# Patient Record
Sex: Female | Born: 1960
Health system: Southern US, Community
[De-identification: ages and names within clinical notes are randomized; demographics above are authoritative.]

## PROBLEM LIST (undated history)

## (undated) DIAGNOSIS — R011 Cardiac murmur, unspecified: Secondary | ICD-10-CM

## (undated) DIAGNOSIS — C8290 Follicular lymphoma, unspecified, unspecified site: Secondary | ICD-10-CM

## (undated) DIAGNOSIS — R51 Headache: Secondary | ICD-10-CM

## (undated) DIAGNOSIS — K115 Sialolithiasis: Secondary | ICD-10-CM

## (undated) DIAGNOSIS — F17200 Nicotine dependence, unspecified, uncomplicated: Secondary | ICD-10-CM

## (undated) DIAGNOSIS — C8590 Non-Hodgkin lymphoma, unspecified, unspecified site: Secondary | ICD-10-CM

## (undated) DIAGNOSIS — M810 Age-related osteoporosis without current pathological fracture: Secondary | ICD-10-CM

## (undated) DIAGNOSIS — D369 Benign neoplasm, unspecified site: Secondary | ICD-10-CM

## (undated) DIAGNOSIS — R519 Headache, unspecified: Secondary | ICD-10-CM

## (undated) DIAGNOSIS — C801 Malignant (primary) neoplasm, unspecified: Secondary | ICD-10-CM

## (undated) DIAGNOSIS — Z8679 Personal history of other diseases of the circulatory system: Secondary | ICD-10-CM

## (undated) DIAGNOSIS — Z9884 Bariatric surgery status: Secondary | ICD-10-CM

## (undated) HISTORY — DX: Personal history of other diseases of the circulatory system: Z86.79

## (undated) HISTORY — DX: Headache, unspecified: R51.9

## (undated) HISTORY — DX: Sialolithiasis: K11.5

## (undated) HISTORY — PX: REDUCTION MAMMAPLASTY: SUR839

## (undated) HISTORY — DX: Headache: R51

## (undated) HISTORY — PX: CHOLECYSTECTOMY: SHX55

## (undated) HISTORY — PX: SALIVARY GLAND SURGERY: SHX768

## (undated) HISTORY — DX: Benign neoplasm, unspecified site: D36.9

## (undated) HISTORY — DX: Age-related osteoporosis without current pathological fracture: M81.0

## (undated) HISTORY — DX: Nicotine dependence, unspecified, uncomplicated: F17.200

## (undated) HISTORY — DX: Non-Hodgkin lymphoma, unspecified, unspecified site: C85.90

## (undated) HISTORY — PX: SALIVARY STONE REMOVAL: SHX5213

## (undated) HISTORY — PX: BILATERAL OOPHORECTOMY: SHX1221

## (undated) HISTORY — DX: Cardiac murmur, unspecified: R01.1

## (undated) HISTORY — PX: ABDOMINAL HYSTERECTOMY: SHX81

## (undated) HISTORY — DX: Bariatric surgery status: Z98.84

---
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
## 2002-01-31 DIAGNOSIS — Z9884 Bariatric surgery status: Secondary | ICD-10-CM | POA: Insufficient documentation

## 2002-01-31 HISTORY — DX: Bariatric surgery status: Z98.84

## 2002-01-31 HISTORY — PX: GASTRIC BYPASS: SHX52

## 2011-07-08 ENCOUNTER — Ambulatory Visit: Payer: Self-pay | Admitting: Family Medicine

## 2011-07-13 ENCOUNTER — Encounter: Payer: Self-pay | Admitting: Family Medicine

## 2011-07-13 ENCOUNTER — Ambulatory Visit (INDEPENDENT_AMBULATORY_CARE_PROVIDER_SITE_OTHER): Payer: BC Managed Care – PPO | Admitting: Family Medicine

## 2011-07-13 ENCOUNTER — Encounter: Payer: Self-pay | Admitting: *Deleted

## 2011-07-13 VITALS — BP 128/78 | HR 80 | Temp 98.7°F | Ht 66.0 in | Wt 178.8 lb

## 2011-07-13 DIAGNOSIS — Z9884 Bariatric surgery status: Secondary | ICD-10-CM | POA: Insufficient documentation

## 2011-07-13 DIAGNOSIS — Z0000 Encounter for general adult medical examination without abnormal findings: Secondary | ICD-10-CM

## 2011-07-13 DIAGNOSIS — F17200 Nicotine dependence, unspecified, uncomplicated: Secondary | ICD-10-CM

## 2011-07-13 DIAGNOSIS — Z87891 Personal history of nicotine dependence: Secondary | ICD-10-CM | POA: Insufficient documentation

## 2011-07-13 LAB — CBC WITH DIFFERENTIAL/PLATELET
Basophils Absolute: 0 10*3/uL (ref 0.0–0.1)
Basophils Relative: 0.4 % (ref 0.0–3.0)
Eosinophils Absolute: 0.1 10*3/uL (ref 0.0–0.7)
Eosinophils Relative: 1.5 % (ref 0.0–5.0)
HCT: 41.4 % (ref 36.0–46.0)
Hemoglobin: 13.7 g/dL (ref 12.0–15.0)
Lymphocytes Relative: 34.8 % (ref 12.0–46.0)
Lymphs Abs: 2 10*3/uL (ref 0.7–4.0)
MCHC: 33.1 g/dL (ref 30.0–36.0)
MCV: 88.3 fl (ref 78.0–100.0)
Monocytes Absolute: 0.4 10*3/uL (ref 0.1–1.0)
Monocytes Relative: 7.8 % (ref 3.0–12.0)
Neutro Abs: 3.1 10*3/uL (ref 1.4–7.7)
Neutrophils Relative %: 55.5 % (ref 43.0–77.0)
Platelets: 185 10*3/uL (ref 150.0–400.0)
RBC: 4.69 Mil/uL (ref 3.87–5.11)
RDW: 15.1 % — ABNORMAL HIGH (ref 11.5–14.6)
WBC: 5.7 10*3/uL (ref 4.5–10.5)

## 2011-07-13 LAB — COMPREHENSIVE METABOLIC PANEL
ALT: 28 U/L (ref 0–35)
AST: 25 U/L (ref 0–37)
Albumin: 4.1 g/dL (ref 3.5–5.2)
Alkaline Phosphatase: 77 U/L (ref 39–117)
BUN: 15 mg/dL (ref 6–23)
CO2: 30 mEq/L (ref 19–32)
Calcium: 9.4 mg/dL (ref 8.4–10.5)
Chloride: 104 mEq/L (ref 96–112)
Creatinine, Ser: 0.7 mg/dL (ref 0.4–1.2)
GFR: 96.71 mL/min (ref >60.00)
Glucose, Bld: 76 mg/dL (ref 70–99)
Potassium: 4.7 mEq/L (ref 3.5–5.1)
Sodium: 140 mEq/L (ref 135–145)
Total Bilirubin: 0.9 mg/dL (ref 0.3–1.2)
Total Protein: 6.6 g/dL (ref 6.0–8.3)

## 2011-07-13 LAB — LIPID PANEL
Cholesterol: 136 mg/dL (ref 0–200)
HDL: 58.7 mg/dL (ref >39.00)
LDL Cholesterol: 65 mg/dL (ref 0–99)
Total CHOL/HDL Ratio: 2
Triglycerides: 60 mg/dL (ref 0.0–149.0)
VLDL: 12 mg/dL (ref 0.0–40.0)

## 2011-07-13 LAB — FOLATE: Folate: 15 ng/mL (ref >5.9)

## 2011-07-13 LAB — IBC PANEL
Iron: 136 ug/dL (ref 42–145)
Saturation Ratios: 30.5 % (ref 20.0–50.0)
Transferrin: 318.1 mg/dL (ref 212.0–360.0)

## 2011-07-13 LAB — TSH: TSH: 1.67 u[IU]/mL (ref 0.35–5.50)

## 2011-07-13 LAB — VITAMIN B12: Vitamin B-12: 366 pg/mL (ref 211–911)

## 2011-07-13 LAB — FERRITIN: Ferritin: 5.2 ng/mL — ABNORMAL LOW (ref 10.0–291.0)

## 2011-07-13 NOTE — Assessment & Plan Note (Signed)
Discussed importance of cessation  

## 2011-07-13 NOTE — Patient Instructions (Signed)
Gusto concerla hoy. vamos a revisar sangre hoy. haga cita con gynecologo para exam de mujer. regrese cuando necesite o en 1 ano para proximo chequeo

## 2011-07-13 NOTE — Progress Notes (Signed)
Subjective:    Patient ID: Melinda Hall, female    DOB: March 18, 1960, 51 y.o.   MRN: [id number]  HPI CC: new pt to establish  No questions or concerns.  Feels well, good energy level.  Sleeps on average 5-6 hours nightly.  Not a lot of caffeine.  S/p gastric bypass.  Weight change from 278 to 170lbs.  Has vitamins at home, doesn't take.  Smoking - 3 cig/day.  Quit smoking for 4 yrs in past.  Contemplative.  Caffeine: 2 cups coffee/day Lives with husband Jeffie Pollock) and daughter, 3 dogs, 1 ferret Occupation: Geologist, engineering at daycare Activity: no regular exercise, walks some at work Diet: good water, fruits/vegetables daily  Preventative: Well woman with OBGYN.  Needs to establish. Physical 3 mo ago, did not have blood checked.  Had done at [facility].  Due for blood work, fasting today. Pap - 2012 mammo - 2011 normal Tetanus thinks about 5 yrs ago (2008)  Medications and allergies reviewed and updated in chart.  Past histories reviewed and updated if relevant as below. There is no problem list on file for this patient.  Past Medical History  Diagnosis Date  . Heart murmur   . Generalized headaches     frequent   Past Surgical History  Procedure Date  . Partial hysterectomy   . Cholecystectomy   . Gastric bypass   . Appendectomy    History  Substance Use Topics  . Smoking status: Current Everyday Smoker    Types: Cigarettes  . Smokeless tobacco: Not on file   Comment: 2-3 cigarettes daily  . Alcohol Use: No   No family history on file. No Known Allergies No current outpatient prescriptions on file prior to visit.     Review of Systems  Constitutional: Negative for fever, chills, activity change, appetite change, fatigue and unexpected weight change.  HENT: Negative for hearing loss and neck pain.   Eyes: Negative for visual disturbance.  Respiratory: Negative for cough, chest tightness, shortness of breath and wheezing.   Cardiovascular: Negative for chest pain,  palpitations and leg swelling.  Gastrointestinal: Negative for nausea, vomiting, abdominal pain, diarrhea, constipation, blood in stool and abdominal distention.  Genitourinary: Negative for hematuria and difficulty urinating.  Musculoskeletal: Negative for myalgias and arthralgias.  Skin: Negative for rash.  Neurological: Positive for headaches. Negative for dizziness, seizures and syncope.  Hematological: Does not bruise/bleed easily.  Psychiatric/Behavioral: Negative for dysphoric mood. The patient is nervous/anxious (very mild).        Objective:   Physical Exam  Nursing note and vitals reviewed. Constitutional: She is oriented to person, place, and time. She appears well-developed and well-nourished. No distress.  HENT:  Head: Normocephalic and atraumatic.  Right Ear: Hearing, tympanic membrane, external ear and ear canal normal.  Left Ear: Hearing, tympanic membrane, external ear and ear canal normal.  Nose: Nose normal.  Mouth/Throat: Oropharynx is clear and moist. No oropharyngeal exudate.  Eyes: Conjunctivae and EOM are normal. Pupils are equal, round, and reactive to light. No scleral icterus.  Neck: Normal range of motion. Neck supple. Carotid bruit is not present. No thyromegaly present.  Cardiovascular: Normal rate, regular rhythm, normal heart sounds and intact distal pulses.   No murmur (none appreciated today) heard. Pulses:      Radial pulses are 2+ on the right side, and 2+ on the left side.  Pulmonary/Chest: Effort normal and breath sounds normal. No respiratory distress. She has no wheezes. She has no rales.  Abdominal: Soft. Bowel sounds are  normal. She exhibits no distension and no mass. There is no tenderness. There is no rebound and no guarding.  Musculoskeletal: Normal range of motion. She exhibits no edema.  Lymphadenopathy:    She has no cervical adenopathy.  Neurological: She is alert and oriented to person, place, and time.       CN grossly intact,  station and gait intact  Skin: Skin is warm and dry. No rash noted.  Psychiatric: She has a normal mood and affect. Her behavior is normal. Judgment and thought content normal.       Assessment & Plan:

## 2011-07-13 NOTE — Assessment & Plan Note (Signed)
Due for blood work.  Check vitamin levels. Encouraged taking vitamins.

## 2011-07-13 NOTE — Assessment & Plan Note (Signed)
Recent CPE at [facility], however did not have labwork drawn.  Will check today as fasting.  To schedule well woman with OBGYN.

## 2011-07-14 ENCOUNTER — Other Ambulatory Visit: Payer: Self-pay | Admitting: Family Medicine

## 2011-07-14 LAB — VITAMIN D 25 HYDROXY (VIT D DEFICIENCY, FRACTURES): Vit D, 25-Hydroxy: 23 ng/mL — ABNORMAL LOW (ref 30–89)

## 2011-07-14 MED ORDER — FERROUS SULFATE 325 (65 FE) MG PO TABS: 325.0000 mg | ORAL_TABLET | Freq: Every day | ORAL | Status: DC

## 2011-07-14 MED ORDER — ERGOCALCIFEROL 1.25 MG (50000 UT) PO CAPS: 50000.0000 [IU] | ORAL_CAPSULE | ORAL | Status: DC

## 2012-01-17 ENCOUNTER — Ambulatory Visit (INDEPENDENT_AMBULATORY_CARE_PROVIDER_SITE_OTHER): Payer: BC Managed Care – PPO | Admitting: *Deleted

## 2012-01-17 DIAGNOSIS — Z23 Encounter for immunization: Secondary | ICD-10-CM

## 2012-02-24 ENCOUNTER — Other Ambulatory Visit (INDEPENDENT_AMBULATORY_CARE_PROVIDER_SITE_OTHER): Payer: BC Managed Care – PPO

## 2012-02-24 ENCOUNTER — Other Ambulatory Visit: Payer: Self-pay | Admitting: Family Medicine

## 2012-02-24 DIAGNOSIS — Z9884 Bariatric surgery status: Secondary | ICD-10-CM

## 2012-02-24 LAB — FERRITIN: Ferritin: 4.2 ng/mL — ABNORMAL LOW (ref 10.0–291.0)

## 2012-02-25 LAB — VITAMIN D 25 HYDROXY (VIT D DEFICIENCY, FRACTURES): Vit D, 25-Hydroxy: 24 ng/mL — ABNORMAL LOW (ref 30–89)

## 2012-02-28 ENCOUNTER — Encounter: Payer: Self-pay | Admitting: Family Medicine

## 2012-02-28 ENCOUNTER — Ambulatory Visit (INDEPENDENT_AMBULATORY_CARE_PROVIDER_SITE_OTHER): Payer: BC Managed Care – PPO | Admitting: Family Medicine

## 2012-02-28 VITALS — BP 110/74 | HR 60 | Temp 98.6°F | Ht 66.0 in | Wt 189.5 lb

## 2012-02-28 DIAGNOSIS — E611 Iron deficiency: Secondary | ICD-10-CM

## 2012-02-28 DIAGNOSIS — Z9884 Bariatric surgery status: Secondary | ICD-10-CM

## 2012-02-28 DIAGNOSIS — Z0000 Encounter for general adult medical examination without abnormal findings: Secondary | ICD-10-CM

## 2012-02-28 DIAGNOSIS — F17200 Nicotine dependence, unspecified, uncomplicated: Secondary | ICD-10-CM

## 2012-02-28 DIAGNOSIS — Z01419 Encounter for gynecological examination (general) (routine) without abnormal findings: Secondary | ICD-10-CM

## 2012-02-28 DIAGNOSIS — Z1211 Encounter for screening for malignant neoplasm of colon: Secondary | ICD-10-CM

## 2012-02-28 DIAGNOSIS — D509 Iron deficiency anemia, unspecified: Secondary | ICD-10-CM

## 2012-02-28 DIAGNOSIS — Z1231 Encounter for screening mammogram for malignant neoplasm of breast: Secondary | ICD-10-CM

## 2012-02-28 MED ORDER — ERGOCALCIFEROL 1.25 MG (50000 UT) PO CAPS: 50000.0000 [IU] | ORAL_CAPSULE | ORAL | Status: DC

## 2012-02-28 MED ORDER — FERROUS SULFATE 325 (65 FE) MG PO TABS: 325.0000 mg | ORAL_TABLET | Freq: Two times a day (BID) | ORAL | Status: DC

## 2012-02-28 MED ORDER — CHOLECALCIFEROL 50 MCG (2000 UT) PO CAPS: 1.0000 | ORAL_CAPSULE | Freq: Every day | ORAL | Status: DC

## 2012-02-28 NOTE — Assessment & Plan Note (Signed)
Without anemia. Recommend increased iron to bid. If remains with poor absorption, consider iron infusion vs heme.

## 2012-02-28 NOTE — Progress Notes (Signed)
Subjective:    Patient ID: Melinda Hall, female    DOB: 08-23-1960, 52 y.o.   MRN: [id number]  HPI CC: CPE  Having bilateral lateral hip joint pains, worse at night time, sometimes wakes her up from sleep.  Repositioning improves pain.  No pain with walking.  Smoking - a few a day.  Occasional depressed mood - does not want pharmacotherapy.  Trouble adjusting to [place] (prior had good friends in [place]).    Has had bone density stan 3-4 yrs ago and told normal. S/p gastric bypass 2005.  Taking iron 1 pill daily.  Wt Readings from Last 3 Encounters:  02/28/12 189 lb 8 oz (85.957 kg)  07/13/11 178 lb 12 oz (81.08 kg)    Caffeine: 2 cups coffee/day  Lives with husband Jeffie Pollock) and daughter, 3 dogs, 1 ferret Occupation: Geologist, engineering at daycare  Activity: no regular exercise, walks some at work  Diet: good water, fruits/vegetables daily   Preventative:  Well woman with OBGYN.  Would like to establish with spanish speaking OBGYN.  S/p bilateral oophorectomy for dermoid cysts. Pap - 2012  Colonoscopy - ifob requested. mammo - 2011 normal . Would like [place] to set up ([place]). Tetanus thinks about 5 yrs ago (2008)  Medications and allergies reviewed and updated in chart.  Past histories reviewed and updated if relevant as below. Patient Active Problem List  Diagnosis  . Status post gastric bypass for obesity  . Smoker  . Healthcare maintenance   Past Medical History  Diagnosis Date  . Heart murmur   . Generalized headaches     frequent  . Gastric bypass status for obesity 2004    s/p roux-en-y  . Smoker    Past Surgical History  Procedure Date  . Bilateral oophorectomy ~1992    for dermoid ovarian cysts  . Cholecystectomy 2000s  . Gastric bypass 2004    roux en y [facility])  . Appendectomy 1980   History  Substance Use Topics  . Smoking status: Current Every Day Smoker    Types: Cigarettes  . Smokeless tobacco: Never Used     Comment: 1-2  cigarettes daily  . Alcohol Use: No   Family History  Problem Relation Age of Onset  . Cancer Father 5    leukemia  . Hypertension Mother   . Diabetes Neg Hx   . Stroke Neg Hx   . Coronary artery disease Neg Hx    No Known Allergies Current Outpatient Prescriptions on File Prior to Visit  Medication Sig Dispense Refill  . ferrous sulfate 325 (65 FE) MG tablet Take 1 tablet (325 mg total) by mouth daily with breakfast.         Review of Systems  Constitutional: Negative for fever, chills, activity change, appetite change, fatigue and unexpected weight change.  HENT: Negative for hearing loss and neck pain.   Eyes: Negative for visual disturbance.  Respiratory: Negative for cough, chest tightness, shortness of breath and wheezing.   Cardiovascular: Negative for chest pain, palpitations and leg swelling.  Gastrointestinal: Negative for nausea, vomiting, abdominal pain, diarrhea, constipation, blood in stool and abdominal distention.  Genitourinary: Negative for hematuria and difficulty urinating.  Musculoskeletal: Negative for myalgias and arthralgias.  Skin: Negative for rash.  Neurological: Negative for dizziness, seizures, syncope and headaches.  Hematological: Does not bruise/bleed easily.  Psychiatric/Behavioral: Negative for dysphoric mood. The patient is not nervous/anxious.        Objective:   Physical Exam  Nursing note and vitals reviewed.  Constitutional: She is oriented to person, place, and time. She appears well-developed and well-nourished. No distress.  HENT:  Head: Normocephalic and atraumatic.  Right Ear: Hearing, tympanic membrane, external ear and ear canal normal.  Left Ear: Hearing, tympanic membrane, external ear and ear canal normal.  Nose: Nose normal. No mucosal edema or rhinorrhea. Right sinus exhibits no maxillary sinus tenderness and no frontal sinus tenderness. Left sinus exhibits no maxillary sinus tenderness and no frontal sinus tenderness.    Mouth/Throat: Uvula is midline, oropharynx is clear and moist and mucous membranes are normal. No oropharyngeal exudate, posterior oropharyngeal edema, posterior oropharyngeal erythema or tonsillar abscesses.  Eyes: Conjunctivae normal and EOM are normal. Pupils are equal, round, and reactive to light. No scleral icterus.  Neck: Normal range of motion. Neck supple. Carotid bruit is not present. No thyromegaly present.  Cardiovascular: Normal rate, regular rhythm, normal heart sounds and intact distal pulses.   No murmur heard. Pulses:      Radial pulses are 2+ on the right side, and 2+ on the left side.  Pulmonary/Chest: Effort normal and breath sounds normal. No respiratory distress. She has no wheezes. She has no rales.  Abdominal: Soft. Bowel sounds are normal. She exhibits no distension and no mass. There is no tenderness. There is no rebound and no guarding.  Musculoskeletal: Normal range of motion. She exhibits no edema.  Lymphadenopathy:    She has no cervical adenopathy.  Neurological: She is alert and oriented to person, place, and time.       CN grossly intact, station and gait intact  Skin: Skin is warm and dry. No rash noted.  Psychiatric: She has a normal mood and affect. Her behavior is normal. Judgment and thought content normal.       Assessment & Plan:

## 2012-02-28 NOTE — Assessment & Plan Note (Addendum)
Preventative protocols reviewed and updated unless pt declined. Discussed healthy diet and lifestyle. Refer to OBGYN for well woman, pt prefers spanish speaking, suggested Dr Lily Peer

## 2012-02-28 NOTE — Assessment & Plan Note (Signed)
With some iron def and vit d def. Recommend restart supplemnetation with vit D and increase iron to bid.

## 2012-02-28 NOTE — Assessment & Plan Note (Signed)
Again encouraged complete cessation.

## 2012-02-28 NOTE — Patient Instructions (Addendum)
Pass by [facility]'s office to establish with GYN for well woman (Dr. Lily Peer) and mammogram [place] por donde Shirlee Limerick para hacer cita con ginecologo y [place]. Empieze vitamina D 50,000 unidades semanales por 8 semanas, luego comineze vitamina D 2000 unidades diarias (over the counter) [place] cantidad de hierro a 1 [employer] a la semana. Gusto verla hoy, llamenos con preguntas.

## 2012-03-08 ENCOUNTER — Ambulatory Visit: Payer: BC Managed Care – PPO | Admitting: Gynecology

## 2012-03-20 ENCOUNTER — Ambulatory Visit: Payer: Self-pay | Admitting: Family Medicine

## 2012-03-27 ENCOUNTER — Encounter: Payer: Self-pay | Admitting: Family Medicine

## 2012-03-28 ENCOUNTER — Encounter: Payer: Self-pay | Admitting: *Deleted

## 2012-10-29 ENCOUNTER — Ambulatory Visit (INDEPENDENT_AMBULATORY_CARE_PROVIDER_SITE_OTHER): Payer: BC Managed Care – PPO | Admitting: Family Medicine

## 2012-10-29 ENCOUNTER — Encounter: Payer: Self-pay | Admitting: Family Medicine

## 2012-10-29 VITALS — BP 150/96 | HR 68 | Temp 98.6°F | Wt 191.2 lb

## 2012-10-29 DIAGNOSIS — R002 Palpitations: Secondary | ICD-10-CM

## 2012-10-29 DIAGNOSIS — Z23 Encounter for immunization: Secondary | ICD-10-CM

## 2012-10-29 DIAGNOSIS — I10 Essential (primary) hypertension: Secondary | ICD-10-CM

## 2012-10-29 NOTE — Patient Instructions (Signed)
EKG ok today. Blood work today as well as urine test today. We will call you with results. si todo sale normal, probablemente recomendare que vea cardiologo para monitor

## 2012-10-29 NOTE — Assessment & Plan Note (Signed)
Palpitations described as skipped heart beats and loud heart beats - ?arrhythmia. Checked EKG today - NSR rate 60s, normal axis, intervals, no hypertrophy or acute ST/T changes.  ?isolated T wave inversion Palpitations associated with hypertension, lightheadedness, and chills/ sweats.  Will check urine metanephrine to help r/o pheo. Check TSH, lytes and CBC. If unrevealing w/u, will refer to cards for discussion of event monitor to capture ?inciting arrythmia.  Pt agrees with plan. No meds started today.

## 2012-10-29 NOTE — Progress Notes (Signed)
  Subjective:    Patient ID: Melinda Hall, female    DOB: September 06, 1960, 52 y.o.   MRN: [id number]  HPI CC: malaise  Pleasant 52 yo s/p gastric bypass presents today with 3 wk h/o feeling ill - having episodes associated with palpitations, cold sweat, and shaking.  Palpitations described as occasional skipped beats (as measured by daughter who is firefighter) and stronger heart beats, not necessarily rapid heart beats.  Some dyspnea and dizziness associated with this.  No syncope with this.  This lasts 5-10 minutes then resolves.  H/o these episodes in years past, but recently much more frequent.  BP only slightly elevated when checked but hasn't checked specifically during these episodes.    Denies NSAID use. Drinks 2 cups coffee/day, no other caffeinated beverage (drinks decaf sweet tea).  Unsure if anxiety or stress related.  No h/o anxiety in past.  On increased stress at home or work. No h/o HTN. No chest pain, headaches with these episodes.  Some hip pain as well.  BP Readings from Last 3 Encounters:  10/29/12 150/96  02/28/12 110/74  07/13/11 128/78    Past Medical History  Diagnosis Date  . Heart murmur   . Generalized headaches     frequent  . Gastric bypass status for obesity 2004    s/p roux-en-y  . Smoker     Past Surgical History  Procedure Laterality Date  . Bilateral oophorectomy  ~1992    for dermoid ovarian cysts  . Cholecystectomy  2000s  . Gastric bypass  2004    roux en y [facility])  . Appendectomy  1980    Review of Systems Per HPI    Objective:   Physical Exam  Nursing note and vitals reviewed. Constitutional: She appears well-developed and well-nourished. No distress.  HENT:  Head: Normocephalic and atraumatic.  Mouth/Throat: Oropharynx is clear and moist. No oropharyngeal exudate.  Eyes: Conjunctivae and EOM are normal. Pupils are equal, round, and reactive to light.  Neck: Normal range of motion. Neck supple. Carotid bruit is not present. No  thyromegaly present.  Cardiovascular: Normal rate, regular rhythm, normal heart sounds and intact distal pulses.   No murmur heard. No murmur appreciated today  Pulmonary/Chest: Effort normal and breath sounds normal. No respiratory distress. She has no wheezes. She has no rales.  Musculoskeletal: She exhibits no edema.  Lymphadenopathy:    She has no cervical adenopathy.  Psychiatric: She has a normal mood and affect.       Assessment & Plan:

## 2012-10-30 ENCOUNTER — Encounter: Payer: Self-pay | Admitting: *Deleted

## 2012-10-30 LAB — CBC WITH DIFFERENTIAL/PLATELET
Basophils Absolute: 0 10*3/uL (ref 0.0–0.1)
Basophils Relative: 0.4 % (ref 0.0–3.0)
Eosinophils Absolute: 0.2 10*3/uL (ref 0.0–0.7)
Eosinophils Relative: 2 % (ref 0.0–5.0)
HCT: 38 % (ref 36.0–46.0)
Hemoglobin: 12.6 g/dL (ref 12.0–15.0)
Lymphocytes Relative: 31.3 % (ref 12.0–46.0)
Lymphs Abs: 2.4 10*3/uL (ref 0.7–4.0)
MCHC: 33.1 g/dL (ref 30.0–36.0)
MCV: 84.9 fl (ref 78.0–100.0)
Monocytes Absolute: 0.5 10*3/uL (ref 0.1–1.0)
Monocytes Relative: 6 % (ref 3.0–12.0)
Neutro Abs: 4.5 10*3/uL (ref 1.4–7.7)
Neutrophils Relative %: 60.3 % (ref 43.0–77.0)
Platelets: 231 10*3/uL (ref 150.0–400.0)
RBC: 4.48 Mil/uL (ref 3.87–5.11)
RDW: 15.4 % — ABNORMAL HIGH (ref 11.5–14.6)
WBC: 7.5 10*3/uL (ref 4.5–10.5)

## 2012-10-30 LAB — COMPREHENSIVE METABOLIC PANEL
ALT: 23 U/L (ref 0–35)
AST: 22 U/L (ref 0–37)
Albumin: 4 g/dL (ref 3.5–5.2)
Alkaline Phosphatase: 71 U/L (ref 39–117)
BUN: 15 mg/dL (ref 6–23)
CO2: 28 mEq/L (ref 19–32)
Calcium: 9.2 mg/dL (ref 8.4–10.5)
Chloride: 106 mEq/L (ref 96–112)
Creatinine, Ser: 0.7 mg/dL (ref 0.4–1.2)
GFR: 101.36 mL/min (ref >60.00)
Glucose, Bld: 83 mg/dL (ref 70–99)
Potassium: 4.7 mEq/L (ref 3.5–5.1)
Sodium: 138 mEq/L (ref 135–145)
Total Bilirubin: 0.6 mg/dL (ref 0.3–1.2)
Total Protein: 7 g/dL (ref 6.0–8.3)

## 2012-10-30 LAB — TSH: TSH: 2.24 u[IU]/mL (ref 0.35–5.50)

## 2012-10-30 NOTE — Addendum Note (Signed)
Addended by: Annamarie Major on: 10/30/2012 12:19 PM   Modules accepted: Orders

## 2012-11-02 ENCOUNTER — Telehealth: Payer: Self-pay

## 2012-11-02 NOTE — Telephone Encounter (Signed)
pts husband called to request 10/30/12 lab results. Mr Farewell notified as instructed from lab result note and pt will receive letter about lab results.Mr Hobart voiced understanding.

## 2012-11-05 ENCOUNTER — Other Ambulatory Visit (INDEPENDENT_AMBULATORY_CARE_PROVIDER_SITE_OTHER): Payer: BC Managed Care – PPO

## 2012-11-05 DIAGNOSIS — R002 Palpitations: Secondary | ICD-10-CM

## 2012-11-05 DIAGNOSIS — I10 Essential (primary) hypertension: Secondary | ICD-10-CM

## 2012-11-10 LAB — METANEPHRINES, URINE, 24 HOUR
Metaneph Total, Ur: 562 mcg/24 h (ref 224–832)
Metanephrines, Ur: 37 mcg/24 h — ABNORMAL LOW (ref 90–315)
Normetanephrine, 24H Ur: 525 mcg/24 h (ref 122–676)

## 2012-11-11 ENCOUNTER — Other Ambulatory Visit: Payer: Self-pay | Admitting: Family Medicine

## 2012-11-11 DIAGNOSIS — R002 Palpitations: Secondary | ICD-10-CM

## 2012-11-23 ENCOUNTER — Encounter: Payer: Self-pay | Admitting: Cardiovascular Disease

## 2012-11-23 ENCOUNTER — Ambulatory Visit (INDEPENDENT_AMBULATORY_CARE_PROVIDER_SITE_OTHER): Payer: BC Managed Care – PPO | Admitting: Cardiovascular Disease

## 2012-11-23 VITALS — BP 110/82 | HR 55 | Ht 66.0 in | Wt 188.5 lb

## 2012-11-23 DIAGNOSIS — R55 Syncope and collapse: Secondary | ICD-10-CM

## 2012-11-23 DIAGNOSIS — F17200 Nicotine dependence, unspecified, uncomplicated: Secondary | ICD-10-CM

## 2012-11-23 DIAGNOSIS — R000 Tachycardia, unspecified: Secondary | ICD-10-CM

## 2012-11-23 DIAGNOSIS — R002 Palpitations: Secondary | ICD-10-CM

## 2012-11-23 NOTE — Assessment & Plan Note (Addendum)
Etiology of her symptomatic episodes concerning for arrhythmia. We have discussed various arrhythmias with her. As her rhythm may be more regular as she describes, less likely atrial fibrillation though not definitive . Symptoms come on acutely, resolve acutely. Lasting 10-30 minutes. Been on a regular basis. We have suggested she wear a 30 day monitor. I suspect a 48 hour monitor would likely miss her arrhythmia. It may be a challenge to treat given her current bradycardia at baseline. If she has episodes for the monitor is in place, we have suggested she go to the EMTs or call 911 for EKG. clinically, no suggestion of pathology. I'm concerned about minor EKG abnormality in the inferior leads. No symptoms at baseline

## 2012-11-23 NOTE — Patient Instructions (Signed)
You are doing well. No medication changes were made.  We will order a 30 day monitor. This will be sent to your house if approved.  Please call us if you have new issues that need to be addressed before your next appt.  Your physician wants you to follow-up in: 6 weeks

## 2012-11-23 NOTE — Progress Notes (Signed)
Patient ID: Melinda Hall, female    DOB: [birth year], 52 y.o.   MRN: [id number]  HPI Comments: Melinda Hall is a 52 year old woman with long smoking history for may 13 to age 74, less than one half pack per day who presents by referral from Dr. Sharen Hones with symptoms of palpitations, weakness, lightheadedness associated with cold sweats, near syncope.  She reports that she was doing well until 3 or 4 weeks ago. She was sitting watching TV when she had acute onset of pounding heart rate. Symptoms lasted for 30 minutes, she was very symptomatic. She describes it as a fast, heart rhythm. Heart rates at one point were measured at 80 beats per minute but she could not be sure it was not faster. She had some lightheadedness, felt very weak. Also had some cold sweats. Symptoms resolved acutely as fast as they have come on after 30 minutes.  She had a second and third episode one was in a car while she was sitting lasting for 10 minutes. Acute onset again associated with weakness, lightheadedness, cold sweats.  Third episode lasting 5 or 10 minutes while she was standing. She had to sit down as she was very lightheaded/near syncope and felt very weak. All 3 episodes came on acutely and resolved acutely. Each episode seemed to be associated with a pounding heart rate which felt very strong, regular. She did not feel that they were irregular.  Otherwise she has felt well since then. Active, no complaints. No chest pain or shortness of breath.  EKG today shows sinus bradycardia with rate 55 beats a minute with ST and T wave abnormality in leads 3, aVF (seen as well on recent readings EKG)    Outpatient Encounter Prescriptions as of 11/23/2012  Medication Sig Dispense Refill  . Cholecalciferol (HM VITAMIN D3) 2000 UNITS CAPS Take 1 capsule (2,000 Units total) by mouth daily.  30 each    . ferrous sulfate 325 (65 FE) MG tablet Take 1 tablet (325 mg total) by mouth 2 (two) times daily.       No  facility-administered encounter medications on file as of 11/23/2012.     Review of Systems  Constitutional:       Diaphoresis, heart pounding associated with her episodes with near syncope  HENT: Negative.   Eyes: Negative.   Respiratory: Negative.   Cardiovascular: Positive for palpitations.  Gastrointestinal: Negative.   Endocrine: Negative.   Musculoskeletal: Negative.   Skin: Negative.   Allergic/Immunologic: Negative.   Neurological: Positive for weakness.  Hematological: Negative.   Psychiatric/Behavioral: Negative.   All other systems reviewed and are negative.    BP 110/82  Pulse 55  Ht 5\' 6"  (1.676 m)  Wt 188 lb 8 oz (85.503 kg)  BMI 30.44 kg/m2   Physical Exam  Nursing note and vitals reviewed. Constitutional: She is oriented to person, place, and time. She appears well-developed and well-nourished.  HENT:  Head: Normocephalic.  Nose: Nose normal.  Mouth/Throat: Oropharynx is clear and moist.  Eyes: Conjunctivae are normal. Pupils are equal, round, and reactive to light.  Neck: Normal range of motion. Neck supple. No JVD present.  Cardiovascular: Normal rate, regular rhythm, S1 normal, S2 normal, normal heart sounds and intact distal pulses.  Exam reveals no gallop and no friction rub.   No murmur heard. Pulmonary/Chest: Effort normal and breath sounds normal. No respiratory distress. She has no wheezes. She has no rales. She exhibits no tenderness.  Abdominal: Soft. Bowel sounds are normal. She exhibits  no distension. There is no tenderness.  Musculoskeletal: Normal range of motion. She exhibits no edema and no tenderness.  Lymphadenopathy:    She has no cervical adenopathy.  Neurological: She is alert and oriented to person, place, and time. Coordination normal.  Skin: Skin is warm and dry. No rash noted. No erythema.  Psychiatric: She has a normal mood and affect. Her behavior is normal. Judgment and thought content normal.    Assessment and Plan

## 2012-11-23 NOTE — Assessment & Plan Note (Signed)
We have encouraged her to continue to work on weaning her cigarettes and smoking cessation. She will continue to work on this and does not want any assistance with chantix.  

## 2012-11-28 DIAGNOSIS — R002 Palpitations: Secondary | ICD-10-CM

## 2013-01-04 ENCOUNTER — Ambulatory Visit (INDEPENDENT_AMBULATORY_CARE_PROVIDER_SITE_OTHER): Payer: BC Managed Care – PPO | Admitting: Cardiovascular Disease

## 2013-01-04 ENCOUNTER — Encounter: Payer: Self-pay | Admitting: Cardiovascular Disease

## 2013-01-04 VITALS — BP 100/80 | HR 52 | Ht 66.0 in | Wt 189.5 lb

## 2013-01-04 DIAGNOSIS — F17200 Nicotine dependence, unspecified, uncomplicated: Secondary | ICD-10-CM

## 2013-01-04 DIAGNOSIS — R002 Palpitations: Secondary | ICD-10-CM

## 2013-01-04 DIAGNOSIS — R0602 Shortness of breath: Secondary | ICD-10-CM

## 2013-01-04 DIAGNOSIS — Z9884 Bariatric surgery status: Secondary | ICD-10-CM

## 2013-01-04 MED ORDER — PROPRANOLOL HCL 10 MG PO TABS: 10.0000 mg | ORAL_TABLET | Freq: Three times a day (TID) | ORAL | Status: DC | PRN

## 2013-01-04 NOTE — Assessment & Plan Note (Signed)
We have congratulated her on smoking cessation

## 2013-01-04 NOTE — Patient Instructions (Signed)
You are doing well. Please take propranolol as needed for palpitations You are having premature extra beats (atrial beats) These will not hurt you, just bothersome  Please call us if you have new issues that need to be addressed before your next appt.

## 2013-01-04 NOTE — Assessment & Plan Note (Signed)
We have encouraged continued exercise, careful diet management in an effort to lose weight. 

## 2013-01-04 NOTE — Assessment & Plan Note (Signed)
a rare APCs seen on 30 day monitor. She is symptomatic at times, not daily. Sometimes once per month. We will give her propranolol 10 mg to take when necessary for symptoms

## 2013-01-04 NOTE — Progress Notes (Signed)
   Patient ID: Melinda Hall, female    DOB: [birth year], 52 y.o.   MRN: [id number]  HPI Comments: Melinda Hall is a 52 year old woman with long smoking history from age 64 to age 34, less than one half pack per day who initially presented with  symptoms of palpitations, weakness, lightheadedness associated with cold sweats, near syncope.She presents for routine followup.  She states that since her last clinic visit, she has stopped smoking, approximately 3 weeks ago. Her symptoms of weakness, lightheadedness, cold sweats have significantly improved. She wore a 30 day monitor. No significant lightheaded or near syncope spells. 30 day monitor showed normal sinus rhythm with no significant arrhythmia, rare APCs.  Overall she is in good spirits with no significant complaints. She would like to start a regular exercise program. He denies any significant shortness of breath or chest discomfort.   EKG today shows sinus bradycardia with rate 52 beats a minute with ST and T wave abnormality in leads 3, aVF (seen as well on recent readings EKG)    Outpatient Encounter Prescriptions as of 01/04/2013  Medication Sig  . Cholecalciferol (HM VITAMIN D3) 2000 UNITS CAPS Take 1 capsule (2,000 Units total) by mouth daily.  . ferrous sulfate 325 (65 FE) MG tablet Take 1 tablet (325 mg total) by mouth 2 (two) times daily.     Review of Systems  HENT: Negative.   Eyes: Negative.   Respiratory: Negative.   Cardiovascular: Positive for palpitations.  Gastrointestinal: Negative.   Endocrine: Negative.   Musculoskeletal: Negative.   Skin: Negative.   Allergic/Immunologic: Negative.   Hematological: Negative.   Psychiatric/Behavioral: Negative.   All other systems reviewed and are negative.    BP 100/80  Pulse 52  Ht 5\' 6"  (1.676 m)  Wt 189 lb 8 oz (85.957 kg)  BMI 30.60 kg/m2   Physical Exam  Nursing note and vitals reviewed. Constitutional: She is oriented to person, place, and time. She appears  well-developed and well-nourished.  HENT:  Head: Normocephalic.  Nose: Nose normal.  Mouth/Throat: Oropharynx is clear and moist.  Eyes: Conjunctivae are normal. Pupils are equal, round, and reactive to light.  Neck: Normal range of motion. Neck supple. No JVD present.  Cardiovascular: Normal rate, regular rhythm, S1 normal, S2 normal, normal heart sounds and intact distal pulses.  Exam reveals no gallop and no friction rub.   No murmur heard. Pulmonary/Chest: Effort normal and breath sounds normal. No respiratory distress. She has no wheezes. She has no rales. She exhibits no tenderness.  Abdominal: Soft. Bowel sounds are normal. She exhibits no distension. There is no tenderness.  Musculoskeletal: Normal range of motion. She exhibits no edema and no tenderness.  Lymphadenopathy:    She has no cervical adenopathy.  Neurological: She is alert and oriented to person, place, and time. Coordination normal.  Skin: Skin is warm and dry. No rash noted. No erythema.  Psychiatric: She has a normal mood and affect. Her behavior is normal. Judgment and thought content normal.    Assessment and Plan

## 2013-01-07 ENCOUNTER — Encounter (INDEPENDENT_AMBULATORY_CARE_PROVIDER_SITE_OTHER): Payer: BC Managed Care – PPO

## 2013-01-07 DIAGNOSIS — R000 Tachycardia, unspecified: Secondary | ICD-10-CM

## 2013-01-07 DIAGNOSIS — R55 Syncope and collapse: Secondary | ICD-10-CM

## 2013-01-07 DIAGNOSIS — R002 Palpitations: Secondary | ICD-10-CM

## 2013-02-25 ENCOUNTER — Encounter: Payer: Self-pay | Admitting: Family Medicine

## 2013-02-25 ENCOUNTER — Ambulatory Visit (INDEPENDENT_AMBULATORY_CARE_PROVIDER_SITE_OTHER): Payer: BC Managed Care – PPO | Admitting: Family Medicine

## 2013-02-25 ENCOUNTER — Encounter: Payer: Self-pay | Admitting: *Deleted

## 2013-02-25 VITALS — BP 118/82 | HR 76 | Temp 98.6°F | Wt 189.5 lb

## 2013-02-25 DIAGNOSIS — I889 Nonspecific lymphadenitis, unspecified: Secondary | ICD-10-CM | POA: Insufficient documentation

## 2013-02-25 MED ORDER — NAPROXEN 500 MG PO TABS: ORAL_TABLET | ORAL | Status: DC

## 2013-02-25 MED ORDER — AMOXICILLIN-POT CLAVULANATE 875-125 MG PO TABS: 1.0000 | ORAL_TABLET | Freq: Two times a day (BID) | ORAL | Status: AC

## 2013-02-25 NOTE — Assessment & Plan Note (Signed)
Anticipate infectious.  Treat with augmentin course x 1 wk, NSAID, and warm compresses. Update if persistent or worsening swelling. Low smoking PY history.

## 2013-02-25 NOTE — Patient Instructions (Signed)
Creo que tiene infeccion de una glandula de su garganta. Normalmente esto es causado por infeccion - tome augmentin dos veces al dia por 7 dias, y use naporsyn cuando necesite para inflamacion. si glandula persiste mas de 4 semanas, dejeme saber.  Cervical Adenitis You have a swollen lymph gland in your neck. This commonly happens with Strep and virus infections, dental problems, insect bites, and injuries about the face, scalp, or neck. The lymph glands swell as the body fights the infection or heals the injury. Swelling and firmness typically lasts for several weeks after the infection or injury is healed. Rarely lymph glands can become swollen because of cancer or TB. Antibiotics are prescribed if there is evidence of an infection. Sometimes an infected lymph gland becomes filled with pus. This condition may require opening up the abscessed gland by draining it surgically. Most of the time infected glands return to normal within two weeks. Do not poke or squeeze the swollen lymph nodes. That may keep them from shrinking back to their normal size. If the lymph gland is still swollen after 2 weeks, further medical evaluation is needed.  SEEK IMMEDIATE MEDICAL CARE IF:  You have difficulty swallowing or breathing, increased swelling, severe pain, or a high fever.  Document Released: 01/17/2005 Document Revised: 04/11/2011 Document Reviewed: 07/09/2006 [facility] Patient Information 2014 [place].

## 2013-02-25 NOTE — Progress Notes (Signed)
Pre-visit discussion using our clinic review tool. No additional management support is needed unless otherwise documented below in the visit note.  

## 2013-02-25 NOTE — Progress Notes (Signed)
   Subjective:    Patient ID: Melinda Hall, female    DOB: 05/13/1960, 53 y.o.   MRN: [id number]  HPI CC: R neck swelling  3d h/o swelling in R neck.noticed when she turned her head to the left.  Evaluated on Friday at [facility].  Told possible infection, rec warm compresses.  Actually getting worse.  Worsening pain, worsening trouble swallowing.  Denies malaise - no ear pain, no neck pain, no congestion.  No fever.  No tooth pain. Taking husband's naprosyn for inflammation which helps pain and allows her to swallow easier. Trouble eating solids 2/2 pain with opening mouth and swallowing.  Last saw dentist 6 months ago.   No cats at home. No other lymphadenopathy. 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  Quit smoking 3 mo ago, prior smoking only 2-3 cigarettes per day. No EtOH.  Wt Readings from Last 3 Encounters:  02/25/13 189 lb 8 oz (85.957 kg)  01/04/13 189 lb 8 oz (85.957 kg)  11/23/12 188 lb 8 oz (85.503 kg)    Past Medical History  Diagnosis Date  . History of cardiac murmur   . Generalized headaches     frequent  . Gastric bypass status for obesity 2004    s/p roux-en-y  . Smoker   . Heart murmur      Review of Systems Per HPI    Objective:   Physical Exam  Nursing note and vitals reviewed. Constitutional: She appears well-developed and well-nourished. No distress.  HENT:  Head: Normocephalic and atraumatic.  Right Ear: Hearing, tympanic membrane, external ear and ear canal normal.  Left Ear: Hearing, tympanic membrane, external ear and ear canal normal.  Nose: Nose normal. No mucosal edema or rhinorrhea. Right sinus exhibits no maxillary sinus tenderness and no frontal sinus tenderness. Left sinus exhibits no maxillary sinus tenderness and no frontal sinus tenderness.  Mouth/Throat: Uvula is midline, oropharynx is clear and moist and mucous membranes are normal. No oropharyngeal exudate, posterior oropharyngeal edema, posterior oropharyngeal erythema or  tonsillar abscesses.  Eyes: Conjunctivae and EOM are normal. Pupils are equal, round, and reactive to light. No scleral icterus.  Neck: Normal range of motion. Neck supple.  Abdominal: Soft. Normal appearance and bowel sounds are normal. She exhibits no distension and no mass. There is no hepatosplenomegaly. There is no tenderness. There is no rigidity, no rebound, no guarding and negative Murphy's sign.  Musculoskeletal: She exhibits no edema.  Lymphadenopathy:       Head (right side): Submandibular (subparotid, large, tender about 2cm) adenopathy present. No submental, no tonsillar, no preauricular and no posterior auricular adenopathy present.       Head (left side): No submental, no submandibular, no tonsillar, no preauricular and no posterior auricular adenopathy present.    She has cervical adenopathy (shotty).    She has no axillary adenopathy.       Right axillary: No lateral adenopathy present.       Left axillary: No lateral adenopathy present.      Right: No inguinal and no supraclavicular adenopathy present.       Left: No inguinal and no supraclavicular adenopathy present.  Skin: Skin is warm and dry. No rash noted.       Assessment & Plan:

## 2013-02-26 ENCOUNTER — Emergency Department: Payer: Self-pay | Admitting: Emergency Medicine

## 2013-02-26 ENCOUNTER — Telehealth: Payer: Self-pay | Admitting: Family Medicine

## 2013-02-26 NOTE — Telephone Encounter (Signed)
Pt husband stopped by office to say the pain medication prescribed yesterday is not working and she is still in a lot of pain. Is there anything else that can be prescribed. Please advise. Pt husband request c/b

## 2013-02-26 NOTE — Telephone Encounter (Signed)
Called and left message on pt and husband's answering machine. Ok to extend work note. Ok to try different pain med, but prior plz get following information - any fever, spreading redness or enlarging of gland?

## 2013-02-26 NOTE — Telephone Encounter (Signed)
Spoke with husband.  Pt was seen at ER this afternoon and had oral abscess I&D'd.  Pt was upset that he was not able to get me a message this morning.   He states he's had several experiences where he is only able to get "no" when talking with front office staff. I would have appreciated to receive this message sooner than 1pm on a patient I saw the day before who was worsening. I will route to Adrienne to be aware.

## 2013-02-26 NOTE — Telephone Encounter (Signed)
Patient also requested that work note be extended. Ok to do?

## 2013-02-26 NOTE — Telephone Encounter (Signed)
Apparently husband came back into and wanted to know why we hadn't called back. It was explained to him that you were still seeing your morning patients and as soon as you finished seeing them, you would look at the note and I would call them back. He did not like that answer and stormed out saying he would just take her to the ER.

## 2013-02-28 ENCOUNTER — Emergency Department: Payer: Self-pay | Admitting: Internal Medicine

## 2013-02-28 LAB — BASIC METABOLIC PANEL
Anion Gap: 4 — ABNORMAL LOW (ref 7–16)
BUN: 13 mg/dL (ref 7–18)
Calcium, Total: 9.2 mg/dL (ref 8.5–10.1)
Chloride: 104 mmol/L (ref 98–107)
Co2: 30 mmol/L (ref 21–32)
Creatinine: 0.67 mg/dL (ref 0.60–1.30)
EGFR (African American): >60
EGFR (Non-African Amer.): >60
Glucose: 81 mg/dL (ref 65–99)
Osmolality: 275 (ref 275–301)
Potassium: 3.7 mmol/L (ref 3.5–5.1)
Sodium: 138 mmol/L (ref 136–145)

## 2013-02-28 LAB — CBC WITH DIFFERENTIAL/PLATELET
Basophil #: 0.1 10*3/uL (ref 0.0–0.1)
Basophil %: 0.8 %
Eosinophil #: 0.1 10*3/uL (ref 0.0–0.7)
Eosinophil %: 1.9 %
HCT: 38.3 % (ref 35.0–47.0)
HGB: 12.9 g/dL (ref 12.0–16.0)
Lymphocyte #: 1.7 10*3/uL (ref 1.0–3.6)
Lymphocyte %: 23 %
MCH: 28.4 pg (ref 26.0–34.0)
MCHC: 33.6 g/dL (ref 32.0–36.0)
MCV: 85 fL (ref 80–100)
Monocyte #: 0.7 x10 3/mm (ref 0.2–0.9)
Monocyte %: 9 %
Neutrophil #: 4.7 10*3/uL (ref 1.4–6.5)
Neutrophil %: 65.3 %
Platelet: 218 10*3/uL (ref 150–440)
RBC: 4.53 10*6/uL (ref 3.80–5.20)
RDW: 15.2 % — ABNORMAL HIGH (ref 11.5–14.5)
WBC: 7.3 10*3/uL (ref 3.6–11.0)

## 2013-03-05 ENCOUNTER — Ambulatory Visit: Payer: Self-pay | Admitting: Otolaryngology

## 2013-03-07 ENCOUNTER — Ambulatory Visit: Payer: Self-pay | Admitting: Otolaryngology

## 2013-03-13 LAB — PATHOLOGY REPORT

## 2013-06-06 ENCOUNTER — Ambulatory Visit (INDEPENDENT_AMBULATORY_CARE_PROVIDER_SITE_OTHER): Payer: BC Managed Care – PPO | Admitting: Family Medicine

## 2013-06-06 ENCOUNTER — Encounter: Payer: Self-pay | Admitting: Family Medicine

## 2013-06-06 VITALS — BP 142/84 | HR 80 | Temp 98.2°F | Wt 191.2 lb

## 2013-06-06 DIAGNOSIS — E66811 Obesity, class 1: Secondary | ICD-10-CM | POA: Insufficient documentation

## 2013-06-06 DIAGNOSIS — M7071 Other bursitis of hip, right hip: Secondary | ICD-10-CM | POA: Insufficient documentation

## 2013-06-06 DIAGNOSIS — M25559 Pain in unspecified hip: Secondary | ICD-10-CM

## 2013-06-06 DIAGNOSIS — E669 Obesity, unspecified: Secondary | ICD-10-CM

## 2013-06-06 DIAGNOSIS — M25552 Pain in left hip: Secondary | ICD-10-CM

## 2013-06-06 DIAGNOSIS — M7072 Other bursitis of hip, left hip: Secondary | ICD-10-CM

## 2013-06-06 DIAGNOSIS — M25551 Pain in right hip: Secondary | ICD-10-CM

## 2013-06-06 DIAGNOSIS — E663 Overweight: Secondary | ICD-10-CM | POA: Insufficient documentation

## 2013-06-06 DIAGNOSIS — J019 Acute sinusitis, unspecified: Secondary | ICD-10-CM

## 2013-06-06 MED ORDER — AMOXICILLIN-POT CLAVULANATE 875-125 MG PO TABS: 1.0000 | ORAL_TABLET | Freq: Two times a day (BID) | ORAL | Status: AC

## 2013-06-06 NOTE — Progress Notes (Signed)
BP 142/84  Pulse 80  Temp(Src) 98.2 F (36.8 C) (Oral)  Wt 191 lb 4 oz (86.75 kg)   CC: sinusitis?  Subjective:    Patient ID: Melinda Hall, female    DOB: 04/26/1960, 53 y.o.   MRN: [id number]  HPI: Melinda Hall is a 53 y.o. female presenting on 06/06/2013 for Sinusitis   1 wk h/o cold sxs - purulent drainage out of nose, + head congestion, mild cough.  + fatigue.  No fevers/chills, malaise, abd pain, ear or tooth pain, ST. Tried ibuprofen, hot tea, aspirin, nyquil capsules. No sick contacts at home. No smokers at home.  Quit 10/2012.  Trouble sleeping at night time.  Tends to sleep on sides.  Starting to have bilateral lateral hip and leg pain.  Denies inciting trauma/injury.  Obesity - Body mass index is 30.88 kg/(m^2). Having trouble with weight.  Feels need to eat at dinner.  Constantly snacks.  H/o bariatric surgery.  Dropped to 165lbs (lost 135lbs). Desires to drop 20 lbs.  Feels depressed from this. Stays active - exercise. Works in yard regularly currently.  Stays active at work as well. Wt Readings from Last 3 Encounters:  06/06/13 191 lb 4 oz (86.75 kg)  02/25/13 189 lb 8 oz (85.957 kg)  01/04/13 189 lb 8 oz (85.957 kg)    Recent peritonsillar abscess that needed drainage at ER.  Past Medical History  Diagnosis Date  . History of cardiac murmur   . Generalized headaches     frequent  . Gastric bypass status for obesity 2004    s/p roux-en-y  . Smoker   . Heart murmur     Past Surgical History  Procedure Laterality Date  . Bilateral oophorectomy  ~1992    for dermoid ovarian cysts  . Cholecystectomy  2000s  . Gastric bypass  2004    roux en y [facility])  . Appendectomy  1980    Relevant past medical, surgical, family and social history reviewed and updated as indicated.  Allergies and medications reviewed and updated. Current Outpatient Prescriptions on File Prior to Visit  Medication Sig  . naproxen (NAPROSYN) 500 MG tablet Take one po bid x 1  week then prn pain, take with food  . propranolol (INDERAL) 10 MG tablet Take 1 tablet (10 mg total) by mouth 3 (three) times daily as needed.  . Cholecalciferol (HM VITAMIN D3) 2000 UNITS CAPS Take 1 capsule (2,000 Units total) by mouth daily.  . ferrous sulfate 325 (65 FE) MG tablet Take 1 tablet (325 mg total) by mouth 2 (two) times daily.   No current facility-administered medications on file prior to visit.    Review of Systems Per HPI unless specifically indicated above    Objective:    BP 142/84  Pulse 80  Temp(Src) 98.2 F (36.8 C) (Oral)  Wt 191 lb 4 oz (86.75 kg)  Physical Exam  Nursing note and vitals reviewed. Constitutional: She appears well-developed and well-nourished. No distress.  HENT:  Head: Normocephalic and atraumatic.  Right Ear: Hearing, tympanic membrane, external ear and ear canal normal.  Left Ear: Hearing, tympanic membrane, external ear and ear canal normal.  Nose: No mucosal edema or rhinorrhea. Right sinus exhibits no maxillary sinus tenderness and no frontal sinus tenderness. Left sinus exhibits no maxillary sinus tenderness and no frontal sinus tenderness.  Mouth/Throat: Uvula is midline, oropharynx is clear and moist and mucous membranes are normal. No oropharyngeal exudate, posterior oropharyngeal edema, posterior oropharyngeal erythema or tonsillar abscesses.  Eyes: Conjunctivae and EOM are normal. Pupils are equal, round, and reactive to light. No scleral icterus.  Neck: Normal range of motion. Neck supple.  Cardiovascular: Normal rate, regular rhythm, normal heart sounds and intact distal pulses.   No murmur heard. Pulmonary/Chest: Effort normal and breath sounds normal. No respiratory distress. She has no wheezes. She has no rales.  Musculoskeletal: She exhibits no edema.  No pain with int/ext rotation at hips. + tender to palpation bilateral GTB No pain at SIJ bilaterally  Lymphadenopathy:    She has no cervical adenopathy.  Skin: Skin is  warm and dry. No rash noted.       Assessment & Plan:   Problem List Items Addressed This Visit   Acute sinusitis - Primary     7d h/o sxs, progressively worsening. Will cover for bacterial sinusitis with augmentin course. See pt instructions for plan.    Relevant Medications      amoxicillin-clavulanate (AUGMENTIN) tablet 875-125 mg   Obesity     Reviewed importance of healthy diet and exercise. I have asked her to return with 1 wk food diary to review and then will discuss bariatric medications.    Hip pain, bilateral     Exam consistent with greater trochanteric bursitis bilaterally. rec aleve 440mg  nightly for 5 days (pain is only at night time), exercises provided today from [facility] pt advisor. If not better, suggested she call Dr. Lorelei Pont to schedule possible bursal steroid injection.        Follow up plan: Return if symptoms worsen or fail to improve, for follow up visit.

## 2013-06-06 NOTE — Progress Notes (Signed)
Pre visit review using our clinic review tool, if applicable. No additional management support is needed unless otherwise documented below in the visit note. 

## 2013-06-06 NOTE — Assessment & Plan Note (Signed)
7d h/o sxs, progressively worsening. Will cover for bacterial sinusitis with augmentin course. See pt instructions for plan.

## 2013-06-06 NOTE — Assessment & Plan Note (Signed)
Exam consistent with greater trochanteric bursitis bilaterally. rec aleve 440mg  nightly for 5 days (pain is only at night time), exercises provided today from [facility] pt advisor. If not better, suggested she call Dr. Lorelei Pont to schedule possible bursal steroid injection.

## 2013-06-06 NOTE — Patient Instructions (Signed)
You have a sinus infection. Take medicine as prescribed: augmentin twice daily for 10 days. Push fluids and plenty of rest. Nasal saline irrigation or neti pot to help drain sinuses. May use plain mucinex with plenty of fluid to help mobilize mucous. Let us know if fever >101.5, trouble opening/closing mouth, difficulty swallowing, or worsening - you may need to be seen again.  Para cadera - tiene bursitis de el trocanter.  Use aleve 2 pepas cada noche por 5 dias (con comida) y luego como necesite.  Use ejercicios que te he dado hoy.  Para el peso - regrese cuando se este sintiendo mejor con diario de lo que come cada dia por una semana antes de su proxima visita y discutiremos tratamiento.

## 2013-06-06 NOTE — Assessment & Plan Note (Signed)
Reviewed importance of healthy diet and exercise. I have asked her to return with 1 wk food diary to review and then will discuss bariatric medications.

## 2013-06-20 ENCOUNTER — Ambulatory Visit (INDEPENDENT_AMBULATORY_CARE_PROVIDER_SITE_OTHER): Payer: BC Managed Care – PPO | Admitting: Family Medicine

## 2013-06-20 ENCOUNTER — Encounter: Payer: Self-pay | Admitting: Family Medicine

## 2013-06-20 VITALS — BP 118/64 | HR 60 | Temp 98.3°F | Wt 188.5 lb

## 2013-06-20 DIAGNOSIS — E669 Obesity, unspecified: Secondary | ICD-10-CM

## 2013-06-20 MED ORDER — LORCASERIN HCL 10 MG PO TABS: 1.0000 | ORAL_TABLET | Freq: Two times a day (BID) | ORAL | Status: DC

## 2013-06-20 MED ORDER — LORCASERIN HCL 10 MG PO TABS: 10.0000 mg | ORAL_TABLET | Freq: Two times a day (BID) | ORAL | Status: DC

## 2013-06-20 NOTE — Progress Notes (Signed)
BP 118/64  Pulse 60  Temp(Src) 98.3 F (36.8 C) (Oral)  Wt 188 lb 8 oz (85.503 kg)   CC: obesity  Subjective:    Patient ID: Melinda Hall, female    DOB: [birth year], 53 y.o.   MRN: [id number]  HPI: Melinda Hall is a 53 y.o. female presenting on 06/20/2013 for Follow-up   Obesity - Body mass index is 30.44 kg/(m^2). Having trouble with weight. Feels need to eat at dinner - anxiety and restlessness until she eats again (usually bread or cheese/cold cuts).  H/o roux en y bariatric surgery. Dropped to 165lbs (lost 135lbs). Desires to drop 20 lbs. Feels depressed from this.  Stays active at home and at work - works in yard regularly.  Wt Readings from Last 3 Encounters:  06/20/13 188 lb 8 oz (85.503 kg)  06/06/13 191 lb 4 oz (86.75 kg)  02/25/13 189 lb 8 oz (85.957 kg)    24 hour recall 5:30am 1 cup coffee with splenda 8:30am small bowl of cereal (cheerios) with 2% milk (at work) 12:30pm bowl of sweet peas with 3 chicken nuggets, water 3pm handful of salt free pretzels, ginger and lemon sweet tea 7:30pm rice and beans with picadillo (ground beef), water 11pm piece of bread with ham and cheese   Past Medical History  Diagnosis Date  . History of cardiac murmur   . Generalized headaches     frequent  . Gastric bypass status for obesity 2004    s/p roux-en-y  . Smoker   . Heart murmur     Past Surgical History  Procedure Laterality Date  . Bilateral oophorectomy  ~1992    for dermoid ovarian cysts  . Cholecystectomy  2000s  . Gastric bypass  2004    roux en y [facility])  . Appendectomy  1980   Family History  Problem Relation Age of Onset  . Cancer Father 77    leukemia  . Hypertension Mother   . Diabetes Neg Hx   . Stroke Neg Hx   . Coronary artery disease Neg Hx    Relevant past medical, surgical, family and social history reviewed and updated as indicated.  Allergies and medications reviewed and updated. Current Outpatient Prescriptions on File Prior to  Visit  Medication Sig  . ferrous sulfate 325 (65 FE) MG tablet Take 1 tablet (325 mg total) by mouth 2 (two) times daily.  . Cholecalciferol (HM VITAMIN D3) 2000 UNITS CAPS Take 1 capsule (2,000 Units total) by mouth daily.  . propranolol (INDERAL) 10 MG tablet Take 1 tablet (10 mg total) by mouth 3 (three) times daily as needed.   No current facility-administered medications on file prior to visit.    Review of Systems Per HPI unless specifically indicated above    Objective:    BP 118/64  Pulse 60  Temp(Src) 98.3 F (36.8 C) (Oral)  Wt 188 lb 8 oz (85.503 kg)  Physical Exam  Nursing note and vitals reviewed. Constitutional: She appears well-developed and well-nourished. No distress.       Assessment & Plan:   Problem List Items Addressed This Visit   Obesity - Primary     24 hour recall reviewed - encouraged increased protein in meals, healthier snacks and larger portion sizes earlier in the day. rec yogurt as snack at bedtime. rec increased water throughout the day. Discussed different bariatric medication options, pt desires trial of belviq. Provided with coupon card as well, rec she search for free 15d belviq voucher online as  we do not have available here. A total of 25 minutes were spent face-to-face with the patient during this encounter and over half of that time was spent on counseling and coordination of care    Relevant Medications      Lorcaserin HCl 10 MG TABS      Lorcaserin HCl (BELVIQ) 10 MG TABS       Follow up plan: Return in about 6 weeks (around 08/01/2013), or if symptoms worsen or fail to improve, for follow up visit.

## 2013-06-20 NOTE — Patient Instructions (Signed)
trate belviq 10mg  diarios por 5 dias despues sube a dos veces al dia. regresar en 6 semanas para seguimiento.  Lorcaserin oral tablets Qu es este medicamento? [place] es [place] para promocionar y Theatre manager prdida de peso en pacientes obesos. Se debe utilizar este medicamento con una dieta baja en caloras y, si es apropiado, un programa de ejercicio. Este medicamento puede ser utilizado para otros usos; si tiene alguna pregunta consulte con su proveedor de atencin mdica o con su farmacutico. MARCAS COMERCIALES DISPONIBLES: Osa Craver le debo informar a mi profesional de la salud antes de tomar este medicamento? Necesita saber si usted presenta alguno de los [employer] o situaciones: -deformacin anatmica del pene, enfermedad de [place] o antecedentes de priapismo (erecciones dolorosas o prolongadas) -diabetes -enfermedad cardiaca -antecedentes de trastornos sanguneos como anemia drepanoctica o leucemia -antecedentes pulso cardiaco irregular -enfermedad renal -enfermedad heptica -ideas suicidas, planes o intento; si usted o alguien de su familia ha intentado un suicidio previo -una reaccin alrgica o inusual a la lorcaserina, a otros medicamentos, alimentos, colorantes o conservantes -si est embarazada o buscando quedar embarazada -si est amamantando a un beb Cmo debo utilizar este medicamento? Tome este medicamento por va oral con un vaso de agua. Siga las instrucciones de la etiqueta del [place]. Usted puede tomarlo con o sin alimentos. Tome sus dosis a intervalos regulares. No tome su medicamento con una frecuencia mayor a la indicada. No deje de tomarlo excepto si as lo indica su mdico. Hable con su pediatra para informarse acerca del uso de este medicamento en nios. Puede requerir atencin especial. Sobredosis: Pngase en contacto inmediatamente con un centro toxicolgico o una sala de urgencia si usted cree que haya tomado demasiado  medicamento. ATENCIN: [employer] es solo para usted. No comparta este medicamento con nadie. Qu sucede si me olvido de una dosis? Si se olvida una dosis, tmela lo antes posible. Si es casi la hora de la prxima dosis, tome slo esa dosis. No tome dosis adicionales o dobles. Qu puede interactuar con este medicamento? -cabergolina -ciertos medicamentos para la depresin, ansiedad o trastornos psicticos -ciertos medicamentos para la disfuncin erctil -medicamentos para las migraas, tales como almotriptn, eletriptn, frovatriptn, naratriptn, rizatriptn, sumatriptn, zolmitriptn -dextrometorfano -linezolid -IMAOs, tales como Carbex, Eldepryl, Marplan, Nardil y Parnate -medicamentos para diabetes -orlistat -tramadol -hierba de [place] -medicamentos estimulantes para trastornos de Freight forwarder, perder peso o [employer] despierto Puede ser que esta lista no menciona todas las posibles interacciones. Informe a su profesional de [employer]	[place] de [employer] productos a base de hierbas, medicamentos de [place] o suplementos nutritivos que est tomando. Si usted fuma, consume bebidas alcohlicas o si utiliza drogas ilegales, indqueselo tambin a su profesional de [employer]	[place]. Algunas sustancias pueden interactuar con su medicamento. A qu debo estar atento al usar [employer]? Este medicamento fue diseado para ser utilizado en combinacin con una dieta saludable y ejercicio. De esta manera se obtienen los [employer]. No aumente ni modifique su dosis de ninguna manera sin consultar a su mdico o su profesional de Technical sales engineer. Su mdico debe decirle que deje de tomar este medicamento si no pierde una cierta cantidad de [employer] primeras 12 semanas de tratamiento. Visite a su mdico o a su profesional de la salud para chequear su evolucin peridicamente. Su mdico puede pedirle que se someta a anlisis de [place] u otras pruebas para Mining engineer.  No conduzca ni utilice  maquinaria ni haga nada que le Risk manager en [place] de  alerta hasta que sepa cmo le afecta este medicamento.  Este medicamento puede afectar su nivel de Dispensing optician. Si tiene diabetes, consulte a su mdico o a su profesional de la salud antes de cambiar su dieta o la dosis de su medicamento para la diabetes. Los pacientes y sus familias deben estar atentos si empeora la depresin o ideas suicidas. Tambin est atento a cambios repentinos o severos de emocin, tales como el sentirse ansioso, agitado, lleno de pnico, irritable, hostil, agresivo, impulsivo, inquietud severa, demasiado excitado e hiperactivo o dificultad para conciliar el sueo. Si esto ocurre, especialmente al comenzar con el tratamiento antidepresivo o al cambiar de dosis, comunquese con su profesional de [employer]	[place]. Comunquese con su mdico o con su profesional de la salud inmediatamente, si una ereccin dura ms de 4 horas o si se vuelve dolorosa. Esto puede ser una seal de un problema serio y debe tratarse inmediatamente para prevenir Paramedic. Qu efectos secundarios puedo tener al [employer] este medicamento? Efectos secundarios que debe informar a su mdico o a Barrister's clerk de la salud tan pronto como sea posible: -Chief of Staff como erupcin cutnea, picazn o urticarias, hinchazn de la cara, labios o lengua -produccin de [employer] anormal -agrandamiento de los pechos, tanto en hombres como mujeres -problemas respiratorios -cambios de emociones o humor -cambios en la visin  -confusin -ereccin que dura mas de 4 horas -pulso cardiaco rpido, irregular -sensacin de desmayos o aturdimiento, cadas -fiebre o escalofros, dolor de garganta -alucinaciones, perdida del contacto con la realidad -alta o baja presin sangunea -cambios menstruales -inquietud -convulsiones -pulso cardiaco lento o irregular -rigidez de msculos -sudoracin -ideas suicidas u otros cambios de  humor -temblores -dificultad para caminar -cansancio o debilidad inusual -vmito  Efectos secundarios que, por lo general, no requieren atencin mdica (debe informarlos a su mdico o a su profesional de la salud si persisten o si son molestos): -dolor de espalda -enrojecimiento -tos -mareos -boca seca -bajo nivel de glucosa en la sangre si tiene diabetes (pregunte a su mdico o su profesional de la salud por una lista de estos sntomas) -nuseas -cansancio Puede ser que esta lista no menciona todos los posibles efectos secundarios. Comunquese a su mdico por asesoramiento mdico [employer]. Usted puede informar los efectos secundarios a la FDA por telfono al 1-800-FDA-1088. Dnde debo guardar mi medicina? Mantngala fuera del alcance de los nios. Gurdela a [employer], entre 15 y 68 grados C (47 y 73 grados F). Deseche todo el medicamento que no haya utilizado, despus de la fecha de vencimiento. ATENCIN: Este folleto es un resumen. Puede ser que no cubra toda la posible informacin. Si usted tiene preguntas acerca de esta medicina, consulte con su mdico, su farmacutico o su profesional de Technical sales engineer.  2014, Elsevier/Gold Standard. (2010-08-31 15:21:33)

## 2013-06-20 NOTE — Assessment & Plan Note (Signed)
24 hour recall reviewed - encouraged increased protein in meals, healthier snacks and larger portion sizes earlier in the day. rec yogurt as snack at bedtime. rec increased water throughout the day. Discussed different bariatric medication options, pt desires trial of belviq. Provided with coupon card as well, rec she search for free 15d belviq voucher online as we do not have available here. A total of 25 minutes were spent face-to-face with the patient during this encounter and over half of that time was spent on counseling and coordination of care

## 2013-06-20 NOTE — Progress Notes (Signed)
Pre visit review using our clinic review tool, if applicable. No additional management support is needed unless otherwise documented below in the visit note. 

## 2013-07-10 ENCOUNTER — Emergency Department: Payer: Self-pay | Admitting: Internal Medicine

## 2013-07-10 LAB — CBC
HCT: 36.6 % (ref 35.0–47.0)
HGB: 11.8 g/dL — ABNORMAL LOW (ref 12.0–16.0)
MCH: 26.2 pg (ref 26.0–34.0)
MCHC: 32.1 g/dL (ref 32.0–36.0)
MCV: 82 fL (ref 80–100)
Platelet: 177 10*3/uL (ref 150–440)
RBC: 4.49 10*6/uL (ref 3.80–5.20)
RDW: 15.9 % — ABNORMAL HIGH (ref 11.5–14.5)
WBC: 6.8 10*3/uL (ref 3.6–11.0)

## 2013-07-10 LAB — BASIC METABOLIC PANEL
Anion Gap: 3 — ABNORMAL LOW (ref 7–16)
BUN: 15 mg/dL (ref 7–18)
Calcium, Total: 8.9 mg/dL (ref 8.5–10.1)
Chloride: 108 mmol/L — ABNORMAL HIGH (ref 98–107)
Co2: 28 mmol/L (ref 21–32)
Creatinine: 0.81 mg/dL (ref 0.60–1.30)
EGFR (African American): >60
EGFR (Non-African Amer.): >60
Glucose: 86 mg/dL (ref 65–99)
Osmolality: 278 (ref 275–301)
Potassium: 3.9 mmol/L (ref 3.5–5.1)
Sodium: 139 mmol/L (ref 136–145)

## 2013-11-26 ENCOUNTER — Encounter: Payer: Self-pay | Admitting: Family Medicine

## 2013-11-26 ENCOUNTER — Ambulatory Visit (INDEPENDENT_AMBULATORY_CARE_PROVIDER_SITE_OTHER): Payer: BC Managed Care – PPO | Admitting: Family Medicine

## 2013-11-26 VITALS — BP 140/84 | HR 76 | Temp 98.1°F | Ht 66.0 in | Wt 180.5 lb

## 2013-11-26 DIAGNOSIS — Z0000 Encounter for general adult medical examination without abnormal findings: Secondary | ICD-10-CM

## 2013-11-26 DIAGNOSIS — E611 Iron deficiency: Secondary | ICD-10-CM

## 2013-11-26 DIAGNOSIS — M25552 Pain in left hip: Secondary | ICD-10-CM

## 2013-11-26 DIAGNOSIS — M25551 Pain in right hip: Secondary | ICD-10-CM

## 2013-11-26 DIAGNOSIS — Z23 Encounter for immunization: Secondary | ICD-10-CM

## 2013-11-26 DIAGNOSIS — E669 Obesity, unspecified: Secondary | ICD-10-CM

## 2013-11-26 DIAGNOSIS — Z9884 Bariatric surgery status: Secondary | ICD-10-CM

## 2013-11-26 DIAGNOSIS — Z1211 Encounter for screening for malignant neoplasm of colon: Secondary | ICD-10-CM

## 2013-11-26 DIAGNOSIS — Z01419 Encounter for gynecological examination (general) (routine) without abnormal findings: Secondary | ICD-10-CM

## 2013-11-26 NOTE — Assessment & Plan Note (Signed)
Check iron panel today. Pt has stopped iron tablets on her own

## 2013-11-26 NOTE — Patient Instructions (Addendum)
Flu shot today. Stool kit today. Dejeme saber si quiere inyeccion para bursitis de cadera. llame para cita para mamograma La llamaremos para cita con Dr. Toney Rakes

## 2013-11-26 NOTE — Assessment & Plan Note (Signed)
Check labs today.

## 2013-11-26 NOTE — Assessment & Plan Note (Signed)
Body mass index is 29.15 kg/(m^2).  Wt Readings from Last 3 Encounters:  11/26/13 180 lb 8 oz (81.874 kg)  06/20/13 188 lb 8 oz (85.503 kg)  06/06/13 191 lb 4 oz (86.75 kg)  congratulated on weight loss and encouraged continued healthy diet choices.

## 2013-11-26 NOTE — Progress Notes (Signed)
BP 140/84  Pulse 76  Temp(Src) 98.1 F (36.7 C) (Oral)  Ht 5\' 6"  (1.676 m)  Wt 180 lb 8 oz (81.874 kg)  BMI 29.15 kg/m2   CC: CPE  Subjective:    Patient ID: Melinda Hall, female    DOB: April 17, 1960, 53 y.o.   MRN: [id number]  HPI: Melinda Hall is a 53 y.o. female presenting on 11/26/2013 for Annual Exam   Weight loss - on belviq 10mg  bid, stopped this - and weight loss has continued with healthy diet and lifestyle changes.  Restarted exercising - zumba 2-3x/wk.   Wt Readings from Last 3 Encounters:  11/26/13 180 lb 8 oz (81.874 kg)  06/20/13 188 lb 8 oz (85.503 kg)  06/06/13 191 lb 4 oz (86.75 kg)   Body mass index is 29.15 kg/(m^2).  Not fasting today - last ate peach at 1:30pm.   Stress with getting acclimated to [place] - misses latin roots.  Persistent pain L hip worse when laying down.  Has had bone density scan 4-5 yrs ago and told normal.  S/p gastric bypass 2005. Self-stopped iron  Preventative: Colon cancer screening - iFOB requested. Did not complete last year.  Well woman with OBGYN. Would like to establish with spanish speaking OBGYN. S/p bilateral oophorectomy for dermoid cysts. Will refer to Dr. Toney Rakes Pap - 2012  mammo - 03/2012 normal.  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  Allergies and medications reviewed and updated. Current Outpatient Prescriptions on File Prior to Visit  Medication Sig  . Cholecalciferol (HM VITAMIN D3) 2000 UNITS CAPS Take 1 capsule (2,000 Units total) by mouth daily.  . ferrous sulfate 325 (65 FE) MG tablet Take 1 tablet (325 mg total) by mouth 2 (two) times daily.  . propranolol  (INDERAL) 10 MG tablet Take 1 tablet (10 mg total) by mouth 3 (three) times daily as needed.   No current facility-administered medications on file prior to visit.    Review of Systems  Constitutional: Negative for fever, chills, activity change, appetite change, fatigue and unexpected weight change.  HENT: Negative for hearing loss.   Eyes: Negative for visual disturbance.  Respiratory: Negative for cough, chest tightness, shortness of breath and wheezing.   Cardiovascular: Negative for chest pain, palpitations and leg swelling.  Gastrointestinal: Negative for nausea, vomiting, abdominal pain, diarrhea, constipation, blood in stool and abdominal distention.  Genitourinary: Negative for hematuria and difficulty urinating.  Musculoskeletal: Negative for arthralgias, myalgias and neck pain.  Skin: Negative for rash.  Neurological: Negative for dizziness, seizures, syncope and headaches.  Hematological: Negative for adenopathy. Does not bruise/bleed easily.  Psychiatric/Behavioral: Positive for dysphoric mood (trouble acclimating to [place] culture/environment). The patient is not nervous/anxious.    Per HPI unless specifically indicated above    Objective:    BP 140/84  Pulse 76  Temp(Src) 98.1 F (36.7 C) (Oral)  Ht 5\' 6"  (1.676 m)  Wt 180 lb 8 oz (81.874 kg)  BMI 29.15 kg/m2  Physical Exam  Nursing note and vitals reviewed. Constitutional: She is oriented to person, place, and time. She appears well-developed and well-nourished. No distress.  HENT:  Head: Normocephalic and atraumatic.  Right Ear:  Hearing, tympanic membrane, external ear and ear canal normal.  Left Ear: Hearing, tympanic membrane, external ear and ear canal normal.  Nose: Nose normal.  Mouth/Throat: Uvula is midline, oropharynx is clear and moist and mucous membranes are normal. No oropharyngeal exudate, posterior oropharyngeal edema or posterior oropharyngeal erythema.  Eyes: Conjunctivae and EOM are normal. Pupils  are equal, round, and reactive to light. No scleral icterus.  Neck: Normal range of motion. Neck supple. No thyromegaly present.  Cardiovascular: Normal rate, regular rhythm, normal heart sounds and intact distal pulses.   No murmur heard. Pulses:      Radial pulses are 2+ on the right side, and 2+ on the left side.  Pulmonary/Chest: Effort normal and breath sounds normal. No respiratory distress. She has no wheezes. She has no rales.  Abdominal: Soft. Bowel sounds are normal. She exhibits no distension and no mass. There is no tenderness. There is no rebound and no guarding.  Musculoskeletal: Normal range of motion. She exhibits no edema.  Lymphadenopathy:    She has no cervical adenopathy.  Neurological: She is alert and oriented to person, place, and time.  CN grossly intact, station and gait intact  Skin: Skin is warm and dry. No rash noted.  Psychiatric: She has a normal mood and affect. Her behavior is normal. Judgment and thought content normal.   Results for orders placed in visit on 11/05/12  METANEPHRINES, URINE, 24 HOUR      Result Value Ref Range   Metanephrines, Ur 37 (*) 90 - 315 mcg/24 h   Normetanephrine, 24H Ur 525  122 - 676 mcg/24 h   Metaneph Total, Ur 562  224 - 832 mcg/24 h      Assessment & Plan:   Problem List Items Addressed This Visit   Status post gastric bypass for obesity     Check labs today.    Relevant Orders      Vitamin B12      Vit D  25 hydroxy (rtn osteoporosis monitoring)      CBC with Differential      Comprehensive metabolic panel   Obesity      Body mass index is 29.15 kg/(m^2).  Wt Readings from Last 3 Encounters:  11/26/13 180 lb 8 oz (81.874 kg)  06/20/13 188 lb 8 oz (85.503 kg)  06/06/13 191 lb 4 oz (86.75 kg)  congratulated on weight loss and encouraged continued healthy diet choices.    Iron deficiency     Check iron panel today. Pt has stopped iron tablets on her own    Relevant Orders      Ferritin      IBC panel   Hip  pain, bilateral     Discussed steroid shot - pt will let me know if desires this.    Healthcare maintenance - Primary     Preventative protocols reviewed and updated unless pt declined. Discussed healthy diet and lifestyle.     Other Visit Diagnoses   Need for influenza vaccination        Relevant Orders       Flu Vaccine QUAD 36+ mos PF IM (Fluarix Quad PF) (Completed)    Colon cancer screening        Relevant Orders       Fecal occult blood, imunochemical    Well woman exam        Relevant Orders       Ambulatory referral to Gynecology        Follow up  plan: Return in about 1 year (around 11/27/2014), or as needed, for annual exam, prior fasting for blood work.

## 2013-11-26 NOTE — Assessment & Plan Note (Signed)
Discussed steroid shot - pt will let me know if desires this.

## 2013-11-26 NOTE — Progress Notes (Signed)
Pre visit review using our clinic review tool, if applicable. No additional management support is needed unless otherwise documented below in the visit note. 

## 2013-11-26 NOTE — Assessment & Plan Note (Signed)
Preventative protocols reviewed and updated unless pt declined. Discussed healthy diet and lifestyle.  

## 2013-11-27 LAB — CBC WITH DIFFERENTIAL/PLATELET
Basophils Absolute: 0 10*3/uL (ref 0.0–0.1)
Basophils Relative: 0.6 % (ref 0.0–3.0)
Eosinophils Absolute: 0.1 10*3/uL (ref 0.0–0.7)
Eosinophils Relative: 1.8 % (ref 0.0–5.0)
HCT: 37.2 % (ref 36.0–46.0)
Hemoglobin: 11.9 g/dL — ABNORMAL LOW (ref 12.0–15.0)
Lymphocytes Relative: 31.6 % (ref 12.0–46.0)
Lymphs Abs: 2 10*3/uL (ref 0.7–4.0)
MCHC: 32 g/dL (ref 30.0–36.0)
MCV: 80.2 fl (ref 78.0–100.0)
Monocytes Absolute: 0.5 10*3/uL (ref 0.1–1.0)
Monocytes Relative: 7.3 % (ref 3.0–12.0)
Neutro Abs: 3.7 10*3/uL (ref 1.4–7.7)
Neutrophils Relative %: 58.7 % (ref 43.0–77.0)
Platelets: 217 10*3/uL (ref 150.0–400.0)
RBC: 4.63 Mil/uL (ref 3.87–5.11)
RDW: 15.4 % (ref 11.5–15.5)
WBC: 6.3 10*3/uL (ref 4.0–10.5)

## 2013-11-27 LAB — COMPREHENSIVE METABOLIC PANEL
ALT: 22 U/L (ref 0–35)
AST: 24 U/L (ref 0–37)
Albumin: 3.7 g/dL (ref 3.5–5.2)
Alkaline Phosphatase: 78 U/L (ref 39–117)
BUN: 17 mg/dL (ref 6–23)
CO2: 26 mEq/L (ref 19–32)
Calcium: 9.2 mg/dL (ref 8.4–10.5)
Chloride: 105 mEq/L (ref 96–112)
Creatinine, Ser: 0.7 mg/dL (ref 0.4–1.2)
GFR: 88.29 mL/min (ref >60.00)
Glucose, Bld: 80 mg/dL (ref 70–99)
Potassium: 4.4 mEq/L (ref 3.5–5.1)
Sodium: 139 mEq/L (ref 135–145)
Total Bilirubin: 0.7 mg/dL (ref 0.2–1.2)
Total Protein: 7.2 g/dL (ref 6.0–8.3)

## 2013-11-27 LAB — VITAMIN B12: Vitamin B-12: 440 pg/mL (ref 211–911)

## 2013-11-27 LAB — IBC PANEL
Iron: 18 ug/dL — ABNORMAL LOW (ref 42–145)
Saturation Ratios: 3.8 % — ABNORMAL LOW (ref 20.0–50.0)
Transferrin: 337.3 mg/dL (ref 212.0–360.0)

## 2013-11-27 LAB — VITAMIN D 25 HYDROXY (VIT D DEFICIENCY, FRACTURES): VITD: 20.37 ng/mL — ABNORMAL LOW (ref 30.00–100.00)

## 2013-11-27 LAB — FERRITIN: Ferritin: 3.6 ng/mL — ABNORMAL LOW (ref 10.0–291.0)

## 2013-12-05 ENCOUNTER — Other Ambulatory Visit: Payer: BC Managed Care – PPO

## 2013-12-05 DIAGNOSIS — Z1211 Encounter for screening for malignant neoplasm of colon: Secondary | ICD-10-CM

## 2013-12-05 LAB — FECAL OCCULT BLOOD, GUAIAC: Fecal Occult Blood: NEGATIVE

## 2013-12-06 ENCOUNTER — Telehealth: Payer: Self-pay | Admitting: Family Medicine

## 2013-12-06 LAB — FECAL OCCULT BLOOD, IMMUNOCHEMICAL: Fecal Occult Bld: NEGATIVE

## 2013-12-06 NOTE — Telephone Encounter (Signed)
Scheduled with patient.

## 2013-12-06 NOTE — Telephone Encounter (Signed)
Laurell Josephs pts daughter [phone] came in to schedule TB test for mother who is starting a new job in 2 weeks and needs the TB test done before then. We did not have any time on the nurse visit schedule and I asked Mearl Latin if should could fit her in anywhere but she couldn't so I wanted to check with you to see if there is anything that you could do?

## 2013-12-09 ENCOUNTER — Encounter: Payer: Self-pay | Admitting: *Deleted

## 2013-12-11 ENCOUNTER — Ambulatory Visit (INDEPENDENT_AMBULATORY_CARE_PROVIDER_SITE_OTHER): Payer: BC Managed Care – PPO | Admitting: *Deleted

## 2013-12-11 DIAGNOSIS — Z0279 Encounter for issue of other medical certificate: Secondary | ICD-10-CM

## 2013-12-13 LAB — TB SKIN TEST
Induration: 0 mm
TB Skin Test: NEGATIVE

## 2014-01-06 ENCOUNTER — Ambulatory Visit: Payer: Self-pay | Admitting: Gynecology

## 2014-02-25 ENCOUNTER — Ambulatory Visit: Payer: Self-pay | Admitting: Gynecology

## 2014-05-01 ENCOUNTER — Encounter: Payer: Self-pay | Admitting: Family Medicine

## 2014-05-01 ENCOUNTER — Ambulatory Visit (INDEPENDENT_AMBULATORY_CARE_PROVIDER_SITE_OTHER): Payer: BLUE CROSS/BLUE SHIELD | Admitting: Family Medicine

## 2014-05-01 VITALS — BP 114/70 | HR 60 | Temp 98.1°F

## 2014-05-01 DIAGNOSIS — G5602 Carpal tunnel syndrome, left upper limb: Secondary | ICD-10-CM

## 2014-05-01 DIAGNOSIS — G5601 Carpal tunnel syndrome, right upper limb: Secondary | ICD-10-CM | POA: Diagnosis not present

## 2014-05-01 DIAGNOSIS — M7072 Other bursitis of hip, left hip: Secondary | ICD-10-CM

## 2014-05-01 DIAGNOSIS — G5603 Carpal tunnel syndrome, bilateral upper limbs: Secondary | ICD-10-CM | POA: Insufficient documentation

## 2014-05-01 DIAGNOSIS — M7071 Other bursitis of hip, right hip: Secondary | ICD-10-CM

## 2014-05-01 NOTE — Assessment & Plan Note (Signed)
Discussed dx and treatment plan - continue ibuprofen 600mg  nightly, add wrist brace for night use. Update if not improved for referral to hand surgery.

## 2014-05-01 NOTE — Progress Notes (Signed)
Pre visit review using our clinic review tool, if applicable. No additional management support is needed unless otherwise documented below in the visit note. 

## 2014-05-01 NOTE — Assessment & Plan Note (Signed)
Provided with exercises from [facility] pt advisor on trochanteric bursitis. Continue ibuprofen prn. Discussed referral to [facility] to discuss bursitis injection.

## 2014-05-01 NOTE — Patient Instructions (Signed)
Tiene bursitis de las caderas - siga ibuprofeno. trate ejercicios que le he dado hoy. Haga cita con Dr Lorelei Pont para discutir inyeccion de esteroide.  Para las manos, creo que tiene carpal tunnel syndrome. siga ibuporfeno, trate braces para muecas para dormir. Si no mejora dejeme saber para ver el cirugano de manos.  Sndrome del tnel carpiano  (Carpal Tunnel Syndrome)  El tnel carpiano es un espacio estrecho ubicado en el lado palmar de la [place]. Est formado por los huesos de la [place] y los ligamentos. Los nervios, vasos sanguneos y tendones pasan a travs del tnel carpiano. Los movimientos de la [place] o ciertas enfermedades pueden causar hinchazn del tnel. Esta hinchazn comprime el nervio principal en la mueca ((nervio mediano) y ocasiona un trastorno doloroso que se denomina sndrome del tnel carpiano. CAUSAS   Movimientos repetidos de [employer].  Lesiones en la [place].  Ciertas enfermedades como la artritis, la diabetes, el alcoholismo, el hipertiroidismo o la insuficiencia renal.  Obesidad.  Embarazo. SNTOMAS   Sensacin de "pinchazos" en los dedos o la mano.  Hormigueo o entumecimiento en los dedos o en la mano.  Sensacin dolorosa en todo el brazo.  Dolor en la mueca que sube por el brazo hasta el hombro.  Dolor que baja por la mano o los dedos.  Sensacin de [employer]. DIAGNSTICO  El mdico le har una historia clnica y un examen fsico. Puede ser necesario hacer un electromiograma. Esta prueba mide las seales elctricas enviadas por los msculos. Generalmente el paso de las seales elctricas es impedido por el sndrome del tnel carpiano. Tambin puede ser necesario que le tomen radiografas.  TRATAMIENTO  El sndrome del tnel carpiano puede curarse espontneamente sin tratamiento. El [employer] indicar el uso de un cabestrillo para la [place] o que tome medicamentos como antiinflamatorios no esteroides. Las inyecciones de cortisona pueden  ayudar. A veces es necesaria la ciruga para liberar el nervio comprimido.  [place] todos los medicamentos segn le indic su mdico. Slo tome medicamentos de venta libre o recetados para Glass blower/designer, las molestias o bajar la fiebre segn las indicaciones de su mdico.  Si le aconsejaron usar un cabestrillo para evitar que la [place] se doble, selo como le indicaron. Es importante que use el cabestrillo durante la noche. selo mientras sienta dolor o adormecimiento en la mano, el brazo o la [place]. Esto puede durar entre 1 y 2 meses.  Haga reposar la [place] de toda actividad que le cause dolor. Si sus sntomas estn relacionados con Leander Rams, deber conversar con su empleador acerca de la posibilidad de cambiar a una tarea que no requiera el uso de la [place].  Aplique hielo en la mueca despus de los perodos prolongados de [place].  Ponga el hielo en una bolsa plstica.  Colquese una toalla entre la piel y la bolsa de hielo.  Deje el hielo en el lugar durante 15 a 20 minutos, 3 a 4 veces por da.  [place] con todas las visitas de control, segn le indique su mdico. Aqu se incluyen derivaciones a un ortopedista, fisioterapia y rehabilitacin. Elmer Bales en obtener la asistencia necesaria puede dar como resultado una demora o fracaso en la curacin. SOLICITE ATENCIN MDICA DE INMEDIATO SI:   Desarrolla nuevos e inexplicables sntomas.  Los sntomas actuales empeoran y la medicacin no los [place]. ASEGRESE DE QUE:   Comprende estas instrucciones.  Controlar su enfermedad.  Solicitar ayuda de  inmediato si no mejora o si empeora. Document Released: 01/17/2005 Document Revised: 10/12/2011 [facility] Patient Information 2015 [place]. This information is not intended to replace advice given to you by your health care provider. Make sure you discuss any questions you have with your health care provider.

## 2014-05-01 NOTE — Progress Notes (Signed)
   BP 114/70 mmHg  Pulse 60  Temp(Src) 98.1 F (36.7 C) (Oral)   CC: joint pains  Subjective:    Patient ID: Melinda Hall, female    DOB: April 28, 1960, 54 y.o.   MRN: [id number]  HPI: Parissa Chiao is a 54 y.o. female presenting on 05/01/2014 for Pain   Not feeling well over last 2 weeks. Worsening joint pains. Predominant pain in hands with radiation to arms, associated with numbness and cramping of palmar digits (spares thumbs). Also with hip pain worse in the middle of the night. Occasional pain throughout body. Bruised feeling throughout body as well. Mild morning stiffness. Never active synovitis.   Worse lateral hip pain at night time and when laying down and resting. During day with activity no pain.   Has treated with ibuprofen with improvement.   Has had intolerance to bisphosphonate in the past - similar feeling now.   Relevant past medical, surgical, family and social history reviewed and updated as indicated. Interim medical history since our last visit reviewed. Allergies and medications reviewed and updated. Current Outpatient Prescriptions on File Prior to Visit  Medication Sig  . Cholecalciferol (HM VITAMIN D3) 2000 UNITS CAPS Take 1 capsule (2,000 Units total) by mouth daily. (Patient not taking: Reported on 05/01/2014)  . ferrous sulfate 325 (65 FE) MG tablet Take 1 tablet (325 mg total) by mouth 2 (two) times daily. (Patient not taking: Reported on 05/01/2014)  . propranolol (INDERAL) 10 MG tablet Take 1 tablet (10 mg total) by mouth 3 (three) times daily as needed. (Patient not taking: Reported on 05/01/2014)   No current facility-administered medications on file prior to visit.    Review of Systems Per HPI unless specifically indicated above     Objective:    BP 114/70 mmHg  Pulse 60  Temp(Src) 98.1 F (36.7 C) (Oral)  Wt Readings from Last 3 Encounters:  11/26/13 180 lb 8 oz (81.874 kg)  06/20/13 188 lb 8 oz (85.503 kg)  06/06/13 191 lb 4 oz (86.75 kg)      Physical Exam  Constitutional: She is oriented to person, place, and time. She appears well-developed and well-nourished. No distress.  Musculoskeletal: She exhibits no edema.  No pain to palpation of joints today - no active synovitis. No pain with int/ext rotation at hip. Neg FABER. No pain at SIJ, or sciatic notch bilaterally.  ++ pain GTB bilaterally  Neurological: She is alert and oriented to person, place, and time. She has normal strength. No sensory deficit.  + tinel and phalen bilaterally  Skin: Skin is warm and dry.  Nursing note and vitals reviewed.  Results for orders placed or performed in visit on 12/11/13  PPD  Result Value Ref Range   TB Skin Test Negative    Induration 0.0 mm      Assessment & Plan:   Problem List Items Addressed This Visit    Bilateral hip bursitis    Provided with exercises from [facility] pt advisor on trochanteric bursitis. Continue ibuprofen prn. Discussed referral to [facility] to discuss bursitis injection.      Bilateral carpal tunnel syndrome - Primary    Discussed dx and treatment plan - continue ibuprofen 600mg  nightly, add wrist brace for night use. Update if not improved for referral to hand surgery.          Follow up plan: Return if symptoms worsen or fail to improve.

## 2014-05-20 ENCOUNTER — Other Ambulatory Visit: Payer: Self-pay | Admitting: Family Medicine

## 2014-05-24 NOTE — Op Note (Signed)
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   PROCEDURE:  Right submandibular gland resection with lymph node biopsy.   SURGEON:  Nadeen Landau, M.D.   DESCRIPTION OF PROCEDURE:  The patient was identified in the holding area and brought back to the Operating Room and placed in the supine position on the Operating Room table.  After general endotracheal anesthesia had been induced, the patient was turned 90 degrees counterclockwise from anesthesia, placed on a shoulder roll, head turned to the left and a skin crease in the right submandibular triangle was identified marked, locally anesthetized, prepped and draped in the usual fashion.  A #15 blade was used to make an incision through the skin and through the platysma, subplatysmal flaps were elevated superiorly and inferiorly.  The submandibular triangle was then carefully and meticulously dissected reflecting tissue superiorly including the marginal mandibular nerve.  An adjacent lymph node measuring approximately 1.5 x 1.2 cm was excised and sent for frozen section analysis (no carcinomatous changes were identified by the pathologist).  The facial artery and vein were ligated on the underside of the gland and superficially.  The dissection was then carried anterior and under the mylohyoid muscle where the lingual nerve was identified and preserved.  Ligation of the submandibular duct was dissected underneath the lingual nerve as far anterior as could be reached and ligated there.  The wound was then copiously irrigated and closed over a 7 mm Jackson-Pratt drain.  Bacitracin was applied.  The patient was then returned to anesthesia,  allowed to emerge from anesthesia in the Operating Room and taken to the recovery room in stable condition.  There were no complications.  Estimated blood loss 10 mL.    ____________________________ J. Nadeen Landau, MD jmc:ea D: 03/07/2013 21:54:45 ET T: 03/07/2013 23:41:25 ET JOB#: [id number]  cc: Janalee Dane, MD, <Dictator> Nicholos Johns MD ELECTRONICALLY SIGNED 03/25/2013 7:39

## 2014-07-31 ENCOUNTER — Ambulatory Visit (INDEPENDENT_AMBULATORY_CARE_PROVIDER_SITE_OTHER): Payer: BLUE CROSS/BLUE SHIELD | Admitting: Family Medicine

## 2014-07-31 ENCOUNTER — Encounter: Payer: Self-pay | Admitting: Family Medicine

## 2014-07-31 VITALS — BP 114/80 | HR 63 | Temp 98.5°F | Wt 178.5 lb

## 2014-07-31 DIAGNOSIS — G5602 Carpal tunnel syndrome, left upper limb: Secondary | ICD-10-CM | POA: Diagnosis not present

## 2014-07-31 DIAGNOSIS — G5603 Carpal tunnel syndrome, bilateral upper limbs: Secondary | ICD-10-CM

## 2014-07-31 DIAGNOSIS — F4323 Adjustment disorder with mixed anxiety and depressed mood: Secondary | ICD-10-CM | POA: Diagnosis not present

## 2014-07-31 DIAGNOSIS — G5601 Carpal tunnel syndrome, right upper limb: Secondary | ICD-10-CM | POA: Diagnosis not present

## 2014-07-31 DIAGNOSIS — M7071 Other bursitis of hip, right hip: Secondary | ICD-10-CM

## 2014-07-31 DIAGNOSIS — M7072 Other bursitis of hip, left hip: Secondary | ICD-10-CM

## 2014-07-31 NOTE — Assessment & Plan Note (Signed)
rec continued wrist brace use. Consider hand surgery referral for further evaluation of persistent sxs.

## 2014-07-31 NOTE — Assessment & Plan Note (Addendum)
Majority of visit spent discussing stressors at home. Situational anxiety/depression to current marital stressors.  Support provided. rec referral to beh med for counseling. Ideally couples counseling but pt doesn't think husband will agree to this. Pt requests spanish speaking psychologist - will ask for input from [facility] re this.  Discussed pharmacotherapy options but pt remains hesitant for any medication whatsoever - worried about addiction. Discussed addiction only a concern for benzos.

## 2014-07-31 NOTE — Progress Notes (Signed)
Pre visit review using our clinic review tool, if applicable. No additional management support is needed unless otherwise documented below in the visit note. 

## 2014-07-31 NOTE — Assessment & Plan Note (Signed)
R side resolved. Persistent L hip pain - did not evaluate this during office visit but from prior note we did suggest she f/u with Dr Lorelei Pont to discuss steroid injection.

## 2014-07-31 NOTE — Patient Instructions (Signed)
Recomiendo que haga cita con uno de nuestros psicologos (counseling).

## 2014-07-31 NOTE — Progress Notes (Signed)
BP 114/80 mmHg  Pulse 63  Temp(Src) 98.5 F (36.9 C) (Oral)  Wt 178 lb 8 oz (80.967 kg)  SpO2 98%   CC: L hip pain  Subjective:    Patient ID: Melinda Hall, female    DOB: 1960/10/26, 54 y.o.   MRN: [id number]  HPI: Melinda Hall is a 54 y.o. female presenting on 07/31/2014 for Left hip pain   H/o bilat hip bursitis. Previously treated with ibuprofen. Seen here 05/01/2014, treated with [facility] exercises for trochanteric bursitis, and we discussed seeing Dr Lorelei Pont for bursal injection.   Actually R hip has improved. L hip has deteriorated, now radiation down to knee. Worse at night time.   Also seen last visit with bilat CTS. L hand has improved, R hand worsening. She does use bilateral wrist braces which have helped. Persistent paresthesias and cramping.   Staying depressed. Stress at home. Marital stress.   Feels safe at home.   Relevant past medical, surgical, family and social history reviewed and updated as indicated. Interim medical history since our last visit reviewed. Allergies and medications reviewed and updated. Current Outpatient Prescriptions on File Prior to Visit  Medication Sig  . Cholecalciferol (HM VITAMIN D3) 2000 UNITS CAPS Take 1 capsule (2,000 Units total) by mouth daily.  Marland Kitchen ibuprofen (ADVIL,MOTRIN) 600 MG tablet Take 600 mg by mouth at bedtime as needed.  . ferrous sulfate 325 (65 FE) MG tablet Take 1 tablet (325 mg total) by mouth 2 (two) times daily. (Patient not taking: Reported on 05/01/2014)  . propranolol (INDERAL) 10 MG tablet Take 1 tablet (10 mg total) by mouth 3 (three) times daily as needed. (Patient not taking: Reported on 05/01/2014)   No current facility-administered medications on file prior to visit.    Review of Systems Per HPI unless specifically indicated above     Objective:    BP 114/80 mmHg  Pulse 63  Temp(Src) 98.5 F (36.9 C) (Oral)  Wt 178 lb 8 oz (80.967 kg)  SpO2 98%  Wt Readings from Last 3 Encounters:  07/31/14 178 lb  8 oz (80.967 kg)  11/26/13 180 lb 8 oz (81.874 kg)  06/20/13 188 lb 8 oz (85.503 kg)    Physical Exam  Constitutional: She appears well-developed and well-nourished. No distress.  Psychiatric:  Tearful with discussion of stressors  Nursing note and vitals reviewed.     Assessment & Plan:  Over 25 minutes were spent face-to-face with the patient during this encounter and >50% of that time was spent on counseling and coordination of care  Problem List Items Addressed This Visit    Adjustment disorder with mixed anxiety and depressed mood - Primary    Majority of visit spent discussing stressors at home. Situational anxiety/depression to current marital stressors.  Support provided. rec referral to beh med for counseling. Ideally couples counseling but pt doesn't think husband will agree to this. Pt requests spanish speaking psychologist - will ask for input from [facility] re this.  Discussed pharmacotherapy options but pt remains hesitant for any medication whatsoever - worried about addiction. Discussed addiction only a concern for benzos.      Relevant Orders   Ambulatory referral to Psychology   Bilateral carpal tunnel syndrome    rec continued wrist brace use. Consider hand surgery referral for further evaluation of persistent sxs.      Bilateral hip bursitis    R side resolved. Persistent L hip pain - did not evaluate this during office visit but from prior note  we did suggest she f/u with Dr Lorelei Pont to discuss steroid injection.          Follow up plan: Return if symptoms worsen or fail to improve.

## 2014-08-21 ENCOUNTER — Telehealth: Payer: Self-pay

## 2014-08-21 NOTE — Telephone Encounter (Signed)
Left a message for patient to return my call about having a Mammogram set up. Will await call back.  

## 2014-09-05 ENCOUNTER — Telehealth: Payer: Self-pay | Admitting: Family Medicine

## 2014-09-05 NOTE — Telephone Encounter (Signed)
Called several Psychology offices to find [place] speaking counselor for patient. Found Andi Hence who is a Neurosurgeon and accepts [employer] and speaks fluent Spanish. She made the patient an appt on a Friday at 6:30pm b/c the patient works in [place] and needed an appt late in the day. Called the patients daughter and gave her all of the information about her mothers appointment with Ms Vernell Leep. Received a call back after the scheduled appointment that the patient No showed and didn't call to cancel the appointment. Numbers to call to reschedule are [phone] or [phone], the address is [place], [place], must ring door bell to get inside the building. Office has a red front door.

## 2015-04-19 ENCOUNTER — Encounter (HOSPITAL_COMMUNITY): Payer: Self-pay | Admitting: Nurse Practitioner

## 2015-04-19 ENCOUNTER — Emergency Department (INDEPENDENT_AMBULATORY_CARE_PROVIDER_SITE_OTHER): Payer: BLUE CROSS/BLUE SHIELD

## 2015-04-19 ENCOUNTER — Emergency Department (INDEPENDENT_AMBULATORY_CARE_PROVIDER_SITE_OTHER)
Admission: EM | Admit: 2015-04-19 | Discharge: 2015-04-19 | Disposition: A | Discharge status: Home / Self Care | Payer: BLUE CROSS/BLUE SHIELD | Source: Home / Self Care | Attending: Emergency Medicine | Admitting: Emergency Medicine

## 2015-04-19 DIAGNOSIS — J111 Influenza due to unidentified influenza virus with other respiratory manifestations: Secondary | ICD-10-CM

## 2015-04-19 MED ORDER — ALBUTEROL SULFATE HFA 108 (90 BASE) MCG/ACT IN AERS: 1.0000 | INHALATION_SPRAY | Freq: Four times a day (QID) | RESPIRATORY_TRACT | Status: DC | PRN

## 2015-04-19 MED ORDER — AEROCHAMBER PLUS MISC: Status: DC

## 2015-04-19 MED ORDER — IBUPROFEN 800 MG PO TABS
800.0000 mg | ORAL_TABLET | Freq: Three times a day (TID) | ORAL | Status: DC
Start: 2015-04-19 — End: 2015-10-02

## 2015-04-19 MED ORDER — HYDROCODONE-HOMATROPINE 5-1.5 MG/5ML PO SYRP: 5.0000 mL | ORAL_SOLUTION | Freq: Four times a day (QID) | ORAL | Status: DC | PRN

## 2015-04-19 NOTE — ED Notes (Signed)
Pt c/o 3 day history productive cough, fevers, chest congestion, painful inspiration, back pain. She has been taking nyquil with some relief. She is A&ox4, in no acute distress

## 2015-04-19 NOTE — ED Provider Notes (Signed)
HPI  SUBJECTIVE:  Melinda Hall is a 55 y.o. female who presents with cough productive of brownish sputum, fevers Tmax 103. This started 4 days ago. She reports mild rhinorrhea, no nasal congestion, but more malaise, headaches, bodyaches. She reports wheezing. States that she has chest back and neck soreness secondary to cough. Unable to sleep at night secondary to coughing. Reports decreased appetite, but is tolerating by mouth. Symptoms are better with DayQuil, worse with standing for prolonged periods of time. She has not tried anything else for this. No sinus pain/pressure, nausea, vomiting, shortness of breath. No abdominal pain, urinary complaints, neck stiffness, rash, photophobia. Last dose of antipyretic was within the past 6 hours. She did not get a flu shot this year. She works at a daycare. Past medical history negative for asthma, emphysema, COPD, diabetes, hypertension. She is not a smoker. PMD: Dr. Leo Grosser    Past Medical History  Diagnosis Date  . History of cardiac murmur   . Generalized headaches     frequent  . Gastric bypass status for obesity 2004    s/p roux-en-y  . Smoker   . Heart murmur   . Salivary gland stone     x 2  . Dermoid tumor     hx of--left ovary    Past Surgical History  Procedure Laterality Date  . Bilateral oophorectomy  ~1992    for dermoid ovarian cysts  . Cholecystectomy  2000s  . Gastric bypass  2004    roux en y [facility])  . Appendectomy  1980    Family History  Problem Relation Age of Onset  . Cancer Father 38    leukemia  . Hypertension Mother   . Diabetes Neg Hx   . Stroke Neg Hx   . Coronary artery disease Neg Hx     Social History  Substance Use Topics  . Smoking status: Former Smoker -- 0.25 packs/day for 20 years    Types: Cigarettes    Quit date: 10/31/2012  . Smokeless tobacco: Never Used  . Alcohol Use: No    No current facility-administered medications for this encounter.  Current outpatient prescriptions:   .  albuterol (PROVENTIL HFA;VENTOLIN HFA) 108 (90 Base) MCG/ACT inhaler, Inhale 1-2 puffs into the lungs every 6 (six) hours as needed for wheezing or shortness of breath., Disp: 1 Inhaler, Rfl: 0 .  calcium carbonate (OS-CAL) 600 MG TABS tablet, Take 600 mg by mouth daily with breakfast., Disp: , Rfl:  .  Cholecalciferol (HM VITAMIN D3) 2000 UNITS CAPS, Take 1 capsule (2,000 Units total) by mouth daily., Disp: 30 each, Rfl:  .  ferrous sulfate 325 (65 FE) MG tablet, Take 1 tablet (325 mg total) by mouth 2 (two) times daily. (Patient not taking: Reported on 05/01/2014), Disp: , Rfl:  .  HYDROcodone-homatropine (HYCODAN) 5-1.5 MG/5ML syrup, Take 5 mLs by mouth every 6 (six) hours as needed for cough., Disp: 120 mL, Rfl: 0 .  ibuprofen (ADVIL,MOTRIN) 800 MG tablet, Take 1 tablet (800 mg total) by mouth 3 (three) times daily., Disp: 30 tablet, Rfl: 0 .  Spacer/Aero-Holding Chambers (AEROCHAMBER PLUS) inhaler, Use as instructed, Disp: 1 each, Rfl: 2 .  [DISCONTINUED] propranolol (INDERAL) 10 MG tablet, Take 1 tablet (10 mg total) by mouth 3 (three) times daily as needed. (Patient not taking: Reported on 05/01/2014), Disp: 90 tablet, Rfl: 3  No Known Allergies   ROS  As noted in HPI.   Physical Exam  BP 110/72 mmHg  Pulse 81  Temp(Src)  98.9 F (37.2 C) (Oral)  Resp 18  SpO2 100%  Constitutional: Well developed, well nourished, no acute distress Eyes: PERRL, EOMI, conjunctiva normal bilaterally HENT: Normocephalic, atraumatic,mucus membranes moist. TMs normal bilaterally. Normal turbinates. No nasal congestion. No sinus tenderness. Normal oropharynx, no postnasal drip. Neck: No lymphadenopathy, meningismus. Respiratory: Clear to auscultation bilaterally, no rales, no wheezing, no rhonchi Cardiovascular: Normal rate and rhythm, no murmurs, no gallops, no rubs GI: Soft, nondistended, normal bowel sounds, nontender, no rebound, no guarding Back: no CVAT skin: No rash, skin  intact Musculoskeletal: No edema, no tenderness, no deformities Neurologic: Alert & oriented x 3, CN II-XII grossly intact, no motor deficits, sensation grossly intact Psychiatric: Speech and behavior appropriate   ED Course   Medications - No data to display  Orders Placed This Encounter  Procedures  . DG Chest 2 View    Standing Status: Standing     Number of Occurrences: 1     Standing Expiration Date:     Order Specific Question:  Reason for Exam (SYMPTOM  OR DIAGNOSIS REQUIRED)    Answer:  r/o PNA   No results found for this or any previous visit (from the past 24 hour(s)). Dg Chest 2 View  04/19/2015  CLINICAL DATA:  Cough and fever for 2 days. Chest pain related to the coughing. EXAM: CHEST  2 VIEW COMPARISON:  None. FINDINGS: Tortuous thoracic aorta.  Heart size within normal limits. The lungs appear clear. No pleural effusion. No readily apparent rib fracture. IMPRESSION: 1. The lungs appear clear. 2. Tortuous thoracic aorta. Electronically Signed   By: Van Clines M.D.   On: 04/19/2015 20:35    ED Clinical Impression  Influenza   ED Assessment/Plan Presentation consistent with influenza, however, patient is out of the window for Tamiflu. She does not have a pneumonia on x-ray, and no focal lung findings, so we'll withhold antibiotics at this time. Home with Hycodan, albuterol spacer, ibuprofen 800 mg she is to follow-up with her primary care physician in several days. 2 day work note also.  Reviewed labs, imaging independently. No pneumonia. See radiology report for full details.  Discussed imaging, MDM, plan and followup with patient and Discussed sn/sx that should prompt return to the ED. Patient agrees with plan.   *This clinic note was created using Dragon dictation software. Therefore, there may be occasional mistakes despite careful proofreading.  ?  [name], MD 04/19/15 2116

## 2015-04-19 NOTE — Discharge Instructions (Signed)
Take two puffs from your albuterol inhaler every 4 hours. You may take tylenol 1 gram up to 3 times a day as needed for pain. This with the ibuprofen  is an effective combination for pain and fever. Make sure you drink extra fluids. This may take up to an additional 5 or 6 days for you to start feeling completely better, but you should not be feeling worse.  Go to www.goodrx.com to look up your medications. This will give you a list of where you can find your prescriptions at the most affordable prices.

## 2015-10-02 ENCOUNTER — Encounter: Payer: Self-pay | Admitting: Family Medicine

## 2015-10-02 ENCOUNTER — Ambulatory Visit (INDEPENDENT_AMBULATORY_CARE_PROVIDER_SITE_OTHER): Payer: BLUE CROSS/BLUE SHIELD | Admitting: Family Medicine

## 2015-10-02 VITALS — BP 104/80 | HR 58 | Temp 98.1°F | Ht 63.5 in | Wt 186.0 lb

## 2015-10-02 DIAGNOSIS — M7051 Other bursitis of knee, right knee: Secondary | ICD-10-CM | POA: Diagnosis not present

## 2015-10-02 DIAGNOSIS — M542 Cervicalgia: Secondary | ICD-10-CM | POA: Diagnosis not present

## 2015-10-02 DIAGNOSIS — M50121 Cervical disc disorder at C4-C5 level with radiculopathy: Secondary | ICD-10-CM | POA: Insufficient documentation

## 2015-10-02 DIAGNOSIS — Z23 Encounter for immunization: Secondary | ICD-10-CM | POA: Diagnosis not present

## 2015-10-02 MED ORDER — NAPROXEN 500 MG PO TABS: ORAL_TABLET | ORAL | 0 refills | Status: DC

## 2015-10-02 MED ORDER — METHOCARBAMOL 500 MG PO TABS: 250.0000 mg | ORAL_TABLET | Freq: Three times a day (TID) | ORAL | 0 refills | Status: DC | PRN

## 2015-10-02 NOTE — Progress Notes (Signed)
Pre visit review using our clinic review tool, if applicable. No additional management support is needed unless otherwise documented below in the visit note. 

## 2015-10-02 NOTE — Assessment & Plan Note (Signed)
Anticipate R knee pes anserine bursitis as well as lateral inferior patellar bursitis. Treat with heating pad, naprosyn.

## 2015-10-02 NOTE — Progress Notes (Signed)
BP 104/80 (BP Location: Left Arm, Patient Position: Sitting, Cuff Size: Normal)   Pulse (!) 58   Temp 98.1 F (36.7 C) (Oral)   Ht 5' 3.5" (1.613 m)   Wt 186 lb (84.4 kg)   SpO2 98%   BMI 32.43 kg/m    CC: L neck pain, R leg pain Subjective:    Patient ID: Melinda Hall, female    DOB: 1960-11-22, 55 y.o.   MRN: [id number]  HPI: Melinda Hall is a 55 y.o. female presenting on 10/02/2015 for Pain (Some neck pain on left side. Pain in right leg.)   Last seen here 07/2014.   Endorses 1 mo h/o L neck pain that stems from L occipital area, travels down L neck into shoulder nad down elbow. Associated with left sided headaches. Treats with 3-4 ibuprofen in afternoons. Better with relaxation. Some L lateral upper arm numbness  Also noticing 1+ mo h/o cramping and burning at L leg just below knee. Some point tenderness. No significant edema or erythema. Mild warmth intermittently.   Denies inciting trauma/injury.   Relevant past medical, surgical, family and social history reviewed and updated as indicated. Interim medical history since our last visit reviewed. Allergies and medications reviewed and updated. Current Outpatient Prescriptions on File Prior to Visit  Medication Sig  . calcium carbonate (OS-CAL) 600 MG TABS tablet Take 600 mg by mouth daily with breakfast.  . [DISCONTINUED] propranolol (INDERAL) 10 MG tablet Take 1 tablet (10 mg total) by mouth 3 (three) times daily as needed. (Patient not taking: Reported on 05/01/2014)   No current facility-administered medications on file prior to visit.     Review of Systems Per HPI unless specifically indicated in ROS section     Objective:    BP 104/80 (BP Location: Left Arm, Patient Position: Sitting, Cuff Size: Normal)   Pulse (!) 58   Temp 98.1 F (36.7 C) (Oral)   Ht 5' 3.5" (1.613 m)   Wt 186 lb (84.4 kg)   SpO2 98%   BMI 32.43 kg/m   Wt Readings from Last 3 Encounters:  10/02/15 186 lb (84.4 kg)  07/31/14 178 lb 8  oz (81 kg)  11/26/13 180 lb 8 oz (81.9 kg)    Physical Exam  Constitutional: She appears well-developed and well-nourished. No distress.  Musculoskeletal: She exhibits no edema.  No midline cervical neck tenderness. Marked tenderness LEFT occiput at insertion of paracervical neck muscles, marked discomfort to palpation along entire LEFT neck and trapezius mm FROM at shoulders. FROM neck with LEFT neck tightness at RIGHT lateal flexion.  LEFT knee WNL RIGHT knee - tender to palpation at pes anserine bursa, tender to palpation lateral leg inferior to knee. No popliteal fullness.   Skin: Skin is warm and dry. No rash noted. No erythema.  Psychiatric: She has a normal mood and affect.  Nursing note and vitals reviewed.     Assessment & Plan:   Problem List Items Addressed This Visit    Bursitis of right knee    Anticipate R knee pes anserine bursitis as well as lateral inferior patellar bursitis. Treat with heating pad, naprosyn.      Relevant Medications   naproxen (NAPROSYN) 500 MG tablet   methocarbamol (ROBAXIN) 500 MG tablet   Neck pain on left side - Primary    Anticipate paracervical neck mm spasm/tightness. Treat with heating pad, gentle neck exercises, and naprosyn/robaxin with sedation precautions. Update if not improving with treatment. pt agrees.  Other Visit Diagnoses    Need for influenza vaccination       Relevant Orders   Flu Vaccine QUAD 36+ mos PF IM (Fluarix & Fluzone Quad PF) (Completed)       Follow up plan: Return if symptoms worsen or fail to improve.  [name], MD

## 2015-10-02 NOTE — Patient Instructions (Addendum)
creo que tiene bursitis de rodilla derecha y tension muscular del cuello izquierdo. Tome anti inflamatorio naprosyn 500mg  dose veces al dia y robaxin relajante de musculo. cuidado que robaxin puede causar sueo.  Heating pad to neck. Gentle stretching of neck.  Me avisa si no mejora como esperado.

## 2015-10-02 NOTE — Assessment & Plan Note (Signed)
Anticipate paracervical neck mm spasm/tightness. Treat with heating pad, gentle neck exercises, and naprosyn/robaxin with sedation precautions. Update if not improving with treatment. pt agrees.

## 2015-10-15 DIAGNOSIS — Z1239 Encounter for other screening for malignant neoplasm of breast: Secondary | ICD-10-CM | POA: Diagnosis not present

## 2015-10-15 DIAGNOSIS — Z1211 Encounter for screening for malignant neoplasm of colon: Secondary | ICD-10-CM | POA: Diagnosis not present

## 2015-10-15 DIAGNOSIS — Z01419 Encounter for gynecological examination (general) (routine) without abnormal findings: Secondary | ICD-10-CM | POA: Diagnosis not present

## 2015-10-15 DIAGNOSIS — N952 Postmenopausal atrophic vaginitis: Secondary | ICD-10-CM | POA: Diagnosis not present

## 2015-10-16 ENCOUNTER — Other Ambulatory Visit: Payer: Self-pay | Admitting: Obstetrics and Gynecology

## 2015-10-16 ENCOUNTER — Telehealth: Payer: Self-pay | Admitting: Gastroenterology

## 2015-10-16 DIAGNOSIS — Z13820 Encounter for screening for osteoporosis: Secondary | ICD-10-CM

## 2015-10-16 DIAGNOSIS — E28319 Asymptomatic premature menopause: Secondary | ICD-10-CM

## 2015-10-16 NOTE — Telephone Encounter (Signed)
colonoscopy

## 2015-10-20 ENCOUNTER — Other Ambulatory Visit: Payer: Self-pay

## 2015-10-20 NOTE — Telephone Encounter (Signed)
Screening Colonoscopy Z12.11 [facility] [id number] [employer] Pre cert is not required

## 2015-10-20 NOTE — Telephone Encounter (Signed)
Called both [phone] (H) and left voicemails

## 2015-10-20 NOTE — Telephone Encounter (Signed)
Gastroenterology Pre-Procedure Review  Request Date: 11/27/2015 Requesting Physician: Dr. Danise Mina  PATIENT REVIEW QUESTIONS: The patient responded to the following health history questions as indicated:    1. Are you having any GI issues? no 2. Do you have a personal history of Polyps? no 3. Do you have a family history of Colon Cancer or Polyps? no 4. Diabetes Mellitus? no 5. Joint replacements in the past 12 months?no 6. Major health problems in the past 3 months?no 7. Any artificial heart valves, MVP, or defibrillator?no    MEDICATIONS & ALLERGIES:    Patient reports the following regarding taking any anticoagulation/antiplatelet therapy:   Plavix, Coumadin, Eliquis, Xarelto, Lovenox, Pradaxa, Brilinta, or Effient? no Aspirin? no  Patient confirms/reports the following medications:  Current Outpatient Prescriptions  Medication Sig Dispense Refill  . calcium carbonate (OS-CAL) 600 MG TABS tablet Take 600 mg by mouth daily with breakfast.    . naproxen (NAPROSYN) 500 MG tablet Take one po bid x 1 week then prn pain, take with food 40 tablet 0   No current facility-administered medications for this visit.     Patient confirms/reports the following allergies:  No Known Allergies  No orders of the defined types were placed in this encounter.   AUTHORIZATION INFORMATION Primary Insurance: 1D#: Group #:  Secondary Insurance: 1D#: Group #:  SCHEDULE INFORMATION: Date: 11/27/2015  Time: Location: [facility]

## 2015-10-28 ENCOUNTER — Other Ambulatory Visit: Payer: Self-pay | Admitting: Family Medicine

## 2015-10-28 ENCOUNTER — Other Ambulatory Visit: Payer: Self-pay | Admitting: Obstetrics and Gynecology

## 2015-10-28 DIAGNOSIS — Z1231 Encounter for screening mammogram for malignant neoplasm of breast: Secondary | ICD-10-CM

## 2015-11-12 ENCOUNTER — Ambulatory Visit: Payer: BLUE CROSS/BLUE SHIELD

## 2015-11-23 ENCOUNTER — Encounter: Payer: Self-pay | Admitting: *Deleted

## 2015-11-24 ENCOUNTER — Other Ambulatory Visit: Payer: Self-pay

## 2015-11-24 MED ORDER — NA SULFATE-K SULFATE-MG SULF 17.5-3.13-1.6 GM/177ML PO SOLN: 1.0000 | Freq: Once | ORAL | 0 refills | Status: AC

## 2015-11-25 NOTE — Discharge Instructions (Signed)

## 2015-11-27 ENCOUNTER — Encounter
Admission: RE | Disposition: A | Discharge status: Home / Self Care | Payer: Self-pay | Source: Ambulatory Visit | Attending: Gastroenterology

## 2015-11-27 ENCOUNTER — Ambulatory Visit
Admission: RE | Admit: 2015-11-27 | Discharge: 2015-11-27 | Disposition: A | Discharge status: Home / Self Care | Payer: BLUE CROSS/BLUE SHIELD | Source: Ambulatory Visit | Attending: Gastroenterology | Admitting: Gastroenterology

## 2015-11-27 ENCOUNTER — Ambulatory Visit: Payer: BLUE CROSS/BLUE SHIELD | Admitting: Anesthesiology

## 2015-11-27 DIAGNOSIS — K635 Polyp of colon: Secondary | ICD-10-CM

## 2015-11-27 DIAGNOSIS — D125 Benign neoplasm of sigmoid colon: Secondary | ICD-10-CM | POA: Diagnosis not present

## 2015-11-27 DIAGNOSIS — Z1211 Encounter for screening for malignant neoplasm of colon: Secondary | ICD-10-CM

## 2015-11-27 DIAGNOSIS — K641 Second degree hemorrhoids: Secondary | ICD-10-CM | POA: Diagnosis not present

## 2015-11-27 DIAGNOSIS — Z6832 Body mass index (BMI) 32.0-32.9, adult: Secondary | ICD-10-CM | POA: Diagnosis not present

## 2015-11-27 DIAGNOSIS — Z9884 Bariatric surgery status: Secondary | ICD-10-CM | POA: Insufficient documentation

## 2015-11-27 DIAGNOSIS — Z8249 Family history of ischemic heart disease and other diseases of the circulatory system: Secondary | ICD-10-CM | POA: Diagnosis not present

## 2015-11-27 DIAGNOSIS — E669 Obesity, unspecified: Secondary | ICD-10-CM | POA: Insufficient documentation

## 2015-11-27 DIAGNOSIS — Z806 Family history of leukemia: Secondary | ICD-10-CM | POA: Insufficient documentation

## 2015-11-27 DIAGNOSIS — Z87891 Personal history of nicotine dependence: Secondary | ICD-10-CM | POA: Diagnosis not present

## 2015-11-27 DIAGNOSIS — R011 Cardiac murmur, unspecified: Secondary | ICD-10-CM | POA: Diagnosis not present

## 2015-11-27 HISTORY — PX: POLYPECTOMY: SHX5525

## 2015-11-27 HISTORY — PX: COLONOSCOPY WITH PROPOFOL: SHX5780

## 2015-11-27 SURGERY — COLONOSCOPY WITH PROPOFOL
Anesthesia: Monitor Anesthesia Care | Wound class: Contaminated

## 2015-11-27 MED ORDER — STERILE WATER FOR IRRIGATION IR SOLN
Status: DC | PRN
  Administered 2015-11-27: 12:00:00

## 2015-11-27 MED ORDER — LACTATED RINGERS IV SOLN: 500.0000 mL | INTRAVENOUS | Status: DC

## 2015-11-27 MED ORDER — PROPOFOL 10 MG/ML IV BOLUS
INTRAVENOUS | Status: DC | PRN
  Administered 2015-11-27 (×4): 50 mg via INTRAVENOUS
  Administered 2015-11-27: 100 mg via INTRAVENOUS
  Administered 2015-11-27: 50 mg via INTRAVENOUS

## 2015-11-27 MED ORDER — LIDOCAINE HCL (CARDIAC) 20 MG/ML IV SOLN
INTRAVENOUS | Status: DC | PRN
  Administered 2015-11-27: 50 mg via INTRAVENOUS

## 2015-11-27 MED ORDER — LACTATED RINGERS IV SOLN
INTRAVENOUS | Status: DC
  Administered 2015-11-27: 11:00:00 via INTRAVENOUS

## 2015-11-27 SURGICAL SUPPLY — 23 items
CANISTER SUCT 1200ML W/VALVE (MISCELLANEOUS) ×3 IMPLANT
CLIP HMST 235XBRD CATH ROT (MISCELLANEOUS) ×4 IMPLANT
CLIP RESOLUTION 360 11X235 (MISCELLANEOUS) ×2
FCP ESCP3.2XJMB 240X2.8X (MISCELLANEOUS)
FORCEPS BIOP RAD 4 LRG CAP 4 (CUTTING FORCEPS) IMPLANT
FORCEPS BIOP RJ4 240 W/NDL (MISCELLANEOUS)
FORCEPS ESCP3.2XJMB 240X2.8X (MISCELLANEOUS) IMPLANT
GOWN CVR UNV OPN BCK APRN NK (MISCELLANEOUS) ×4 IMPLANT
GOWN ISOL THUMB LOOP REG UNIV (MISCELLANEOUS) ×2
INJECTOR VARIJECT VIN23 (MISCELLANEOUS) IMPLANT
KIT DEFENDO VALVE AND CONN (KITS) IMPLANT
KIT ENDO PROCEDURE OLY (KITS) ×3 IMPLANT
MARKER SPOT ENDO TATTOO 5ML (MISCELLANEOUS) IMPLANT
PAD GROUND ADULT SPLIT (MISCELLANEOUS) ×3 IMPLANT
PROBE APC STR FIRE (PROBE) IMPLANT
RETRIEVER NET ROTH 2.5X230 LF (MISCELLANEOUS) IMPLANT
SNARE SHORT THROW 13M SML OVAL (MISCELLANEOUS) ×3 IMPLANT
SNARE SHORT THROW 30M LRG OVAL (MISCELLANEOUS) IMPLANT
SNARE SNG USE RND 15MM (INSTRUMENTS) IMPLANT
SPOT EX ENDOSCOPIC TATTOO (MISCELLANEOUS)
TRAP ETRAP POLY (MISCELLANEOUS) ×3 IMPLANT
VARIJECT INJECTOR VIN23 (MISCELLANEOUS)
WATER STERILE IRR 250ML POUR (IV SOLUTION) ×3 IMPLANT

## 2015-11-27 NOTE — Transfer of Care (Signed)
Immediate Anesthesia Transfer of Care Note  Patient: Melinda Hall  Procedure(s) Performed: Procedure(s): COLONOSCOPY WITH PROPOFOL (N/A) POLYPECTOMY  Patient Location: PACU  Anesthesia Type: MAC  Level of Consciousness: awake, alert  and patient cooperative  Airway and Oxygen Therapy: Patient Spontanous Breathing and Patient connected to supplemental oxygen  Post-op Assessment: Post-op Vital signs reviewed, Patient's Cardiovascular Status Stable, Respiratory Function Stable, Patent Airway and No signs of Nausea or vomiting  Post-op Vital Signs: Reviewed and stable  Complications: No apparent anesthesia complications

## 2015-11-27 NOTE — Anesthesia Procedure Notes (Signed)
Procedure Name: MAC Performed by: Bilbo Carcamo Pre-anesthesia Checklist: Patient identified, Emergency Drugs available, Suction available, Timeout performed and Patient being monitored Patient Re-evaluated:Patient Re-evaluated prior to inductionOxygen Delivery Method: Nasal cannula Placement Confirmation: positive ETCO2     

## 2015-11-27 NOTE — Op Note (Signed)
[facility] Gastroenterology Patient Name: Melinda Hall Procedure Date: 11/27/2015 11:42 AM MRN: [id number] Account #: [id number] Date of Birth: [birth year] Admit Type: Outpatient Age: 55 Room: [facility] OR ROOM 01 Gender: Female Note Status: Finalized Procedure:            Colonoscopy Indications:          Screening for colorectal malignant neoplasm Providers:            Lucilla Lame MD, MD Referring MD:         [name] (Referring MD) Medicines:            Propofol per Anesthesia Complications:        No immediate complications. Procedure:            Pre-Anesthesia Assessment:                       - Prior to the procedure, a History and Physical was                        performed, and patient medications and allergies were                        reviewed. The patient's tolerance of previous                        anesthesia was also reviewed. The risks and benefits of                        the procedure and the sedation options and risks were                        discussed with the patient. All questions were                        answered, and informed consent was obtained. Prior                        Anticoagulants: The patient has taken no previous                        anticoagulant or antiplatelet agents. ASA Grade                        Assessment: II - A patient with mild systemic disease.                        After reviewing the risks and benefits, the patient was                        deemed in satisfactory condition to undergo the                        procedure.                       After obtaining informed consent, the colonoscope was                        passed under direct vision. Throughout the procedure,  the patient's blood pressure, pulse, and oxygen                        saturations were monitored continuously. The was                        introduced through the anus and advanced to the the                  cecum, identified by appendiceal orifice and ileocecal                        valve. The colonoscopy was performed without                        difficulty. The patient tolerated the procedure well.                        The quality of the bowel preparation was excellent. Findings:      The perianal and digital rectal examinations were normal.      A 10 mm polyp was found in the sigmoid colon. The polyp was sessile. The       polyp was removed with a hot snare. Resection and retrieval were       complete. To prevent bleeding post-intervention, two hemostatic clips       were successfully placed (MR conditional). There was no bleeding at the       end of the procedure.      Non-bleeding internal hemorrhoids were found during retroflexion. The       hemorrhoids were Grade II (internal hemorrhoids that prolapse but reduce       spontaneously). Impression:           - One 10 mm polyp in the sigmoid colon, removed with a                        hot snare. Resected and retrieved. Clips (MR                        conditional) were placed.                       - Non-bleeding internal hemorrhoids. Recommendation:       - Discharge patient to home.                       - Resume previous diet.                       - Continue present medications.                       - Await pathology results.                       - Repeat colonoscopy in 5 years if polyp adenoma and 10                        years if hyperplastic Procedure Code(s):    --- Professional ---                       [phone], Colonoscopy, flexible; with removal of  tumor(s),                        polyp(s), or other lesion(s) by snare technique Diagnosis Code(s):    --- Professional ---                       Z12.11, Encounter for screening for malignant neoplasm                        of colon                       D12.5, Benign neoplasm of sigmoid colon CPT copyright 2016 American Medical Association. All rights  reserved. The codes documented in this report are preliminary and upon coder review may  be revised to meet current compliance requirements. Lucilla Lame MD, MD 11/27/2015 12:06:49 PM This report has been signed electronically. Number of Addenda: 0 Note Initiated On: 11/27/2015 11:42 AM Scope Withdrawal Time: 0 hours 12 minutes 19 seconds  Total Procedure Duration: 0 hours 14 minutes 58 seconds       [facility]

## 2015-11-27 NOTE — H&P (Signed)
Melinda Lame, MD [facility] [address], [place], [place] [address] Phone: [phone] Fax : [phone]  Primary Care Physician:  [name], MD Primary Gastroenterologist:  Dr. Allen Norris  Pre-Procedure History & Physical: HPI:  Melinda Hall is a 55 y.o. female is here for a screening colonoscopy.   Past Medical History:  Diagnosis Date  . Dermoid tumor    hx of--left ovary  . Gastric bypass status for obesity 2004   s/p roux-en-y  . Generalized headaches    frequent  . Heart murmur   . History of cardiac murmur   . Salivary gland stone    x 2  . Smoker     Past Surgical History:  Procedure Laterality Date  . APPENDECTOMY  1980  . BILATERAL OOPHORECTOMY  ~1992   for dermoid ovarian cysts  . CHOLECYSTECTOMY  2000s  . GASTRIC BYPASS  2004   roux en y [facility])    Prior to Admission medications   Medication Sig Start Date End Date Taking? Authorizing Provider  calcium carbonate (OS-CAL) 600 MG TABS tablet Take 600 mg by mouth daily with breakfast.   Yes Historical Provider, MD  naproxen (NAPROSYN) 500 MG tablet Take one po bid x 1 week then prn pain, take with food 10/02/15  Yes [name], MD    Allergies as of 10/20/2015  . (No Known Allergies)    Family History  Problem Relation Age of Onset  . Cancer Father 65    leukemia  . Hypertension Mother   . Diabetes Neg Hx   . Stroke Neg Hx   . Coronary artery disease Neg Hx     Social History   Social History  . Marital status: Married    Spouse name: N/A  . Number of children: N/A  . Years of education: N/A   Occupational History  . Not on file.   Social History Main Topics  . Smoking status: Former Smoker    Packs/day: 0.25    Years: 20.00    Types: Cigarettes    Quit date: 10/31/2012  . Smokeless tobacco: Never Used  . Alcohol use Yes     Comment: 1-2 drinks per month  . Drug use: No  . Sexual activity: Not on file   Other Topics Concern  . 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 Neck:  Supple; no masses or thyromegaly. Lungs:  Clear throughout to auscultation.    Heart:  Regular rate and rhythm. Abdomen:  Soft, nontender and nondistended. Normal bowel sounds, without guarding, and without rebound.   Neurologic:  Alert and  oriented x4;  grossly normal neurologically.  Impression/Plan: Melinda Hall is now here to undergo a screening colonoscopy.  Risks, benefits, and alternatives regarding colonoscopy have been reviewed with the patient.  Questions have been answered.  All  parties agreeable.

## 2015-11-27 NOTE — Anesthesia Postprocedure Evaluation (Signed)
Anesthesia Post Note  Patient: Melinda Hall  Procedure(s) Performed: Procedure(s) (LRB): COLONOSCOPY WITH PROPOFOL (N/A) POLYPECTOMY  Patient location during evaluation: PACU Anesthesia Type: MAC Level of consciousness: awake and alert Pain management: pain level controlled Vital Signs Assessment: post-procedure vital signs reviewed and stable Respiratory status: spontaneous breathing, nonlabored ventilation and respiratory function stable Cardiovascular status: stable and blood pressure returned to baseline Anesthetic complications: no    DANIEL D KOVACS

## 2015-11-27 NOTE — Anesthesia Preprocedure Evaluation (Signed)
Anesthesia Evaluation  Patient identified by MRN, date of birth, ID band Patient awake    Reviewed: Allergy & Precautions, H&P , NPO status , Patient's Chart, lab work & pertinent test results, reviewed documented beta blocker date and time   Airway Mallampati: II  TM Distance: >3 FB Neck ROM: full    Dental no notable dental hx.    Pulmonary former smoker,    Pulmonary exam normal breath sounds clear to auscultation       Cardiovascular Exercise Tolerance: Good + Valvular Problems/Murmurs  Rhythm:regular Rate:Normal     Neuro/Psych  Headaches, PSYCHIATRIC DISORDERS  Neuromuscular disease    GI/Hepatic negative GI ROS, Neg liver ROS,   Endo/Other  negative endocrine ROS  Renal/GU negative Renal ROS  negative genitourinary   Musculoskeletal   Abdominal   Peds  Hematology negative hematology ROS (+)   Anesthesia Other Findings   Reproductive/Obstetrics negative OB ROS                             Anesthesia Physical Anesthesia Plan  ASA: II  Anesthesia Plan: MAC   Post-op Pain Management:    Induction:   Airway Management Planned:   Additional Equipment:   Intra-op Plan:   Post-operative Plan:   Informed Consent: I have reviewed the patients History and Physical, chart, labs and discussed the procedure including the risks, benefits and alternatives for the proposed anesthesia with the patient or authorized representative who has indicated his/her understanding and acceptance.     Plan Discussed with: CRNA  Anesthesia Plan Comments:         Anesthesia Quick Evaluation

## 2015-11-30 ENCOUNTER — Encounter: Payer: Self-pay | Admitting: Gastroenterology

## 2015-12-01 ENCOUNTER — Encounter: Payer: Self-pay | Admitting: Gastroenterology

## 2015-12-01 ENCOUNTER — Ambulatory Visit
Admission: RE | Admit: 2015-12-01 | Discharge: 2015-12-01 | Disposition: A | Discharge status: Home / Self Care | Payer: BLUE CROSS/BLUE SHIELD | Source: Ambulatory Visit | Attending: Obstetrics and Gynecology | Admitting: Obstetrics and Gynecology

## 2015-12-01 ENCOUNTER — Ambulatory Visit
Admission: RE | Admit: 2015-12-01 | Discharge: 2015-12-01 | Disposition: A | Discharge status: Home / Self Care | Payer: BLUE CROSS/BLUE SHIELD | Source: Ambulatory Visit | Attending: Family Medicine | Admitting: Family Medicine

## 2015-12-01 DIAGNOSIS — M810 Age-related osteoporosis without current pathological fracture: Secondary | ICD-10-CM | POA: Insufficient documentation

## 2015-12-01 DIAGNOSIS — Z13820 Encounter for screening for osteoporosis: Secondary | ICD-10-CM | POA: Insufficient documentation

## 2015-12-01 DIAGNOSIS — Z1231 Encounter for screening mammogram for malignant neoplasm of breast: Secondary | ICD-10-CM | POA: Diagnosis not present

## 2015-12-01 DIAGNOSIS — E28319 Asymptomatic premature menopause: Secondary | ICD-10-CM

## 2015-12-01 DIAGNOSIS — M818 Other osteoporosis without current pathological fracture: Secondary | ICD-10-CM | POA: Diagnosis not present

## 2015-12-02 ENCOUNTER — Encounter: Payer: Self-pay | Admitting: Gastroenterology

## 2015-12-02 ENCOUNTER — Encounter: Payer: Self-pay | Admitting: *Deleted

## 2015-12-02 LAB — HM MAMMOGRAPHY

## 2015-12-06 ENCOUNTER — Encounter: Payer: Self-pay | Admitting: Family Medicine

## 2015-12-14 DIAGNOSIS — M810 Age-related osteoporosis without current pathological fracture: Secondary | ICD-10-CM | POA: Diagnosis not present

## 2015-12-15 DIAGNOSIS — M810 Age-related osteoporosis without current pathological fracture: Secondary | ICD-10-CM | POA: Diagnosis not present

## 2016-03-03 DIAGNOSIS — M810 Age-related osteoporosis without current pathological fracture: Secondary | ICD-10-CM | POA: Insufficient documentation

## 2016-03-03 HISTORY — DX: Age-related osteoporosis without current pathological fracture: M81.0

## 2016-03-23 DIAGNOSIS — M810 Age-related osteoporosis without current pathological fracture: Secondary | ICD-10-CM | POA: Diagnosis not present

## 2016-03-25 ENCOUNTER — Encounter: Payer: Self-pay | Admitting: Family Medicine

## 2016-06-15 ENCOUNTER — Encounter: Payer: Self-pay | Admitting: Gynecology

## 2016-07-27 ENCOUNTER — Ambulatory Visit
Admission: RE | Admit: 2016-07-27 | Discharge: 2016-07-27 | Disposition: A | Discharge status: Home / Self Care | Payer: BLUE CROSS/BLUE SHIELD | Source: Ambulatory Visit | Attending: Family Medicine | Admitting: Family Medicine

## 2016-07-27 ENCOUNTER — Ambulatory Visit (INDEPENDENT_AMBULATORY_CARE_PROVIDER_SITE_OTHER): Payer: BLUE CROSS/BLUE SHIELD | Admitting: Family Medicine

## 2016-07-27 ENCOUNTER — Ambulatory Visit: Payer: BLUE CROSS/BLUE SHIELD | Admitting: Family Medicine

## 2016-07-27 ENCOUNTER — Telehealth: Payer: Self-pay

## 2016-07-27 ENCOUNTER — Encounter: Payer: Self-pay | Admitting: Family Medicine

## 2016-07-27 ENCOUNTER — Ambulatory Visit (INDEPENDENT_AMBULATORY_CARE_PROVIDER_SITE_OTHER)
Admission: RE | Admit: 2016-07-27 | Discharge: 2016-07-27 | Disposition: A | Discharge status: Home / Self Care | Payer: BLUE CROSS/BLUE SHIELD | Source: Ambulatory Visit | Attending: Family Medicine | Admitting: Family Medicine

## 2016-07-27 VITALS — BP 114/68 | HR 70 | Temp 98.1°F | Wt 182.8 lb

## 2016-07-27 DIAGNOSIS — M4802 Spinal stenosis, cervical region: Secondary | ICD-10-CM | POA: Diagnosis not present

## 2016-07-27 DIAGNOSIS — M5030 Other cervical disc degeneration, unspecified cervical region: Secondary | ICD-10-CM | POA: Insufficient documentation

## 2016-07-27 DIAGNOSIS — G959 Disease of spinal cord, unspecified: Secondary | ICD-10-CM | POA: Insufficient documentation

## 2016-07-27 DIAGNOSIS — IMO0001 Reserved for inherently not codable concepts without codable children: Secondary | ICD-10-CM | POA: Insufficient documentation

## 2016-07-27 DIAGNOSIS — M50223 Other cervical disc displacement at C6-C7 level: Secondary | ICD-10-CM | POA: Diagnosis not present

## 2016-07-27 DIAGNOSIS — R209 Unspecified disturbances of skin sensation: Secondary | ICD-10-CM | POA: Insufficient documentation

## 2016-07-27 DIAGNOSIS — H5702 Anisocoria: Secondary | ICD-10-CM | POA: Diagnosis not present

## 2016-07-27 DIAGNOSIS — M542 Cervicalgia: Secondary | ICD-10-CM

## 2016-07-27 NOTE — Telephone Encounter (Signed)
Pt walked in; for 2 weeks has numbness and pain (pain level now 6 which comes and goes) starting at back of neck and goes down lt arm to elbow, no weakness or loss of strength in lt arm or hand; 2 days ago numbness in lt foot that goes up to knee, no pain in lt foot or leg.pt does feel some weakness in lt leg.No CP, SOB,H/A, dizziness, blurred vision. Pt states her head does feel heavy. No speech problems and no facial drooping. Pt has not had any injury or fall. BP 130/76. Advised Dr Darnell Level who does not have available appt and said ok to schedule with Dr Lorelei Pont today at 10:15. 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 [place] CMA has EKG. FYI to Dr Lorelei Pont.

## 2016-07-27 NOTE — Telephone Encounter (Signed)
noted 

## 2016-07-27 NOTE — Progress Notes (Addendum)
BP 114/68   Pulse 70   Temp 98.1 F (36.7 C) (Oral)   Wt 182 lb 12 oz (82.9 kg)   SpO2 96%   BMI 32.37 kg/m    CC: L arm and leg numbness Subjective:    Patient ID: Melinda Hall, female    DOB: 1960/08/21, 56 y.o.   MRN: [id number]  HPI: Analyah Hall is a 56 y.o. female presenting on 07/27/2016 for Numbness (left leg and foot x2d. left shoulder and arm x2wks)   2 wk h/o L neck paresthesias with radiation down posterior shoulder, arm to L elbow associated with intermittent sharp stabbing pain L neck. Over last 2 days noticing mid sole paresthesias and numbness traveling up to posterior knee. Leg symptoms not associated with pain. Strength has not been affected.   No known inciting trauma/injury.   Brings EKG showing NSR rate 60s, normal axis and intervals, no acute ST/T changes, T inversions III unchanged from prior EKG 2014.   No stroke symptoms of AMS, facial droop, headache, vision changes, weakness, chest pain, dyspnea.   H/o bilateral carpal tunnel syndrome that has been controlled with wrist brace use.   Relevant past medical, surgical, family and social history reviewed and updated as indicated. Interim medical history since our last visit reviewed. Allergies and medications reviewed and updated. Outpatient Medications Prior to Visit  Medication Sig Dispense Refill  . calcium carbonate (OS-CAL) 600 MG TABS tablet Take 600 mg by mouth daily with breakfast.    . naproxen (NAPROSYN) 500 MG tablet Take one po bid x 1 week then prn pain, take with food 40 tablet 0   No facility-administered medications prior to visit.      Per HPI unless specifically indicated in ROS section below Review of Systems     Objective:    BP 114/68   Pulse 70   Temp 98.1 F (36.7 C) (Oral)   Wt 182 lb 12 oz (82.9 kg)   SpO2 96%   BMI 32.37 kg/m   Wt Readings from Last 3 Encounters:  07/27/16 182 lb 12 oz (82.9 kg)  11/27/15 182 lb (82.6 kg)  10/02/15 186 lb (84.4 kg)      Physical Exam  Constitutional: She is oriented to person, place, and time. She appears well-developed and well-nourished. No distress.  HENT:  Mouth/Throat: Oropharynx is clear and moist. No oropharyngeal exudate.  Eyes: Conjunctivae and EOM are normal. No scleral icterus.  Anisocoria R dilated pupil, chronic per patient  Neck: Normal range of motion. Neck supple. No thyromegaly present.  Cardiovascular: Normal rate, regular rhythm, normal heart sounds and intact distal pulses.   No murmur heard. Pulmonary/Chest: Effort normal and breath sounds normal. No respiratory distress. She has no wheezes. She has no rales.  Musculoskeletal: She exhibits no edema.  FROM at neck with discomfort at lateral flexion and rotation worse on left, FROM at shoulders without pain Tender to palpation along L lateral neck into L trapezius  Lymphadenopathy:    She has no cervical adenopathy.  Neurological: She is alert and oriented to person, place, and time. She displays no atrophy. A sensory deficit is present. No cranial nerve deficit. She exhibits normal muscle tone. Gait abnormal. Coordination normal.  CN 2-12 intact FTN intact EOMI + spurling on left 4/5 strength LUE, LLE compared to right Diminished grip strength on right Decreased sensation to light touch and temperature sensation along entire L upper arm, preserved at L forearm, again diminished along entire L palmar/dorsal hand Decreased  sensation to light touch and temperature posterior L lower leg as well as L foot at sole Difficulty with heel and toe walking on left foot 2/2 pain and weakness Diminished DTRs throughout  Skin: Skin is warm and dry. No rash noted.  Psychiatric: She has a normal mood and affect.  Nursing note and vitals reviewed.      Assessment & Plan:   Problem List Items Addressed This Visit    Anisocoria    Left pupil dilation chronic per patient.      Disorder of intervertebral disc at C4-C5 level with radiculopathy     Acute left neck pain with some concern for associated cervical myelopathy, now with 2d of associated left leg paresthesias and numbness/weakness. Check cervical films and MRI. See below.       Paresthesias/numbness - Primary    Not consistent with CVA. Concern for cervical myelopathy with pain, paresthesias with loss of sensation, and objective left arm/leg weakness. Check cervical films then cervical MR without contrast. If unrevealing, consider further eval with MR lumbar vs MRI brain to eval for demyelinating disease. Consider prednisone course and neurology referral.       Relevant Orders   DG Cervical Spine Complete (Completed)   MR Cervical Spine Wo Contrast (Completed)       Follow up plan: Return if symptoms worsen or fail to improve.  [name], MD

## 2016-07-27 NOTE — Assessment & Plan Note (Addendum)
Acute left neck pain with some concern for associated cervical myelopathy, now with 2d of associated left leg paresthesias and numbness/weakness. Check cervical films and MRI. See below.

## 2016-07-27 NOTE — Patient Instructions (Addendum)
Rayos x hoy. Luego vea a [place] para hacer cita para resonancia de cuello.  La llamaremos con [employer]. Si sintomas empeoran, seek urgent care.

## 2016-07-27 NOTE — Assessment & Plan Note (Signed)
Not consistent with CVA. Concern for cervical myelopathy with pain, paresthesias with loss of sensation, and objective left arm/leg weakness. Check cervical films then cervical MR without contrast. If unrevealing, consider further eval with MR lumbar vs MRI brain to eval for demyelinating disease. Consider prednisone course and neurology referral.

## 2016-07-28 ENCOUNTER — Encounter: Payer: Self-pay | Admitting: Family Medicine

## 2016-07-28 ENCOUNTER — Other Ambulatory Visit: Payer: Self-pay | Admitting: Family Medicine

## 2016-07-28 DIAGNOSIS — R209 Unspecified disturbances of skin sensation: Secondary | ICD-10-CM

## 2016-07-28 DIAGNOSIS — IMO0001 Reserved for inherently not codable concepts without codable children: Secondary | ICD-10-CM

## 2016-07-28 DIAGNOSIS — M5416 Radiculopathy, lumbar region: Secondary | ICD-10-CM | POA: Insufficient documentation

## 2016-07-28 DIAGNOSIS — H5702 Anisocoria: Secondary | ICD-10-CM | POA: Insufficient documentation

## 2016-07-28 DIAGNOSIS — M50121 Cervical disc disorder at C4-C5 level with radiculopathy: Secondary | ICD-10-CM

## 2016-07-28 MED ORDER — PREDNISONE 20 MG PO TABS: ORAL_TABLET | ORAL | 0 refills | Status: DC

## 2016-07-28 NOTE — Assessment & Plan Note (Signed)
Left pupil dilation chronic per patient.

## 2016-07-29 ENCOUNTER — Ambulatory Visit
Admission: RE | Admit: 2016-07-29 | Discharge: 2016-07-29 | Disposition: A | Discharge status: Home / Self Care | Payer: BLUE CROSS/BLUE SHIELD | Source: Ambulatory Visit | Attending: Family Medicine | Admitting: Family Medicine

## 2016-07-29 DIAGNOSIS — M5116 Intervertebral disc disorders with radiculopathy, lumbar region: Secondary | ICD-10-CM | POA: Diagnosis not present

## 2016-07-29 DIAGNOSIS — R209 Unspecified disturbances of skin sensation: Secondary | ICD-10-CM | POA: Insufficient documentation

## 2016-07-29 DIAGNOSIS — IMO0001 Reserved for inherently not codable concepts without codable children: Secondary | ICD-10-CM

## 2016-07-29 DIAGNOSIS — M5416 Radiculopathy, lumbar region: Secondary | ICD-10-CM

## 2016-07-29 DIAGNOSIS — M5126 Other intervertebral disc displacement, lumbar region: Secondary | ICD-10-CM | POA: Diagnosis not present

## 2016-08-01 ENCOUNTER — Ambulatory Visit: Payer: BLUE CROSS/BLUE SHIELD | Admitting: Family Medicine

## 2016-08-24 ENCOUNTER — Encounter: Payer: Self-pay | Admitting: Family Medicine

## 2016-08-24 DIAGNOSIS — M5021 Other cervical disc displacement,  high cervical region: Secondary | ICD-10-CM | POA: Diagnosis not present

## 2016-08-24 DIAGNOSIS — M5126 Other intervertebral disc displacement, lumbar region: Secondary | ICD-10-CM | POA: Diagnosis not present

## 2016-08-24 DIAGNOSIS — R030 Elevated blood-pressure reading, without diagnosis of hypertension: Secondary | ICD-10-CM | POA: Diagnosis not present

## 2016-08-24 DIAGNOSIS — Z6830 Body mass index (BMI) 30.0-30.9, adult: Secondary | ICD-10-CM | POA: Diagnosis not present

## 2016-08-25 MED ORDER — METHOCARBAMOL 500 MG PO TABS: 500.0000 mg | ORAL_TABLET | Freq: Three times a day (TID) | ORAL | 1 refills | Status: DC | PRN

## 2016-08-25 NOTE — Telephone Encounter (Signed)
Looks like medication was last filled 10/2015... Please advise

## 2016-08-30 DIAGNOSIS — M542 Cervicalgia: Secondary | ICD-10-CM | POA: Diagnosis not present

## 2016-09-01 DIAGNOSIS — M542 Cervicalgia: Secondary | ICD-10-CM | POA: Diagnosis not present

## 2016-09-06 DIAGNOSIS — M542 Cervicalgia: Secondary | ICD-10-CM | POA: Diagnosis not present

## 2016-09-08 DIAGNOSIS — M542 Cervicalgia: Secondary | ICD-10-CM | POA: Diagnosis not present

## 2016-09-13 DIAGNOSIS — M542 Cervicalgia: Secondary | ICD-10-CM | POA: Diagnosis not present

## 2016-09-20 DIAGNOSIS — M542 Cervicalgia: Secondary | ICD-10-CM | POA: Diagnosis not present

## 2016-09-28 DIAGNOSIS — Z6830 Body mass index (BMI) 30.0-30.9, adult: Secondary | ICD-10-CM | POA: Diagnosis not present

## 2016-09-28 DIAGNOSIS — M50221 Other cervical disc displacement at C4-C5 level: Secondary | ICD-10-CM | POA: Diagnosis not present

## 2016-10-19 ENCOUNTER — Ambulatory Visit (INDEPENDENT_AMBULATORY_CARE_PROVIDER_SITE_OTHER): Payer: BLUE CROSS/BLUE SHIELD | Admitting: Family Medicine

## 2016-10-19 ENCOUNTER — Encounter: Payer: Self-pay | Admitting: Family Medicine

## 2016-10-19 VITALS — BP 116/76 | HR 68 | Temp 98.0°F | Ht 63.0 in | Wt 186.5 lb

## 2016-10-19 DIAGNOSIS — M50121 Cervical disc disorder at C4-C5 level with radiculopathy: Secondary | ICD-10-CM | POA: Diagnosis not present

## 2016-10-19 DIAGNOSIS — M810 Age-related osteoporosis without current pathological fracture: Secondary | ICD-10-CM | POA: Diagnosis not present

## 2016-10-19 DIAGNOSIS — Z0000 Encounter for general adult medical examination without abnormal findings: Secondary | ICD-10-CM

## 2016-10-19 DIAGNOSIS — E611 Iron deficiency: Secondary | ICD-10-CM

## 2016-10-19 DIAGNOSIS — Z01419 Encounter for gynecological examination (general) (routine) without abnormal findings: Secondary | ICD-10-CM

## 2016-10-19 DIAGNOSIS — Z13220 Encounter for screening for lipoid disorders: Secondary | ICD-10-CM | POA: Diagnosis not present

## 2016-10-19 DIAGNOSIS — E669 Obesity, unspecified: Secondary | ICD-10-CM

## 2016-10-19 DIAGNOSIS — M5416 Radiculopathy, lumbar region: Secondary | ICD-10-CM | POA: Diagnosis not present

## 2016-10-19 DIAGNOSIS — H5702 Anisocoria: Secondary | ICD-10-CM

## 2016-10-19 DIAGNOSIS — Z23 Encounter for immunization: Secondary | ICD-10-CM | POA: Diagnosis not present

## 2016-10-19 DIAGNOSIS — Z9884 Bariatric surgery status: Secondary | ICD-10-CM

## 2016-10-19 DIAGNOSIS — E66811 Obesity, class 1: Secondary | ICD-10-CM

## 2016-10-19 MED ORDER — VITAMIN D3 25 MCG (1000 UT) PO CAPS: 3.0000 | ORAL_CAPSULE | Freq: Every day | ORAL | Status: AC

## 2016-10-19 MED ORDER — METHOCARBAMOL 500 MG PO TABS: 500.0000 mg | ORAL_TABLET | Freq: Three times a day (TID) | ORAL | 1 refills | Status: DC | PRN

## 2016-10-19 NOTE — Assessment & Plan Note (Signed)
Encouraged healthy diet choices.  

## 2016-10-19 NOTE — Assessment & Plan Note (Signed)
Upcoming ACDF likely 01/2016.  Letter for work provided today - recommend avoid heavy lifting >20 lbs.

## 2016-10-19 NOTE — Patient Instructions (Addendum)
Flu shot today Tdap today We will refer you to GYN at [place] y vitamina D 4000 unidades diarias.  carta para trabajo.   Melinda Hall (Health Maintenance, Female) Un estilo de vida saludable y los cuidados preventivos pueden favorecer considerablemente a la salud y Musician. Pregunte a su mdico cul es el cronograma de exmenes peridicos apropiado para usted. Esta es una buena oportunidad para consultarlo sobre cmo prevenir enfermedades y [place] sano. Adems de los controles, hay muchas otras cosas que puede hacer usted mismo. Los expertos han realizado numerosas investigaciones [employer] cambios en el estilo de vida y las medidas de prevencin que, [place], lo ayudarn a mantenerse sano. Solicite a su mdico ms informacin. EL PESO Y LA DIETA Consuma una dieta saludable.  Asegrese de [employer] verduras, frutas, productos lcteos de bajo contenido de [place] y Advertising account planner.  No consuma muchos alimentos de alto contenido de grasas slidas, azcares agregados o sal.  Realice actividad fsica con regularidad. Esta es una de las prcticas ms importantes que puede hacer por su salud. ? La [name] de los adultos deben hacer ejercicio durante al menos 11mnutos por semana. El ejercicio debe aumentar la frecuencia cardaca y pActorla transpiracin (ejercicio de [place]. ? La mayora de los adultos tambin deben hacer ejercicios de elongacin al mToysRusveces a la semana. Agregue esto al su plan de ejercicio de intensidad moderada. Mantenga un peso saludable.  El ndice de masa corporal ([facility] es una medida que puede utilizarse para identificar posibles problemas de pWanda Proporciona una estimacin de la grasa corporal basndose en el peso y la altura. Su mdico puede ayudarle a dRadiation protection practitionerIChesapeakey a lScientist, forensico mTheatre managerun peso saludable.  Para las mujeres de 20aos o ms: ? Un IMarion Il Va Medical Centermenor de 18,5  se considera bajo peso. ? Un IEye Surgery Center Of Middle Tennesseeentre 18,5 y 24,9 es normal. ? Un IPacific Northwest Urology Surgery Centerentre 25 y 29,9 se considera sobrepeso. ? Un IMC de 30 o ms se considera obesidad. Observe los niveles de colesterol y lpidos en la sangre.  Debe comenzar a rEnglish as a second language teacherde lpidos y cResearch officer, trade unionen la sangre a los 20aos y luego repetirlos cada 512aos  Es posible que nAutomotive engineerlos niveles de colesterol con mayor frecuencia si: ? Sus niveles de lpidos y colesterol son altos. ? Es mayor de [id number] ? Presenta un alto riesgo de padecer enfermedades cardacas. DETECCIN DE CNCER Cncer de pulmn  Se recomienda realizar exmenes de deteccin de cncer de pulmn a personas adultas entre 561y 84aos que estn en riesgo de dHorticulturist, commercialde pulmn por sus antecedentes de consumo de tabaco.  Se recomienda una tomografa computarizada de baja dosis de los pulmones todos los aos a las personas que: ? Fuman actualmente. ? Hayan dejado el hbito en algn momento en los ltimos 15aos. ? Hayan fumado durante 30aos un paquete diario. Un paquete-ao equivale a fumar un promedio de un paquete de cigarrillos diario durante un ao.  Los exmenes de deteccin anuales deben continuar hasta que hayan pasado 15aos desde que dej de fumar.  Ya no debern realizarse si tiene un problema de salud que le impida recibir tratamiento para eScience writerde pulmn. Cncer de mama  Practique la autoconciencia de la mama. Esto significa reconocer la apariencia normal de sus mamas y cmo las siente.  Tambin significa realizar autoexmenes regulares de lJohnson & Johnson Informe a su mdico sobre cualquier cambio, sin importar cun  pequeo sea.  Si tiene entre 20 y 80 aos, un mdico debe realizarle un examen clnico de las mamas como parte del examen regular de [place], cada 1 a 3aos.  Si tiene 40aos o ms, debe Information systems manager clnico de las Microsoft. Tambin considere realizarse una [place]  ([place]) todos los [place].  Si tiene antecedentes familiares de cncer de mama, hable con su mdico para someterse a un estudio gentico.  Si tiene alto riesgo de Chief Financial Officer de mama, hable con su mdico para someterse a Public house manager y [employer].  La evaluacin del gen del cncer de mama (BRCA) se recomienda a mujeres que tengan familiares con cnceres relacionados con el BRCA. Los cnceres relacionados con el BRCA incluyen los siguientes: ? [place]. ? Ovario. ? Trompas. ? Cnceres de peritoneo.  Los resultados de la evaluacin determinarn la necesidad de asesoramiento gentico y de [place] de BRCA1 y BRCA2. Cncer de cuello del tero El mdico puede recomendarle que se haga pruebas peridicas de deteccin de cncer de los rganos de la pelvis (ovarios, tero y vagina). Estas pruebas incluyen un examen plvico, que abarca controlar si se produjeron cambios microscpicos en la superficie del cuello del tero (prueba de Papanicolaou). Pueden recomendarle que se haga estas pruebas cada 3aos, a partir de los 21aos.  A las mujeres que tienen entre 30 y 18aos, los mdicos pueden recomendarles que se sometan a exmenes plvicos y pruebas de Papanicolaou cada 33aos, o a la prueba de Papanicolaou y el examen plvico en combinacin con estudios de deteccin del virus del papiloma humano (VPH) cada 5aos. Algunos tipos de VPH aumentan el riesgo de Chief Financial Officer de cuello del tero. La prueba para la deteccin del VPH tambin puede realizarse a mujeres de cualquier edad cuyos resultados de la prueba de Papanicolaou no sean claros.  Es posible que otros mdicos no recomienden exmenes de deteccin a mujeres no embarazadas que se consideran sujetos de bajo riesgo de Chief Financial Officer de pelvis y que no tienen sntomas. Pregntele al mdico si un examen plvico de deteccin es adecuado para usted.  Si ha recibido un tratamiento para Science writer cervical o una enfermedad  que podra causar cncer, necesitar realizarse una prueba de Papanicolaou y controles durante al menos 65 aos de concluido el [place]. Si no se ha hecho el Papanicolaou con regularidad, debern volver a evaluarse los factores de riesgo (como tener un nuevo compaero sexual), para Teacher, adult education si debe realizarse los estudios nuevamente. Algunas mujeres sufren problemas mdicos que aumentan la probabilidad de Museum/gallery curator cncer de cuello del tero. En estos casos, el mdico podr QUALCOMM se realicen controles y pruebas de Papanicolaou con ms frecuencia. Cncer colorrectal  Este tipo de cncer puede detectarse y a menudo prevenirse.  Por lo general, los estudios de rutina se deben Medical laboratory scientific officer a Field seismologist a Proofreader de los 12 aos y [place] 39 aos.  Sin embargo, el mdico podr aconsejarle que lo haga antes, si tiene factores de riesgo para el cncer de colon.  Tambin puede recomendarle que use un kit de prueba para Hydrologist en la materia fecal.  Es posible que se use una pequea cmara en el extremo de un tubo para examinar directamente el colon (sigmoidoscopia o colonoscopia) a fin de Hydrographic surveyor formas tempranas de cncer colorrectal.  Los exmenes de rutina generalmente comienzan a los 32aos.  El examen directo del colon se debe repetir cada 5 a 10aos hasta los  75aos. Sin embargo, es posible que se realicen exmenes con mayor frecuencia, si se detectan formas tempranas de plipos precancerosos o pequeos bultos. Cncer de piel  Revise la piel de la cabeza a los pies con regularidad.  Informe a su mdico si aparecen nuevos lunares o los que tiene se modifican, especialmente en su forma y color.  Tambin notifique al mdico si tiene un lunar que es ms grande que el tamao de una goma de lpiz.  Siempre use pantalla solar. Aplique pantalla solar de Kerry Dory y repetida a lo largo del Training and development officer.  Protjase usando mangas y [employer], un sombrero de ala ancha y gafas para el  sol, siempre que se encuentre en el exterior. ENFERMEDADES CARDACAS, DIABETES E HIPERTENSIN ARTERIAL  La hipertensin arterial causa enfermedades cardacas y [place] el riesgo de ictus. La hipertensin arterial es ms probable en los siguientes casos: ? Las personas que tienen la presin arterial en el extremo del rango normal (100-139/85-89 mm Hg). ? [employer] con sobrepeso u obesidad. ? Scientist, water quality.  Si usted tiene entre 18 y 39 aos, debe medirse la presin arterial cada 3 a 5 aos. Si usted tiene 40 aos o ms, debe medirse la presin arterial Hewlett[employer]ard. Debe medirse la presin arterial dos veces: una vez cuando est en un hospital o una clnica y la otra vez cuando est en otro sitio. Registre el promedio de [employer]. Para controlar su presin arterial cuando no est en un hospital o [place], puede usar lo siguiente: ? Jorje Guild automtica para medir la presin arterial en una farmacia. ? Un monitor para medir la presin arterial en el hogar.  Si tiene entre 42 y 77 aos, consulte a su mdico si debe tomar aspirina para prevenir el ictus.  Realcese exmenes de deteccin de la diabetes con regularidad. Esto incluye la toma de [place] de sangre para controlar el nivel de azcar en la sangre durante el [place]. ? Si tiene un peso normal y un bajo riesgo de padecer diabetes, realcese este anlisis cada tres aos despus de los 45aos. ? Si tiene sobrepeso y un alto riesgo de padecer diabetes, considere someterse a este anlisis antes o con mayor frecuencia. PREVENCIN DE INFECCIONES HepatitisB  Si tiene un riesgo ms alto de Museum/gallery curator hepatitis B, debe someterse a un examen de deteccin de este virus. Se considera que tiene un alto riesgo de contraer hepatitis B si: ? Naci en un pas donde la hepatitis B es frecuente. Pregntele a su mdico qu pases son considerados de Public affairs consultant. ? Sus padres nacieron en un pas de alto riesgo y usted no  recibi una vacuna que lo proteja contra la hepatitis B (vacuna contra la hepatitis B). ? [place]. ? [place] agujas para inyectarse drogas. ? Vive con alguien que tiene hepatitis B. ? Ha tenido sexo con alguien que tiene hepatitis B. ? Recibe tratamiento de hemodilisis. ? Toma ciertos medicamentos para el cncer, trasplante de rganos y afecciones autoinmunitarias. Hepatitis C  Se recomienda un anlisis de [place] para: ? [employer] 1945 y 1965. ? Todas las personas que tengan un riesgo de haber contrado hepatitis C. Enfermedades de transmisin sexual (ETS).  Debe realizarse pruebas de deteccin de enfermedades de transmisin sexual (ETS), incluidas gonorrea y clamidia si: ? Es sexualmente activo y es menor de 37JIR. ? Es mayor de 24aos, y Investment banker, operational informa que corre riesgo de tener este tipo de infecciones. ?  La actividad sexual ha cambiado desde que le hicieron la ltima prueba de deteccin y tiene un riesgo mayor de Best boy clamidia o Radio broadcast assistant. Pregntele al mdico si usted tiene riesgo.  Si no tiene el VIH, pero corre riesgo de infectarse por el virus, se recomienda tomar diariamente un medicamento recetado para evitar la infeccin. Esto se conoce como profilaxis previa a la exposicin. Se considera que est en riesgo si: ? Es [place] sexualmente y no [place] preservativos habitualmente o no conoce el estado del VIH de sus Advertising copywriter. ? Se inyecta drogas. ? Es [place] sexualmente con Ardelia Mems pareja que tiene VIH. Consulte a su mdico para saber si tiene un alto riesgo de infectarse por el VIH. Si opta por comenzar la profilaxis previa a la exposicin, primero debe realizarse anlisis de deteccin del VIH. Luego, le harn anlisis cada 81mses mientras est tomando los medicamentos para la profilaxis previa a la exposicin. [facility] Si es premenopusica y puede quedar eAquilla solicite a su mdico asesoramiento previo a la concepcin.  Si puede quedar  embarazada, tome 400 a [id number](mcg) de cido fAnheuser-Busch  Si desea evitar el embarazo, hable con su mdico sobre el control de la natalidad (anticoncepcin). OSTEOPOROSIS Y MENOPAUSIA  La osteoporosis es una enfermedad en la que los huesos pierden los minerales y la fuerza por el avance de la edad. El resultado pueden ser fracturas graves en los hBarahona El riesgo de osteoporosis puede identificarse con uArdelia Memsprueba de densidad sea.  Si tiene 65aos o ms, o si est en riesgo de sufrir osteoporosis y fracturas, pregunte a su mdico si debe someterse a exmenes.  Consulte a su mdico si debe tomar un suplemento de calcio o de vitamina D para reducir el riesgo de osteoporosis.  La menopausia puede presentar ciertos sntomas fsicos y rGaffer  La terapia de reemplazo hormonal puede reducir algunos de estos sntomas y rGaffer Consulte a su mdico para saber si la terapia de reemplazo hormonal es conveniente para usted. INSTRUCCIONES PARA EL CUIDADO EN EL HOGAR  Realcese los estudios de rutina de la salud, dentales y de lPublic librarian  MMarshall  No consuma ningn producto que contenga tabaco, lo que incluye cigarrillos, tabaco de mHigher education careers advisero cPsychologist, sport and exercise  Si est embarazada, no beba alcohol.  Si est amamantando, reduzca el consumo de alcohol y la frecuencia con la que consume.  Si es mujer y no est embarazada limite el consumo de alcohol a no ms de 1 medida por da. Una medida equivale a 12onzas de cerveza, 5onzas de vino o 1onzas de bebidas alcohlicas de alta graduacin.  No consuma drogas.  No comparta agujas.  Solicite ayuda a su mdico si necesita apoyo o informacin para abandonar las drogas.  Informe a su mdico si a menudo se siente deprimido.  Notifique a su mdico si alguna vez ha sido vctima de abuso o si no se siente seguro en su hogar. Esta informacin no tiene cMarine scientistel consejo del mdico.  Asegrese de hacerle al mdico cualquier pregunta que tenga. Document Released: 01/06/2011 Document Revised: 02/07/2014 Document Reviewed: 10/21/2014 Elsevier Interactive Patient Education  2[employer]

## 2016-10-19 NOTE — Assessment & Plan Note (Signed)
MRI showing possible compression of left L5 nerve root. Neurosurgery starting with treatment of cervical issues.

## 2016-10-19 NOTE — Assessment & Plan Note (Signed)
Preventative protocols reviewed and updated unless pt declined. Discussed healthy diet and lifestyle.  

## 2016-10-19 NOTE — Progress Notes (Signed)
BP 116/76 (BP Location: Left Arm, Patient Position: Sitting, Cuff Size: Normal)   Pulse 68   Temp 98 F (36.7 C) (Oral)   Ht 5\' 3"  (1.6 m)   Wt 186 lb 8 oz (84.6 kg)   SpO2 99%   BMI 33.04 kg/m    CC: CPE Subjective:    Patient ID: Melinda Hall, female    DOB: 1960-09-24, 56 y.o.   MRN: [id number]  HPI: Melinda Hall is a 56 y.o. female presenting on 10/19/2016 for Annual Exam   Upcoming ACDF surgery for L C4/5 cervical radiculopathy (Dr Annette Stable). Discussed this. Requests letter for work to avoid heavy lifting.   Preventative: Colonoscopy 11/2015 - TA rpt 5 yrs Allen Norris) Well woman with OBGYN. Requests referral today. S/p bilateral oophorectomy for dermoid cysts.  Mammo - 11/2015 - WNL DEXA 11/2015: T -2.6 spine osteoporosis, saw endo Dr Graceann Congress, rec against treatment at this time. rec rpt 12/2017.  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 Interim medical history since our last visit reviewed. Allergies and medications reviewed and updated. Outpatient Medications Prior to Visit  Medication Sig Dispense Refill  . calcium carbonate (OS-CAL) 600 MG TABS tablet Take 600 mg by mouth daily with breakfast.    . methocarbamol (ROBAXIN) 500 MG tablet Take 1 tablet (500 mg total) by mouth 3 (three) times daily as needed for muscle spasms. 30 tablet 1  . predniSONE (DELTASONE) 20 MG tablet Take two tablets daily for 3 days followed by one tablet daily for 4 days 10 tablet 0   No facility-administered medications prior to visit.      Per HPI unless specifically indicated in ROS section below Review of Systems  Constitutional: Negative for activity change, appetite change, chills,  fatigue, fever and unexpected weight change.  HENT: Negative for hearing loss.   Eyes: Negative for visual disturbance.  Respiratory: Negative for cough, chest tightness, shortness of breath and wheezing.   Cardiovascular: Negative for chest pain, palpitations and leg swelling.  Gastrointestinal: Negative for abdominal distention, abdominal pain, blood in stool, constipation, diarrhea, nausea and vomiting.  Genitourinary: Negative for difficulty urinating and hematuria.  Musculoskeletal: Negative for arthralgias, myalgias and neck pain.  Skin: Negative for rash.  Neurological: Negative for dizziness, seizures, syncope and headaches.  Hematological: Negative for adenopathy. Does not bruise/bleed easily.  Psychiatric/Behavioral: Negative for dysphoric mood. The patient is not nervous/anxious.        Objective:    BP 116/76 (BP Location: Left Arm, Patient Position: Sitting, Cuff Size: Normal)   Pulse 68   Temp 98 F (36.7 C) (Oral)   Ht 5\' 3"  (1.6 m)   Wt 186 lb 8 oz (84.6 kg)   SpO2 99%   BMI 33.04 kg/m   Wt Readings from Last 3 Encounters:  10/19/16 186 lb 8 oz (84.6 kg)  07/27/16 182 lb 12 oz (82.9 kg)  11/27/15 182 lb (82.6 kg)    Physical Exam  Constitutional: She is oriented to person, place, and time. She appears well-developed and well-nourished. No distress.  HENT:  Head: Normocephalic and atraumatic.  Right Ear: Hearing, tympanic membrane, external ear and ear canal normal.  Left Ear: Hearing, tympanic membrane, external ear and ear canal  normal.  Nose: Nose normal.  Mouth/Throat: Uvula is midline, oropharynx is clear and moist and mucous membranes are normal. No oropharyngeal exudate, posterior oropharyngeal edema or posterior oropharyngeal erythema.  Eyes: Pupils are equal, round, and reactive to light. Conjunctivae and EOM are normal. No scleral icterus.  Anisocoria - L side larger  Neck: Normal range of motion. Neck supple. No thyromegaly present.  Cardiovascular:  Normal rate, regular rhythm, normal heart sounds and intact distal pulses.   No murmur heard. Pulses:      Radial pulses are 2+ on the right side, and 2+ on the left side.  Pulmonary/Chest: Effort normal and breath sounds normal. No respiratory distress. She has no wheezes. She has no rales.  Prominent sternum  Abdominal: Soft. Bowel sounds are normal. She exhibits no distension and no mass. There is no tenderness. There is no rebound and no guarding.  Musculoskeletal: Normal range of motion. She exhibits no edema.  Lymphadenopathy:    She has no cervical adenopathy.  Neurological: She is alert and oriented to person, place, and time.  CN grossly intact, station and gait intact  Skin: Skin is warm and dry. No rash noted.  Psychiatric: She has a normal mood and affect. Her behavior is normal. Judgment and thought content normal.  Nursing note and vitals reviewed.  Results for orders placed or performed in visit on 12/02/15  HM MAMMOGRAPHY  Result Value Ref Range   HM Mammogram 0-4 Bi-Rad 0-4 Bi-Rad, Self Reported Normal      Assessment & Plan:   Problem List Items Addressed This Visit    Anisocoria    Chronic, stable.       Disorder of intervertebral disc at C4-C5 level with radiculopathy    Upcoming ACDF likely 01/2016.  Letter for work provided today - recommend avoid heavy lifting >20 lbs.       Healthcare maintenance - Primary    Preventative protocols reviewed and updated unless pt declined. Discussed healthy diet and lifestyle.       Iron deficiency    Update labs.       Left lumbar radiculopathy    MRI showing possible compression of left L5 nerve root. Neurosurgery starting with treatment of cervical issues.       Relevant Medications   methocarbamol (ROBAXIN) 500 MG tablet   Obesity, Class I, BMI 30-34.9    Encouraged healthy diet choices.       Osteoporosis    Mild osteoporosis. Saw endo, rec cal/vit D/weight bearing exercise, and rpt DEXA 12/2017. No  further treatment at this time.       Relevant Medications   Cholecalciferol (VITAMIN D3) 1000 units CAPS   Other Relevant Orders   VITAMIN D 25 Hydroxy (Vit-D Deficiency, Fractures)   Status post gastric bypass for obesity    Not regular with vitamins (other than vit D).  Update labs today.       Relevant Orders   VITAMIN D 25 Hydroxy (Vit-D Deficiency, Fractures)   Basic metabolic panel   CBC with Differential/Platelet   Ferritin   IBC panel   Vitamin B12   TSH    Other Visit Diagnoses    Lipid screening       Relevant Orders   Lipid panel   Well woman exam       Relevant Orders   Ambulatory referral to Gynecology       Follow up plan: Return in about 1 year (around 10/19/2017) for annual exam, prior fasting for blood  work.  [name], MD

## 2016-10-19 NOTE — Assessment & Plan Note (Signed)
Chronic, stable 

## 2016-10-19 NOTE — Assessment & Plan Note (Signed)
Not regular with vitamins (other than vit D).  Update labs today.

## 2016-10-19 NOTE — Assessment & Plan Note (Signed)
Mild osteoporosis. Saw endo, rec cal/vit D/weight bearing exercise, and rpt DEXA 12/2017. No further treatment at this time.

## 2016-10-19 NOTE — Assessment & Plan Note (Signed)
Update labs.  

## 2016-10-20 LAB — LIPID PANEL
Cholesterol: 153 mg/dL (ref 0–200)
HDL: 66.9 mg/dL (ref >39.00)
LDL Cholesterol: 57 mg/dL (ref 0–99)
NonHDL: 86.19
Total CHOL/HDL Ratio: 2
Triglycerides: 145 mg/dL (ref 0.0–149.0)
VLDL: 29 mg/dL (ref 0.0–40.0)

## 2016-10-20 LAB — CBC WITH DIFFERENTIAL/PLATELET
Basophils Absolute: 0.1 10*3/uL (ref 0.0–0.1)
Basophils Relative: 1.3 % (ref 0.0–3.0)
Eosinophils Absolute: 0.1 10*3/uL (ref 0.0–0.7)
Eosinophils Relative: 2.2 % (ref 0.0–5.0)
HCT: 33.2 % — ABNORMAL LOW (ref 36.0–46.0)
Hemoglobin: 10.6 g/dL — ABNORMAL LOW (ref 12.0–15.0)
Lymphocytes Relative: 31.1 % (ref 12.0–46.0)
Lymphs Abs: 2 10*3/uL (ref 0.7–4.0)
MCHC: 31.9 g/dL (ref 30.0–36.0)
MCV: 72 fl — ABNORMAL LOW (ref 78.0–100.0)
Monocytes Absolute: 0.7 10*3/uL (ref 0.1–1.0)
Monocytes Relative: 10.5 % (ref 3.0–12.0)
Neutro Abs: 3.6 10*3/uL (ref 1.4–7.7)
Neutrophils Relative %: 54.9 % (ref 43.0–77.0)
Platelets: 246 10*3/uL (ref 150.0–400.0)
RBC: 4.61 Mil/uL (ref 3.87–5.11)
RDW: 18 % — ABNORMAL HIGH (ref 11.5–15.5)
WBC: 6.5 10*3/uL (ref 4.0–10.5)

## 2016-10-20 LAB — BASIC METABOLIC PANEL
BUN: 19 mg/dL (ref 6–23)
CO2: 30 mEq/L (ref 19–32)
Calcium: 9.7 mg/dL (ref 8.4–10.5)
Chloride: 105 mEq/L (ref 96–112)
Creatinine, Ser: 0.76 mg/dL (ref 0.40–1.20)
GFR: 83.39 mL/min (ref >60.00)
Glucose, Bld: 89 mg/dL (ref 70–99)
Potassium: 4.6 mEq/L (ref 3.5–5.1)
Sodium: 140 mEq/L (ref 135–145)

## 2016-10-20 LAB — FERRITIN: Ferritin: 3 ng/mL — ABNORMAL LOW (ref 10.0–291.0)

## 2016-10-20 LAB — IBC PANEL
Iron: 13 ug/dL — ABNORMAL LOW (ref 42–145)
Saturation Ratios: 2.7 % — ABNORMAL LOW (ref 20.0–50.0)
Transferrin: 348 mg/dL (ref 212.0–360.0)

## 2016-10-20 LAB — VITAMIN D 25 HYDROXY (VIT D DEFICIENCY, FRACTURES): VITD: 30.87 ng/mL (ref 30.00–100.00)

## 2016-10-20 LAB — VITAMIN B12: Vitamin B-12: 330 pg/mL (ref 211–911)

## 2016-10-20 LAB — TSH: TSH: 3.43 u[IU]/mL (ref 0.35–4.50)

## 2016-10-20 NOTE — Addendum Note (Signed)
Addended by: Brenton Grills on: 5/39/1225 83:46 PM   Modules accepted: Orders

## 2016-10-22 ENCOUNTER — Other Ambulatory Visit: Payer: Self-pay | Admitting: Family Medicine

## 2016-10-22 MED ORDER — VITAMIN B-12 1000 MCG PO TABS: 1000.0000 ug | ORAL_TABLET | Freq: Every day | ORAL | Status: AC

## 2016-10-22 MED ORDER — FERROUS SULFATE 325 (65 FE) MG PO TABS: 325.0000 mg | ORAL_TABLET | Freq: Every day | ORAL | Status: DC

## 2016-11-02 ENCOUNTER — Telehealth: Payer: Self-pay | Admitting: Radiology

## 2016-11-02 NOTE — Telephone Encounter (Signed)
Left message on the cell phone voicemail to call [facility] to schedule Annual Exam (New Patient)

## 2016-12-01 HISTORY — PX: ANTERIOR CERVICAL DECOMP/DISCECTOMY FUSION: SHX1161

## 2016-12-08 DIAGNOSIS — M542 Cervicalgia: Secondary | ICD-10-CM | POA: Diagnosis not present

## 2016-12-26 DIAGNOSIS — Z01812 Encounter for preprocedural laboratory examination: Secondary | ICD-10-CM | POA: Diagnosis not present

## 2016-12-26 DIAGNOSIS — M50221 Other cervical disc displacement at C4-C5 level: Secondary | ICD-10-CM | POA: Diagnosis not present

## 2017-01-05 DIAGNOSIS — M4802 Spinal stenosis, cervical region: Secondary | ICD-10-CM | POA: Diagnosis not present

## 2017-01-05 DIAGNOSIS — M50121 Cervical disc disorder at C4-C5 level with radiculopathy: Secondary | ICD-10-CM | POA: Diagnosis not present

## 2017-01-05 DIAGNOSIS — M47812 Spondylosis without myelopathy or radiculopathy, cervical region: Secondary | ICD-10-CM | POA: Diagnosis not present

## 2017-01-05 DIAGNOSIS — M4722 Other spondylosis with radiculopathy, cervical region: Secondary | ICD-10-CM | POA: Diagnosis not present

## 2017-01-05 DIAGNOSIS — M50321 Other cervical disc degeneration at C4-C5 level: Secondary | ICD-10-CM | POA: Diagnosis not present

## 2017-01-22 ENCOUNTER — Encounter: Payer: Self-pay | Admitting: Family Medicine

## 2017-02-09 DIAGNOSIS — M50221 Other cervical disc displacement at C4-C5 level: Secondary | ICD-10-CM | POA: Diagnosis not present

## 2017-03-09 DIAGNOSIS — M50221 Other cervical disc displacement at C4-C5 level: Secondary | ICD-10-CM | POA: Diagnosis not present

## 2017-04-24 ENCOUNTER — Other Ambulatory Visit: Payer: Self-pay | Admitting: Family Medicine

## 2017-04-24 DIAGNOSIS — Z1231 Encounter for screening mammogram for malignant neoplasm of breast: Secondary | ICD-10-CM

## 2017-05-15 ENCOUNTER — Ambulatory Visit
Admission: RE | Admit: 2017-05-15 | Discharge: 2017-05-15 | Disposition: A | Discharge status: Home / Self Care | Payer: BLUE CROSS/BLUE SHIELD | Source: Ambulatory Visit | Attending: Family Medicine | Admitting: Family Medicine

## 2017-05-15 DIAGNOSIS — Z1231 Encounter for screening mammogram for malignant neoplasm of breast: Secondary | ICD-10-CM | POA: Insufficient documentation

## 2017-05-17 ENCOUNTER — Other Ambulatory Visit: Payer: Self-pay | Admitting: Family Medicine

## 2017-05-17 DIAGNOSIS — N6489 Other specified disorders of breast: Secondary | ICD-10-CM

## 2017-05-17 DIAGNOSIS — R928 Other abnormal and inconclusive findings on diagnostic imaging of breast: Secondary | ICD-10-CM

## 2017-05-31 ENCOUNTER — Ambulatory Visit
Admission: RE | Admit: 2017-05-31 | Discharge: 2017-05-31 | Disposition: A | Discharge status: Home / Self Care | Payer: BLUE CROSS/BLUE SHIELD | Source: Ambulatory Visit | Attending: Family Medicine | Admitting: Family Medicine

## 2017-05-31 DIAGNOSIS — N6489 Other specified disorders of breast: Secondary | ICD-10-CM | POA: Diagnosis not present

## 2017-05-31 DIAGNOSIS — N6321 Unspecified lump in the left breast, upper outer quadrant: Secondary | ICD-10-CM | POA: Diagnosis not present

## 2017-05-31 DIAGNOSIS — N6002 Solitary cyst of left breast: Secondary | ICD-10-CM | POA: Diagnosis not present

## 2017-05-31 DIAGNOSIS — R928 Other abnormal and inconclusive findings on diagnostic imaging of breast: Secondary | ICD-10-CM

## 2017-06-01 ENCOUNTER — Other Ambulatory Visit: Payer: Self-pay | Admitting: Family Medicine

## 2017-06-01 DIAGNOSIS — N6320 Unspecified lump in the left breast, unspecified quadrant: Secondary | ICD-10-CM

## 2017-06-01 DIAGNOSIS — R928 Other abnormal and inconclusive findings on diagnostic imaging of breast: Secondary | ICD-10-CM

## 2017-06-12 ENCOUNTER — Ambulatory Visit (INDEPENDENT_AMBULATORY_CARE_PROVIDER_SITE_OTHER): Payer: BLUE CROSS/BLUE SHIELD | Admitting: Family Medicine

## 2017-06-12 ENCOUNTER — Encounter: Payer: Self-pay | Admitting: Family Medicine

## 2017-06-12 VITALS — BP 116/79 | HR 60 | Wt 192.6 lb

## 2017-06-12 DIAGNOSIS — Z01419 Encounter for gynecological examination (general) (routine) without abnormal findings: Secondary | ICD-10-CM | POA: Diagnosis not present

## 2017-06-12 DIAGNOSIS — N951 Menopausal and female climacteric states: Secondary | ICD-10-CM

## 2017-06-12 DIAGNOSIS — Z1151 Encounter for screening for human papillomavirus (HPV): Secondary | ICD-10-CM

## 2017-06-12 DIAGNOSIS — Z124 Encounter for screening for malignant neoplasm of cervix: Secondary | ICD-10-CM

## 2017-06-12 MED ORDER — PRASTERONE 6.5 MG VA INST: 1.0000 | VAGINAL_INSERT | Freq: Every day | VAGINAL | 2 refills | Status: DC

## 2017-06-12 NOTE — Progress Notes (Signed)
Biopsy of breast 06/13/2017 Last pap 3 years ago

## 2017-06-12 NOTE — Patient Instructions (Signed)
Preventive Care 40-64 Years, Female Preventive care refers to lifestyle choices and visits with your health care provider that can promote health and wellness. What does preventive care include?  A yearly physical exam. This is also called an annual well check.  Dental exams once or twice a year.  Routine eye exams. Ask your health care provider how often you should have your eyes checked.  Personal lifestyle choices, including: ? Daily care of your teeth and gums. ? Regular physical activity. ? Eating a healthy diet. ? Avoiding tobacco and drug use. ? Limiting alcohol use. ? Practicing safe sex. ? Taking low-dose aspirin daily starting at age 58. ? Taking vitamin and mineral supplements as recommended by your health care provider. What happens during an annual well check? The services and screenings done by your health care provider during your annual well check will depend on your age, overall health, lifestyle risk factors, and family history of disease. Counseling Your health care provider may ask you questions about your:  Alcohol use.  Tobacco use.  Drug use.  Emotional well-being.  Home and relationship well-being.  Sexual activity.  Eating habits.  Work and work Statistician.  Method of birth control.  Menstrual cycle.  Pregnancy history.  Screening You may have the following tests or measurements:  Height, weight, and BMI.  Blood pressure.  Lipid and cholesterol levels. These may be checked every 5 years, or more frequently if you are over 81 years old.  Skin check.  Lung cancer screening. You may have this screening every year starting at age 78 if you have a 30-pack-year history of smoking and currently smoke or have quit within the past 15 years.  Fecal occult blood test (FOBT) of the stool. You may have this test every year starting at age 65.  Flexible sigmoidoscopy or colonoscopy. You may have a sigmoidoscopy every 5 years or a colonoscopy  every 10 years starting at age 30.  Hepatitis C blood test.  Hepatitis B blood test.  Sexually transmitted disease (STD) testing.  Diabetes screening. This is done by checking your blood sugar (glucose) after you have not eaten for a while (fasting). You may have this done every 1-3 years.  Mammogram. This may be done every 1-2 years. Talk to your health care provider about when you should start having regular mammograms. This may depend on whether you have a family history of breast cancer.  BRCA-related cancer screening. This may be done if you have a family history of breast, ovarian, tubal, or peritoneal cancers.  Pelvic exam and Pap test. This may be done every 3 years starting at age 80. Starting at age 36, this may be done every 5 years if you have a Pap test in combination with an HPV test.  Bone density scan. This is done to screen for osteoporosis. You may have this scan if you are at high risk for osteoporosis.  Discuss your test results, treatment options, and if necessary, the need for more tests with your health care provider. Vaccines Your health care provider may recommend certain vaccines, such as:  Influenza vaccine. This is recommended every year.  Tetanus, diphtheria, and acellular pertussis (Tdap, Td) vaccine. You may need a Td booster every 10 years.  Varicella vaccine. You may need this if you have not been vaccinated.  Zoster vaccine. You may need this after age 5.  Measles, mumps, and rubella (MMR) vaccine. You may need at least one dose of MMR if you were born in  1957 or later. You may also need a second dose.  Pneumococcal 13-valent conjugate (PCV13) vaccine. You may need this if you have certain conditions and were not previously vaccinated.  Pneumococcal polysaccharide (PPSV23) vaccine. You may need one or two doses if you smoke cigarettes or if you have certain conditions.  Meningococcal vaccine. You may need this if you have certain  conditions.  Hepatitis A vaccine. You may need this if you have certain conditions or if you travel or work in places where you may be exposed to hepatitis A.  Hepatitis B vaccine. You may need this if you have certain conditions or if you travel or work in places where you may be exposed to hepatitis B.  Haemophilus influenzae type b (Hib) vaccine. You may need this if you have certain conditions.  Talk to your health care provider about which screenings and vaccines you need and how often you need them. This information is not intended to replace advice given to you by your health care provider. Make sure you discuss any questions you have with your health care provider. Document Released: 02/13/2015 Document Revised: 10/07/2015 Document Reviewed: 11/18/2014 Elsevier Interactive Patient Education  2018 Elsevier Inc.  

## 2017-06-12 NOTE — Progress Notes (Signed)
Subjective:     Melinda Hall is a 57 y.o. female and is here for a comprehensive physical exam. The patient reports no problemsReports some vaginal dryness. Remains sexually active with her husband who is a Administrator. She works in a [employer]. Ovaries surgically removed due to cysts, dermoids. Became menopausal in her 58s. Still has uterus and cervix.  Social History   Socioeconomic History  . Marital status: Married    Spouse name: Not on file  . Number of children: Not on file  . Years of education: Not on file  . Highest education level: Not on file  Occupational History  . Not on file  Social Needs  . Financial resource strain: Not on file  . Food insecurity:    Worry: Not on file    Inability: Not on file  . Transportation needs:    Medical: Not on file    Non-medical: Not on file  Tobacco Use  . Smoking status: Former Smoker    Packs/day: 0.25    Years: 20.00    Pack years: 5.00    Types: Cigarettes    Last attempt to quit: 10/31/2012    Years since quitting: 4.6  . Smokeless tobacco: Never Used  Substance and Sexual Activity  . Alcohol use: Yes    Comment: 1-2 drinks per month  . Drug use: No  . Sexual activity: Not on file  Lifestyle  . Physical activity:    Days per week: Not on file    Minutes per session: Not on file  . Stress: Not on file  Relationships  . Social connections:    Talks on phone: Not on file    Gets together: Not on file    Attends religious service: Not on file    Active member of club or organization: Not on file    Attends meetings of clubs or organizations: Not on file    Relationship status: Not on file  . Intimate partner violence:    Fear of current or ex partner: Not on file    Emotionally abused: Not on file    Physically abused: Not on file    Forced sexual activity: Not on file  Other Topics Concern  . 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 Hepatitis C Screening  03-10-1960  . HIV Screening  11/19/1974  . PAP SMEAR  11/18/1980  . INFLUENZA VACCINE  08/31/2017  . MAMMOGRAM  05/16/2018  . COLONOSCOPY  11/26/2020  . DTaP/Tdap/Td (2 - Td) 10/20/2026  . TETANUS/TDAP  10/20/2026    The following portions of the patient's history were reviewed and updated as appropriate: allergies, current medications, past family history, past medical history, past social history, past surgical history and problem list.  Review of Systems Pertinent items noted in HPI and remainder of comprehensive ROS otherwise negative.   Objective:    BP 116/79   Pulse 60   Wt 192 lb 9.6 oz (87.4 kg)   BMI 34.12 kg/m  General appearance: alert, cooperative and appears stated age Head: Normocephalic, without obvious abnormality, atraumatic Neck: no adenopathy, supple, symmetrical, trachea midline and thyroid not enlarged, symmetric, no tenderness/mass/nodules Lungs: clear to  auscultation bilaterally Breasts: normal appearance, no masses or tenderness Heart: regular rate and rhythm, S1, S2 normal, no murmur, click, rub or gallop Abdomen: soft, non-tender; bowel sounds normal; no masses,  no organomegaly Pelvic: cervix normal in appearance, external genitalia normal, no adnexal masses or tenderness, no cervical motion tenderness, uterus normal size, shape, and consistency and vagina normal without discharge Extremities: extremities normal, atraumatic, no cyanosis or edema Pulses: 2+ and symmetric Skin: Skin color, texture, turgor normal. No rashes or lesions Lymph nodes: Cervical, supraclavicular, and axillary nodes normal. Neurologic: Grossly normal    Assessment:    Healthy female exam.      Plan:     See After Visit Summary for  Counseling Recommendations

## 2017-06-13 ENCOUNTER — Ambulatory Visit
Admission: RE | Admit: 2017-06-13 | Discharge: 2017-06-13 | Disposition: A | Discharge status: Home / Self Care | Payer: BLUE CROSS/BLUE SHIELD | Source: Ambulatory Visit | Attending: Family Medicine | Admitting: Family Medicine

## 2017-06-13 DIAGNOSIS — D242 Benign neoplasm of left breast: Secondary | ICD-10-CM | POA: Diagnosis not present

## 2017-06-13 DIAGNOSIS — R928 Other abnormal and inconclusive findings on diagnostic imaging of breast: Secondary | ICD-10-CM

## 2017-06-13 DIAGNOSIS — N6321 Unspecified lump in the left breast, upper outer quadrant: Secondary | ICD-10-CM | POA: Diagnosis not present

## 2017-06-13 DIAGNOSIS — N6320 Unspecified lump in the left breast, unspecified quadrant: Secondary | ICD-10-CM

## 2017-06-13 DIAGNOSIS — N6489 Other specified disorders of breast: Secondary | ICD-10-CM | POA: Insufficient documentation

## 2017-06-13 DIAGNOSIS — N951 Menopausal and female climacteric states: Secondary | ICD-10-CM | POA: Insufficient documentation

## 2017-06-13 HISTORY — PX: BREAST BIOPSY: SHX20

## 2017-06-13 NOTE — Assessment & Plan Note (Signed)
Trial of [place], non hormonal and excellent safety profile. Samples given. Use water based lubricants.

## 2017-06-14 LAB — SURGICAL PATHOLOGY

## 2017-06-15 LAB — CYTOLOGY - PAP
Diagnosis: NEGATIVE
HPV: NOT DETECTED

## 2017-06-18 ENCOUNTER — Encounter: Payer: Self-pay | Admitting: Family Medicine

## 2017-06-18 DIAGNOSIS — D242 Benign neoplasm of left breast: Secondary | ICD-10-CM

## 2017-06-18 HISTORY — DX: Benign neoplasm of left breast: D24.2

## 2017-06-20 ENCOUNTER — Telehealth: Payer: Self-pay | Admitting: Family Medicine

## 2017-06-20 DIAGNOSIS — D242 Benign neoplasm of left breast: Secondary | ICD-10-CM

## 2017-06-20 NOTE — Telephone Encounter (Signed)
Noted  Referral placed.

## 2017-06-20 NOTE — Telephone Encounter (Signed)
Called patient to let her know you were aware of her Appt with Dr Dahlia Byes with [facility]

## 2017-06-20 NOTE — Telephone Encounter (Signed)
[facility] radiology Florinda Marker) called to let you know that pt has been referred to [facility] on 07/05/17 @ 3:30 for interductal papillom left breast [place] [facility] wants a referral for this appointment  Results have been discussed with pt and her husband.

## 2017-07-01 ENCOUNTER — Ambulatory Visit (INDEPENDENT_AMBULATORY_CARE_PROVIDER_SITE_OTHER): Payer: BLUE CROSS/BLUE SHIELD | Admitting: Family Medicine

## 2017-07-01 ENCOUNTER — Encounter: Payer: Self-pay | Admitting: Family Medicine

## 2017-07-01 VITALS — BP 110/78 | HR 69 | Temp 98.1°F | Ht 63.0 in | Wt 189.2 lb

## 2017-07-01 DIAGNOSIS — R300 Dysuria: Secondary | ICD-10-CM | POA: Diagnosis not present

## 2017-07-01 DIAGNOSIS — N3001 Acute cystitis with hematuria: Secondary | ICD-10-CM

## 2017-07-01 LAB — POCT URINALYSIS DIPSTICK
Bilirubin, UA: NEGATIVE
Blood, UA: 3
Glucose, UA: NEGATIVE
Ketones, UA: NEGATIVE
Nitrite, UA: NEGATIVE
Protein, UA: POSITIVE — AB
Spec Grav, UA: 1.015 (ref 1.010–1.025)
Urobilinogen, UA: 0.2 E.U./dL
pH, UA: 6 (ref 5.0–8.0)

## 2017-07-01 MED ORDER — CIPROFLOXACIN HCL 500 MG PO TABS: 500.0000 mg | ORAL_TABLET | Freq: Two times a day (BID) | ORAL | 0 refills | Status: DC

## 2017-07-01 NOTE — Progress Notes (Signed)
   Subjective:    Patient ID: Melinda Hall, female    DOB: [birth year], 57 y.o.   MRN: [id number]  HPI Here for 2 days of urinary burning and frequency. She has seen some blood in the urine. No back pain or fever.    Review of Systems  Constitutional: Negative.   Respiratory: Negative.   Cardiovascular: Negative.   Gastrointestinal: Negative.   Genitourinary: Positive for dysuria, flank pain, hematuria and urgency.       Objective:   Physical Exam  Constitutional: She appears well-developed and well-nourished.  Cardiovascular: Normal rate, regular rhythm, normal heart sounds and intact distal pulses.  Pulmonary/Chest: Effort normal and breath sounds normal.  Abdominal: Soft. Bowel sounds are normal. She exhibits no distension and no mass. There is no tenderness. There is no rebound and no guarding. No hernia.          Assessment & Plan:  UTI, treat with Cipro. Drink fluids and use Azo prn.  Alysia Penna, MD

## 2017-07-05 ENCOUNTER — Encounter: Payer: Self-pay | Admitting: Surgery

## 2017-07-05 ENCOUNTER — Ambulatory Visit (INDEPENDENT_AMBULATORY_CARE_PROVIDER_SITE_OTHER): Payer: BLUE CROSS/BLUE SHIELD | Admitting: Surgery

## 2017-07-05 VITALS — BP 136/89 | HR 62 | Temp 97.9°F | Ht 63.0 in | Wt 191.4 lb

## 2017-07-05 DIAGNOSIS — D242 Benign neoplasm of left breast: Secondary | ICD-10-CM | POA: Diagnosis not present

## 2017-07-05 NOTE — Patient Instructions (Addendum)
We have schedule your surgery for the week late June or July at [facility] with Dr.Pabon. We will call you with an exact date.   Please see your blue pre-care sheet for surgery information.  Please call if you have any questions or concerns.  Breast Self-Awareness Breast self-awareness means:  Knowing how your breasts look.  Knowing how your breasts feel.  Checking your breasts every month for changes.  Telling your doctor if you notice a change in your breasts.  Breast self-awareness allows you to notice a breast problem early while it is still small. How to do a breast self-exam One way to learn what is normal for your breasts and to check for changes is to do a breast self-exam. To do a breast self-exam: Look for Changes  1. Take off all the clothes above your waist. 2. Stand in front of a mirror in a room with good lighting. 3. Put your hands on your hips. 4. Push your hands down. 5. Look at your breasts and nipples in the mirror to see if one breast or nipple looks different than the other. Check to see if: ? The shape of one breast is different. ? The size of one breast is different. ? There are wrinkles, dips, and bumps in one breast and not the other. 6. Look at each breast for changes in your skin, such as: ? Redness. ? Scaly areas. 7. Look for changes in your nipples, such as: ? Liquid around the nipples. ? Bleeding. ? Dimpling. ? Redness. ? A change in where the nipples are. Feel for Changes 1. Lie on your back on the floor. 2. Feel each breast. To do this, follow these steps: ? Pick a breast to feel. ? Put the arm closest to that breast above your head. ? Use your other arm to feel the nipple area of your breast. Feel the area with the pads of your three middle fingers by making small circles with your fingers. For the first circle, press lightly. For the second circle, press harder. For the third circle, press even harder. ? Keep making  circles with your fingers at the light, harder, and even harder pressures as you move down your breast. Stop when you feel your ribs. ? Move your fingers a little toward the center of your body. ? Start making circles with your fingers again, this time going up until you reach your collarbone. ? Keep making up and down circles until you reach your armpit. Remember to keep using the three pressures. ? Feel the other breast in the same way. 3. Sit or stand in the shower or tub. 4. With soapy water on your skin, feel each breast the same way you did in step 2, when you were lying on the floor. Write Down What You Find  After doing the self-exam, write down:  What is normal for each breast.  Any changes you find in each breast.  When you last had your period.  How often should I check my breasts? Check your breasts every month. If you are breastfeeding, the best time to check them is after you feed your baby or after you use a breast pump. If you get periods, the best time to check your breasts is 5-7 days after your period is over. When should I see my doctor? See your doctor if you notice:  A change in shape or size of your breasts or nipples.  A change in the skin of  your breast or nipples, such as red or scaly skin.  Unusual fluid coming from your nipples.  A lump or thick area that was not there before.  Pain in your breasts.  Anything that concerns you.  This information is not intended to replace advice given to you by your health care provider. Make sure you discuss any questions you have with your health care provider. Document Released: 07/06/2007 Document Revised: 06/25/2015 Document Reviewed: 12/07/2014 Elsevier Interactive Patient Education  [employer].

## 2017-07-06 ENCOUNTER — Encounter: Payer: Self-pay | Admitting: Surgery

## 2017-07-06 NOTE — Progress Notes (Signed)
Patient ID: Melinda Hall, female   DOB: Jun 18, 1960, 57 y.o.   MRN: [id number]  HPI Melinda Hall is a 57 y.o. female seen in consultation at the request of Dr. Rosana Hoes.  He had a recent screening mammogram  ( personally reviewed) showing some asymmetry in her left breast and underwent ultrasound of the left breast showing a lesion located at 2:00 on her left breast 1 cm from the nipple.  Biopsy performed, pathology results reviewed with the patient showing evidence of intraductal papilloma. She denies any breast changes.  No pain, no masses, no discharge. She did have a previous history of bilateral reduction mastoplasty several years ago as well as a gastric bypass. No family history of breast cancer.  No previous biopsies on her breast. Able to perform more than 4 METS of activity without any shortness of breath or chest pain. She is currently taking Cipro for UTI.  HPI  Past Medical History:  Diagnosis Date  . Dermoid tumor    hx of--left ovary  . Gastric bypass status for obesity 2004   s/p roux-en-y  . Generalized headaches    frequent  . Heart murmur   . History of cardiac murmur   . Osteoporosis 03/2016   DEXA -2.6 spine  . Salivary gland stone    x 2  . Smoker     Past Surgical History:  Procedure Laterality Date  . ABDOMINAL HYSTERECTOMY    . ANTERIOR CERVICAL DECOMP/DISCECTOMY FUSION  12/2016   Pool  . APPENDECTOMY  1980  . BILATERAL OOPHORECTOMY  ~1992   for dermoid ovarian cysts  . CHOLECYSTECTOMY  2000s  . COLONOSCOPY WITH PROPOFOL N/A 11/27/2015   TA rpt 5 yrs Lucilla Lame, MD)  . GASTRIC BYPASS  2004   roux en y [facility])  . POLYPECTOMY  11/27/2015   Procedure: POLYPECTOMY;  Surgeon: Lucilla Lame, MD;  Location: [place];  Service: Endoscopy;;  . REDUCTION MAMMAPLASTY Bilateral 1990's    Family History  Problem Relation Age of Onset  . Cancer Father 52       leukemia  . Hypertension Mother   . Diabetes Neg Hx   . Stroke Neg Hx   . Coronary  artery disease Neg Hx   . Breast cancer Neg Hx     Social History Social History   Tobacco Use  . Smoking status: Former Smoker    Packs/day: 0.25    Years: 20.00    Pack years: 5.00    Types: Cigarettes    Last attempt to quit: 10/31/2012    Years since quitting: 4.6  . Smokeless tobacco: Never Used  Substance Use Topics  . Alcohol use: Yes    Comment: 1-2 drinks per month  . Drug use: No    No Known Allergies  Current Outpatient Medications  Medication Sig Dispense Refill  . calcium carbonate (OS-CAL) 600 MG TABS tablet Take 600 mg by mouth daily with breakfast.    . Cholecalciferol (VITAMIN D3) 1000 units CAPS Take 3 capsules (3,000 Units total) by mouth daily.    . ciprofloxacin (CIPRO) 500 MG tablet Take 1 tablet (500 mg total) by mouth 2 (two) times daily. 14 tablet 0  . ferrous sulfate 325 (65 FE) MG tablet Take 1 tablet (325 mg total) by mouth daily with breakfast.    . Prasterone (INTRAROSA) 6.5 MG INST Place 1 suppository vaginally daily. 30 each 2  . vitamin B-12 (CYANOCOBALAMIN) 1000 MCG tablet Take 1 tablet (1,000 mcg total) by mouth daily.  No current facility-administered medications for this visit.      Review of Systems Full ROS  was asked and was negative except for the information on the HPI  Physical Exam Blood pressure 136/89, pulse 62, temperature 97.9 F (36.6 C), temperature source Oral, height 5\' 3"  (1.6 m), weight 86.8 kg (191 lb 6.4 oz). CONSTITUTIONAL: NAD EYES: Pupils are equal, round, and reactive to light, Sclera are non-icteric. EARS, NOSE, MOUTH AND THROAT: The oropharynx is clear. The oral mucosa is pink and moist. Hearing is intact to voice. LYMPH NODES:  Lymph nodes in the neck are normal. RESPIRATORY:  Lungs are clear. There is normal respiratory effort, with equal breath sounds bilaterally, and without pathologic use of accessory muscles. CARDIOVASCULAR: Heart is regular without murmurs, gallops, or rubs. BREAST; previous biopsy  site, previus mammoplasty scars. No masses, No skin changes. Nipple is normal. No D/C. Axilla free of disease GI: The abdomen is  soft, nontender, and nondistended. There are no palpable masses. There is no hepatosplenomegaly. There are normal bowel sounds in all quadrants. GU: Rectal deferred.   MUSCULOSKELETAL: Normal muscle strength and tone. No cyanosis or edema.   SKIN: Turgor is good and there are no pathologic skin lesions or ulcers. NEUROLOGIC: Motor and sensation is grossly normal. Cranial nerves are grossly intact. PSYCH:  Oriented to person, place and time. Affect is normal.  Data Reviewed  I have personally reviewed the patient's imaging, laboratory findings and medical records.    Assessment/Plan 57 year old female with a newly diagnosed intraductal papilloma without evidence of DCIS malignancy or atypia. I Had a lengthy discussion with the patient regarding this high risk benign lesion.  I discussed with her my recommendation for needle guided excisional biopsy to exclude any potential areas of DCIS. I have discussed with the patient in detail about the procedure, risk, benefits and possible complications including but not limited to: Bleeding, infection, seroma, pain, need for re-intervention, the chance of having this lesion upgraded to DCIS or even invasive carcinoma.  She understands and wishes to proceed.  Extensive counseling provided A copy of this report will be sent to the referring provider Plan for  needle guided lumpectomy of Left breast  Caroleen Hamman, MD FACS General Surgeon 07/06/2017, 4:40 PM

## 2017-07-10 ENCOUNTER — Telehealth: Payer: Self-pay | Admitting: Surgery

## 2017-07-10 MED ORDER — CHLORHEXIDINE GLUCONATE CLOTH 2 % EX PADS: 6.0000 | MEDICATED_PAD | Freq: Once | CUTANEOUS | Status: DC

## 2017-07-10 MED ORDER — ACETAMINOPHEN 500 MG PO TABS: 1000.0000 mg | ORAL_TABLET | ORAL | Status: AC

## 2017-07-10 MED ORDER — BUPIVACAINE LIPOSOME 1.3 % IJ SUSP: 20.0000 mL | Freq: Once | INTRAMUSCULAR | Status: DC

## 2017-07-10 MED ORDER — GABAPENTIN 100 MG PO CAPS: 300.0000 mg | ORAL_CAPSULE | ORAL | Status: AC

## 2017-07-10 MED ORDER — CELECOXIB 100 MG PO CAPS: 200.0000 mg | ORAL_CAPSULE | ORAL | Status: AC

## 2017-07-10 MED ORDER — DEXTROSE 5 % IV SOLN: 2.0000 g | INTRAVENOUS | Status: AC

## 2017-07-10 NOTE — Telephone Encounter (Addendum)
Pt advised of pre op date/time and sx date. Sx: 08/10/17 with Dr Viann Shove breast lumpectomy with NL.  Pre op: 08/04/17 @ 12:15pm--office interview.   Patient made aware to arrive at [facility] at 7:45am. Patient is aware.

## 2017-07-10 NOTE — Addendum Note (Signed)
Addended by: Caroleen Hamman F on: 07/10/2017 06:57 PM   Modules accepted: Orders

## 2017-07-11 ENCOUNTER — Other Ambulatory Visit: Payer: Self-pay | Admitting: *Deleted

## 2017-07-11 ENCOUNTER — Other Ambulatory Visit: Payer: Self-pay | Admitting: Surgery

## 2017-07-11 DIAGNOSIS — D242 Benign neoplasm of left breast: Secondary | ICD-10-CM

## 2017-07-17 NOTE — Addendum Note (Signed)
Addended by: Caroleen Hamman F on: 07/17/2017 08:45 AM   Modules accepted: Miquel Dunn

## 2017-07-19 ENCOUNTER — Encounter: Payer: Self-pay | Admitting: Family Medicine

## 2017-07-19 DIAGNOSIS — Z283 Underimmunization status: Secondary | ICD-10-CM

## 2017-07-19 DIAGNOSIS — Z9189 Other specified personal risk factors, not elsewhere classified: Principal | ICD-10-CM

## 2017-07-19 DIAGNOSIS — Z2839 Other underimmunization status: Secondary | ICD-10-CM

## 2017-07-20 ENCOUNTER — Other Ambulatory Visit (INDEPENDENT_AMBULATORY_CARE_PROVIDER_SITE_OTHER): Payer: BLUE CROSS/BLUE SHIELD

## 2017-07-20 DIAGNOSIS — Z9189 Other specified personal risk factors, not elsewhere classified: Secondary | ICD-10-CM

## 2017-07-20 DIAGNOSIS — Z283 Underimmunization status: Secondary | ICD-10-CM

## 2017-07-20 DIAGNOSIS — Z2839 Other underimmunization status: Secondary | ICD-10-CM

## 2017-07-20 NOTE — Telephone Encounter (Signed)
plz call to schedule lab visit only for measles titer

## 2017-07-21 LAB — RUBEOLA ANTIBODY IGG: Rubeola IgG: >300 AU/mL

## 2017-08-04 ENCOUNTER — Encounter
Admission: RE | Admit: 2017-08-04 | Discharge: 2017-08-04 | Disposition: A | Discharge status: Home / Self Care | Payer: BLUE CROSS/BLUE SHIELD | Source: Ambulatory Visit | Attending: Surgery | Admitting: Surgery

## 2017-08-04 ENCOUNTER — Other Ambulatory Visit: Payer: Self-pay

## 2017-08-04 ENCOUNTER — Inpatient Hospital Stay: Admission: RE | Admit: 2017-08-04 | Payer: BLUE CROSS/BLUE SHIELD | Source: Ambulatory Visit

## 2017-08-04 DIAGNOSIS — Z01818 Encounter for other preprocedural examination: Secondary | ICD-10-CM | POA: Diagnosis not present

## 2017-08-04 NOTE — Patient Instructions (Signed)
Your procedure is scheduled on: Thursday 08/10/17 Su procedimiento est programado para: Report to  Presntese a: To find out your arrival time please call [phone] between 1PM - 3PM on Wednesday 08/09/17. Para saber su hora de llegada por favor llame al [phone] entre la 1PM - 3PM el da:  Remember: Instructions that are not followed completely may result in serious medical risk, up to and including death, or upon the discretion of your surgeon and anesthesiologist your surgery may need to be rescheduled.  Recuerde: Las instrucciones que no se siguen completamente Heritage manager en un riesgo de salud grave, incluyendo hasta la [place] o a discrecin de su cirujano y Environmental health practitioner, su ciruga se puede posponer.   __X__ 1. Do not eat food or drink liquids after midnight. No gum chewing or hard candies.  No coma alimentos ni tome lquidos despus de la medianoche.  No mastique chicle ni caramelos  duros.     __X__ 2. No alcohol for 24 hours before or after surgery.    No tome alcohol durante las 24 horas antes ni despus de la [place].   ____ 3. Bring all medications with you on the day of surgery if instructed.    Lleve todos los medicamentos con usted el da de su ciruga si se le ha indicado as.   __X__ 4. Notify your doctor if there is any change in your medical condition (cold, fever,                             infections).    Informe a su mdico si hay algn cambio en su condicin mdica (resfriado, fiebre, infecciones).   Do not wear jewelry, make-up, hairpins, clips or nail polish.  No use joyas, maquillajes, pinzas/ganchos para el cabello ni esmalte de uas.  Do not wear lotions, powders, or perfumes. .  No use lociones, polvos o perfumes.  .    Do not shave 48 hours prior to surgery. Men may shave face and neck.  No se afeite 48 horas antes de la [place].  Los hombres pueden [employer] cara y el cuello.   Do not bring valuables to the hospital.   No lleve objetos  [place] is not responsible for any belongings or valuables.  [place] no se hace responsable de ningn tipo de pertenencias u objetos de Geographical information systems officer.               Contacts, dentures or bridgework may not be worn into surgery.  Los lentes de [place], las dentaduras postizas o puentes no se pueden usar en la [place].  Leave your suitcase in the car. After surgery it may be brought to your room.  Deje su maleta en el auto.  Despus de la ciruga podr traerla a su habitacin.  For patients admitted to the hospital, discharge time is determined by your treatment team.  Para los pacientes que sean ingresados al hospital, el tiempo en el cual se le dar de alta es determinado por su                equipo de [place].   Patients discharged the day of surgery will not be allowed to drive home. A los pacientes que se les da de alta el mismo da de la ciruga no se les permitir conducir a Holiday representative.   Please read over the following fact sheets that you were given: Por favor lea  las siguientes hojas de informacin que le dieron:   MRSA Information   ____ Take these medicines the morning of surgery with A SIP OF WATER:          Tome estas medicinas la maana de la ciruga con [place]:  1. none  2.   3.   4.       5.  6.  ____ Fleet Enema (as directed)          Enema de [place] (segn lo indicado)    __X__ Use CHG Soap as directed          Utilice el jabn de CHG segn lo indicado  ____ Use inhalers on the day of surgery          Use los inhaladores el da de la ciruga  ____ Stop metformin 2 days prior to surgery          Deje de tomar el metformin 2 das antes de la ciruga    ____ Take 1/2 of usual insulin dose the night before surgery and none on the morning of surgery           Tome la mitad de la dosis habitual de insulina la noche antes de la [place] y no tome nada en la maana de la             ciruga  ____ Stop Coumadin/Plavix/aspirin on            Deje de tomar el Coumadin/Plavix/aspirina el da:  ____ Stop Anti-inflammatories on           Deje de tomar antiinflamatorios el da:   ____ Stop supplements until after surgery            Deje de tomar suplementos hasta despus de la ciruga  ____ Bring C-Pap to the hospital          [place] al hospital

## 2017-08-09 NOTE — Addendum Note (Signed)
Addended by: Caroleen Hamman F on: 08/09/2017 01:17 PM   Modules accepted: Orders, SmartSet

## 2017-08-10 ENCOUNTER — Ambulatory Visit
Admission: RE | Admit: 2017-08-10 | Discharge: 2017-08-10 | Disposition: A | Discharge status: Home / Self Care | Payer: BLUE CROSS/BLUE SHIELD | Source: Ambulatory Visit | Attending: Surgery | Admitting: Surgery

## 2017-08-10 ENCOUNTER — Other Ambulatory Visit: Payer: Self-pay | Admitting: Surgery

## 2017-08-10 ENCOUNTER — Ambulatory Visit: Payer: BLUE CROSS/BLUE SHIELD

## 2017-08-10 ENCOUNTER — Encounter
Admission: RE | Disposition: A | Discharge status: Home / Self Care | Payer: Self-pay | Source: Ambulatory Visit | Attending: Surgery

## 2017-08-10 ENCOUNTER — Other Ambulatory Visit: Payer: Self-pay

## 2017-08-10 ENCOUNTER — Encounter: Payer: Self-pay | Admitting: *Deleted

## 2017-08-10 DIAGNOSIS — Z87891 Personal history of nicotine dependence: Secondary | ICD-10-CM | POA: Diagnosis not present

## 2017-08-10 DIAGNOSIS — Z79890 Hormone replacement therapy (postmenopausal): Secondary | ICD-10-CM | POA: Insufficient documentation

## 2017-08-10 DIAGNOSIS — N6321 Unspecified lump in the left breast, upper outer quadrant: Secondary | ICD-10-CM | POA: Diagnosis not present

## 2017-08-10 DIAGNOSIS — Z9884 Bariatric surgery status: Secondary | ICD-10-CM | POA: Insufficient documentation

## 2017-08-10 DIAGNOSIS — D242 Benign neoplasm of left breast: Secondary | ICD-10-CM

## 2017-08-10 DIAGNOSIS — Z981 Arthrodesis status: Secondary | ICD-10-CM | POA: Diagnosis not present

## 2017-08-10 DIAGNOSIS — Z6833 Body mass index (BMI) 33.0-33.9, adult: Secondary | ICD-10-CM | POA: Insufficient documentation

## 2017-08-10 DIAGNOSIS — N6320 Unspecified lump in the left breast, unspecified quadrant: Secondary | ICD-10-CM | POA: Diagnosis not present

## 2017-08-10 DIAGNOSIS — E669 Obesity, unspecified: Secondary | ICD-10-CM | POA: Insufficient documentation

## 2017-08-10 DIAGNOSIS — Z79899 Other long term (current) drug therapy: Secondary | ICD-10-CM | POA: Diagnosis not present

## 2017-08-10 DIAGNOSIS — Z90722 Acquired absence of ovaries, bilateral: Secondary | ICD-10-CM | POA: Diagnosis not present

## 2017-08-10 DIAGNOSIS — M810 Age-related osteoporosis without current pathological fracture: Secondary | ICD-10-CM | POA: Diagnosis not present

## 2017-08-10 DIAGNOSIS — Z90710 Acquired absence of both cervix and uterus: Secondary | ICD-10-CM | POA: Diagnosis not present

## 2017-08-10 DIAGNOSIS — Z9049 Acquired absence of other specified parts of digestive tract: Secondary | ICD-10-CM | POA: Diagnosis not present

## 2017-08-10 HISTORY — PX: BREAST LUMPECTOMY WITH NEEDLE LOCALIZATION: SHX5759

## 2017-08-10 SURGERY — BREAST LUMPECTOMY WITH NEEDLE LOCALIZATION
Anesthesia: General | Laterality: Left | Wound class: Clean

## 2017-08-10 MED ORDER — CEFAZOLIN SODIUM-DEXTROSE 2-4 GM/100ML-% IV SOLN
INTRAVENOUS | Status: AC
  Filled 2017-08-10: qty 100

## 2017-08-10 MED ORDER — CHLORHEXIDINE GLUCONATE CLOTH 2 % EX PADS: 6.0000 | MEDICATED_PAD | Freq: Once | CUTANEOUS | Status: DC

## 2017-08-10 MED ORDER — LACTATED RINGERS IV SOLN
INTRAVENOUS | Status: DC
  Administered 2017-08-10: 10:00:00 via INTRAVENOUS

## 2017-08-10 MED ORDER — LIDOCAINE HCL (PF) 2 % IJ SOLN
INTRAMUSCULAR | Status: AC
  Filled 2017-08-10: qty 10

## 2017-08-10 MED ORDER — MIDAZOLAM HCL 2 MG/2ML IJ SOLN
INTRAMUSCULAR | Status: DC | PRN
  Administered 2017-08-10: 2 mg via INTRAVENOUS

## 2017-08-10 MED ORDER — FENTANYL CITRATE (PF) 100 MCG/2ML IJ SOLN
INTRAMUSCULAR | Status: DC | PRN
  Administered 2017-08-10: 50 ug via INTRAVENOUS

## 2017-08-10 MED ORDER — CEFAZOLIN SODIUM-DEXTROSE 2-4 GM/100ML-% IV SOLN
2.0000 g | INTRAVENOUS | Status: AC
  Administered 2017-08-10: 2 g via INTRAVENOUS

## 2017-08-10 MED ORDER — FENTANYL CITRATE (PF) 100 MCG/2ML IJ SOLN
INTRAMUSCULAR | Status: AC
  Filled 2017-08-10: qty 2

## 2017-08-10 MED ORDER — EPHEDRINE SULFATE 50 MG/ML IJ SOLN
INTRAMUSCULAR | Status: DC | PRN
  Administered 2017-08-10 (×2): 5 mg via INTRAVENOUS

## 2017-08-10 MED ORDER — DEXAMETHASONE SODIUM PHOSPHATE 10 MG/ML IJ SOLN
INTRAMUSCULAR | Status: DC | PRN
  Administered 2017-08-10: 5 mg via INTRAVENOUS

## 2017-08-10 MED ORDER — LIDOCAINE HCL (CARDIAC) PF 100 MG/5ML IV SOSY
PREFILLED_SYRINGE | INTRAVENOUS | Status: DC | PRN
  Administered 2017-08-10: 60 mg via INTRAVENOUS

## 2017-08-10 MED ORDER — KETOROLAC TROMETHAMINE 30 MG/ML IJ SOLN
INTRAMUSCULAR | Status: DC | PRN
  Administered 2017-08-10: 30 mg via INTRAVENOUS

## 2017-08-10 MED ORDER — PROPOFOL 10 MG/ML IV BOLUS
INTRAVENOUS | Status: AC
  Filled 2017-08-10: qty 20

## 2017-08-10 MED ORDER — KETOROLAC TROMETHAMINE 30 MG/ML IJ SOLN
INTRAMUSCULAR | Status: AC
  Filled 2017-08-10: qty 1

## 2017-08-10 MED ORDER — ONDANSETRON HCL 4 MG/2ML IJ SOLN: 4.0000 mg | Freq: Once | INTRAMUSCULAR | Status: DC | PRN

## 2017-08-10 MED ORDER — BUPIVACAINE-EPINEPHRINE (PF) 0.25% -1:200000 IJ SOLN
INTRAMUSCULAR | Status: AC
  Filled 2017-08-10: qty 30

## 2017-08-10 MED ORDER — ONDANSETRON HCL 4 MG/2ML IJ SOLN
INTRAMUSCULAR | Status: DC | PRN
  Administered 2017-08-10: 4 mg via INTRAVENOUS

## 2017-08-10 MED ORDER — ACETAMINOPHEN 10 MG/ML IV SOLN
INTRAVENOUS | Status: AC
  Filled 2017-08-10: qty 100

## 2017-08-10 MED ORDER — PROPOFOL 10 MG/ML IV BOLUS
INTRAVENOUS | Status: DC | PRN
  Administered 2017-08-10: 150 mg via INTRAVENOUS

## 2017-08-10 MED ORDER — HYDROCODONE-ACETAMINOPHEN 5-325 MG PO TABS: 1.0000 | ORAL_TABLET | Freq: Four times a day (QID) | ORAL | 0 refills | Status: DC | PRN

## 2017-08-10 MED ORDER — MIDAZOLAM HCL 2 MG/2ML IJ SOLN
INTRAMUSCULAR | Status: AC
  Filled 2017-08-10: qty 2

## 2017-08-10 MED ORDER — BUPIVACAINE-EPINEPHRINE 0.25% -1:200000 IJ SOLN
INTRAMUSCULAR | Status: DC | PRN
  Administered 2017-08-10: 30 mL

## 2017-08-10 MED ORDER — FENTANYL CITRATE (PF) 100 MCG/2ML IJ SOLN: 25.0000 ug | INTRAMUSCULAR | Status: DC | PRN

## 2017-08-10 MED ORDER — GLYCOPYRROLATE 0.2 MG/ML IJ SOLN
INTRAMUSCULAR | Status: AC
  Filled 2017-08-10: qty 1

## 2017-08-10 MED ORDER — ACETAMINOPHEN 10 MG/ML IV SOLN
INTRAVENOUS | Status: DC | PRN
  Administered 2017-08-10: 1000 mg via INTRAVENOUS

## 2017-08-10 SURGICAL SUPPLY — 28 items
APPLIER CLIP 9.375 SM OPEN (CLIP)
CANISTER SUCT 1200ML W/VALVE (MISCELLANEOUS) ×2 IMPLANT
CHLORAPREP W/TINT 26ML (MISCELLANEOUS) ×2 IMPLANT
CLIP APPLIE 9.375 SM OPEN (CLIP) IMPLANT
DERMABOND ADVANCED (GAUZE/BANDAGES/DRESSINGS) ×1
DERMABOND ADVANCED .7 DNX12 (GAUZE/BANDAGES/DRESSINGS) ×1 IMPLANT
DRAPE CHEST BREAST 77X106 FENE (MISCELLANEOUS) ×2 IMPLANT
ELECT CAUTERY BLADE 6.4 (BLADE) ×2 IMPLANT
ELECT REM PT RETURN 9FT ADLT (ELECTROSURGICAL) ×2
ELECTRODE REM PT RTRN 9FT ADLT (ELECTROSURGICAL) ×1 IMPLANT
GLOVE BIO SURGEON STRL SZ7 (GLOVE) ×2 IMPLANT
GLOVE INDICATOR 7.5 STRL GRN (GLOVE) ×2 IMPLANT
GOWN STRL REUS W/ TWL LRG LVL3 (GOWN DISPOSABLE) ×2 IMPLANT
GOWN STRL REUS W/TWL LRG LVL3 (GOWN DISPOSABLE) ×2
KIT TURNOVER KIT A (KITS) ×2 IMPLANT
MARGIN MAP 10MM (MISCELLANEOUS) ×2 IMPLANT
NEEDLE HYPO 22GX1.5 SAFETY (NEEDLE) ×2 IMPLANT
PACK BASIN MINOR ARMC (MISCELLANEOUS) ×2 IMPLANT
SPONGE LAP 18X18 RF (DISPOSABLE) ×2 IMPLANT
SUT MNCRL 4-0 (SUTURE) ×2
SUT MNCRL 4-0 27XMFL (SUTURE) ×2
SUT SILK 2 0 SH (SUTURE) ×2 IMPLANT
SUT VIC AB 2-0 SH 27 (SUTURE) ×2
SUT VIC AB 2-0 SH 27XBRD (SUTURE) ×2 IMPLANT
SUT VIC AB 3-0 SH 27 (SUTURE) ×2
SUT VIC AB 3-0 SH 27X BRD (SUTURE) ×2 IMPLANT
SUTURE MNCRL 4-0 27XMF (SUTURE) ×2 IMPLANT
WATER STERILE IRR 1000ML POUR (IV SOLUTION) ×2 IMPLANT

## 2017-08-10 NOTE — H&P (Signed)
HPI Melinda Hall is a 57 y.o. female seen in consultation at the request of Dr. Rosana Hoes.  SHe had a recent screening mammogram  ( personally reviewed) showing some asymmetry in her left breast and underwent ultrasound of the left breast showing a lesion located at 2:00 on her left breast 1 cm from the nipple.  Biopsy performed, pathology results reviewed with the patient showing evidence of intraductal papilloma. She denies any breast changes.  No pain, no masses, no discharge. She did have a previous history of bilateral reduction mastoplasty several years ago as well as a gastric bypass. No family history of breast cancer.  No previous biopsies on her breast. Able to perform more than 4 METS of activity without any shortness of breath or chest pain. She is currently taking Cipro for UTI.  HPI      Past Medical History:  Diagnosis Date  . Dermoid tumor    hx of--left ovary  . Gastric bypass status for obesity 2004   s/p roux-en-y  . Generalized headaches    frequent  . Heart murmur   . History of cardiac murmur   . Osteoporosis 03/2016   DEXA -2.6 spine  . Salivary gland stone    x 2  . Smoker          Past Surgical History:  Procedure Laterality Date  . ABDOMINAL HYSTERECTOMY    . ANTERIOR CERVICAL DECOMP/DISCECTOMY FUSION  12/2016   Pool  . APPENDECTOMY  1980  . BILATERAL OOPHORECTOMY  ~1992   for dermoid ovarian cysts  . CHOLECYSTECTOMY  2000s  . COLONOSCOPY WITH PROPOFOL N/A 11/27/2015   TA rpt 5 yrs Lucilla Lame, MD)  . GASTRIC BYPASS  2004   roux en y [facility])  . POLYPECTOMY  11/27/2015   Procedure: POLYPECTOMY;  Surgeon: Lucilla Lame, MD;  Location: [place];  Service: Endoscopy;;  . REDUCTION MAMMAPLASTY Bilateral 1990's         Family History  Problem Relation Age of Onset  . Cancer Father 68       leukemia  . Hypertension Mother   . Diabetes Neg Hx   . Stroke Neg Hx   . Coronary artery disease Neg Hx   .  Breast cancer Neg Hx     Social History Social History        Tobacco Use  . Smoking status: Former Smoker    Packs/day: 0.25    Years: 20.00    Pack years: 5.00    Types: Cigarettes    Last attempt to quit: 10/31/2012    Years since quitting: 4.6  . Smokeless tobacco: Never Used  Substance Use Topics  . Alcohol use: Yes    Comment: 1-2 drinks per month  . Drug use: No    No Known Allergies        Current Outpatient Medications  Medication Sig Dispense Refill  . calcium carbonate (OS-CAL) 600 MG TABS tablet Take 600 mg by mouth daily with breakfast.    . Cholecalciferol (VITAMIN D3) 1000 units CAPS Take 3 capsules (3,000 Units total) by mouth daily.    . ciprofloxacin (CIPRO) 500 MG tablet Take 1 tablet (500 mg total) by mouth 2 (two) times daily. 14 tablet 0  . ferrous sulfate 325 (65 FE) MG tablet Take 1 tablet (325 mg total) by mouth daily with breakfast.    . Prasterone (INTRAROSA) 6.5 MG INST Place 1 suppository vaginally daily. 30 each 2  . vitamin B-12 (CYANOCOBALAMIN) 1000 MCG tablet  Take 1 tablet (1,000 mcg total) by mouth daily.     No current facility-administered medications for this visit.      Review of Systems Full ROS  was asked and was negative except for the information on the HPI  Physical Exam. CONSTITUTIONAL: NAD EYES: Pupils are equal, round, and reactive to light, Sclera are non-icteric. EARS, NOSE, MOUTH AND THROAT: The oropharynx is clear. The oral mucosa is pink and moist. Hearing is intact to voice. LYMPH NODES:  Lymph nodes in the neck are normal. RESPIRATORY:  Lungs are clear. There is normal respiratory effort, with equal breath sounds bilaterally, and without pathologic use of accessory muscles. CARDIOVASCULAR: Heart is regular without murmurs, gallops, or rubs. BREAST; previous biopsy site, previus mammoplasty scars. No masses, No skin changes. Nipple is normal. No D/C. Axilla free of disease GI: The  abdomen is  soft, nontender, and nondistended. There are no palpable masses. There is no hepatosplenomegaly. There are normal bowel sounds in all quadrants. GU: Rectal deferred.   MUSCULOSKELETAL: Normal muscle strength and tone. No cyanosis or edema.   SKIN: Turgor is good and there are no pathologic skin lesions or ulcers. NEUROLOGIC: Motor and sensation is grossly normal. Cranial nerves are grossly intact. PSYCH:  Oriented to person, place and time. Affect is normal.  Data Reviewed  I have personally reviewed the patient's imaging, laboratory findings and medical records.    Assessment/Plan 57 year old female with a newly diagnosed intraductal papilloma without evidence of DCIS malignancy or atypia. I Had a lengthy discussion with the patient regarding this high risk benign lesion.  I discussed with her my recommendation for needle guided excisional biopsy to exclude any potential areas of DCIS. I have discussed with the patient in detail about the procedure, risk, benefits and possible complications including but not limited to: Bleeding, infection, seroma, pain, need for re-intervention, the chance of having this lesion upgraded to DCIS or even invasive carcinoma.  She understands and wishes to proceed.  Extensive counseling provided A copy of this report will be sent to the referring provider Plan for  needle guided lumpectomy of Left breast  Caroleen Hamman, MD FACS

## 2017-08-10 NOTE — Anesthesia Post-op Follow-up Note (Signed)
Anesthesia QCDR form completed.        

## 2017-08-10 NOTE — Anesthesia Preprocedure Evaluation (Signed)
Anesthesia Evaluation  Patient identified by MRN, date of birth, ID band Patient awake    Reviewed: Allergy & Precautions, NPO status , Patient's Chart, lab work & pertinent test results  History of Anesthesia Complications Negative for: history of anesthetic complications  Airway Mallampati: II       Dental  (+) Loose   Pulmonary neg sleep apnea, neg COPD, former smoker,           Cardiovascular (-) hypertension(-) Past MI and (-) CHF (-) dysrhythmias + Valvular Problems/Murmurs (remote hx)      Neuro/Psych neg Seizures Depression    GI/Hepatic Neg liver ROS, neg GERD  ,  Endo/Other  neg diabetes  Renal/GU negative Renal ROS     Musculoskeletal   Abdominal   Peds  Hematology   Anesthesia Other Findings   Reproductive/Obstetrics                             Anesthesia Physical Anesthesia Plan  ASA: II  Anesthesia Plan: General   Post-op Pain Management:    Induction:   PONV Risk Score and Plan: 3 and Dexamethasone, Ondansetron and Midazolam  Airway Management Planned: LMA  Additional Equipment:   Intra-op Plan:   Post-operative Plan:   Informed Consent: I have reviewed the patients History and Physical, chart, labs and discussed the procedure including the risks, benefits and alternatives for the proposed anesthesia with the patient or authorized representative who has indicated his/her understanding and acceptance.     Plan Discussed with:   Anesthesia Plan Comments:         Anesthesia Quick Evaluation

## 2017-08-10 NOTE — Transfer of Care (Signed)
Immediate Anesthesia Transfer of Care Note  Patient: Melinda Hall  Procedure(s) Performed: BREAST LUMPECTOMY WITH NEEDLE LOCALIZATION (Left )  Patient Location: PACU  Anesthesia Type:General  Level of Consciousness: sedated  Airway & Oxygen Therapy: Patient Spontanous Breathing and Patient connected to face mask oxygen  Post-op Assessment: Report given to RN and Post -op Vital signs reviewed and stable  Post vital signs: Reviewed  Last Vitals:  Vitals Value Taken Time  BP 101/63 08/10/2017 12:02 PM  Temp    Pulse 70 08/10/2017 12:02 PM  Resp 14 08/10/2017 12:02 PM  SpO2 99 % 08/10/2017 12:02 PM  Vitals shown include unvalidated device data.  Last Pain:  Vitals:   08/10/17 0949  TempSrc: Oral  PainSc: 0-No pain         Complications: No apparent anesthesia complications

## 2017-08-10 NOTE — Anesthesia Postprocedure Evaluation (Signed)
Anesthesia Post Note  Patient: Melinda Hall  Procedure(s) Performed: BREAST LUMPECTOMY WITH NEEDLE LOCALIZATION (Left )  Patient location during evaluation: PACU Anesthesia Type: General Level of consciousness: awake and alert Pain management: pain level controlled Vital Signs Assessment: post-procedure vital signs reviewed and stable Respiratory status: spontaneous breathing and respiratory function stable Cardiovascular status: stable Anesthetic complications: no     Last Vitals:  Vitals:   08/10/17 1300 08/10/17 1321  BP: 100/68   Pulse:  (!) 58  Resp:    Temp:    SpO2: 98%     Last Pain:  Vitals:   08/10/17 1300  TempSrc:   PainSc: 0-No pain                 KEPHART,WILLIAM K

## 2017-08-10 NOTE — Op Note (Signed)
  Pre-operative Diagnosis: Left Breast mass  Post-operative Diagnosis: Same   Surgeon: Caroleen Hamman, MD FACS  Anesthesia: GETA  Procedure:  Left Partial mastectomy, needle directed   Findings:Needle and wire within excised specimen   Estimated Blood Loss: Minimal         Drains: None         Specimens: markers used       Complications: none              Condition: Stable    Procedure Details  The patient was seen again in the Holding Room. The benefits, complications, treatment options, and expected outcomes were discussed with the patient. The risks of bleeding, infection, recurrence of symptoms, failure to resolve symptoms, hematoma, seroma, open wound, cosmetic deformity, and the need for further surgery were discussed.  The patient was taken to Operating Room, identified as Melinda Hall and the procedure verified.  A Time Out was held and the above information confirmed.  Prior to the induction of general anesthesia, antibiotic prophylaxis was administered. VTE prophylaxis was in place. Appropriate anesthesia was then administered and tolerated well. The chest was prepped with Chloraprep and draped in the sterile fashion. The patient was positioned in the supine position.   Attention was turned to the needle localization site is an ultrasound device were able to localized the wire and the tip of it.  We marked our incision and created our periareolar incision in the standard fashion.  Flaps were developed with electrocautery and Dissection around the needle to perform a partial mastectomy with adequate margins. This was done with electrocautery and sharp dissection.  Removed and marked in the standard fashion.  We obtained intraoperatively x-ray of the specimen and it contained both the clip and the wire.  Additional Marcaine was infiltrated into the skin and subcutaneous tissues of the cavity. Once assuring that hemostasis was adequate and checked multiple times the wound was  closed with interrupted 2-0 Vicryl followed by 4-0 subcuticular Monocryl sutures. Dermabond was used to coat the skin  Patient was taken to the recovery room in stable condition.    Caroleen Hamman, MD, FACS

## 2017-08-10 NOTE — Discharge Instructions (Addendum)
Mastectoma, cuidados posteriores Lumpectomy, Care After Lea esta informacin sobre cmo cuidarse despus del procedimiento. Su mdico tambin podr darle instrucciones ms especficas. Comunquese con su mdico si tiene problemas o preguntas. Qu puedo esperar despus del procedimiento? Despus del procedimiento, es comn tener los siguientes sntomas:  [employer].  Inflamacin mamaria.  Rigidez en el brazo o el hombro.  Cambio en la forma y la sensibilidad de las [place].  Tejido cicatricial que se siente duro al tacto en la zona donde se extrajo el ndulo.  Siga estas instrucciones en su casa: El bao  Tome baos con esponja hasta que el mdico la autorice a ducharse o a Teacher, early years/pre de inmersin.  No tome baos de inmersin, no nade ni use el jacuzzi hasta que el mdico lo autorice. Cuidados de la incisin  Siga las indicaciones del mdico acerca del cuidado de la incisin. Haga lo siguiente: ? Lvese las manos con agua y jabn antes de Quarry manager las vendas (vendaje). Use desinfectante para manos si no dispone de [place]. ? Cambie el vendaje como se lo haya indicado el mdico. ? No retire los puntos (suturas), la goma para cerrar la piel o las tiras [place]. Es posible que estos deban quedar puestos en la piel durante 2semanas o ms tiempo. Si los bordes de las tiras adhesivas empiezan a despegarse y Therapist, sports, puede recortar los que estn sueltos. No retire las tiras [facility] por completo a menos que el mdico se lo indique.  [place] zona de la incisin para detectar signos de infeccin. Est atento a los siguientes signos: ? Aumento del enrojecimiento, de la hinchazn o del dolor. ? Ms lquido [name]. ? Calor. ? Pus o mal olor.  Mantenga el vendaje limpio y seco.    Si la enviaron de regreso a su casa con un drenaje quirrgico colocado, siga las instrucciones del mdico sobre cmo vaciarlo. Actividad  Retome sus actividades  normales como se lo haya indicado el mdico. Pregntele al mdico qu actividades son seguras para usted.  Evite las actividades que requieren mucha energa (extenuantes).  Evite cualquier actividad que pueda causarle una lesin en el brazo que est del lado de la [place].  No levante ningn objeto que pese ms de 10libras (4,5kg). Evite levantar objetos con el brazo que est del lado de la [place].  No cargue objetos pesados [employer].  Despus de que le saquen el drenaje, debe realizar ejercicios para evitar que el brazo se entumezca e hinche. Consulte al [employer] tipos de ejercicios que son seguros para usted. Instrucciones generales  [employer] de venta libre y los recetados solamente como se lo haya indicado el mdico.  Puede seguir su dieta habitual.  Use un sostn de soporte como se lo haya indicado su mdico.  Cuando est sentada o acostada, levante (eleve) el brazo por encima del nivel del corazn.  No use anillos, pulseras ni otros accesorios ajustados en el brazo, la [place] o los dedos del lado de la [place]. Controles  Concurra a todas las visitas de control como se lo haya indicado el mdico. Esto es importante.  Es posible que deba examinarse para Hydrographic surveyor lquido extra alrededor de los ganglios linfticos (linfoedema). Siga las indicaciones del mdico sobre la frecuencia en que debe realizarse los controles.  Si le han extrado algn ganglio linftico durante el procedimiento, asegrese de darles toda la informacin a sus mdicos. Esta es una informacin importante que debe proporcionar  antes de ciertos procedimientos, como una extraccin de sangre para donar o la medicin de la presin arterial. Comunquese con un mdico si:  Le aparece una erupcin cutnea.  Tiene fiebre.  El medicamento para Glass blower/designer no le hace efecto.  La hinchazn, la debilidad o el entumecimiento del brazo no han mejorado despus de unas semanas.  Tiene una  hinchazn nueva en la mama o el brazo.  Tiene ms enrojecimiento, hinchazn o dolor en la zona de la incisin.  Le sale ms lquido o sangre de la incisin.  La incisin est caliente al tacto.  Tiene pus o percibe que sale mal olor del lugar de la incisin. Solicite ayuda de inmediato si:  Tiene un dolor muy intenso en la mama o el brazo.  Siente dolor en el pecho.  Tiene dificultad para respirar. Esta informacin no tiene Marine scientist el consejo del mdico. Asegrese de hacerle al mdico cualquier pregunta que tenga. Document Released: 11/07/2012 Document Revised: 04/26/2016 Document Reviewed: 07/21/2015 Elsevier Interactive Patient Education  2018 [place] After This sheet gives you information about how to care for yourself after your procedure. Your health care provider may also give you more specific instructions. If you have problems or questions, contact your health care provider. What can I expect after the procedure? After the procedure, it is common to have:  Breast swelling.  Breast tenderness.  Stiffness in your arm or shoulder.  A change in the shape and feel of your breast.  Scar tissue that feels hard to the touch in the area where the lump was removed.  Follow these instructions at home: Bathing  Take sponge baths until your health care provider says that you can start showering or bathing.  Do not take baths, swim, or use a hot tub until your health care provider approves. Incision care  Follow instructions from your health care provider about how to take care of your incision. Make sure you: ? Wash your hands with soap and water before you change your bandage (dressing). If soap and water are not available, use hand sanitizer. ? Change your dressing as told by your health care provider. ? Leave stitches (sutures), skin glue, or adhesive strips in place. These skin closures may need to stay in place for 2 weeks or  longer. If adhesive strip edges start to loosen and curl up, you may trim the loose edges. Do not remove adhesive strips completely unless your health care provider tells you to do that.  Check your incision area every day for signs of infection. Check for: ? More redness, swelling, or pain. ? More fluid or blood. ? Warmth. ? Pus or a bad smell.  Keep your dressing clean and dry.  If you were sent home with a surgical drain in place, follow instructions from your health care provider about emptying it. Activity  Return to your normal activities as told by your health care provider. Ask your health care provider what activities are safe for you.  Avoid activities that require a lot of energy (are strenuous).  Be careful to avoid any activities that could cause an injury to your arm on the side of your surgery.  Do not lift anything that is heavier than 10 lb (4.5 kg). Avoid lifting with the arm that is on the side of your surgery.  Do not carry heavy objects on your shoulder.  After your drain is removed, you should perform exercises to  keep your arm from getting stiff and swollen. Talk with your health care provider about which exercises are safe for you. General instructions  Take over-the-counter and prescription medicines only as told by your health care provider.  You may eat what you usually do.  Wear a supportive bra as told by your health care provider.  Raise (elevate) your arm above the level of your heart while you are sitting or lying down.  Do not wear tight jewelry on your arm, wrist, or fingers on the side of your surgery. Follow-up  Keep all follow-up visits as told by your health care provider. This is important.  You may need to be screened for extra fluid around the lymph nodes (lymphedema). Follow instructions from your health care provider about how often you should be checked.  If you had any lymph nodes removed during your procedure, be sure to tell all  of your health care providers. This is important information to share before you are involved in certain procedures, such as giving blood or having your blood pressure taken. Contact a health care provider if:  You develop a rash.  You have a fever.  Your pain medicine is not working.  Your swelling, weakness, or numbness in your arm has not improved after a few weeks.  You have new swelling in your breast or arm.  You have more redness, swelling, or pain in your incision area.  You have more fluid or blood coming from your incision.  Your incision feels warm to the touch.  You have pus or a bad smell coming from your incision. Get help right away if:  You have very bad pain in your breast or arm.  You have chest pain.  You have difficulty breathing. This information is not intended to replace advice given to you by your health care provider. Make sure you discuss any questions you have with your health care provider. Document Released: 02/02/2006 Document Revised: 09/30/2015 Document Reviewed: 09/30/2015 Elsevier Interactive Patient Education  2018 [place] AMBULATORIA       Instruccionnes de alta       1.  Las drogas que se Statistician en su cuerpo [employer], asi            que por las proximas 24 horas usted no debe:   Conducir Scientist, research (medical)) un automovil   Hacer ninguna decision legal   Tomar ninguna bebida alcoholica  2.  A) [place] puede comenzar una dieta regular.  Es mejor que hoy empiece con           liquidos y gradualmente anada comidas solidas.       B) Puede comer cualquier comida que desee pero es mejor empezar con liquidos,                      luego sopitas con galletas saladas y gradualmente llegar a las comidas solidas.  3.  Por favor avise a su medico inmediatamente si usted tiene algun sangrado anormal,       tiene dificultad con la respiracion, enrojecimiento y Social research officer, government en el sitio de la cirugia, [place],        fiebro o dolor que se alivia con [place].     AMBULATORY SURGERY  DISCHARGE INSTRUCTIONS   1) The drugs that you were given will stay in your system until tomorrow so for the next 24 hours you should not:  A) Drive an automobile B)  Make any legal decisions C) Drink any alcoholic beverage   2) You may resume regular meals tomorrow.  Today it is better to start with liquids and gradually work up to solid foods.  You may eat anything you prefer, but it is better to start with liquids, then soup and crackers, and gradually work up to solid foods.   3) Please notify your doctor immediately if you have any unusual bleeding, trouble breathing, redness and pain at the surgery site, drainage, fever, or pain not relieved by medication.    4) Additional Instructions:        Please contact your physician with any problems or Same Day Surgery at [phone], Monday through Friday 6 am to 4 pm, or [place] at [facility] number at [phone].

## 2017-08-10 NOTE — Anesthesia Procedure Notes (Signed)
Procedure Name: LMA Insertion Date/Time: 08/10/2017 11:00 AM Performed by: Rolla Plate, CRNA Pre-anesthesia Checklist: Patient identified, Patient being monitored, Timeout performed, Emergency Drugs available and Suction available Patient Re-evaluated:Patient Re-evaluated prior to induction Oxygen Delivery Method: Circle system utilized Preoxygenation: Pre-oxygenation with 100% oxygen Induction Type: IV induction Ventilation: Mask ventilation without difficulty LMA: LMA inserted LMA Size: 4.0 Tube type: Oral Number of attempts: 1 Placement Confirmation: positive ETCO2 and breath sounds checked- equal and bilateral Tube secured with: Tape Dental Injury: Teeth and Oropharynx as per pre-operative assessment

## 2017-08-11 ENCOUNTER — Encounter: Payer: Self-pay | Admitting: Surgery

## 2017-08-11 LAB — SURGICAL PATHOLOGY

## 2017-08-12 ENCOUNTER — Encounter: Payer: Self-pay | Admitting: Family Medicine

## 2017-08-21 ENCOUNTER — Encounter: Payer: Self-pay | Admitting: Surgery

## 2017-08-21 ENCOUNTER — Ambulatory Visit (INDEPENDENT_AMBULATORY_CARE_PROVIDER_SITE_OTHER): Payer: BLUE CROSS/BLUE SHIELD | Admitting: Surgery

## 2017-08-21 VITALS — BP 134/95 | HR 69 | Temp 98.0°F | Resp 14 | Ht 63.0 in | Wt 190.0 lb

## 2017-08-21 DIAGNOSIS — Z09 Encounter for follow-up examination after completed treatment for conditions other than malignant neoplasm: Secondary | ICD-10-CM

## 2017-08-21 NOTE — Patient Instructions (Addendum)
Continue self breast exams. Call office for any new breast issues or concerns.  Follow up in 6 months with a Diagnostic Mammogram and office follow up with Dr Dahlia Byes. We will notify you of these appointments.

## 2017-08-22 NOTE — Progress Notes (Signed)
S/p lumpectomy No complaints Path d/w pt in detail  PE NAD, wound healing well, no infection, nipple viable  A/p Doing well RTC 6 months w mammo

## 2017-10-27 ENCOUNTER — Other Ambulatory Visit: Payer: BLUE CROSS/BLUE SHIELD

## 2017-10-27 ENCOUNTER — Other Ambulatory Visit: Payer: Self-pay | Admitting: Family Medicine

## 2017-10-27 DIAGNOSIS — E611 Iron deficiency: Secondary | ICD-10-CM

## 2017-10-27 DIAGNOSIS — Z1159 Encounter for screening for other viral diseases: Secondary | ICD-10-CM

## 2017-10-27 DIAGNOSIS — Z9884 Bariatric surgery status: Secondary | ICD-10-CM

## 2017-11-01 ENCOUNTER — Encounter: Payer: Self-pay | Admitting: Family Medicine

## 2017-11-01 ENCOUNTER — Ambulatory Visit (INDEPENDENT_AMBULATORY_CARE_PROVIDER_SITE_OTHER)
Admission: RE | Admit: 2017-11-01 | Discharge: 2017-11-01 | Disposition: A | Discharge status: Home / Self Care | Payer: BLUE CROSS/BLUE SHIELD | Source: Ambulatory Visit | Attending: Family Medicine | Admitting: Family Medicine

## 2017-11-01 ENCOUNTER — Ambulatory Visit (INDEPENDENT_AMBULATORY_CARE_PROVIDER_SITE_OTHER): Payer: BLUE CROSS/BLUE SHIELD | Admitting: Family Medicine

## 2017-11-01 VITALS — BP 134/86 | HR 84 | Temp 98.0°F | Ht 63.25 in | Wt 190.8 lb

## 2017-11-01 DIAGNOSIS — M79661 Pain in right lower leg: Secondary | ICD-10-CM | POA: Diagnosis not present

## 2017-11-01 DIAGNOSIS — D242 Benign neoplasm of left breast: Secondary | ICD-10-CM

## 2017-11-01 DIAGNOSIS — M79604 Pain in right leg: Secondary | ICD-10-CM | POA: Diagnosis not present

## 2017-11-01 DIAGNOSIS — E611 Iron deficiency: Secondary | ICD-10-CM | POA: Diagnosis not present

## 2017-11-01 DIAGNOSIS — E669 Obesity, unspecified: Secondary | ICD-10-CM

## 2017-11-01 DIAGNOSIS — Z9884 Bariatric surgery status: Secondary | ICD-10-CM

## 2017-11-01 DIAGNOSIS — M810 Age-related osteoporosis without current pathological fracture: Secondary | ICD-10-CM

## 2017-11-01 DIAGNOSIS — Z23 Encounter for immunization: Secondary | ICD-10-CM

## 2017-11-01 DIAGNOSIS — Z0000 Encounter for general adult medical examination without abnormal findings: Secondary | ICD-10-CM

## 2017-11-01 NOTE — Assessment & Plan Note (Signed)
Point tender along distal fibula with mild swelling ?callus of bone - check xray today.

## 2017-11-01 NOTE — Progress Notes (Signed)
BP 134/86 (BP Location: Left Arm, Patient Position: Sitting, Cuff Size: Normal)   Pulse 84   Temp 98 F (36.7 C) (Oral)   Ht 5' 3.25" (1.607 m)   Wt 190 lb 12 oz (86.5 kg)   SpO2 95%   BMI 33.52 kg/m    CC: CPE Subjective:    Patient ID: Melinda Hall, female    DOB: 1960/09/03, 57 y.o.   MRN: [id number]  HPI: Melinda Hall is a 57 y.o. female presenting on 11/01/2017 for Annual Exam   Had cervical neck decompressive surgery. Residual L foot numbness/paresthesia. Has decided against pursuing lumbar evaluation. Some weakness at R lateral ankle, denies inciting trauma/injury - she did have several ankle sprains growing up.  Lab Results  Component Value Date   VITAMINB12 330 10/19/2016    Preventative: Colonoscopy 11/2015 - TA rpt 5 yrs Allen Norris) Well woman with OBGYN (Dr Kennon Rounds) completed 05/2017. S/p bilateral oophorectomy for dermoid cysts.  Mammo - 07/2017 - abnormal spots L breast - initial spot not biopsied - rec 6 mo f/u. 2nd spot returned intraductal papilloma - followed by gen surgery. This is scheduled for 12/2017. DEXA 11/2015: T -2.6 spine osteoporosis, saw endo Dr Graceann Congress, rec against treatment at this time. rec rpt 12/2017.  Flu shot yearly Tdap 2018 Seat belt use discussed Sunscreen use discussed. No changing moles on skin. 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  Diet: good water, fruits/vegetables daily   Relevant past medical, surgical, family and social history reviewed and updated as indicated. Interim medical history since our last visit reviewed. Allergies and medications reviewed and updated. Outpatient Medications Prior to Visit  Medication Sig Dispense Refill  . calcium carbonate (OS-CAL) 600 MG TABS tablet Take 600 mg by mouth daily with  breakfast.    . Cholecalciferol (VITAMIN D3) 1000 units CAPS Take 3 capsules (3,000 Units total) by mouth daily.    . ferrous sulfate 325 (65 FE) MG tablet Take 1 tablet (325 mg total) by mouth daily with breakfast.    . Prasterone (INTRAROSA) 6.5 MG INST Place 1 suppository vaginally daily. 30 each 2  . vitamin B-12 (CYANOCOBALAMIN) 1000 MCG tablet Take 1 tablet (1,000 mcg total) by mouth daily.     Facility-Administered Medications Prior to Visit  Medication Dose Route Frequency Provider Last Rate Last Dose  . bupivacaine liposome (EXPAREL) 1.3 % injection 266 mg  20 mL Infiltration Once Pabon, Diego F, MD         Per HPI unless specifically indicated in ROS section below Review of Systems  Constitutional: Negative for activity change, appetite change, chills, fatigue, fever and unexpected weight change.  HENT: Negative for hearing loss.   Eyes: Negative for visual disturbance.  Respiratory: Negative for cough, chest tightness, shortness of breath and wheezing.   Cardiovascular: Negative for chest pain, palpitations and leg swelling.  Gastrointestinal: Positive for abdominal pain and diarrhea. Negative for abdominal distention, blood in stool, constipation, nausea and vomiting.       Recent stomach bug  Genitourinary: Positive for hematuria (recent UTI - now resolved). Negative for difficulty urinating.  Musculoskeletal: Negative for arthralgias, myalgias and neck pain.  Skin: Negative for rash.  Neurological: Positive for headaches (with stomach bug). Negative for dizziness, seizures and syncope.  Hematological: Negative for adenopathy.  Does not bruise/bleed easily.  Psychiatric/Behavioral: Negative for dysphoric mood. The patient is not nervous/anxious.        Objective:    BP 134/86 (BP Location: Left Arm, Patient Position: Sitting, Cuff Size: Normal)   Pulse 84   Temp 98 F (36.7 C) (Oral)   Ht 5' 3.25" (1.607 m)   Wt 190 lb 12 oz (86.5 kg)   SpO2 95%   BMI 33.52 kg/m     Wt Readings from Last 3 Encounters:  11/01/17 190 lb 12 oz (86.5 kg)  08/21/17 190 lb (86.2 kg)  08/10/17 187 lb (84.8 kg)    Physical Exam  Constitutional: She is oriented to person, place, and time. She appears well-developed and well-nourished. No distress.  HENT:  Head: Normocephalic and atraumatic.  Right Ear: Hearing, tympanic membrane, external ear and ear canal normal.  Left Ear: Hearing, tympanic membrane, external ear and ear canal normal.  Nose: Nose normal.  Mouth/Throat: Uvula is midline, oropharynx is clear and moist and mucous membranes are normal. No oropharyngeal exudate, posterior oropharyngeal edema or posterior oropharyngeal erythema.  Eyes: Pupils are equal, round, and reactive to light. Conjunctivae and EOM are normal. No scleral icterus.  Neck: Normal range of motion. Neck supple.  Cardiovascular: Normal rate, regular rhythm, normal heart sounds and intact distal pulses.  No murmur heard. Pulses:      Radial pulses are 2+ on the right side, and 2+ on the left side.  Pulmonary/Chest: Effort normal and breath sounds normal. No respiratory distress. She has no wheezes. She has no rales.  Abdominal: Soft. Bowel sounds are normal. She exhibits no distension and no mass. There is no tenderness. There is no rebound and no guarding.  Musculoskeletal: Normal range of motion. She exhibits no edema.  Mild swelling distal lateral R fibula, point tender to palpation No ankle laxity or tenderness No malleolar tenderness  Lymphadenopathy:    She has no cervical adenopathy.  Neurological: She is alert and oriented to person, place, and time.  CN grossly intact, station and gait intact  Skin: Skin is warm and dry. No rash noted.  Psychiatric: She has a normal mood and affect. Her behavior is normal. Judgment and thought content normal.  Nursing note and vitals reviewed.     Assessment & Plan:   Problem List Items Addressed This Visit    Status post gastric bypass for  obesity    She has been regular with vitamin supplements. Update labs today.       Relevant Orders   Lipid panel   Comprehensive metabolic panel   TSH   CBC with Differential/Platelet   Vitamin B12   Ferritin   IBC panel   VITAMIN D 25 Hydroxy (Vit-D Deficiency, Fractures)   Magnesium   Osteoporosis    Update dexa. Regular with her calcium and vit D daily.       Relevant Orders   DG Bone Density   VITAMIN D 25 Hydroxy (Vit-D Deficiency, Fractures)   Obesity, Class I, BMI 30-34.9    Encouraged healthy diet and lifestyle changes to affect sustainable weight loss.       Leg pain, lateral, right    Point tender along distal fibula with mild swelling ?callus of bone - check xray today.       Relevant Orders   DG Tibia/Fibula Right   Iron deficiency   Relevant Orders   CBC with Differential/Platelet   Ferritin   IBC panel   Intraductal papilloma of breast, left  Appreciate surgical care.       Healthcare maintenance - Primary    Preventative protocols reviewed and updated unless pt declined. Discussed healthy diet and lifestyle.        Other Visit Diagnoses    Need for influenza vaccination       Relevant Orders   Flu Vaccine QUAD 36+ mos IM (Completed)       No orders of the defined types were placed in this encounter.  Orders Placed This Encounter  Procedures  . DG Bone Density    Standing Status:   Future    Standing Expiration Date:   01/02/2019    Scheduling Instructions:     Please schedule on same day as mammogram    Order Specific Question:   Reason for Exam (SYMPTOM  OR DIAGNOSIS REQUIRED)    Answer:   osteoporosis f/u    Order Specific Question:   Is the patient pregnant?    Answer:   No    Order Specific Question:   Preferred imaging location?    Answer:    [facility]  . DG Tibia/Fibula Right    Standing Status:   Future    Number of Occurrences:   1    Standing Expiration Date:   01/02/2019    Order Specific Question:   Reason for  Exam (SYMPTOM  OR DIAGNOSIS REQUIRED)    Answer:   R lateral leg pain    Order Specific Question:   Is patient pregnant?    Answer:   No    Order Specific Question:   Preferred imaging location?    Answer:   [facility]    Order Specific Question:   Radiology Contrast Protocol - do NOT remove file path    Answer:   [email or web address]  . Flu Vaccine QUAD 36+ mos IM  . Lipid panel  . Comprehensive metabolic panel  . TSH  . CBC with Differential/Platelet  . Vitamin B12  . Ferritin  . IBC panel  . VITAMIN D 25 Hydroxy (Vit-D Deficiency, Fractures)  . Magnesium    Follow up plan: Return in about 1 year (around 11/02/2018) for annual exam, prior fasting for blood work.  [name], MD

## 2017-11-01 NOTE — Assessment & Plan Note (Signed)
Update dexa. Regular with her calcium and vit D daily.

## 2017-11-01 NOTE — Assessment & Plan Note (Signed)
Preventative protocols reviewed and updated unless pt declined. Discussed healthy diet and lifestyle.  

## 2017-11-01 NOTE — Assessment & Plan Note (Signed)
Appreciate surgical care.

## 2017-11-01 NOTE — Assessment & Plan Note (Addendum)
She has been regular with vitamin supplements. Update labs today.

## 2017-11-01 NOTE — Assessment & Plan Note (Signed)
Encouraged healthy diet and lifestyle changes to affect sustainable weight loss.  

## 2017-11-01 NOTE — Patient Instructions (Addendum)
Flu shot today Labs today xrays today. Haremos cita para densitometria a finales de este ao (mismo dia de mamograma). Regresar en 1 ao para proximo examen fisico.   Mantenimiento de la salud - Mujeres (Health Maintenance, Female) Un estilo de vida saludable y los cuidados preventivos pueden favorecer considerablemente a la salud y Musician. Pregunte a su mdico cul es el cronograma de exmenes peridicos apropiado para usted. Esta es una buena oportunidad para consultarlo sobre cmo prevenir enfermedades y [place] sano. Adems de los controles, hay muchas otras cosas que puede hacer usted mismo. Los expertos han realizado numerosas investigaciones [employer] cambios en el estilo de vida y las medidas de prevencin que, [place], lo ayudarn a mantenerse sano. Solicite a su mdico ms informacin. EL PESO Y LA DIETA Consuma una dieta saludable.  Asegrese de [employer] verduras, frutas, productos lcteos de bajo contenido de [place] y Advertising account planner.  No consuma muchos alimentos de alto contenido de grasas slidas, azcares agregados o sal.  Realice actividad fsica con regularidad. Esta es una de las prcticas ms importantes que puede hacer por su salud. ? La [name] de los adultos deben hacer ejercicio durante al menos 145mnutos por semana. El ejercicio debe aumentar la frecuencia cardaca y pActorla transpiracin (ejercicio de i[place]. ? La mayora de los adultos tambin deben hacer ejercicios de elongacin al mToysRusveces a la semana. Agregue esto al su plan de ejercicio de intensidad moderada. Mantenga un peso saludable.  El ndice de masa corporal ([facility] es una medida que puede utilizarse para identificar posibles problemas de [place]arris[place] Proporciona una estimacin de la grasa corporal basndose en el peso y la altura. Su mdico puede ayudarle a dRadiation protection practitionerIRivieray a lScientist, forensico mTheatre managerun peso saludable.  Para las mujeres de 20aos o ms: ? Un I[facility] menor de 18,5 se considera bajo peso. ? Un ISpecialty Surgery Center Of Connecticutentre 18,5 y 24,9 es normal. ? Un IOasis Hospitalentre 25 y 29,9 se considera sobrepeso. ? Un IMC de 30 o ms se considera obesidad. Observe los niveles de colesterol y lpidos en la sangre.  Debe comenzar a rEnglish as a second language teacherde lpidos y cResearch officer, trade unionen la sangre a los 20aos y luego repetirlos cada 564aos  Es posible que nAutomotive engineerlos niveles de colesterol con mayor frecuencia si: ? Sus niveles de lpidos y colesterol son altos. ? Es mayor de 524MWN ? Presenta un alto riesgo de padecer enfermedades cardacas. DETECCIN DE CNCER Cncer de pulmn  Se recomienda realizar exmenes de deteccin de cncer de pulmn a personas adultas entre 518y 854aos que estn en riesgo de dHorticulturist, commercialde pulmn por sus antecedentes de consumo de tabaco.  Se recomienda una tomografa computarizada de baja dosis de los pulmones todos los aos a las personas que: ? Fuman actualmente. ? Hayan dejado el hbito en algn momento en los ltimos 15aos. ? Hayan fumado durante 30aos un paquete diario. Un paquete-ao equivale a fumar un promedio de un paquete de cigarrillos diario durante un ao.  Los exmenes de deteccin anuales deben continuar hasta que hayan pasado 15aos desde que dej de fumar.  Ya no debern realizarse si tiene un problema de salud que le impida recibir tratamiento para eScience writerde pulmn. Cncer de mama  Practique la autoconciencia de la mama. Esto significa reconocer la apariencia normal de sus mamas y cmo las siente.  Tambin significa realizar autoexmenes regulares de lJohnson & Johnson Informe a su mdico sobre cualquier cambio, sin importar cun  pequeo sea.  Si tiene entre 20 y 69 aos, un mdico debe realizarle un examen clnico de las mamas como parte del examen regular de [place], cada 1 a 3aos.  Si tiene 40aos o ms, debe Information systems manager clnico de las Microsoft. Tambin considere realizarse una  [place] ([place]) todos los [place].  Si tiene antecedentes familiares de cncer de mama, hable con su mdico para someterse a un estudio gentico.  Si tiene alto riesgo de Chief Financial Officer de mama, hable con su mdico para someterse a Public house manager y [employer].  La evaluacin del gen del cncer de mama (BRCA) se recomienda a mujeres que tengan familiares con cnceres relacionados con el BRCA. Los cnceres relacionados con el BRCA incluyen los siguientes: ? [place]. ? Ovario. ? Trompas. ? Cnceres de peritoneo.  Los resultados de la evaluacin determinarn la necesidad de asesoramiento gentico y de [place] de BRCA1 y BRCA2. Cncer de cuello del tero El mdico puede recomendarle que se haga pruebas peridicas de deteccin de cncer de los rganos de la pelvis (ovarios, tero y vagina). Estas pruebas incluyen un examen plvico, que abarca controlar si se produjeron cambios microscpicos en la superficie del cuello del tero (prueba de Papanicolaou). Pueden recomendarle que se haga estas pruebas cada 3aos, a partir de los 21aos.  A las mujeres que tienen entre 30 y 21aos, los mdicos pueden recomendarles que se sometan a exmenes plvicos y pruebas de Papanicolaou cada 66aos, o a la prueba de Papanicolaou y el examen plvico en combinacin con estudios de deteccin del virus del papiloma humano (VPH) cada 5aos. Algunos tipos de VPH aumentan el riesgo de Chief Financial Officer de cuello del tero. La prueba para la deteccin del VPH tambin puede realizarse a mujeres de cualquier edad cuyos resultados de la prueba de Papanicolaou no sean claros.  Es posible que otros mdicos no recomienden exmenes de deteccin a mujeres no embarazadas que se consideran sujetos de bajo riesgo de Chief Financial Officer de pelvis y que no tienen sntomas. Pregntele al mdico si un examen plvico de deteccin es adecuado para usted.  Si ha recibido un tratamiento para Science writer  cervical o una enfermedad que podra causar cncer, necesitar realizarse una prueba de Papanicolaou y controles durante al menos 65 aos de concluido el [place]. Si no se ha hecho el Papanicolaou con regularidad, debern volver a evaluarse los factores de riesgo (como tener un nuevo compaero sexual), para Teacher, adult education si debe realizarse los estudios nuevamente. Algunas mujeres sufren problemas mdicos que aumentan la probabilidad de Museum/gallery curator cncer de cuello del tero. En estos casos, el mdico podr [employer] se realicen controles y pruebas de Papanicolaou con ms frecuencia. Cncer colorrectal  Este tipo de cncer puede detectarse y a menudo prevenirse.  Por lo general, los estudios de rutina se deben Medical laboratory scientific officer a Field seismologist a Proofreader de los 73 aos y [place] 61 aos.  Sin embargo, el mdico podr aconsejarle que lo haga antes, si tiene factores de riesgo para el cncer de colon.  Tambin puede recomendarle que use un kit de prueba para Hydrologist en la materia fecal.  Es posible que se use una pequea cmara en el extremo de un tubo para examinar directamente el colon (sigmoidoscopia o colonoscopia) a fin de Hydrographic surveyor formas tempranas de cncer colorrectal.  Los exmenes de rutina generalmente comienzan a los 48aos.  El examen directo del colon se debe repetir cada 5 a 10aos hasta los  75aos. Sin embargo, es posible que se realicen exmenes con mayor frecuencia, si se detectan formas tempranas de plipos precancerosos o pequeos bultos. Cncer de piel  Revise la piel de la cabeza a los pies con regularidad.  Informe a su mdico si aparecen nuevos lunares o los que tiene se modifican, especialmente en su forma y color.  Tambin notifique al mdico si tiene un lunar que es ms grande que el tamao de una goma de lpiz.  Siempre use pantalla solar. Aplique pantalla solar de Kerry Dory y repetida a lo largo del Training and development officer.  Protjase usando mangas y [employer], un sombrero de  ala ancha y gafas para el sol, siempre que se encuentre en el exterior. ENFERMEDADES CARDACAS, DIABETES E HIPERTENSIN ARTERIAL  La hipertensin arterial causa enfermedades cardacas y [place] el riesgo de ictus. La hipertensin arterial es ms probable en los siguientes casos: ? Las personas que tienen la presin arterial en el extremo del rango normal (100-139/85-89 mm Hg). ? [employer] con sobrepeso u obesidad. ? Scientist, water quality.  Si usted tiene entre 18 y 39 aos, debe medirse la presin arterial cada 3 a 5 aos. Si usted tiene 40 aos o ms, debe medirse la presin arterial [employer]. Debe medirse la presin arterial dos veces: una vez cuando est en un hospital o una clnica y la otra vez cuando est en otro sitio. Registre el promedio de [employer]. Para controlar su presin arterial cuando no est en un hospital o [place], puede usar lo siguiente: ? Jorje Guild automtica para medir la presin arterial en una farmacia. ? Un monitor para medir la presin arterial en el hogar.  Si tiene entre 10 y 85 aos, consulte a su mdico si debe tomar aspirina para prevenir el ictus.  Realcese exmenes de deteccin de la diabetes con regularidad. Esto incluye la toma de [place] de sangre para controlar el nivel de azcar en la sangre durante el [place]. ? Si tiene un peso normal y un bajo riesgo de padecer diabetes, realcese este anlisis cada tres aos despus de los 45aos. ? Si tiene sobrepeso y un alto riesgo de padecer diabetes, considere someterse a este anlisis antes o con mayor frecuencia. PREVENCIN DE INFECCIONES HepatitisB  Si tiene un riesgo ms alto de Museum/gallery curator hepatitis B, debe someterse a un examen de deteccin de este virus. Se considera que tiene un alto riesgo de contraer hepatitis B si: ? Naci en un pas donde la hepatitis B es frecuente. Pregntele a su mdico qu pases son considerados de Public affairs consultant. ? Sus padres nacieron en un pas de  alto riesgo y usted no recibi una vacuna que lo proteja contra la hepatitis B (vacuna contra la hepatitis B). ? [place]. ? [place] agujas para inyectarse drogas. ? Vive con alguien que tiene hepatitis B. ? Ha tenido sexo con alguien que tiene hepatitis B. ? Recibe tratamiento de hemodilisis. ? Toma ciertos medicamentos para el cncer, trasplante de rganos y afecciones autoinmunitarias. Hepatitis C  Se recomienda un anlisis de [place] para: ? [employer] 1945 y 1965. ? Todas las personas que tengan un riesgo de haber contrado hepatitis C. Enfermedades de transmisin sexual (ETS).  Debe realizarse pruebas de deteccin de enfermedades de transmisin sexual (ETS), incluidas gonorrea y clamidia si: ? Es sexualmente activo y es menor de 47QQV. ? Es mayor de 24aos, y Investment banker, operational informa que corre riesgo de tener este tipo de infecciones. ?  La actividad sexual ha cambiado desde que le hicieron la ltima prueba de deteccin y tiene un riesgo mayor de Best boy clamidia o Radio broadcast assistant. Pregntele al mdico si usted tiene riesgo.  Si no tiene el VIH, pero corre riesgo de infectarse por el virus, se recomienda tomar diariamente un medicamento recetado para evitar la infeccin. Esto se conoce como profilaxis previa a la exposicin. Se considera que est en riesgo si: ? Es [place] sexualmente y no [place] preservativos habitualmente o no conoce el estado del VIH de sus Advertising copywriter. ? Se inyecta drogas. ? Es [place] sexualmente con Ardelia Mems pareja que tiene VIH. Consulte a su mdico para saber si tiene un alto riesgo de infectarse por el VIH. Si opta por comenzar la profilaxis previa a la exposicin, primero debe realizarse anlisis de deteccin del VIH. Luego, le harn anlisis cada 43mses mientras est tomando los medicamentos para la profilaxis previa a la exposicin. [facility] Si es premenopusica y puede quedar [place] solicite a su mdico asesoramiento previo a la  concepcin.  Si puede quedar embarazada, tome 400 a [id number](mcg) de cido fAnheuser-Busch  Si desea evitar el embarazo, hable con su mdico sobre el control de la natalidad (anticoncepcin). OSTEOPOROSIS Y MENOPAUSIA  La osteoporosis es una enfermedad en la que los huesos pierden los minerales y la fuerza por el avance de la edad. El resultado pueden ser fracturas graves en los hAsbury El riesgo de osteoporosis puede identificarse con uArdelia Memsprueba de densidad sea.  Si tiene 65aos o ms, o si est en riesgo de sufrir osteoporosis y fracturas, pregunte a su mdico si debe someterse a exmenes.  Consulte a su mdico si debe tomar un suplemento de calcio o de vitamina D para reducir el riesgo de osteoporosis.  La menopausia puede presentar ciertos sntomas fsicos y rGaffer  La terapia de reemplazo hormonal puede reducir algunos de estos sntomas y rGaffer Consulte a su mdico para saber si la terapia de reemplazo hormonal es conveniente para usted. INSTRUCCIONES PARA EL CUIDADO EN EL HOGAR  Realcese los estudios de rutina de la salud, dentales y de lPublic librarian  [place]  No consuma ningn producto que contenga tabaco, lo que incluye cigarrillos, tabaco de mHigher education careers advisero cPsychologist, sport and exercise  Si est embarazada, no beba alcohol.  Si est amamantando, reduzca el consumo de alcohol y la frecuencia con la que consume.  Si es mujer y no est embarazada limite el consumo de alcohol a no ms de 1 medida por da. Una medida equivale a 12onzas de cerveza, 5onzas de vino o 1onzas de bebidas alcohlicas de alta graduacin.  No consuma drogas.  No comparta agujas.  Solicite ayuda a su mdico si necesita apoyo o informacin para abandonar las drogas.  Informe a su mdico si a menudo se siente deprimido.  Notifique a su mdico si alguna vez ha sido vctima de abuso o si no se siente seguro en su hogar. Esta informacin no tiene cMarine scientist el consejo del mdico. Asegrese de hacerle al mdico cualquier pregunta que tenga. Document Released: 01/06/2011 Document Revised: 02/07/2014 Document Reviewed: 10/21/2014 Elsevier Interactive Patient Education  2[employer]

## 2017-11-02 LAB — CBC WITH DIFFERENTIAL/PLATELET
Basophils Absolute: 0.1 10*3/uL (ref 0.0–0.1)
Basophils Relative: 0.9 % (ref 0.0–3.0)
Eosinophils Absolute: 0.1 10*3/uL (ref 0.0–0.7)
Eosinophils Relative: 2.2 % (ref 0.0–5.0)
HCT: 44.8 % (ref 36.0–46.0)
Hemoglobin: 15 g/dL (ref 12.0–15.0)
Lymphocytes Relative: 30 % (ref 12.0–46.0)
Lymphs Abs: 2 10*3/uL (ref 0.7–4.0)
MCHC: 33.5 g/dL (ref 30.0–36.0)
MCV: 89.7 fl (ref 78.0–100.0)
Monocytes Absolute: 0.6 10*3/uL (ref 0.1–1.0)
Monocytes Relative: 8.7 % (ref 3.0–12.0)
Neutro Abs: 3.9 10*3/uL (ref 1.4–7.7)
Neutrophils Relative %: 58.2 % (ref 43.0–77.0)
Platelets: 207 10*3/uL (ref 150.0–400.0)
RBC: 5 Mil/uL (ref 3.87–5.11)
RDW: 14.3 % (ref 11.5–15.5)
WBC: 6.7 10*3/uL (ref 4.0–10.5)

## 2017-11-02 LAB — COMPREHENSIVE METABOLIC PANEL
ALT: 25 U/L (ref 0–35)
AST: 20 U/L (ref 0–37)
Albumin: 4.4 g/dL (ref 3.5–5.2)
Alkaline Phosphatase: 83 U/L (ref 39–117)
BUN: 25 mg/dL — ABNORMAL HIGH (ref 6–23)
CO2: 33 mEq/L — ABNORMAL HIGH (ref 19–32)
Calcium: 10 mg/dL (ref 8.4–10.5)
Chloride: 104 mEq/L (ref 96–112)
Creatinine, Ser: 0.85 mg/dL (ref 0.40–1.20)
GFR: 73.02 mL/min (ref >60.00)
Glucose, Bld: 92 mg/dL (ref 70–99)
Potassium: 5.1 mEq/L (ref 3.5–5.1)
Sodium: 141 mEq/L (ref 135–145)
Total Bilirubin: 0.5 mg/dL (ref 0.2–1.2)
Total Protein: 6.9 g/dL (ref 6.0–8.3)

## 2017-11-02 LAB — IBC PANEL
Iron: 60 ug/dL (ref 42–145)
Saturation Ratios: 15.6 % — ABNORMAL LOW (ref 20.0–50.0)
Transferrin: 274 mg/dL (ref 212.0–360.0)

## 2017-11-02 LAB — TSH: TSH: 3.16 u[IU]/mL (ref 0.35–4.50)

## 2017-11-02 LAB — LIPID PANEL
Cholesterol: 166 mg/dL (ref 0–200)
HDL: 56.8 mg/dL (ref >39.00)
LDL Cholesterol: 89 mg/dL (ref 0–99)
NonHDL: 109.34
Total CHOL/HDL Ratio: 3
Triglycerides: 101 mg/dL (ref 0.0–149.0)
VLDL: 20.2 mg/dL (ref 0.0–40.0)

## 2017-11-02 LAB — MAGNESIUM: Magnesium: 2.4 mg/dL (ref 1.5–2.5)

## 2017-11-02 LAB — FERRITIN: Ferritin: 18.9 ng/mL (ref 10.0–291.0)

## 2017-11-02 LAB — VITAMIN B12: Vitamin B-12: 1066 pg/mL — ABNORMAL HIGH (ref 211–911)

## 2017-11-02 LAB — VITAMIN D 25 HYDROXY (VIT D DEFICIENCY, FRACTURES): VITD: 51.57 ng/mL (ref 30.00–100.00)

## 2017-11-21 ENCOUNTER — Other Ambulatory Visit: Payer: Self-pay

## 2017-11-21 DIAGNOSIS — D242 Benign neoplasm of left breast: Secondary | ICD-10-CM

## 2017-11-21 DIAGNOSIS — Z09 Encounter for follow-up examination after completed treatment for conditions other than malignant neoplasm: Secondary | ICD-10-CM

## 2017-11-21 NOTE — Addendum Note (Signed)
Addended by: Lesly Rubenstein on: 11/21/2017 09:14 AM   Modules accepted: Orders

## 2017-11-21 NOTE — Progress Notes (Signed)
dia

## 2017-12-21 ENCOUNTER — Encounter: Payer: Self-pay | Admitting: Family Medicine

## 2017-12-21 ENCOUNTER — Telehealth: Payer: Self-pay | Admitting: Family Medicine

## 2017-12-21 NOTE — Telephone Encounter (Signed)
Left message asking pt to call office regarding bone denisty

## 2018-01-11 NOTE — Telephone Encounter (Signed)
Left message asking pt to call office  [facility] bone density next appointment would be 1/14

## 2018-01-29 ENCOUNTER — Ambulatory Visit
Admission: RE | Admit: 2018-01-29 | Discharge: 2018-01-29 | Disposition: A | Discharge status: Home / Self Care | Payer: BLUE CROSS/BLUE SHIELD | Source: Ambulatory Visit | Attending: Surgery | Admitting: Surgery

## 2018-01-29 DIAGNOSIS — D242 Benign neoplasm of left breast: Secondary | ICD-10-CM

## 2018-01-29 DIAGNOSIS — R928 Other abnormal and inconclusive findings on diagnostic imaging of breast: Secondary | ICD-10-CM | POA: Diagnosis not present

## 2018-02-05 ENCOUNTER — Ambulatory Visit: Payer: BLUE CROSS/BLUE SHIELD | Admitting: Surgery

## 2018-02-22 ENCOUNTER — Other Ambulatory Visit: Payer: BLUE CROSS/BLUE SHIELD

## 2018-03-09 NOTE — Telephone Encounter (Signed)
Dr Darnell Level  I have attempted to contact pt to schedule her bone density.  Do you want me to keep trying   See below dates spoke with pt she wil make her own appointment @ [facility] 10/23/[facility] tried calling pt voicemail full 11/21/[facility] sent my chart message spoke with pt she will call me back working at the moment 11/21/rbh Patient is requesting Shirlean Mylar schedule her appointment at [place] on 01/29/18 in the morning or 01/30/18 in the morning.cle left message asking pt to call office 12/12/[facility] left message askign pt to call office 02/08/2018 left message asking pt to call office 2/7/[facility]

## 2018-03-11 NOTE — Telephone Encounter (Signed)
Ok to stop. thanks

## 2018-04-10 ENCOUNTER — Encounter: Payer: Self-pay | Admitting: Radiology

## 2018-07-30 ENCOUNTER — Other Ambulatory Visit: Payer: Self-pay | Admitting: *Deleted

## 2018-07-30 DIAGNOSIS — Z1231 Encounter for screening mammogram for malignant neoplasm of breast: Secondary | ICD-10-CM

## 2018-09-19 ENCOUNTER — Ambulatory Visit: Payer: BLUE CROSS/BLUE SHIELD | Admitting: Surgery

## 2018-10-09 ENCOUNTER — Ambulatory Visit
Admission: RE | Admit: 2018-10-09 | Discharge: 2018-10-09 | Disposition: A | Discharge status: Home / Self Care | Payer: BC Managed Care – PPO | Source: Ambulatory Visit | Attending: Surgery | Admitting: Surgery

## 2018-10-09 DIAGNOSIS — Z1231 Encounter for screening mammogram for malignant neoplasm of breast: Secondary | ICD-10-CM | POA: Diagnosis not present

## 2018-10-10 ENCOUNTER — Telehealth: Payer: Self-pay

## 2018-10-10 NOTE — Telephone Encounter (Signed)
-----   Message from Jules Husbands, MD sent at 10/10/2018 11:18 AM EDT ----- Please let her know that mammo was nml ----- Message ----- From: Interface, Rad Results In Sent: 10/10/2018  10:47 AM EDT To: Jules Husbands, MD

## 2018-10-10 NOTE — Telephone Encounter (Signed)
Per Dr Dahlia Byes mammogram was normal. Patient will need to reschedule her follow up with Dr Dahlia Byes. A message has been left regarding this.

## 2018-10-15 ENCOUNTER — Ambulatory Visit: Payer: BC Managed Care – PPO | Admitting: Surgery

## 2018-11-06 ENCOUNTER — Ambulatory Visit: Payer: Self-pay | Admitting: Family Medicine

## 2018-11-21 ENCOUNTER — Encounter: Payer: Self-pay | Admitting: *Deleted

## 2018-12-05 ENCOUNTER — Other Ambulatory Visit: Payer: Self-pay

## 2018-12-05 ENCOUNTER — Encounter: Payer: Self-pay | Admitting: Family Medicine

## 2018-12-05 ENCOUNTER — Ambulatory Visit (INDEPENDENT_AMBULATORY_CARE_PROVIDER_SITE_OTHER): Payer: BC Managed Care – PPO | Admitting: Family Medicine

## 2018-12-05 VITALS — BP 118/80 | HR 64 | Temp 97.7°F | Ht 63.0 in | Wt 172.1 lb

## 2018-12-05 DIAGNOSIS — M810 Age-related osteoporosis without current pathological fracture: Secondary | ICD-10-CM | POA: Diagnosis not present

## 2018-12-05 DIAGNOSIS — M50121 Cervical disc disorder at C4-C5 level with radiculopathy: Secondary | ICD-10-CM

## 2018-12-05 DIAGNOSIS — Z1159 Encounter for screening for other viral diseases: Secondary | ICD-10-CM

## 2018-12-05 DIAGNOSIS — Z9884 Bariatric surgery status: Secondary | ICD-10-CM | POA: Diagnosis not present

## 2018-12-05 DIAGNOSIS — M5416 Radiculopathy, lumbar region: Secondary | ICD-10-CM | POA: Diagnosis not present

## 2018-12-05 DIAGNOSIS — E669 Obesity, unspecified: Secondary | ICD-10-CM

## 2018-12-05 DIAGNOSIS — Z0000 Encounter for general adult medical examination without abnormal findings: Secondary | ICD-10-CM

## 2018-12-05 DIAGNOSIS — E611 Iron deficiency: Secondary | ICD-10-CM

## 2018-12-05 MED ORDER — FERROUS SULFATE 325 (65 FE) MG PO TABS: 325.0000 mg | ORAL_TABLET | ORAL | Status: AC

## 2018-12-05 NOTE — Progress Notes (Signed)
This visit was conducted in person.  BP 118/80 (BP Location: Left Arm, Patient Position: Sitting, Cuff Size: Normal)   Pulse 64   Temp 97.7 F (36.5 C) (Temporal)   Ht 5\' 3"  (1.6 m)   Wt 172 lb 1 oz (78 kg)   SpO2 97%   BMI 30.48 kg/m    CC: CPE Subjective:    Patient ID: Melinda Hall, female    DOB: [birth year], 59 y.o.   MRN: [id number]  HPI: Melinda Hall is a 58 y.o. female presenting on 12/05/2018 for Annual Exam   Mother is coming - was able to get out of [place] through [place].   Has lost 20 lbs through intermittent fasting - 18 hrs.  Last meal at 12:00pm today.   S/p cervical neck surgery ([facility]) 2018.   Notes progression of lateral left foot numbness from 2 lateral toes to medial sole into heels. Also finds cramping pain up foot into calf/popliteal area. Known L4/5 disc protrusion which may be affecting left L5 nerve by lumbar MRI (07/2016).   Preventative: Colonoscopy 11/2015 - TA rpt 5 yrs Allen Norris)  Well woman with OBGYN (Dr Kennon Rounds) completed 05/2017.S/p bilateral oophorectomy for dermoid cysts age 61 yo. Never used HRT.  Mammo -07/2017 - abnormal spots L breast - initial spot not biopsied - rec 6 mo f/u. 2nd spot returned intraductal papilloma - followed by gen surgery. Rpt mammo 10/2018 WNL DEXA 11/2015: T -2.6 spine osteoporosis, saw endo Dr Graceann Congress, rec against treatment at this time. rec rpt 12/2017 - wants after 02/2019. Flu shotyearly Tdap 2018 Shingrix -  Seat belt use discussed Sunscreen use discussed. No changing moles on skin.  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  Diet: good water, fruits/vegetables daily     Relevant past medical, surgical, family and social history reviewed and updated as indicated. Interim  medical history since our last visit reviewed. Allergies and medications reviewed and updated. Outpatient Medications Prior to Visit  Medication Sig Dispense Refill  . calcium carbonate (OS-CAL) 600 MG TABS tablet Take 600 mg by mouth daily with breakfast.    . Cholecalciferol (VITAMIN D3) 1000 units CAPS Take 3 capsules (3,000 Units total) by mouth daily.    . Prasterone (INTRAROSA) 6.5 MG INST Place 1 suppository vaginally daily. 30 each 2  . vitamin B-12 (CYANOCOBALAMIN) 1000 MCG tablet Take 1 tablet (1,000 mcg total) by mouth daily.    . ferrous sulfate 325 (65 FE) MG tablet Take 1 tablet (325 mg total) by mouth daily with breakfast.     Facility-Administered Medications Prior to Visit  Medication Dose Route Frequency Provider Last Rate Last Dose  . bupivacaine liposome (EXPAREL) 1.3 % injection 266 mg  20 mL Infiltration Once Pabon, Diego F, MD         Per HPI unless specifically indicated in ROS section below Review of Systems  Constitutional: Negative for activity change, appetite change, chills, fatigue, fever and unexpected weight change.  HENT: Negative for hearing loss.   Eyes: Negative for visual disturbance.  Respiratory: Negative for cough, chest tightness, shortness of breath and wheezing.   Cardiovascular: Negative for chest pain, palpitations and leg swelling.  Gastrointestinal: Positive for constipation. Negative for abdominal distention, abdominal pain, blood in stool, diarrhea, nausea and vomiting.  Genitourinary: Negative for difficulty urinating and hematuria.  Musculoskeletal: Negative for arthralgias, myalgias and neck pain.  Skin: Negative for rash.  Neurological: Negative for dizziness, seizures, syncope and headaches.  Hematological: Negative for adenopathy. Does not bruise/bleed easily.  Psychiatric/Behavioral: Negative for dysphoric mood. The patient is not nervous/anxious.    Objective:    BP 118/80 (BP Location: Left Arm, Patient Position: Sitting, Cuff  Size: Normal)   Pulse 64   Temp 97.7 F (36.5 C) (Temporal)   Ht 5\' 3"  (1.6 m)   Wt 172 lb 1 oz (78 kg)   SpO2 97%   BMI 30.48 kg/m   Wt Readings from Last 3 Encounters:  12/05/18 172 lb 1 oz (78 kg)  11/01/17 190 lb 12 oz (86.5 kg)  08/21/17 190 lb (86.2 kg)    Physical Exam Vitals signs and nursing note reviewed.  Constitutional:      General: She is not in acute distress.    Appearance: Normal appearance. She is well-developed. She is not ill-appearing.  HENT:     Head: Normocephalic and atraumatic.     Right Ear: Hearing, tympanic membrane, ear canal and external ear normal.     Left Ear: Hearing, tympanic membrane, ear canal and external ear normal.     Nose: Nose normal.     Mouth/Throat:     Pharynx: Uvula midline. No oropharyngeal exudate or posterior oropharyngeal erythema.  Eyes:     General: No scleral icterus.    Conjunctiva/sclera: Conjunctivae normal.     Pupils: Pupils are equal, round, and reactive to light.  Neck:     Musculoskeletal: Normal range of motion and neck supple.  Cardiovascular:     Rate and Rhythm: Normal rate and regular rhythm.     Pulses:          Radial pulses are 2+ on the right side and 2+ on the left side.     Heart sounds: Normal heart sounds. No murmur.  Pulmonary:     Effort: Pulmonary effort is normal. No respiratory distress.     Breath sounds: Normal breath sounds. No wheezing or rales.  Abdominal:     General: Bowel sounds are normal. There is no distension.     Palpations: Abdomen is soft. There is no mass.     Tenderness: There is no abdominal tenderness. There is no guarding or rebound.  Musculoskeletal: Normal range of motion.     Right lower leg: No edema.     Left lower leg: No edema.     Comments: Diminished sensation to light touch, monofilament testing, temperature discrimination along L 4th/5th toes of foot dorsally and plantar into heel  Lymphadenopathy:     Cervical: No cervical adenopathy.  Skin:    General:  Skin is warm and dry.     Findings: No rash.  Neurological:     Mental Status: She is alert and oriented to person, place, and time.     Sensory: Sensory deficit present.     Motor: Weakness present.     Comments:  CN grossly intact, station and gait intact 5/5 strength RLE 4/5 strength LLE  Psychiatric:        Behavior: Behavior normal.        Thought Content: Thought content normal.        Judgment: Judgment normal.       Results for orders placed or performed in visit on 11/01/17  Lipid panel  Result Value Ref Range   Cholesterol 166 0 -  200 mg/dL   Triglycerides 101.0 0.0 - 149.0 mg/dL   HDL 56.80 >39.00 mg/dL   VLDL 20.2 0.0 - 40.0 mg/dL   LDL Cholesterol 89 0 - 99 mg/dL   Total CHOL/HDL Ratio 3    NonHDL 109.34   Comprehensive metabolic panel  Result Value Ref Range   Sodium 141 135 - 145 mEq/L   Potassium 5.1 3.5 - 5.1 mEq/L   Chloride 104 96 - 112 mEq/L   CO2 33 (H) 19 - 32 mEq/L   Glucose, Bld 92 70 - 99 mg/dL   BUN 25 (H) 6 - 23 mg/dL   Creatinine, Ser 0.85 0.40 - 1.20 mg/dL   Total Bilirubin 0.5 0.2 - 1.2 mg/dL   Alkaline Phosphatase 83 39 - 117 U/L   AST 20 0 - 37 U/L   ALT 25 0 - 35 U/L   Total Protein 6.9 6.0 - 8.3 g/dL   Albumin 4.4 3.5 - 5.2 g/dL   Calcium 10.0 8.4 - 10.5 mg/dL   GFR 73.02 >60.00 mL/min  TSH  Result Value Ref Range   TSH 3.16 0.35 - 4.50 uIU/mL  CBC with Differential/Platelet  Result Value Ref Range   WBC 6.7 4.0 - 10.5 K/uL   RBC 5.00 3.87 - 5.11 Mil/uL   Hemoglobin 15.0 12.0 - 15.0 g/dL   HCT 44.8 36.0 - 46.0 %   MCV 89.7 78.0 - 100.0 fl   MCHC 33.5 30.0 - 36.0 g/dL   RDW 14.3 11.5 - 15.5 %   Platelets 207.0 150.0 - 400.0 K/uL   Neutrophils Relative % 58.2 43.0 - 77.0 %   Lymphocytes Relative 30.0 12.0 - 46.0 %   Monocytes Relative 8.7 3.0 - 12.0 %   Eosinophils Relative 2.2 0.0 - 5.0 %   Basophils Relative 0.9 0.0 - 3.0 %   Neutro Abs 3.9 1.4 - 7.7 K/uL   Lymphs Abs 2.0 0.7 - 4.0 K/uL   Monocytes Absolute 0.6 0.1 -  1.0 K/uL   Eosinophils Absolute 0.1 0.0 - 0.7 K/uL   Basophils Absolute 0.1 0.0 - 0.1 K/uL  Vitamin B12  Result Value Ref Range   Vitamin B-12 1,066 (H) 211 - 911 pg/mL  Ferritin  Result Value Ref Range   Ferritin 18.9 10.0 - 291.0 ng/mL  IBC panel  Result Value Ref Range   Iron 60 42 - 145 ug/dL   Transferrin 274.0 212.0 - 360.0 mg/dL   Saturation Ratios 15.6 (L) 20.0 - 50.0 %  VITAMIN D 25 Hydroxy (Vit-D Deficiency, Fractures)  Result Value Ref Range   VITD 51.57 30.00 - 100.00 ng/mL  Magnesium  Result Value Ref Range   Magnesium 2.4 1.5 - 2.5 mg/dL   Assessment & Plan:   Problem List Items Addressed This Visit    Status post gastric bypass for obesity    Update labs. Compliant with b12, D3, iron, calcium.       Relevant Orders   Lipid panel   Comprehensive metabolic panel   TSH   CBC with Differential   Vitamin B12   Ferritin   IBC panel   Folate   vit d   Vitamin B1   Osteoporosis    Update DEXA after 02/2019 per pt request. H/o surgical menopause age 19yo. She continues vit D 3000 IU and calcium 600mg  daily.      Relevant Orders   DG Bone Density   Obesity, Class I, BMI 30-34.9    Congratulated on weight loss to date - achieved  through intermittent fasting. She is motivated to continue healthy diet and lifestyle changes to affect sustainable weight loss.       Left lumbar radiculopathy    Progressive weakness/paresthesia to L lateral foot and sole along L5/S1 dermatome. MRI from 2018 with compression of L L5 nerve root. Will refer back to neurosurgery for further evaluation per pt request - weakness is affecting her ability to exercise.       Relevant Orders   Ambulatory referral to Neurosurgery   Iron deficiency    Update iron levels. She continues ferrous sulfate MWF.       Healthcare maintenance - Primary    Preventative protocols reviewed and updated unless pt declined. Discussed healthy diet and lifestyle.       Disorder of intervertebral disc  at C4-C5 level with radiculopathy    Other Visit Diagnoses    Need for hepatitis C screening test       Relevant Orders   Hepatitis C Antibody       Meds ordered this encounter  Medications  . ferrous sulfate 325 (65 FE) MG tablet    Sig: Take 1 tablet (325 mg total) by mouth every Monday, Wednesday, and Friday.   Orders Placed This Encounter  Procedures  . DG Bone Density    Standing Status:   Future    Standing Expiration Date:   02/04/2020    Order Specific Question:   Reason for Exam (SYMPTOM  OR DIAGNOSIS REQUIRED)    Answer:   osteoporosis    Order Specific Question:   Is the patient pregnant?    Answer:   No    Order Specific Question:   Preferred imaging location?    Answer:   [facility]  . Hepatitis C Antibody  . Lipid panel  . Comprehensive metabolic panel  . TSH  . CBC with Differential  . Vitamin B12  . Ferritin  . IBC panel  . Folate  . vit d  . Vitamin B1  . Ambulatory referral to Neurosurgery    Referral Priority:   Routine    Referral Type:   Surgical    Referral Reason:   Specialty Services Required    Requested Specialty:   Neurosurgery    Number of Visits Requested:   1    Patient instructions: Labs today Haremos cita para densitometria en enero. Haremos cita para neurocirugia en enero  Felicitaciones con el peso!  regresar en 1 ao para proximo examen fisico.   Follow up plan: Return in about 1 year (around 12/05/2019) for annual exam, prior fasting for blood work, medicare wellness visit.  [name], MD

## 2018-12-05 NOTE — Assessment & Plan Note (Signed)
Congratulated on weight loss to date - achieved through intermittent fasting. She is motivated to continue healthy diet and lifestyle changes to affect sustainable weight loss.

## 2018-12-05 NOTE — Patient Instructions (Addendum)
Labs today Haremos cita para densitometria en enero. Haremos cita para neurocirugia en enero  Felicitaciones con el peso!  regresar en 1 ao para proximo examen fisico.   [place] Maintenance for Postmenopausal Women La menopausia es un proceso normal en el cual la capacidad de quedar embarazada llega a su fin. Este proceso ocurre lentamente a lo largo de un perodo de muchos meses o aos; por lo general, entre los 79 y los 72aos. La menopausia es completa cuando no se ha tenido el perodo menstrual por 62meses. Es importante hablar con el mdico sobre algunas de las enfermedades ms comunes que afectan a las mujeres despus de la menopausia (mujeres posmenopusicas). Estas incluyen la enfermedad cardaca, el cncer y la prdida sea (osteoporosis). Adoptar un estilo de vida saludable y recibir atencin preventiva pueden ayudar a promover la salud y Musician. Las medidas que tome tambin pueden reducir las probabilidades de Actor algunas de estas enfermedades frecuentes. Qu debo saber acerca de la menopausia? Durante la menopausia, puede tener una serie de sntomas, por ejemplo:  Acaloramiento. Estos pueden ser moderados o intensos.  Sudoracin nocturna.  Disminucin del deseo sexual.  Cambios en el estado de nimo.  Dolores de [place].  Cansancio.  Irritabilidad.  Problemas de memoria.  Insomnio. Tratar o no estos sntomas es una decisin que se toma con el mdico. Necesito terapia de reemplazo hormonal?  La terapia de reemplazo hormonal es eficaz para tratar los sntomas causados por la menopausia, como los acaloramientos y las sudoraciones nocturnas.  La reposicin hormonal conlleva ciertos riesgos, especialmente a medida que una mujer envejece. Si est pensando en usar estrgeno o estrgeno con progestina, analice los beneficios y los riesgos con el mdico. Cul es mi riesgo de sufrir enfermedad cardaca y  accidente cerebrovascular? A medida que se envejece, aumenta el riesgo de enfermedad cardaca, infarto de miocardio y accidente cerebrovascular. Una de las causas puede ser un cambio en las hormonas del cuerpo durante la menopausia. Esto puede afectar la forma en que el organismo procesa las [place], los triglicridos y el colesterol de su dieta. El infarto de miocardio y el accidente cerebrovascular son emergencias mdicas. Hay muchas cosas que se pueden hacer para ayudar a prevenir la enfermedad cardaca y el accidente cerebrovascular. Contrlese la presin arterial  La hipertensin arterial causa enfermedades cardacas y [place] el riesgo de accidente cerebrovascular. Es ms probable que esto se manifieste en las personas que tienen lecturas de presin arterial alta, tienen ascendencia africana o tienen sobrepeso.  Hgase controlar la presin arterial: ? Cada 3 a 5 aos si tiene entre 18 y 43 aos. ? Todos los aos si es mayor de [place]. Consuma una dieta saludable   Consuma una dieta que incluya muchas verduras, frutas, productos lcteos con bajo contenido de [place] y Advertising account planner.  No consuma muchos alimentos ricos en grasas slidas, azcares agregados o sodio. Haga ejercicio con regularidad Haga ejercicio con regularidad. Esta es una de las prcticas ms importantes que puede hacer por su salud. La mayora de los adultos deben seguir estas pautas:  Intente realizar al menos 195minutos de actividad fsica por semana. El ejercicio debe aumentar la frecuencia cardaca y Nature conservation officer transpirar (ejercicio de intensidad moderada).  Intente hacer ejercicios de elongacin por lo menos dos veces por semana. Agrguelos al plan de ejercicio de intensidad moderada.  Pasar menos tiempo sentados. Incluso la actividad fsica ligera puede ser beneficiosa. Otros consejos  Trabaje con su mdico para alcanzar o  mantener un peso saludable.  No consuma ningn producto que contenga nicotina o tabaco, como  cigarrillos, cigarrillos electrnicos y tabaco de Higher education careers adviser. Si necesita ayuda para dejar de fumar, consulte al mdico.  Conozca sus cifras. Pdale al mdico que le controle el colesterol y el nivel sanguneo de azcar en la sangre (glucosa). Siga hacindose anlisis de [employer] se lo haya indicado el mdico. Necesito realizarme pruebas de deteccin del cncer? Segn su historia clnica y sus antecedentes familiares, es posible que deba realizarse pruebas de deteccin del cncer en diferentes etapas de la vida. Esto puede incluir pruebas de deteccin de lo siguiente:  Cncer de mama.  Cncer de cuello uterino.  Cncer de pulmn.  Cncer colorrectal. Cul es mi riesgo de tener osteoporosis? Despus de la menopausia, puede correr un riesgo ms alto de tener osteoporosis. La osteoporosis es una afeccin en la cual la destruccin de la masa sea ocurre con mayor rapidez que su formacin. Para ayudar a prevenir esta afeccin o las fracturas seas que pueden ocurrir a causa de [place], usted puede tomar las siguientes medidas:  Si tiene entre 19 y 50aos, tome como mnimo 1000mg  de calcio y 600mg  de vitaminaD por Training and development officer.  Si es mayor de 50aos pero menor de 70aos, tome como mnimo 1200mg  de calcio y 600mg  de vitaminaD por Training and development officer.  Si es mayor de 70aos, tome como mnimo 1200mg  de calcio y 800mg  de vitaminaD por Training and development officer. Fumar y beber alcohol en exceso aumentan el riesgo de osteoporosis. Consuma alimentos ricos en calcio y vitaminaD, y haga ejercicios con soporte de peso varias veces a la semana, como se lo haya indicado el mdico. De qu manera la menopausia afecta mi salud mental? La depresin puede presentarse a cualquier edad, pero es ms frecuente a medida que una persona envejece. Los sntomas comunes de depresin incluyen lo siguiente:  Desnimo o tristeza.  Cambios en los patrones de sueo.  Cambios en el apetito o en los hbitos de alimentacin.  Sensacin de falta general de  motivacin o placer al [employer] actividades que sola disfrutar.  Crisis frecuentes de llanto. Hable con el mdico si cree que tiene depresin. Instrucciones generales Visite a su mdico para hacerse exmenes de bienestar peridicos y aplicarse vacunas. Puede incluir:  Programar exmenes peridicos dentales, de la salud y de Public librarian.  Recibir y [employer]. Estos incluyen los siguientes: ? Human resources officer. Aplquese esta vacuna todos los aos antes de que comience la temporada de gripe. ? Vacuna contra la neumona. ? Vacuna contra el herpes. ? Vacuna contra el ttanos, la difteria y la tos Dyann Ruddle (Tdap). El mdico tambin puede recomendarle que se aplique otras vacunas. Notifique a su mdico si alguna vez ha sido vctima de abuso o si no se siente seguro en su hogar. Resumen  La menopausia es un proceso normal en el cual la capacidad de quedar embarazada llega a su fin.  Esta condicin causa acaloramientos, sudoraciones nocturnas, disminucin del inters en el sexo, cambios en el estado de nimo, dolores de [place] o falta de sueo.  El tratamiento de esta afeccin puede incluir una terapia de reemplazo hormonal.  Tome medidas para mantenerse 36, entre ellas, hacer ejercicio con regularidad, seguir una dieta saludable, controlar su peso y medirse la presin arterial y los niveles de Dispensing optician.  Hgase pruebas para Film/video editor y depresin. Asegrese de estar al da con todas las vacunas. Esta informacin no tiene Marine scientist el consejo del mdico.  Asegrese de hacerle al mdico cualquier pregunta que tenga. Document Released: 11/07/2012 Document Revised: 02/07/2018 Document Reviewed: 02/07/2018 Elsevier Patient Education  2020 [employer].

## 2018-12-05 NOTE — Assessment & Plan Note (Signed)
Progressive weakness/paresthesia to L lateral foot and sole along L5/S1 dermatome. MRI from 2018 with compression of L L5 nerve root. Will refer back to neurosurgery for further evaluation per pt request - weakness is affecting her ability to exercise.

## 2018-12-05 NOTE — Assessment & Plan Note (Addendum)
Update DEXA after 02/2019 per pt request. H/o surgical menopause age 58yo. She continues vit D 3000 IU and calcium 600mg  daily.

## 2018-12-05 NOTE — Assessment & Plan Note (Signed)
Update iron levels. She continues ferrous sulfate MWF.

## 2018-12-05 NOTE — Assessment & Plan Note (Signed)
Update labs. Compliant with b12, D3, iron, calcium.

## 2018-12-05 NOTE — Assessment & Plan Note (Signed)
Preventative protocols reviewed and updated unless pt declined. Discussed healthy diet and lifestyle.  

## 2018-12-06 LAB — CBC WITH DIFFERENTIAL/PLATELET
Basophils Absolute: 0.1 10*3/uL (ref 0.0–0.1)
Basophils Relative: 1.1 % (ref 0.0–3.0)
Eosinophils Absolute: 0.1 10*3/uL (ref 0.0–0.7)
Eosinophils Relative: 1.3 % (ref 0.0–5.0)
HCT: 42.8 % (ref 36.0–46.0)
Hemoglobin: 14.1 g/dL (ref 12.0–15.0)
Lymphocytes Relative: 25 % (ref 12.0–46.0)
Lymphs Abs: 1.8 10*3/uL (ref 0.7–4.0)
MCHC: 33 g/dL (ref 30.0–36.0)
MCV: 94 fl (ref 78.0–100.0)
Monocytes Absolute: 0.5 10*3/uL (ref 0.1–1.0)
Monocytes Relative: 7.4 % (ref 3.0–12.0)
Neutro Abs: 4.7 10*3/uL (ref 1.4–7.7)
Neutrophils Relative %: 65.2 % (ref 43.0–77.0)
Platelets: 212 10*3/uL (ref 150.0–400.0)
RBC: 4.56 Mil/uL (ref 3.87–5.11)
RDW: 13.3 % (ref 11.5–15.5)
WBC: 7.2 10*3/uL (ref 4.0–10.5)

## 2018-12-06 LAB — COMPREHENSIVE METABOLIC PANEL
ALT: 23 U/L (ref 0–35)
AST: 20 U/L (ref 0–37)
Albumin: 4.4 g/dL (ref 3.5–5.2)
Alkaline Phosphatase: 86 U/L (ref 39–117)
BUN: 22 mg/dL (ref 6–23)
CO2: 26 mEq/L (ref 19–32)
Calcium: 9.6 mg/dL (ref 8.4–10.5)
Chloride: 105 mEq/L (ref 96–112)
Creatinine, Ser: 0.79 mg/dL (ref 0.40–1.20)
GFR: 74.48 mL/min (ref >60.00)
Glucose, Bld: 83 mg/dL (ref 70–99)
Potassium: 4.8 mEq/L (ref 3.5–5.1)
Sodium: 140 mEq/L (ref 135–145)
Total Bilirubin: 0.8 mg/dL (ref 0.2–1.2)
Total Protein: 6.5 g/dL (ref 6.0–8.3)

## 2018-12-06 LAB — LIPID PANEL
Cholesterol: 147 mg/dL (ref 0–200)
HDL: 48 mg/dL (ref >39.00)
LDL Cholesterol: 76 mg/dL (ref 0–99)
NonHDL: 99.47
Total CHOL/HDL Ratio: 3
Triglycerides: 117 mg/dL (ref 0.0–149.0)
VLDL: 23.4 mg/dL (ref 0.0–40.0)

## 2018-12-06 LAB — HEPATITIS C ANTIBODY
Hepatitis C Ab: NONREACTIVE
SIGNAL TO CUT-OFF: 0.01 (ref <1.00)

## 2018-12-06 LAB — IBC PANEL
Iron: 76 ug/dL (ref 42–145)
Saturation Ratios: 25.1 % (ref 20.0–50.0)
Transferrin: 216 mg/dL (ref 212.0–360.0)

## 2018-12-06 LAB — VITAMIN B12: Vitamin B-12: 482 pg/mL (ref 211–911)

## 2018-12-06 LAB — FERRITIN: Ferritin: 48.7 ng/mL (ref 10.0–291.0)

## 2018-12-06 LAB — TSH: TSH: 2.46 u[IU]/mL (ref 0.35–4.50)

## 2018-12-06 LAB — VITAMIN D 25 HYDROXY (VIT D DEFICIENCY, FRACTURES): VITD: 53.83 ng/mL (ref 30.00–100.00)

## 2018-12-06 LAB — FOLATE: Folate: 12.1 ng/mL (ref >5.9)

## 2018-12-09 LAB — VITAMIN B1: Vitamin B1 (Thiamine): 16 nmol/L (ref 8–30)

## 2019-01-16 IMAGING — MG DIGITAL DIAGNOSTIC UNILATERAL LEFT MAMMOGRAM WITH TOMO AND CAD
4 series · 4 of 12 positions shown · non-contrast
Comparison: 05/15/2017 and earlier

ACR Breast Density Category a: The breast tissue is almost entirely
fatty.

CLINICAL DATA: Patient returns after screening study for evaluation
of possible LEFT breast asymmetry.

EXAM:
DIGITAL DIAGNOSTIC LEFT MAMMOGRAM WITH CAD AND TOMO
ULTRASOUND LEFT BREAST

[L CC synth-2D]
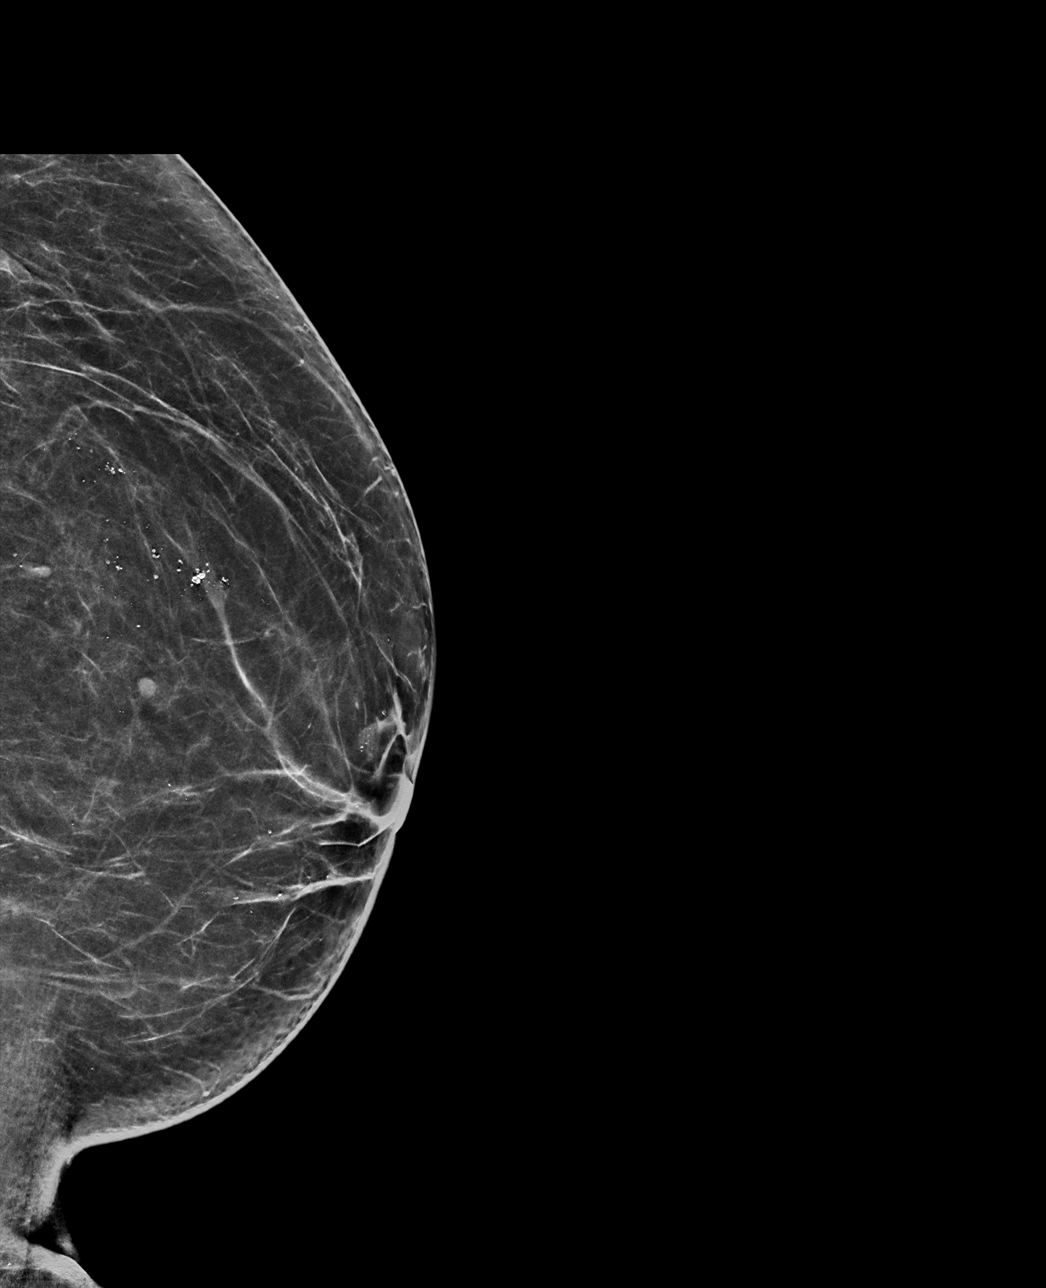

[L MLO synth-2D]
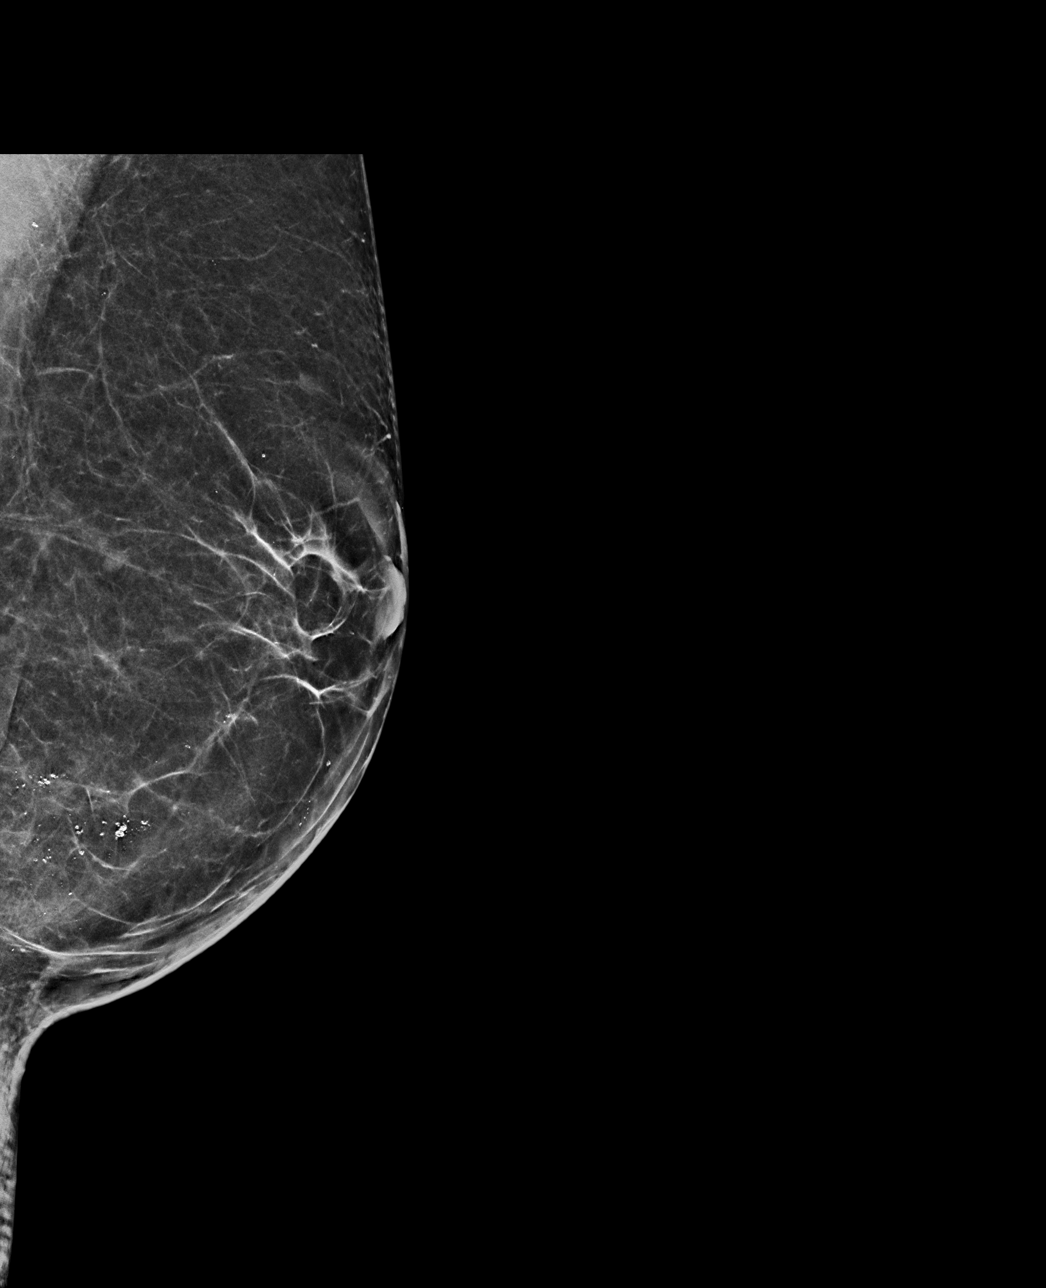

[L MLO tomo · tomo slice 34/67.0]
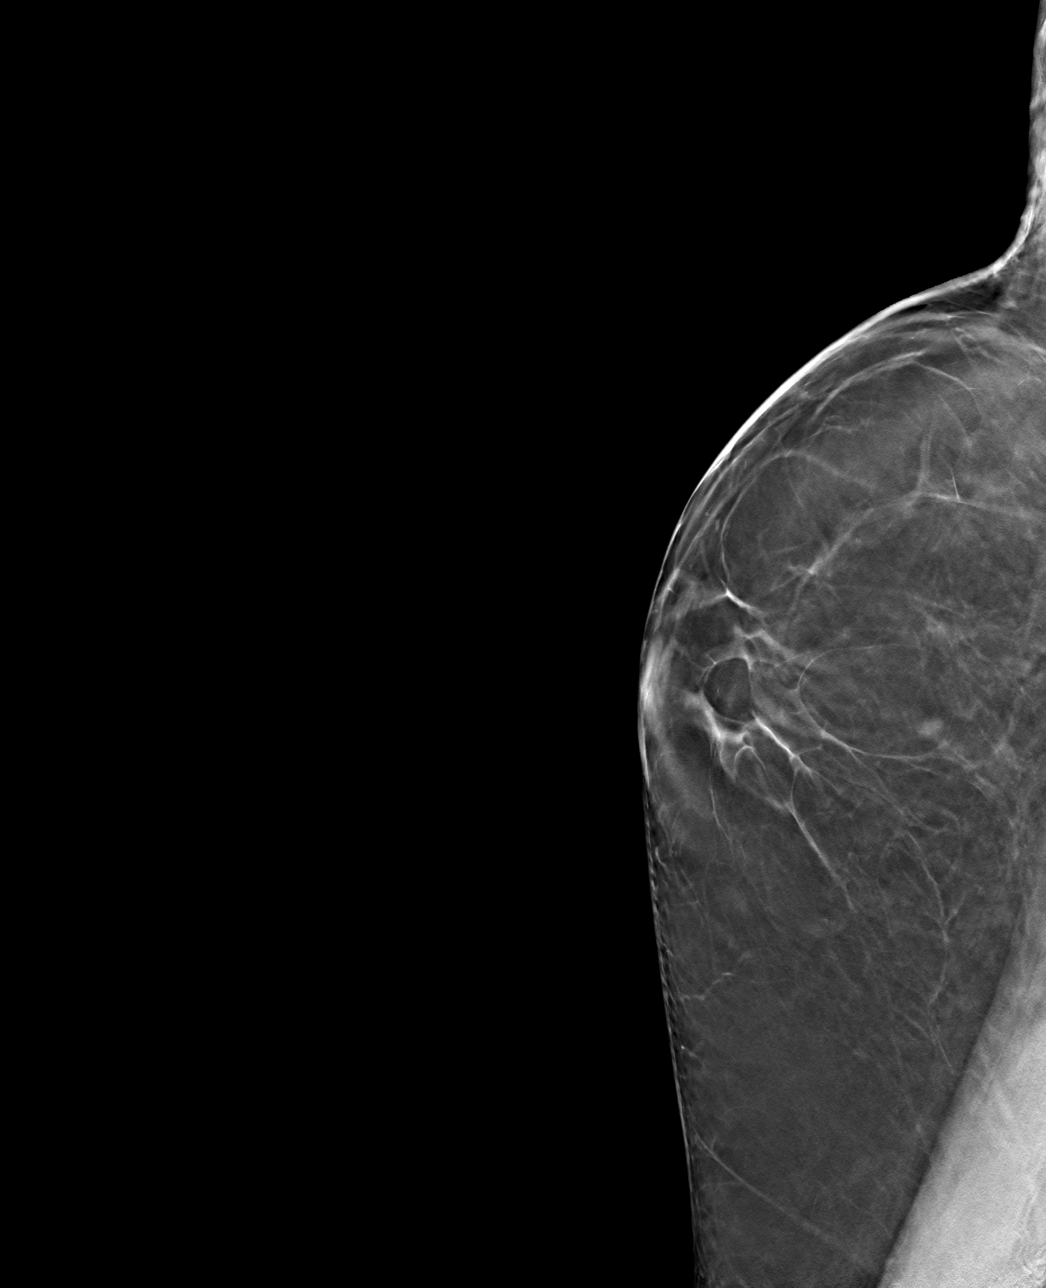

[L CC tomo · tomo slice 35/68.0]
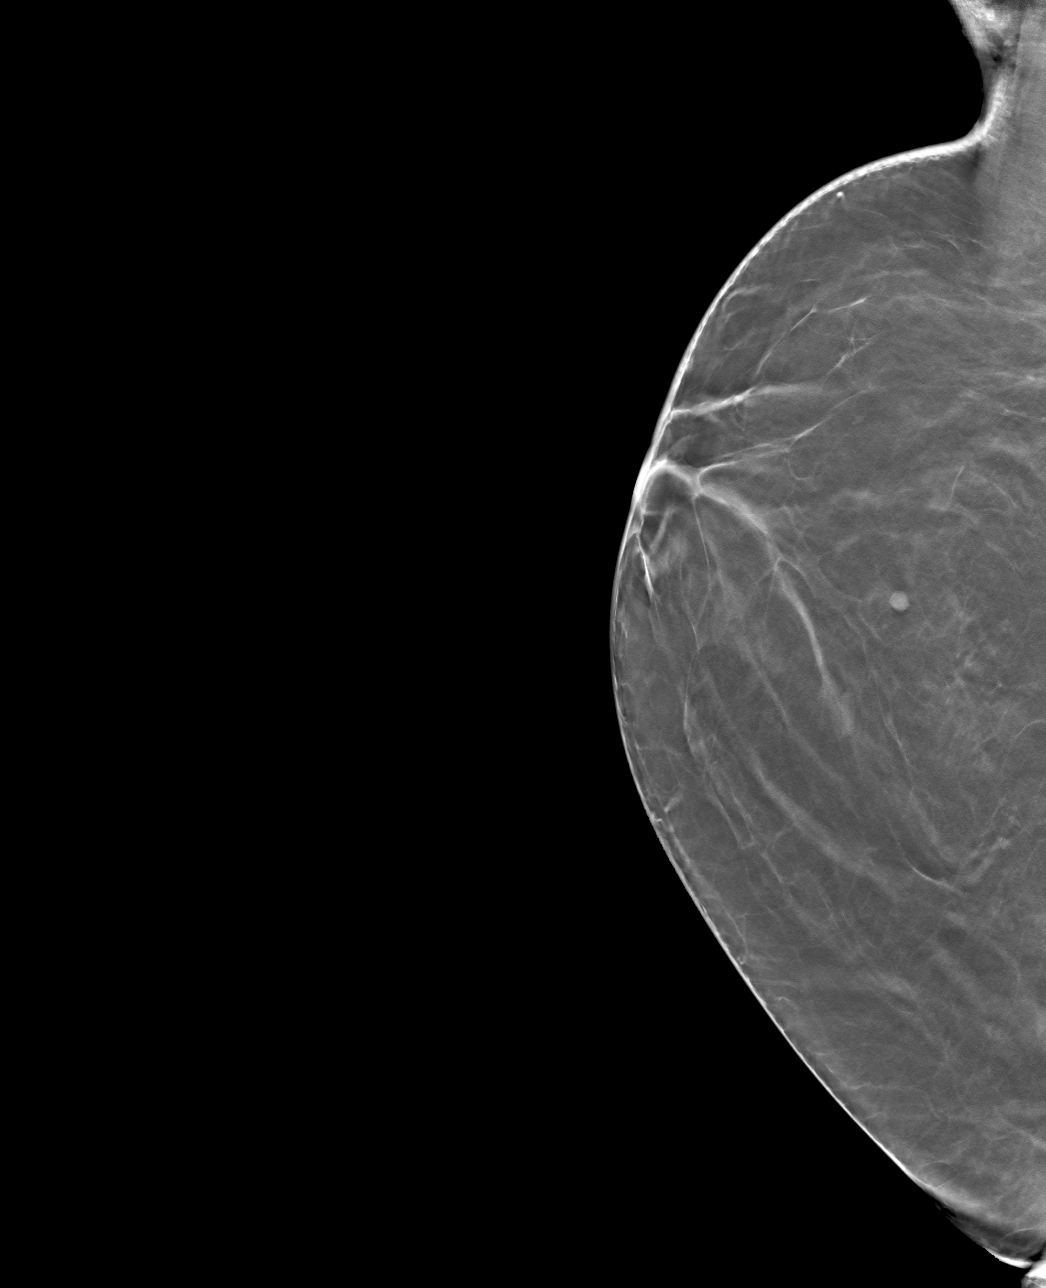

[4 of 12 positions shown; findings below may reference images not displayed]

FINDINGS: Additional 2-D and 3-D images are performed. These views confirm
presence of an oval circumscribed mass in the central posterior
UPPER OUTER QUADRANT of the LEFT breast.

Mammographic images were processed with CAD.

On physical exam, the patient shows me a small superficial mass in
the 1 o'clock location of the LEFT breast from which she is
occasionally able to express fluid. I palpate no other abnormality.

Targeted ultrasound is performed, showing a small intradermal mass
in the 1 o'clock location of the LEFT breast corresponding to the
area of patient's concern. This is a 5 millimeter hypoechoic
intradermal inclusion cyst.

Within the 2 o'clock location of the LEFT breast 1 centimeter from
the nipple, there is a parallel oval hypoechoic mass which measures
0.5 x 0.4 x 0.3 centimeters. There are internal echoes but no
internal blood flow. Evaluation of the LEFT axilla is negative for
adenopathy.
IMPRESSION: Persistent abnormality represents a small indeterminate
circumscribed mass in the 2 o'clock location LEFT breast 1
centimeter from the nipple. Biopsy is indicated to exclude
malignancy.

Small, benign intradermal inclusion cyst in the 1 o'clock location
of the LEFT breast without mammographic correlate.

RECOMMENDATION:
Ultrasound-guided core biopsy of mass in the 2 o'clock location of
the LEFT breast. Pain

I have discussed the findings and recommendations with the patient.
Results were also provided in writing at the conclusion of the
visit. If applicable, a reminder letter will be sent to the patient
regarding the next appointment.

BI-RADS CATEGORY  4: Suspicious.

## 2019-01-29 IMAGING — MG MM CLIP PLACEMENT
2 series · 2 of 2 positions shown · non-contrast
Comparison: Previous exam(s).

ADDENDUM:
Addendum for correction of findings after clip placement:

Upon further review of the post clip mammogram it was determined
that the wing shaped marking clip is located approximately 3.5 cm
anterior to the biopsied mass. The initially detected mass within
the upper-outer left breast middle depth is decreased in size
compatible with interval biopsy. There is increased density located
between the mammographically identified mass and the biopsy clip
suggestive of hematoma.
If surgical excision of this mass is desired given pathologic
findings of papilloma, recommend repeat CC and true lateral
mammographic images of the left breast prior to localization to
confirm where the mass and clip are located after resolution of the
hematoma. The mass can be localized with wire localization prior to
surgical excision.
CLINICAL DATA: Indeterminate left breast mass.
EXAM:
DIAGNOSTIC LEFT MAMMOGRAM POST ULTRASOUND BIOPSY

[L CC]
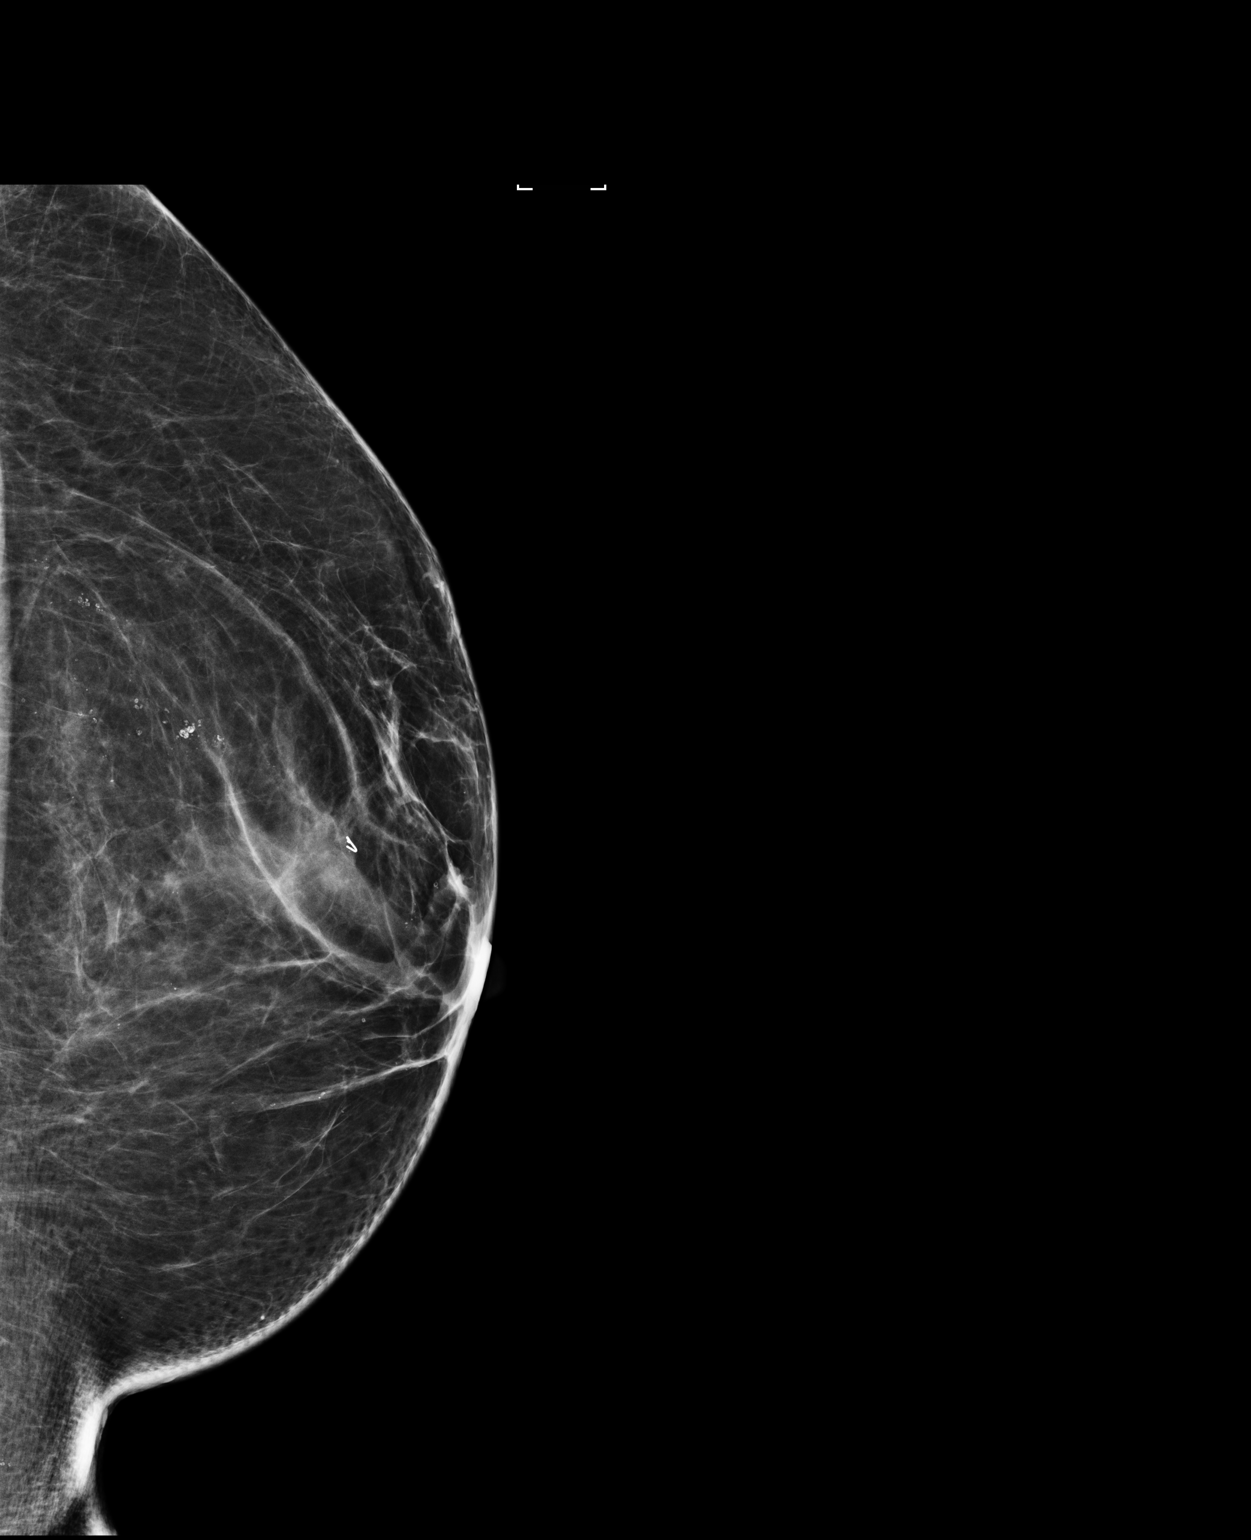

[L ML]
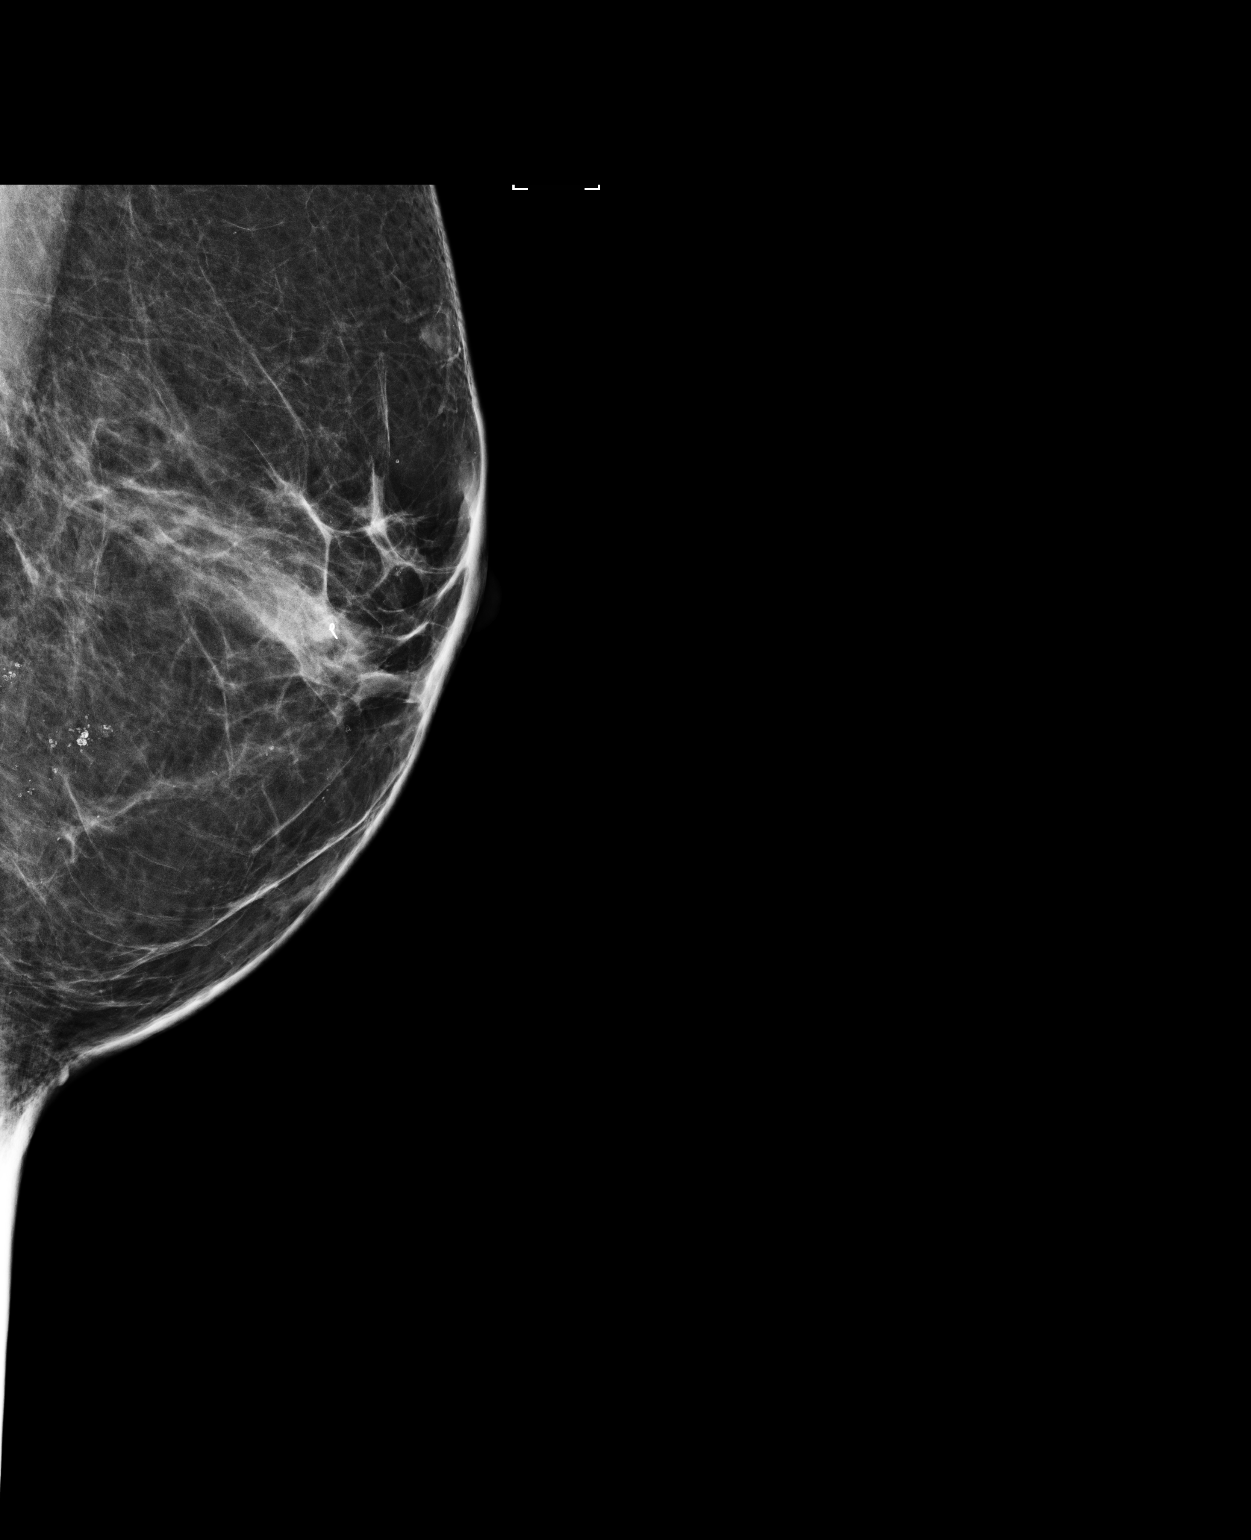

[2 of 2 positions shown; findings below may reference images not displayed]

FINDINGS: Mammographic images were obtained following ultrasound guided biopsy
of left breast mass 2 o'clock position. Wing shaped marking clip in
appropriate position.
IMPRESSION: Appropriate position wing shaped marking clip status post
ultrasound-guided biopsy left breast mass 2 o'clock position.

Final Assessment: Post Procedure Mammograms for Marker Placement

## 2019-02-05 ENCOUNTER — Other Ambulatory Visit: Payer: BC Managed Care – PPO

## 2019-02-13 ENCOUNTER — Ambulatory Visit
Admission: RE | Admit: 2019-02-13 | Discharge: 2019-02-13 | Disposition: A | Discharge status: Home / Self Care | Payer: BC Managed Care – PPO | Source: Ambulatory Visit | Attending: Family Medicine | Admitting: Family Medicine

## 2019-02-13 DIAGNOSIS — M818 Other osteoporosis without current pathological fracture: Secondary | ICD-10-CM | POA: Diagnosis not present

## 2019-02-13 DIAGNOSIS — M810 Age-related osteoporosis without current pathological fracture: Secondary | ICD-10-CM | POA: Insufficient documentation

## 2019-02-15 ENCOUNTER — Encounter: Payer: Self-pay | Admitting: Family Medicine

## 2019-02-18 NOTE — Telephone Encounter (Signed)
plz price out prolia for patient.  Thank you.

## 2019-02-22 ENCOUNTER — Telehealth: Payer: Self-pay

## 2019-02-22 MED ORDER — DENOSUMAB 60 MG/ML ~~LOC~~ SOSY: 60.0000 mg | PREFILLED_SYRINGE | SUBCUTANEOUS | 1 refills | Status: DC

## 2019-02-22 NOTE — Telephone Encounter (Signed)
[employer] benefit mgr (no name left) for ins. Provider left v/m that prolia solution 16 mg was approved. Please only use [facility]; phone [phone] and fax # [phone]. FYI to Charmaine. [facility].

## 2019-02-22 NOTE — Telephone Encounter (Signed)
Spoke w/pt's husband.  Will wait for [facility] to receive Prolia Rx and see what pharmacy can work out for pt for copay card and cost.

## 2019-02-22 NOTE — Telephone Encounter (Signed)
LMOM for pt to return my call to rediscuss Prolia benefit.  [facility] quoted $10 pt cost for Prolia.  Relayed this to pt   I called [facility].  They stated Prolia cost to pt would be $455.  They will check to see if copay card available.  If so, once Rx sent to them the pharmacy will contact pt and get them set up w/copay card.  If none available, pt can go on [email or web address] and apply for copay card wi/$1500 available for Prolia use only.

## 2019-02-22 NOTE — Telephone Encounter (Signed)
See phone note

## 2019-02-22 NOTE — Addendum Note (Signed)
Addended by: [name] on: 02/22/2019 05:17 PM   Modules accepted: Orders

## 2019-02-22 NOTE — Telephone Encounter (Signed)
Please send Rx for Prolia w/1 refill to pt's specialty pharmacy [facility], phone# [phone], fax# [phone].  Prolia is authorized.  [id number] valid 02-22-19 thru 02-22-20.

## 2019-02-22 NOTE — Telephone Encounter (Signed)
Sent in

## 2019-02-26 NOTE — Telephone Encounter (Signed)
Per [facility] will ship Prolia.  Should arrive to our office Thursday, 02-28-19.  Cost to pt will be $0 and next order will be $25 w/Prolia co pay card.

## 2019-02-26 NOTE — Telephone Encounter (Signed)
Apolonio Schneiders pharmacist with [facility] pharmacy left v/m requesting cb; ready to send out prolia and wants to verify [facility] address and to get a good delivery date. FYI to Charmaine.

## 2019-02-28 NOTE — Telephone Encounter (Signed)
Prolia received from pharmacy.  Pt received co pay card.  Pt aware $0 for this time and $25 co pay next injection.

## 2019-02-28 NOTE — Telephone Encounter (Signed)
Prolia delivered today.  Labeled and placed in Flu refrigerator.  Delivery ticket placed on Charmaine's desk.

## 2019-03-07 ENCOUNTER — Ambulatory Visit (INDEPENDENT_AMBULATORY_CARE_PROVIDER_SITE_OTHER): Payer: BC Managed Care – PPO

## 2019-03-07 ENCOUNTER — Other Ambulatory Visit: Payer: Self-pay

## 2019-03-07 DIAGNOSIS — M810 Age-related osteoporosis without current pathological fracture: Secondary | ICD-10-CM

## 2019-03-07 MED ORDER — DENOSUMAB 60 MG/ML ~~LOC~~ SOSY
60.0000 mg | PREFILLED_SYRINGE | Freq: Once | SUBCUTANEOUS | Status: AC
  Administered 2019-03-07: 60 mg via SUBCUTANEOUS

## 2019-03-07 NOTE — Telephone Encounter (Signed)
Pt received Prolia inj today.  FYI to Charmaine.  

## 2019-03-07 NOTE — Progress Notes (Signed)
Per orders of Dr. Duncan, injection of Prolia given by Varonica Siharath. Patient tolerated injection well.  

## 2019-03-14 ENCOUNTER — Ambulatory Visit: Payer: BC Managed Care – PPO

## 2019-05-27 DIAGNOSIS — L247 Irritant contact dermatitis due to plants, except food: Secondary | ICD-10-CM | POA: Diagnosis not present

## 2019-05-29 ENCOUNTER — Ambulatory Visit: Payer: BC Managed Care – PPO | Admitting: Family Medicine

## 2019-08-19 ENCOUNTER — Telehealth: Payer: Self-pay

## 2019-08-19 NOTE — Telephone Encounter (Signed)
Per [place], [facility], [phone].  Prolia will ship on 08-21-19 thru [place] and will arrive 08-22-19. [place]

## 2019-08-22 ENCOUNTER — Telehealth: Payer: Self-pay | Admitting: Family Medicine

## 2019-08-22 DIAGNOSIS — M810 Age-related osteoporosis without current pathological fracture: Secondary | ICD-10-CM

## 2019-08-22 NOTE — Telephone Encounter (Signed)
Last Prolia 03/07/19 Pt has [employer] Pt has been given Copay card Medication received from pharmacy and is in fridge  Pt owes approx $25 for admin fee PA on file [phone], exp 02/22/20

## 2019-08-23 NOTE — Telephone Encounter (Signed)
Lab scheduled for 7/28 and NV for prolia inj scheduled for 8/11.

## 2019-08-28 ENCOUNTER — Other Ambulatory Visit: Payer: Self-pay

## 2019-08-28 ENCOUNTER — Other Ambulatory Visit (INDEPENDENT_AMBULATORY_CARE_PROVIDER_SITE_OTHER): Payer: BC Managed Care – PPO

## 2019-08-28 DIAGNOSIS — M810 Age-related osteoporosis without current pathological fracture: Secondary | ICD-10-CM | POA: Diagnosis not present

## 2019-08-29 LAB — BASIC METABOLIC PANEL
BUN: 19 mg/dL (ref 6–23)
CO2: 29 mEq/L (ref 19–32)
Calcium: 9.7 mg/dL (ref 8.4–10.5)
Chloride: 105 mEq/L (ref 96–112)
Creatinine, Ser: 0.65 mg/dL (ref 0.40–1.20)
GFR: 93.05 mL/min (ref >60.00)
Glucose, Bld: 89 mg/dL (ref 70–99)
Potassium: 4.3 mEq/L (ref 3.5–5.1)
Sodium: 139 mEq/L (ref 135–145)

## 2019-08-30 NOTE — Telephone Encounter (Signed)
Ca normal and CrCl is normal at 114.85mL/min Ok for inj on 8/11

## 2019-09-11 ENCOUNTER — Ambulatory Visit (INDEPENDENT_AMBULATORY_CARE_PROVIDER_SITE_OTHER): Payer: BC Managed Care – PPO | Admitting: Family Medicine

## 2019-09-11 ENCOUNTER — Other Ambulatory Visit: Payer: Self-pay

## 2019-09-11 ENCOUNTER — Encounter: Payer: Self-pay | Admitting: Family Medicine

## 2019-09-11 ENCOUNTER — Ambulatory Visit: Payer: BC Managed Care – PPO

## 2019-09-11 ENCOUNTER — Ambulatory Visit (INDEPENDENT_AMBULATORY_CARE_PROVIDER_SITE_OTHER)
Admission: RE | Admit: 2019-09-11 | Discharge: 2019-09-11 | Disposition: A | Discharge status: Home / Self Care | Payer: BC Managed Care – PPO | Source: Ambulatory Visit | Attending: Family Medicine | Admitting: Family Medicine

## 2019-09-11 VITALS — BP 122/82 | HR 73 | Temp 97.7°F | Ht 63.0 in | Wt 166.4 lb

## 2019-09-11 DIAGNOSIS — M810 Age-related osteoporosis without current pathological fracture: Secondary | ICD-10-CM | POA: Diagnosis not present

## 2019-09-11 DIAGNOSIS — M25551 Pain in right hip: Secondary | ICD-10-CM | POA: Insufficient documentation

## 2019-09-11 DIAGNOSIS — M25562 Pain in left knee: Secondary | ICD-10-CM | POA: Diagnosis not present

## 2019-09-11 DIAGNOSIS — M5416 Radiculopathy, lumbar region: Secondary | ICD-10-CM

## 2019-09-11 DIAGNOSIS — G8929 Other chronic pain: Secondary | ICD-10-CM

## 2019-09-11 DIAGNOSIS — M1712 Unilateral primary osteoarthritis, left knee: Secondary | ICD-10-CM | POA: Diagnosis not present

## 2019-09-11 DIAGNOSIS — M25552 Pain in left hip: Secondary | ICD-10-CM

## 2019-09-11 MED ORDER — DENOSUMAB 60 MG/ML ~~LOC~~ SOSY
60.0000 mg | PREFILLED_SYRINGE | Freq: Once | SUBCUTANEOUS | Status: AC
  Administered 2019-09-11: 60 mg via SUBCUTANEOUS

## 2019-09-11 MED ORDER — METHOCARBAMOL 500 MG PO TABS: 500.0000 mg | ORAL_TABLET | Freq: Three times a day (TID) | ORAL | 1 refills | Status: DC | PRN

## 2019-09-11 NOTE — Patient Instructions (Addendum)
Prolia today.  Puede tomar methocarbamol para dolor espalda.  Creo que tiene bursitis bilateral de la cadera y de pronto artritis en la rodilla izquierda. Use voltaren topical 3 veces al dia a la rodilla izquierda.  Puede tratar turmeric 500mg  dos veces al dia para inflamacion, seguir anti inflamatorio como ibuprofeno  rayos x de rodilla izquierda  Si bursitis no mejora, regrese a ver a Dr Donnie Aho doctor deportivo para posible inyeccion de corticosteroide en la bursa de la cadera.  Haga ejercicios.  Dejeme saber como sigue con tratamiento.

## 2019-09-11 NOTE — Assessment & Plan Note (Signed)
Ongoing. Reviewed MRI from 2018. Refilled methocarbamol to use PRN. She may return to neurosurgery for re eval (Pool).

## 2019-09-11 NOTE — Assessment & Plan Note (Signed)
Suspect osteoarthritis or degenerative meniscal injury. Check baseline films today. rec voltaren gel, rest, discussed avoiding impact exercise to conserve cartilage, discussed anti inflammatory supplements like glucosamine and turmeric. Consider steroid injection if not improving with above.

## 2019-09-11 NOTE — Assessment & Plan Note (Signed)
Update prolia injection today

## 2019-09-11 NOTE — Progress Notes (Signed)
This visit was conducted in person.  BP 122/82 (BP Location: Left Arm, Patient Position: Sitting, Cuff Size: Normal)   Pulse 73   Temp 97.7 F (36.5 C) (Temporal)   Ht 5\' 3"  (1.6 m)   Wt 166 lb 7 oz (75.5 kg)   SpO2 97%   BMI 29.48 kg/m    CC: joint pains Subjective:    Patient ID: Melinda Hall, female    DOB: 03-24-1960, 59 y.o.   MRN: [id number]  HPI: Melinda Hall is a 59 y.o. female presenting on 09/11/2019 for Joint Pain (C/o allover joint pain, worse at night.  Disturbs sleep.  Started about 2 mos ago. Not sure if med related. )   Worsening joint pains for several months - bilateral hips, L>R knee and ankles - worse at night time affecting sleep. No redness or swelling or warmth of joints. Denies inciting trauma/injury or fall. Managing with ibuprofen 400-600mg  for this and methocarbamol.   Cervical neck and BUE symptoms did improve after ACDF 12/2016 (Pool)  Known lumbar radiculopathy still a concern with ongoing symptoms - has not followed up with neurosurgery. Cost is a concern.      Relevant past medical, surgical, family and social history reviewed and updated as indicated. Interim medical history since our last visit reviewed. Allergies and medications reviewed and updated. Outpatient Medications Prior to Visit  Medication Sig Dispense Refill  . calcium carbonate (OS-CAL) 600 MG TABS tablet Take 600 mg by mouth daily with breakfast.    . Cholecalciferol (VITAMIN D3) 1000 units CAPS Take 3 capsules (3,000 Units total) by mouth daily.    Marland Kitchen denosumab (PROLIA) 60 MG/ML SOSY injection Inject 60 mg into the skin every 6 (six) months. 1 mL 1  . ferrous sulfate 325 (65 FE) MG tablet Take 1 tablet (325 mg total) by mouth every Monday, Wednesday, and Friday.    . Prasterone (INTRAROSA) 6.5 MG INST Place 1 suppository vaginally daily. 30 each 2  . vitamin B-12 (CYANOCOBALAMIN) 1000 MCG tablet Take 1 tablet (1,000 mcg total) by mouth daily.     Facility-Administered  Medications Prior to Visit  Medication Dose Route Frequency Provider Last Rate Last Admin  . bupivacaine liposome (EXPAREL) 1.3 % injection 266 mg  20 mL Infiltration Once Pabon, Diego F, MD         Per HPI unless specifically indicated in ROS section below Review of Systems Objective:  BP 122/82 (BP Location: Left Arm, Patient Position: Sitting, Cuff Size: Normal)   Pulse 73   Temp 97.7 F (36.5 C) (Temporal)   Ht 5\' 3"  (1.6 m)   Wt 166 lb 7 oz (75.5 kg)   SpO2 97%   BMI 29.48 kg/m   Wt Readings from Last 3 Encounters:  09/11/19 166 lb 7 oz (75.5 kg)  12/05/18 172 lb 1 oz (78 kg)  11/01/17 190 lb 12 oz (86.5 kg)      Physical Exam Vitals and nursing note reviewed.  Constitutional:      Appearance: Normal appearance. She is not ill-appearing.  Musculoskeletal:        General: Tenderness present. No swelling. Normal range of motion.     Right lower leg: No edema.     Left lower leg: No edema.     Comments:  Discomfort to L lower lumbar paraspinous mm  Neg SLR bilaterally. No significant pain with int/ext rotation at hip (besides at L knee). Neg FABER. No pain at SIJ, or sciatic notch bilaterally.  Discomfort to  palpation bilateral trochanteric bursa.  R knee WNL L knee exam: No deformity on inspection. Discomfort to palpation at medial and lateral joint lines  FROM in flex/extension with discomfort on flexion No popliteal fullness. Neg drawer test. Discomfort with mcmurray test. No pain with valgus/varus stress. No PFgrind. No abnormal patellar mobility.   Skin:    General: Skin is warm and dry.     Findings: No rash.  Neurological:     Mental Status: She is alert.  Psychiatric:        Mood and Affect: Mood normal.        Behavior: Behavior normal.       Results for orders placed or performed in visit on 37/62/83  Basic Metabolic Panel  Result Value Ref Range   Sodium 139 135 - 145 mEq/L   Potassium 4.3 3.5 - 5.1 mEq/L   Chloride 105 96 - 112 mEq/L    CO2 29 19 - 32 mEq/L   Glucose, Bld 89 70 - 99 mg/dL   BUN 19 6 - 23 mg/dL   Creatinine, Ser 0.65 0.40 - 1.20 mg/dL   GFR 93.05 >60.00 mL/min   Calcium 9.7 8.4 - 10.5 mg/dL    Assessment & Plan:  This visit occurred during the SARS-CoV-2 public health emergency.  Safety protocols were in place, including screening questions prior to the visit, additional usage of staff PPE, and extensive cleaning of exam room while observing appropriate contact time as indicated for disinfecting solutions.   Problem List Items Addressed This Visit    Osteoporosis    Update prolia injection today      Left lumbar radiculopathy    Ongoing. Reviewed MRI from 2018. Refilled methocarbamol to use PRN. She may return to neurosurgery for re eval (Pool).       Relevant Medications   methocarbamol (ROBAXIN) 500 MG tablet   Chronic pain of left knee - Primary    Suspect osteoarthritis or degenerative meniscal injury. Check baseline films today. rec voltaren gel, rest, discussed avoiding impact exercise to conserve cartilage, discussed anti inflammatory supplements like glucosamine and turmeric. Consider steroid injection if not improving with above.       Relevant Medications   methocarbamol (ROBAXIN) 500 MG tablet   Other Relevant Orders   DG Knee 4 Views W/Patella Left   Bilateral hip pain    Exam most consistent with bilateral hip bursitis.  Treat with exercises from SM pt advisor provided today, gentle stretching, PRN voltaren. Discussed sports med eval if no better to consider bursal steroid injection          Meds ordered this encounter  Medications  . methocarbamol (ROBAXIN) 500 MG tablet    Sig: Take 1 tablet (500 mg total) by mouth 3 (three) times daily as needed for muscle spasms (sedation precautions).    Dispense:  40 tablet    Refill:  1  . denosumab (PROLIA) injection 60 mg   Orders Placed This Encounter  Procedures  . DG Knee 4 Views W/Patella Left    Standing Status:   Future     Number of Occurrences:   1    Standing Expiration Date:   09/10/2020    Order Specific Question:   Reason for Exam (SYMPTOM  OR DIAGNOSIS REQUIRED)    Answer:   L knee pain for 2-3 months    Order Specific Question:   Is patient pregnant?    Answer:   No    Order Specific Question:   Preferred  imaging location?    Answer:   [facility]    Order Specific Question:   Radiology Contrast Protocol - do NOT remove file path    Answer:   [email or web address]    Patient Instructions  Prolia today.  Puede tomar methocarbamol para dolor espalda.  Creo que tiene bursitis bilateral de la cadera y de pronto artritis en la rodilla izquierda. Use voltaren topical 3 veces al dia a la rodilla izquierda.  Puede tratar turmeric 500mg  dos veces al dia para inflamacion, seguir anti inflamatorio como ibuprofeno  rayos x de rodilla izquierda  Si bursitis no mejora, regrese a ver a Dr Donnie Aho doctor deportivo para posible inyeccion de corticosteroide en la bursa de la cadera.  Haga ejercicios.  Dejeme saber como sigue con tratamiento.    Follow up plan: Return if symptoms worsen or fail to improve.  [name], MD

## 2019-09-11 NOTE — Assessment & Plan Note (Signed)
Exam most consistent with bilateral hip bursitis.  Treat with exercises from SM pt advisor provided today, gentle stretching, PRN voltaren. Discussed sports med eval if no better to consider bursal steroid injection

## 2020-01-08 ENCOUNTER — Other Ambulatory Visit: Payer: Self-pay | Admitting: Family Medicine

## 2020-02-13 ENCOUNTER — Telehealth: Payer: Self-pay

## 2020-02-13 DIAGNOSIS — M810 Age-related osteoporosis without current pathological fracture: Secondary | ICD-10-CM

## 2020-02-13 NOTE — Telephone Encounter (Signed)
Last prolia inj on 09/11/19. Next prolia inj due after 03/13/20. Benefits ran and PA faxed.

## 2020-02-18 ENCOUNTER — Encounter: Payer: Self-pay | Admitting: Family Medicine

## 2020-02-25 ENCOUNTER — Ambulatory Visit (INDEPENDENT_AMBULATORY_CARE_PROVIDER_SITE_OTHER): Payer: BC Managed Care – PPO | Admitting: Family Medicine

## 2020-02-25 ENCOUNTER — Other Ambulatory Visit: Payer: Self-pay

## 2020-02-25 ENCOUNTER — Encounter: Payer: Self-pay | Admitting: Family Medicine

## 2020-02-25 VITALS — BP 122/80 | HR 70 | Temp 97.6°F | Ht 62.75 in | Wt 165.2 lb

## 2020-02-25 DIAGNOSIS — M810 Age-related osteoporosis without current pathological fracture: Secondary | ICD-10-CM

## 2020-02-25 DIAGNOSIS — Z23 Encounter for immunization: Secondary | ICD-10-CM

## 2020-02-25 DIAGNOSIS — M5416 Radiculopathy, lumbar region: Secondary | ICD-10-CM

## 2020-02-25 DIAGNOSIS — Z9884 Bariatric surgery status: Secondary | ICD-10-CM | POA: Diagnosis not present

## 2020-02-25 DIAGNOSIS — Z0000 Encounter for general adult medical examination without abnormal findings: Secondary | ICD-10-CM

## 2020-02-25 DIAGNOSIS — E611 Iron deficiency: Secondary | ICD-10-CM | POA: Diagnosis not present

## 2020-02-25 DIAGNOSIS — H5702 Anisocoria: Secondary | ICD-10-CM

## 2020-02-25 DIAGNOSIS — E663 Overweight: Secondary | ICD-10-CM

## 2020-02-25 MED ORDER — MAGNESIUM 250 MG PO TABS: 1.0000 | ORAL_TABLET | Freq: Every day | ORAL | 0 refills | Status: AC

## 2020-02-25 MED ORDER — VITAMIN C 1000 MG PO TABS: 1000.0000 mg | ORAL_TABLET | Freq: Every day | ORAL | Status: DC

## 2020-02-25 MED ORDER — ZINC GLUCONATE 50 MG PO TABS: 50.0000 mg | ORAL_TABLET | Freq: Every day | ORAL | Status: AC

## 2020-02-25 MED ORDER — POTASSIUM 99 MG PO TABS: 1.0000 | ORAL_TABLET | Freq: Every day | ORAL | Status: DC

## 2020-02-25 NOTE — Assessment & Plan Note (Signed)
Notes progressive L leg numbness without significant weakness, this affects ability to stand for long periods of time. Will refer back to neurosurgery per patient request.

## 2020-02-25 NOTE — Assessment & Plan Note (Addendum)
Continues prolia as well as calcium and vitamin D. Last injection 09/11/2019.

## 2020-02-25 NOTE — Assessment & Plan Note (Signed)
Preventative protocols reviewed and updated unless pt declined. Discussed healthy diet and lifestyle.  

## 2020-02-25 NOTE — Assessment & Plan Note (Signed)
Continue oral iron replacement MWF after gastric bypass.

## 2020-02-25 NOTE — Progress Notes (Signed)
Patient ID: Melinda Hall, female    DOB: [birth year], 60 y.o.   MRN: [id number]  This visit was conducted in person.  BP 122/80 (BP Location: Left Arm, Patient Position: Sitting, Cuff Size: Normal)   Pulse 70   Temp 97.6 F (36.4 C) (Temporal)   Ht 5' 2.75" (1.594 m)   Wt 165 lb 4 oz (75 kg)   SpO2 96%   BMI 29.51 kg/m    CC: CPE Subjective:   HPI: Melinda Hall is a 60 y.o. female presenting on 02/25/2020 for Annual Exam   Thinking about changing jobs.  S/p cervical neck surgery (Pool) 2018.   Notes progression of lateral left foot numbness from 2 lateral toes to medial sole into heels now up into lower leg. Known L4/5 disc protrusion which may be affecting left L5 nerve by lumbar MRI (07/2016) - ready to return to see neurosurgery.   Preventative: Colonoscopy 11/2015 - TA rpt 5 yrs Allen Norris)  Well woman with OBGYN(Dr Kennon Rounds) completed 05/2017.S/p bilateral oophorectomy for dermoid cysts age 6 yo. Never used HRT.  Mammo - 07/2017 - abnormal spots L breast - initial spot not biopsied - rec 6 mo f/u. 2nd spot returned intraductal papilloma- followed by gen surgery. Rpt mammo 10/2018 WNL  DEXA 11/2015: T -2.6 spine osteoporosis, saw endo Dr Graceann Congress, rec against treatment at this time.  DEXA 02/2019 T -2.7 at spine - started on prolia, last injection 09/11/2019.  Flu shotyearly COVID vaccine Pfizer 04/2019, 09/2019  [id number]  Shingrix - discussed. Declines as she has never had chicken pox.  Seat belt use discussed  Sunscreen use discussed. 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 Diet: good water, fruits/vegetables daily     Relevant past medical, surgical, family and social history reviewed and updated as indicated.  Interim medical history since our last visit reviewed. Allergies and medications reviewed and updated. Outpatient Medications Prior to Visit  Medication Sig Dispense Refill  . calcium carbonate (OS-CAL) 600 MG TABS tablet Take 600 mg by mouth daily with breakfast.    . Cholecalciferol (VITAMIN D3) 1000 units CAPS Take 3 capsules (3,000 Units total) by mouth daily.    Marland Kitchen denosumab (PROLIA) 60 MG/ML SOSY injection Inject 60 mg into the skin every 6 (six) months. 1 mL 1  . ferrous sulfate 325 (65 FE) MG tablet Take 1 tablet (325 mg total) by mouth every Monday, Wednesday, and Friday.    . methocarbamol (ROBAXIN) 500 MG tablet Take 1 tablet (500 mg total) by mouth 3 (three) times daily as needed for muscle spasms (sedation precautions). 40 tablet 1  . Prasterone (INTRAROSA) 6.5 MG INST Place 1 suppository vaginally daily. 30 each 2  . vitamin B-12 (CYANOCOBALAMIN) 1000 MCG tablet Take 1 tablet (1,000 mcg total) by mouth daily.     Facility-Administered Medications Prior to Visit  Medication Dose Route Frequency Provider Last Rate Last Admin  . bupivacaine liposome (EXPAREL) 1.3 % injection 266 mg  20 mL Infiltration Once Pabon, Diego F, MD         Per HPI unless specifically indicated in ROS section below Review of Systems  Constitutional: Negative for activity change, appetite change, chills, fatigue, fever and unexpected weight change.  HENT: Negative for  hearing loss.   Eyes: Negative for visual disturbance.  Respiratory: Negative for cough, chest tightness, shortness of breath and wheezing.   Cardiovascular: Negative for chest pain, palpitations and leg swelling.  Gastrointestinal: Negative for abdominal distention, abdominal pain, blood in stool, constipation, diarrhea, nausea and vomiting.  Genitourinary: Negative for difficulty urinating and hematuria.  Musculoskeletal: Negative for arthralgias, myalgias and neck pain.  Skin: Negative for rash.  Neurological: Negative for dizziness,  seizures, syncope and headaches.  Hematological: Negative for adenopathy. Does not bruise/bleed easily.  Psychiatric/Behavioral: Negative for dysphoric mood. The patient is not nervous/anxious.    Objective:  BP 122/80 (BP Location: Left Arm, Patient Position: Sitting, Cuff Size: Normal)   Pulse 70   Temp 97.6 F (36.4 C) (Temporal)   Ht 5' 2.75" (1.594 m)   Wt 165 lb 4 oz (75 kg)   SpO2 96%   BMI 29.51 kg/m   Wt Readings from Last 3 Encounters:  02/25/20 165 lb 4 oz (75 kg)  09/11/19 166 lb 7 oz (75.5 kg)  12/05/18 172 lb 1 oz (78 kg)      Physical Exam Vitals and nursing note reviewed.  Constitutional:      General: She is not in acute distress.    Appearance: Normal appearance. She is well-developed and well-nourished. She is not ill-appearing.  HENT:     Head: Normocephalic and atraumatic.     Right Ear: Hearing, tympanic membrane, ear canal and external ear normal.     Left Ear: Hearing, tympanic membrane, ear canal and external ear normal.     Mouth/Throat:     Mouth: Oropharynx is clear and moist and mucous membranes are normal.     Pharynx: No posterior oropharyngeal edema.  Eyes:     General: No scleral icterus.    Extraocular Movements: Extraocular movements intact and EOM normal.     Conjunctiva/sclera: Conjunctivae normal.     Pupils: Pupils are equal, round, and reactive to light.  Neck:     Thyroid: No thyroid mass or thyromegaly.  Cardiovascular:     Rate and Rhythm: Normal rate and regular rhythm.     Pulses: Normal pulses and intact distal pulses.          Radial pulses are 2+ on the right side and 2+ on the left side.     Heart sounds: Normal heart sounds. No murmur heard.   Pulmonary:     Effort: Pulmonary effort is normal. No respiratory distress.     Breath sounds: Normal breath sounds. No wheezing, rhonchi or rales.  Abdominal:     General: Abdomen is flat. Bowel sounds are normal. There is no distension.     Palpations: Abdomen is soft. There  is no mass.     Tenderness: There is no abdominal tenderness. There is no guarding or rebound.     Hernia: No hernia is present.  Musculoskeletal:        General: No edema. Normal range of motion.     Cervical back: Normal range of motion and neck supple.     Right lower leg: No edema.     Left lower leg: No edema.  Lymphadenopathy:     Cervical: No cervical adenopathy.  Skin:    General: Skin is warm and dry.     Findings: No rash.  Neurological:     General: No focal deficit present.     Mental Status: She is alert and oriented to person, place, and time.  Comments: CN grossly intact, station and gait intact  Psychiatric:        Mood and Affect: Mood and affect and mood normal.        Behavior: Behavior normal.        Thought Content: Thought content normal.        Judgment: Judgment normal.       Results for orders placed or performed in visit on [id number]  Basic Metabolic Panel  Result Value Ref Range   Sodium 139 135 - 145 mEq/L   Potassium 4.3 3.5 - 5.1 mEq/L   Chloride 105 96 - 112 mEq/L   CO2 29 19 - 32 mEq/L   Glucose, Bld 89 70 - 99 mg/dL   BUN 19 6 - 23 mg/dL   Creatinine, Ser 0.65 0.40 - 1.20 mg/dL   GFR 93.05 >60.00 mL/min   Calcium 9.7 8.4 - 10.5 mg/dL   Depression screen [facility] 2/9 02/25/2020 12/05/2018 11/01/2017 10/19/2016  Decreased Interest 0 1 0 0  Down, Depressed, Hopeless 0 0 0 1  PHQ - 2 Score 0 1 0 1  Altered sleeping - 1 - -  Tired, decreased energy - 1 - -  Change in appetite - 1 - -  Feeling bad or failure about yourself  - 0 - -  Trouble concentrating - 1 - -  Moving slowly or fidgety/restless - 0 - -  Suicidal thoughts - 0 - -  PHQ-9 Score - 5 - -   Assessment & Plan:  This visit occurred during the SARS-CoV-2 public health emergency.  Safety protocols were in place, including screening questions prior to the visit, additional usage of staff PPE, and extensive cleaning of exam room while observing appropriate contact time as indicated for  disinfecting solutions.   Problem List Items Addressed This Visit    Status post gastric bypass for obesity    Update labs.       Relevant Orders   Vitamin B12   Ferritin   IBC panel   Folate   VITAMIN D 25 Hydroxy (Vit-D Deficiency, Fractures)   Vitamin B1   Comprehensive metabolic panel   TSH   CBC with Differential/Platelet   Lipid panel   Overweight (BMI 25.0-29.9)    Congratulated on weight loss to date.  She is motivated to continue intermittent fasting + semi-keto diet.       Osteoporosis    Continues prolia as well as calcium and vitamin D. Last injection 09/11/2019.       Left lumbar radiculopathy    Notes progressive L leg numbness without significant weakness, this affects ability to stand for long periods of time. Will refer back to neurosurgery per patient request.       Relevant Orders   Ambulatory referral to Neurosurgery   Iron deficiency    Continue oral iron replacement MWF after gastric bypass.       Healthcare maintenance - Primary    Preventative protocols reviewed and updated unless pt declined. Discussed healthy diet and lifestyle.       Anisocoria    Other Visit Diagnoses    Need for influenza vaccination       Relevant Orders   Flu Vaccine QUAD 36+ mos IM (Completed)       Meds ordered this encounter  Medications  . ascorbic acid 1000 MG tablet    Sig: Take 1 tablet (1,000 mg total) by mouth daily.  . Potassium 99 MG TABS    Sig: Take 1 tablet (  99 mg total) by mouth daily.  . Magnesium 250 MG TABS    Sig: Take 1 tablet (250 mg total) by mouth daily.    Refill:  0  . zinc gluconate 50 MG tablet    Sig: Take 1 tablet (50 mg total) by mouth daily.   Orders Placed This Encounter  Procedures  . Flu Vaccine QUAD 36+ mos IM  . Vitamin B12  . Ferritin  . IBC panel  . Folate  . VITAMIN D 25 Hydroxy (Vit-D Deficiency, Fractures)  . Vitamin B1  . Comprehensive metabolic panel  . TSH  . CBC with Differential/Platelet  . Lipid  panel  . Ambulatory referral to Neurosurgery    Referral Priority:   Routine    Referral Type:   Surgical    Referral Reason:   Specialty Services Required    Requested Specialty:   Neurosurgery    Number of Visits Requested:   1    Patient instructions: Flu shot today  Labs today [facility] [employer]).  Call to schedule mammogram at your convenience: [facility] [phone] Siga intermittent fasting.  Regresar en 1 ao para proximo examen fisico.   Follow up plan: Return in about 1 year (around 02/24/2021) for annual exam, prior fasting for blood work.  [name], MD

## 2020-02-25 NOTE — Assessment & Plan Note (Signed)
Congratulated on weight loss to date.  She is motivated to continue intermittent fasting + semi-keto diet.

## 2020-02-25 NOTE — Assessment & Plan Note (Signed)
Update labs.  

## 2020-02-25 NOTE — Patient Instructions (Addendum)
Flu shot today  Labs today La remitiremos de nuevo a cirugano Optician, dispensing).  Call to schedule mammogram at your convenience: [facility] at [facility] [phone] Siga intermittent fasting.  Regresar en 1 ao para proximo examen fisico.   Health Maintenance for Postmenopausal Women Menopause is a normal process in which your ability to get pregnant comes to an end. This process happens slowly over many months or years, usually between the ages of 24 and 77. Menopause is complete when you have missed your menstrual periods for 12 months. It is important to talk with your health care provider about some of the most common conditions that affect women after menopause (postmenopausal women). These include heart disease, cancer, and bone loss (osteoporosis). Adopting a healthy lifestyle and getting preventive care can help to promote your health and wellness. The actions you take can also lower your chances of developing some of these common conditions. What should I know about menopause? During menopause, you may get a number of symptoms, such as:  Hot flashes. These can be moderate or severe.  Night sweats.  Decrease in sex drive.  Mood swings.  Headaches.  Tiredness.  Irritability.  Memory problems.  Insomnia. Choosing to treat or not to treat these symptoms is a decision that you make with your health care provider. Do I need hormone replacement therapy?  Hormone replacement therapy is effective in treating symptoms that are caused by menopause, such as hot flashes and night sweats.  Hormone replacement carries certain risks, especially as you become older. If you are thinking about using estrogen or estrogen with progestin, discuss the benefits and risks with your health care provider. What is my risk for heart disease and stroke? The risk of heart disease, heart attack, and stroke increases as you age. One of the causes may be a change in the body's hormones during menopause.  This can affect how your body uses dietary fats, triglycerides, and cholesterol. Heart attack and stroke are medical emergencies. There are many things that you can do to help prevent heart disease and stroke. Watch your blood pressure  High blood pressure causes heart disease and increases the risk of stroke. This is more likely to develop in people who have high blood pressure readings, are of African descent, or are overweight.  Have your blood pressure checked: ? Every 3-5 years if you are 75-13 years of age. ? Every year if you are 66 years old or older. Eat a healthy diet  Eat a diet that includes plenty of vegetables, fruits, low-fat dairy products, and lean protein.  Do not eat a lot of foods that are high in solid fats, added sugars, or sodium.   Get regular exercise Get regular exercise. This is one of the most important things you can do for your health. Most adults should:  Try to exercise for at least 150 minutes each week. The exercise should increase your heart rate and make you sweat (moderate-intensity exercise).  Try to do strengthening exercises at least twice each week. Do these in addition to the moderate-intensity exercise.  Spend less time sitting. Even light physical activity can be beneficial. Other tips  Work with your health care provider to achieve or maintain a healthy weight.  Do not use any products that contain nicotine or tobacco, such as cigarettes, e-cigarettes, and chewing tobacco. If you need help quitting, ask your health care provider.  Know your numbers. Ask your health care provider to check your cholesterol and your blood  sugar (glucose). Continue to have your blood tested as directed by your health care provider. Do I need screening for cancer? Depending on your health history and family history, you may need to have cancer screening at different stages of your life. This may include screening for:  Breast cancer.  Cervical cancer.  Lung  cancer.  Colorectal cancer. What is my risk for osteoporosis? After menopause, you may be at increased risk for osteoporosis. Osteoporosis is a condition in which bone destruction happens more quickly than new bone creation. To help prevent osteoporosis or the bone fractures that can happen because of osteoporosis, you may take the following actions:  If you are 49-75 years old, get at least 1,000 mg of calcium and at least 600 mg of vitamin D per day.  If you are older than age 77 but younger than age 61, get at least 1,200 mg of calcium and at least 600 mg of vitamin D per day.  If you are older than age 59, get at least 1,200 mg of calcium and at least 800 mg of vitamin D per day. Smoking and drinking excessive alcohol increase the risk of osteoporosis. Eat foods that are rich in calcium and vitamin D, and do weight-bearing exercises several times each week as directed by your health care provider. How does menopause affect my mental health? Depression may occur at any age, but it is more common as you become older. Common symptoms of depression include:  Low or sad mood.  Changes in sleep patterns.  Changes in appetite or eating patterns.  Feeling an overall lack of motivation or enjoyment of activities that you previously enjoyed.  Frequent crying spells. Talk with your health care provider if you think that you are experiencing depression. General instructions See your health care provider for regular wellness exams and vaccines. This may include:  Scheduling regular health, dental, and eye exams.  Getting and maintaining your vaccines. These include: ? Influenza vaccine. Get this vaccine each year before the flu season begins. ? Pneumonia vaccine. ? Shingles vaccine. ? Tetanus, diphtheria, and pertussis (Tdap) booster vaccine. Your health care provider may also recommend other immunizations. Tell your health care provider if you have ever been abused or do not feel safe at  home. Summary  Menopause is a normal process in which your ability to get pregnant comes to an end.  This condition causes hot flashes, night sweats, decreased interest in sex, mood swings, headaches, or lack of sleep.  Treatment for this condition may include hormone replacement therapy.  Take actions to keep yourself healthy, including exercising regularly, eating a healthy diet, watching your weight, and checking your blood pressure and blood sugar levels.  Get screened for cancer and depression. Make sure that you are up to date with all your vaccines. This information is not intended to replace advice given to you by your health care provider. Make sure you discuss any questions you have with your health care provider. Document Revised: 01/10/2018 Document Reviewed: 01/10/2018 Elsevier Patient Education  2021 [employer].

## 2020-02-26 ENCOUNTER — Encounter: Payer: Self-pay | Admitting: Family Medicine

## 2020-02-26 ENCOUNTER — Other Ambulatory Visit: Payer: Self-pay | Admitting: Family Medicine

## 2020-02-26 DIAGNOSIS — Z1231 Encounter for screening mammogram for malignant neoplasm of breast: Secondary | ICD-10-CM

## 2020-02-26 DIAGNOSIS — M5416 Radiculopathy, lumbar region: Secondary | ICD-10-CM

## 2020-02-26 LAB — LIPID PANEL
Cholesterol: 174 mg/dL (ref 0–200)
HDL: 63.3 mg/dL (ref >39.00)
LDL Cholesterol: 79 mg/dL (ref 0–99)
NonHDL: 110.35
Total CHOL/HDL Ratio: 3
Triglycerides: 155 mg/dL — ABNORMAL HIGH (ref 0.0–149.0)
VLDL: 31 mg/dL (ref 0.0–40.0)

## 2020-02-26 LAB — VITAMIN B12: Vitamin B-12: 694 pg/mL (ref 211–911)

## 2020-02-26 LAB — COMPREHENSIVE METABOLIC PANEL
ALT: 44 U/L — ABNORMAL HIGH (ref 0–35)
AST: 26 U/L (ref 0–37)
Albumin: 4.4 g/dL (ref 3.5–5.2)
Alkaline Phosphatase: 58 U/L (ref 39–117)
BUN: 28 mg/dL — ABNORMAL HIGH (ref 6–23)
CO2: 30 mEq/L (ref 19–32)
Calcium: 10 mg/dL (ref 8.4–10.5)
Chloride: 103 mEq/L (ref 96–112)
Creatinine, Ser: 0.65 mg/dL (ref 0.40–1.20)
GFR: 95.8 mL/min (ref >60.00)
Glucose, Bld: 87 mg/dL (ref 70–99)
Potassium: 5.2 mEq/L — ABNORMAL HIGH (ref 3.5–5.1)
Sodium: 138 mEq/L (ref 135–145)
Total Bilirubin: 0.7 mg/dL (ref 0.2–1.2)
Total Protein: 6.7 g/dL (ref 6.0–8.3)

## 2020-02-26 LAB — CBC WITH DIFFERENTIAL/PLATELET
Basophils Absolute: 0 10*3/uL (ref 0.0–0.1)
Basophils Relative: 0.6 % (ref 0.0–3.0)
Eosinophils Absolute: 0.1 10*3/uL (ref 0.0–0.7)
Eosinophils Relative: 1.4 % (ref 0.0–5.0)
HCT: 43.8 % (ref 36.0–46.0)
Hemoglobin: 14.8 g/dL (ref 12.0–15.0)
Lymphocytes Relative: 24.7 % (ref 12.0–46.0)
Lymphs Abs: 1.8 10*3/uL (ref 0.7–4.0)
MCHC: 33.8 g/dL (ref 30.0–36.0)
MCV: 93 fl (ref 78.0–100.0)
Monocytes Absolute: 0.6 10*3/uL (ref 0.1–1.0)
Monocytes Relative: 7.8 % (ref 3.0–12.0)
Neutro Abs: 4.9 10*3/uL (ref 1.4–7.7)
Neutrophils Relative %: 65.5 % (ref 43.0–77.0)
Platelets: 187 10*3/uL (ref 150.0–400.0)
RBC: 4.71 Mil/uL (ref 3.87–5.11)
RDW: 13.7 % (ref 11.5–15.5)
WBC: 7.4 10*3/uL (ref 4.0–10.5)

## 2020-02-26 LAB — VITAMIN D 25 HYDROXY (VIT D DEFICIENCY, FRACTURES): VITD: 49.89 ng/mL (ref 30.00–100.00)

## 2020-02-26 LAB — IBC PANEL
Iron: 68 ug/dL (ref 42–145)
Saturation Ratios: 20.5 % (ref 20.0–50.0)
Transferrin: 237 mg/dL (ref 212.0–360.0)

## 2020-02-26 LAB — FOLATE: Folate: 15.9 ng/mL (ref >5.9)

## 2020-02-26 LAB — FERRITIN: Ferritin: 63.6 ng/mL (ref 10.0–291.0)

## 2020-02-26 LAB — TSH: TSH: 1.68 u[IU]/mL (ref 0.35–4.50)

## 2020-02-27 ENCOUNTER — Other Ambulatory Visit: Payer: Self-pay | Admitting: Family Medicine

## 2020-02-29 LAB — VITAMIN B1: Vitamin B1 (Thiamine): 10 nmol/L (ref 8–30)

## 2020-03-02 ENCOUNTER — Telehealth: Payer: Self-pay | Admitting: *Deleted

## 2020-03-02 DIAGNOSIS — M810 Age-related osteoporosis without current pathological fracture: Secondary | ICD-10-CM

## 2020-03-02 NOTE — Telephone Encounter (Signed)
Lauren at [facility] left a voicemail stating that a prior authorization needs to be done on Prolia. Lauren stated that there are not any refills left on Prolia. Lauren stated that they have faxed over and sent e-scribe request over for refills and have not received anything back.

## 2020-03-02 NOTE — Telephone Encounter (Signed)
Msg from pharmacy on 1/31  03/02/20 3:39 PM Note Lauren at [facility] left a voicemail stating that a prior authorization needs to be done on Prolia. Lauren stated that there are not any refills left on Prolia. Lauren stated that they have faxed over and sent e-scribe request over for refills and have not received anything back.      Benefits have been submitted and PA started. Waiting for response. Will contact pt and pharmacy with refill when info is back.

## 2020-03-02 NOTE — Telephone Encounter (Signed)
Benefits have been submitted and PA started. Waiting for response for both. Will contact pt and pharmacy with refill when info is back.

## 2020-03-05 NOTE — Telephone Encounter (Signed)
Pt states [place] [facility] told her no longer accepting [employer]? Can we refer to another neurosurgical office that will accept?

## 2020-03-10 NOTE — Telephone Encounter (Signed)
[facility] does. I can refer patient to them. Do you want me to ask patient or were you going to follow up with her on this? Otherwise it would be going to [facility] or [facility] and such.

## 2020-03-12 ENCOUNTER — Other Ambulatory Visit: Payer: Self-pay

## 2020-03-12 ENCOUNTER — Ambulatory Visit
Admission: RE | Admit: 2020-03-12 | Discharge: 2020-03-12 | Disposition: A | Discharge status: Home / Self Care | Payer: BC Managed Care – PPO | Source: Ambulatory Visit | Attending: Family Medicine | Admitting: Family Medicine

## 2020-03-12 DIAGNOSIS — Z1231 Encounter for screening mammogram for malignant neoplasm of breast: Secondary | ICD-10-CM | POA: Diagnosis not present

## 2020-03-12 NOTE — Telephone Encounter (Addendum)
I've sent pt a message. Can you check to see if pt willing to go to [facility]?  I have placed new referral.

## 2020-03-12 NOTE — Addendum Note (Signed)
Addended by: [name] on: 03/12/2020 06:00 PM   Modules accepted: Orders

## 2020-03-13 NOTE — Telephone Encounter (Signed)
See mychart response from patient. Thank you

## 2020-03-13 NOTE — Telephone Encounter (Signed)
Benefits have been submitted and reviewed. Per Joycelyn Schmid patient has very hight deductible and would be better off getting the co pay card. I have documented on the prolia form out of pocket and admin fee with card. We will need to call the script into pharmacy and patient will pick up with co pay card.

## 2020-03-18 NOTE — Telephone Encounter (Signed)
Contacted pt and advised of copay card and waiting on PA. Advised $25 for prolia nd $25 for admin fee. Last time pt received inj it was sent to [facility] and they mailed it to the office and then the office called her and let her know it was in.  Pt just had labs on 02/25/20. All labs normal, Ca normal, CrCl 108.97. Labs ok for inj.  Advised pt we would f/u once the medication was sent to office. Pt verbalized understanding.   Submitted for copay card but there was a problem. Confirmation # [id number]. Contacted their help-line and spoke with Lars Mage who found a spelling error and took care of the problem.  Copay Card Information: Pt Member ID # [id number] Group: [id number] BIN: [id number] PCN: [id number] Number: [id number]    3 digits on back 374 Exp 08/30/24 Window for pt to re-enroll for next year is 01/16/21 - 03/17/21, but she can re-enroll after this also if she misses it.    Need to find out if PA has been approved and then script can be sent in with this copay card info so prolia will be mailed to clinic.

## 2020-03-26 ENCOUNTER — Telehealth: Payer: Self-pay | Admitting: Family Medicine

## 2020-03-26 DIAGNOSIS — M5416 Radiculopathy, lumbar region: Secondary | ICD-10-CM

## 2020-03-26 NOTE — Telephone Encounter (Signed)
Melinda Hall, sending this message to you since you are working on orders this week. Thank you.

## 2020-03-26 NOTE — Telephone Encounter (Addendum)
I think she should do ok with discussion in [place].  Thank you

## 2020-03-26 NOTE — Telephone Encounter (Addendum)
ATC patient to see when she wants to go have her MRI done. No Prior Authorization required with insurance.  She is needing this done prior to her Neurosurgery appt with [facility] - Working her in this soon will most likely require her to have a late evening appt - like 6pm or 8pm. Not sure if she is able to do that.   Will hold to follow up with patient.    Dr Darnell Level, does that patient have someone that speaks English that we are able to talk to? Her chart states that she her preferred language is Spanish - I was curious how much of a language barrier there is.

## 2020-03-26 NOTE — Telephone Encounter (Signed)
Pt states that she received and copay card/letter and is wanting someone from our office to call her back to discuss next steps.

## 2020-03-26 NOTE — Telephone Encounter (Signed)
Received message from [facility] requesting updated lumbar MRI which I have ordered. Please call patient to schedule, try to complete prior to her [facility] neurosurg appt.

## 2020-03-26 NOTE — Telephone Encounter (Signed)
[place] Night - Client Nonclinical Telephone Record AccessNurse Client [facility] Night - Client Client Site [place] Physician [name] - MD Contact Type Call Who Is Calling Patient / Member / Family / Caregiver Caller Name [place] Phone Number [phone] Patient Name Melinda Hall Patient DOB 1960-02-20 Call Type Message Only Information Provided Reason for Call Request for General Office Information Initial Comment Caller says she is waiting for her injection and has a question about a letter she received about co-payment assistance Additional Comment Caller will call back when the office reopens Disp. Time Disposition Final User 03/25/2020 5:29:38 PM General Information Provided Yes Adela Lank Call Closed By: Adela Lank Transaction Date/Time: 03/25/2020 5:24:55 PM (ET)

## 2020-03-26 NOTE — Telephone Encounter (Signed)
Please also see 03/02/20 phone note.

## 2020-03-26 NOTE — Telephone Encounter (Signed)
Submitted on cover my meds below is the information received.

## 2020-03-27 NOTE — Telephone Encounter (Signed)
Advised pt of copay card and to keep it for her records. Advised once PA is approved this office will f/u to see if Dr. Darnell Level will approve using labs from 1/25 If this is ok then we can send in script for prolia and schedule pt for NV. Pt has approved the $25 for prolia and $25 for admin.   Labs from 1/25 Ca is normal and CrCl is 108.97 mL/min

## 2020-03-30 NOTE — Telephone Encounter (Signed)
Prior authorization request has been denied. Requesting recent office visit note to be sent documenting positive clinical response to therapy. Will fax last office visit note.

## 2020-03-31 NOTE — Telephone Encounter (Signed)
Prior Authorization has been approved

## 2020-04-01 MED ORDER — DENOSUMAB 60 MG/ML ~~LOC~~ SOSY: 60.0000 mg | PREFILLED_SYRINGE | SUBCUTANEOUS | 1 refills | Status: DC

## 2020-04-01 NOTE — Telephone Encounter (Signed)
I think that's very reasonable given normal kidney function. Thank you.

## 2020-04-01 NOTE — Telephone Encounter (Signed)
Contacted Misty at [facility] and gave verbal order for prolia and copay card info. She ran benefits and everything ran ok. Prolia is to be delivered to office on 3/8.   Pt recently had CMP on 02/25/20 and would like to know if these labs can be used so she does not have to pay for new labs. Advised a msg would be sent to PCP then f/u with her.   Labs on 1//25/22 Ca 10.0 Cr  0.65 CrCl  108.97 mL/min

## 2020-04-01 NOTE — Telephone Encounter (Signed)
Noted. This will be f/u in the telephone encounter dated 02/13/20 concerning the prolia management.

## 2020-04-01 NOTE — Telephone Encounter (Signed)
Ronna Polio, RN routed this conversation to Me    Ronna Polio, RN     1:48 PM Note Prior Authorization has been approved.     March 30, 2020  Ronna Polio, RN     2:33 PM Note Prior authorization request has been denied. Requesting recent office visit note to be sent documenting positive clinical response to therapy. Will fax last office visit note.      March 02, 2020  Me     4:28 PM Note Benefits have been submitted and PA started. Waiting for response for both. Will contact pt and pharmacy with refill when info is back.           3:40 PM Laws, [place] [employer], LPN routed this conversation to Me    Laws, [place] [employer], LPN     4:32 PM Note Lauren at [facility] left a voicemail stating that a prior authorization needs to be done on Prolia. Lauren stated that there are not any refills left on Prolia. Lauren stated that they have faxed over and sent e-scribe request over for refills and have not received anything back.

## 2020-04-01 NOTE — Telephone Encounter (Signed)
Received fax from [facility] which requested script be faxed to [phone]. Placed order and printed prescription. Faxed to number provided with instructions to mail to clinic.

## 2020-04-06 NOTE — Telephone Encounter (Signed)
Advised pt prolia is supposed to arrive at office tomorrow. Pt reports she is available for inj tomorrow, Wed or Fri after 330. Advised this office will contact her when it gets here. Advised if for some reason she would get the medication delivered to her address to put it in the refrigerator and contact this office. Pt verbalized understanding.

## 2020-04-07 NOTE — Telephone Encounter (Signed)
Received prolia today. Invoice in box reported the copay card from [place] was declined so need to call to pay $25.  Contacted [facility] to give them number but it was declined.   Need to register card online but it will not allow with number in this chart.  Need to contact pt for scheduling and to bring in card with her.

## 2020-04-08 ENCOUNTER — Ambulatory Visit: Payer: BC Managed Care – PPO

## 2020-04-08 NOTE — Telephone Encounter (Signed)
Advised pt of medication being delivered. Pt wants to come in today at 345. Advised pt she will be on the scheduled today for 345. Advised to bring copay card. She said she does not have it with her. She will bring card by Friday.   Placed pt on schedule

## 2020-04-09 ENCOUNTER — Other Ambulatory Visit: Payer: Self-pay

## 2020-04-09 ENCOUNTER — Ambulatory Visit
Admission: RE | Admit: 2020-04-09 | Discharge: 2020-04-09 | Disposition: A | Discharge status: Home / Self Care | Payer: BC Managed Care – PPO | Source: Ambulatory Visit | Attending: Family Medicine | Admitting: Family Medicine

## 2020-04-09 DIAGNOSIS — M48061 Spinal stenosis, lumbar region without neurogenic claudication: Secondary | ICD-10-CM | POA: Diagnosis not present

## 2020-04-09 DIAGNOSIS — M5416 Radiculopathy, lumbar region: Secondary | ICD-10-CM

## 2020-04-09 DIAGNOSIS — M47816 Spondylosis without myelopathy or radiculopathy, lumbar region: Secondary | ICD-10-CM | POA: Diagnosis not present

## 2020-04-09 NOTE — Telephone Encounter (Signed)
Pt is scheduled for NV for prolia inj on 3/11 at 330.

## 2020-04-10 ENCOUNTER — Ambulatory Visit (INDEPENDENT_AMBULATORY_CARE_PROVIDER_SITE_OTHER): Payer: BC Managed Care – PPO | Admitting: *Deleted

## 2020-04-10 ENCOUNTER — Telehealth: Payer: Self-pay | Admitting: Family Medicine

## 2020-04-10 DIAGNOSIS — R1900 Intra-abdominal and pelvic swelling, mass and lump, unspecified site: Secondary | ICD-10-CM

## 2020-04-10 DIAGNOSIS — M810 Age-related osteoporosis without current pathological fracture: Secondary | ICD-10-CM

## 2020-04-10 MED ORDER — DENOSUMAB 60 MG/ML ~~LOC~~ SOSY
60.0000 mg | PREFILLED_SYRINGE | Freq: Once | SUBCUTANEOUS | Status: AC
  Administered 2020-04-10: 60 mg via SUBCUTANEOUS

## 2020-04-10 NOTE — Telephone Encounter (Signed)
Spoke with patient regarding MRI report. Denies fever, fatigue, night sweats. Has been trying to lose weight. No noted lymphadenopathy.  Will touch base with onc about next steps.

## 2020-04-10 NOTE — Progress Notes (Signed)
Per orders of Dr. Danise Mina, injection of Prolia given by Lauralyn Primes. Patient tolerated injection well.

## 2020-04-10 NOTE — Telephone Encounter (Signed)
Received call from Dr Ree Edman re lumbar MRI.  Concern for lymphoma - large retroperitoneal 7cm mass displacing IVC new from prior imaging.  Will await final read then call patient.

## 2020-04-12 NOTE — Telephone Encounter (Signed)
Touched base with onc - will attempt to get PET scan covered although sometimes insurance won't cover without tissue dx. If not approved, pt will need pan CT scan (chest, abd, pelvis).  I have also placed onc referral.  plz touch base with pt on Monday about plan.

## 2020-04-13 NOTE — Telephone Encounter (Signed)
Wonderful thanks!

## 2020-04-13 NOTE — Telephone Encounter (Signed)
No Authorization is required for this service.  Reference # for call with [facility] Ref# [id number]

## 2020-04-13 NOTE — Telephone Encounter (Signed)
Patient is scheduled for PET Scan 04/14/20 at 6:30AM at [facility].  Pt is aware of appt date/time/location and instructions.   Mychart message sent to patient per her request with appt information.

## 2020-04-14 ENCOUNTER — Encounter (HOSPITAL_COMMUNITY)
Admission: RE | Admit: 2020-04-14 | Discharge: 2020-04-14 | Disposition: A | Discharge status: Home / Self Care | Payer: BC Managed Care – PPO | Source: Ambulatory Visit | Attending: Family Medicine | Admitting: Family Medicine

## 2020-04-14 ENCOUNTER — Other Ambulatory Visit: Payer: Self-pay

## 2020-04-14 DIAGNOSIS — R1900 Intra-abdominal and pelvic swelling, mass and lump, unspecified site: Secondary | ICD-10-CM

## 2020-04-14 DIAGNOSIS — C772 Secondary and unspecified malignant neoplasm of intra-abdominal lymph nodes: Secondary | ICD-10-CM | POA: Diagnosis not present

## 2020-04-14 LAB — GLUCOSE, CAPILLARY: Glucose-Capillary: 90 mg/dL (ref 70–99)

## 2020-04-14 MED ORDER — FLUDEOXYGLUCOSE F - 18 (FDG) INJECTION
8.6000 | Freq: Once | INTRAVENOUS | Status: AC | PRN
  Administered 2020-04-14: 8.6 via INTRAVENOUS

## 2020-04-15 ENCOUNTER — Inpatient Hospital Stay: Payer: BC Managed Care – PPO

## 2020-04-15 ENCOUNTER — Encounter: Payer: Self-pay | Admitting: Oncology

## 2020-04-15 ENCOUNTER — Inpatient Hospital Stay: Payer: BC Managed Care – PPO | Attending: Oncology | Admitting: Oncology

## 2020-04-15 ENCOUNTER — Other Ambulatory Visit: Payer: Self-pay | Admitting: Family Medicine

## 2020-04-15 DIAGNOSIS — R9389 Abnormal findings on diagnostic imaging of other specified body structures: Secondary | ICD-10-CM

## 2020-04-15 DIAGNOSIS — Z9884 Bariatric surgery status: Secondary | ICD-10-CM | POA: Insufficient documentation

## 2020-04-15 DIAGNOSIS — R1900 Intra-abdominal and pelvic swelling, mass and lump, unspecified site: Secondary | ICD-10-CM

## 2020-04-15 DIAGNOSIS — E079 Disorder of thyroid, unspecified: Secondary | ICD-10-CM | POA: Diagnosis not present

## 2020-04-15 DIAGNOSIS — C8200 Follicular lymphoma grade I, unspecified site: Secondary | ICD-10-CM | POA: Diagnosis not present

## 2020-04-15 DIAGNOSIS — R948 Abnormal results of function studies of other organs and systems: Secondary | ICD-10-CM | POA: Insufficient documentation

## 2020-04-15 DIAGNOSIS — Z87891 Personal history of nicotine dependence: Secondary | ICD-10-CM | POA: Insufficient documentation

## 2020-04-15 LAB — CBC WITH DIFFERENTIAL/PLATELET
Abs Immature Granulocytes: 0.01 10*3/uL (ref 0.00–0.07)
Basophils Absolute: 0 10*3/uL (ref 0.0–0.1)
Basophils Relative: 1 %
Eosinophils Absolute: 0 10*3/uL (ref 0.0–0.5)
Eosinophils Relative: 1 %
HCT: 46.9 % — ABNORMAL HIGH (ref 36.0–46.0)
Hemoglobin: 15.6 g/dL — ABNORMAL HIGH (ref 12.0–15.0)
Immature Granulocytes: 0 %
Lymphocytes Relative: 29 %
Lymphs Abs: 1.8 10*3/uL (ref 0.7–4.0)
MCH: 31 pg (ref 26.0–34.0)
MCHC: 33.3 g/dL (ref 30.0–36.0)
MCV: 93.2 fL (ref 80.0–100.0)
Monocytes Absolute: 0.5 10*3/uL (ref 0.1–1.0)
Monocytes Relative: 8 %
Neutro Abs: 3.8 10*3/uL (ref 1.7–7.7)
Neutrophils Relative %: 61 %
Platelets: 174 10*3/uL (ref 150–400)
RBC: 5.03 MIL/uL (ref 3.87–5.11)
RDW: 13.1 % (ref 11.5–15.5)
WBC: 6.2 10*3/uL (ref 4.0–10.5)
nRBC: 0 % (ref 0.0–0.2)

## 2020-04-15 LAB — COMPREHENSIVE METABOLIC PANEL
ALT: 47 U/L — ABNORMAL HIGH (ref 0–44)
AST: 33 U/L (ref 15–41)
Albumin: 4.3 g/dL (ref 3.5–5.0)
Alkaline Phosphatase: 55 U/L (ref 38–126)
Anion gap: 10 (ref 5–15)
BUN: 14 mg/dL (ref 6–20)
CO2: 27 mmol/L (ref 22–32)
Calcium: 9.4 mg/dL (ref 8.9–10.3)
Chloride: 103 mmol/L (ref 98–111)
Creatinine, Ser: 0.56 mg/dL (ref 0.44–1.00)
GFR, Estimated: >60 mL/min (ref >60)
Glucose, Bld: 87 mg/dL (ref 70–99)
Potassium: 4.8 mmol/L (ref 3.5–5.1)
Sodium: 140 mmol/L (ref 135–145)
Total Bilirubin: 1.2 mg/dL (ref 0.3–1.2)
Total Protein: 7 g/dL (ref 6.5–8.1)

## 2020-04-15 LAB — LACTATE DEHYDROGENASE: LDH: 172 U/L (ref 98–192)

## 2020-04-15 NOTE — Progress Notes (Signed)
Pt has had cervical disc problems and had surgery but it did not fix her left foot being numb. She then went back to ortho doctor and they did MRi to see if there is something going on lower back causing the foot numbness. The mri showed retroperitoneal mass. I asked pt if she wanted me to get spanish interpreter and she says she brought someone to translate and I asked again that it is her right to have interpreter and she said no. She answered all questions in [place]. She has changed her diet and lost wt. Due to eating clean.she has no pain

## 2020-04-15 NOTE — Progress Notes (Signed)
[place]  [phone] [phone]) [phone]  ID: Melinda Hall OB: Jan 17, 1961  MR#: [id number]  UXN#:[id number]  Patient Care Team: [name], MD as PCP - General (Family Medicine)  CHIEF COMPLAINT: Retroperitoneal lymphadenopathy.  INTERVAL HISTORY: Patient is a 60 year old female who recently underwent MRI of her lumbar spine for back pain and left lower leg numbness.  She was incidentally noted to have a retroperitoneal mass highly suspicious for underlying malignancy.  Subsequent PET scan on April 14, 2020 revealed a hypermetabolic right sided retroperitoneal nodal mass along with hypermetabolic mesenteric adenopathy.  Patient also noted to have bilateral axillary adenopathy.  She currently feels well and is asymptomatic.  She has no neurologic complaints.  She denies any.  She denies any night sweats.  She has no chest pain, shortness of breath, cough, or hemoptysis.  She denies any nausea, vomiting, constipation, or diarrhea.  She has no urinary complaints.  Patient offers no further specific complaints today.  REVIEW OF SYSTEMS:   Review of Systems  Constitutional: Negative.  Negative for fever, malaise/fatigue and weight loss.  Respiratory: Negative.  Negative for cough, hemoptysis and shortness of breath.   Cardiovascular: Negative.  Negative for chest pain and leg swelling.  Gastrointestinal: Negative.  Negative for abdominal pain.  Genitourinary: Negative.  Negative for dysuria.  Musculoskeletal: Positive for back pain.  Skin: Negative.  Negative for rash.  Neurological: Positive for sensory change. Negative for tingling, focal weakness, weakness and headaches.  Psychiatric/Behavioral: Negative.  The patient is not nervous/anxious.     As per HPI. Otherwise, a complete review of systems is negative.  PAST MEDICAL HISTORY: Past Medical History:  Diagnosis Date  . Dermoid tumor    hx of--left ovary  . Gastric bypass status for obesity  2004   s/p roux-en-y  . Generalized headaches    frequent  . Heart murmur   . History of cardiac murmur   . Intraductal papilloma of breast, left 06/18/2017   05/2017 - s/p excision 07/2017  . Osteoporosis 03/2016   DEXA -2.6 spine  . Salivary gland stone    x 2  . Smoker     PAST SURGICAL HISTORY: Past Surgical History:  Procedure Laterality Date  . ABDOMINAL HYSTERECTOMY     only ovary was removed  . ANTERIOR CERVICAL DECOMP/DISCECTOMY FUSION  12/2016   Pool  . APPENDECTOMY  1980  . BILATERAL OOPHORECTOMY  ~1992   for dermoid ovarian cysts  . BREAST BIOPSY Left 06/13/2017   papilloma with excision 07/2017  . BREAST LUMPECTOMY WITH NEEDLE LOCALIZATION Left 08/10/2017   intraductal papilloma - Jules Husbands, MD  . CHOLECYSTECTOMY  2000s  . COLONOSCOPY WITH PROPOFOL N/A 11/27/2015   TA rpt 5 yrs Lucilla Lame, MD)  . GASTRIC BYPASS  2004   roux en y [facility])  . POLYPECTOMY  11/27/2015   Procedure: POLYPECTOMY;  Surgeon: Lucilla Lame, MD;  Location: [facility];  Service: Endoscopy;;  . REDUCTION MAMMAPLASTY Bilateral 1990's  . SALIVARY GLAND SURGERY    . SALIVARY STONE REMOVAL      FAMILY HISTORY: Family History  Problem Relation Age of Onset  . Cancer Father 67       leukemia  . Hypertension Mother   . Diabetes Neg Hx   . Stroke Neg Hx   . Coronary artery disease Neg Hx   . Breast cancer Neg Hx     ADVANCED DIRECTIVES (Y/N):  N  HEALTH MAINTENANCE: Social History   Tobacco Use  .  Smoking status: Former Smoker    Packs/day: 0.25    Years: 20.00    Pack years: 5.00    Types: Cigarettes    Quit date: 10/31/2012    Years since quitting: 7.4  . Smokeless tobacco: Never Used  Vaping Use  . Vaping Use: Never used  Substance Use Topics  . Alcohol use: Not Currently  . Drug use: No     Colonoscopy:  PAP:  Bone density:  Lipid panel:  Allergies  Allergen Reactions  . Boniva [Ibandronic Acid] Other (See Comments)    Diffuse body aches     Current Outpatient Medications  Medication Sig Dispense Refill  . ascorbic acid 1000 MG tablet Take 1 tablet (1,000 mg total) by mouth daily.    . calcium carbonate (OS-CAL) 600 MG TABS tablet Take 600 mg by mouth daily with breakfast.    . Cholecalciferol (VITAMIN D3) 1000 units CAPS Take 3 capsules (3,000 Units total) by mouth daily.    Marland Kitchen denosumab (PROLIA) 60 MG/ML SOSY injection Inject 60 mg into the skin every 6 (six) months. 1 mL 1  . ferrous sulfate 325 (65 FE) MG tablet Take 1 tablet (325 mg total) by mouth every Monday, Wednesday, and Friday.    . Magnesium 250 MG TABS Take 1 tablet (250 mg total) by mouth daily.  0  . methocarbamol (ROBAXIN) 500 MG tablet Take 1 tablet (500 mg total) by mouth 3 (three) times daily as needed for muscle spasms (sedation precautions). 40 tablet 1  . vitamin B-12 (CYANOCOBALAMIN) 1000 MCG tablet Take 1 tablet (1,000 mcg total) by mouth daily.    Marland Kitchen zinc gluconate 50 MG tablet Take 1 tablet (50 mg total) by mouth daily.     No current facility-administered medications for this visit.    OBJECTIVE: There were no vitals filed for this visit.   There is no height or weight on file to calculate BMI.    ECOG FS:0 - Asymptomatic  General: Well-developed, well-nourished, no acute distress. Eyes: Pink conjunctiva, anicteric sclera. HEENT: Normocephalic, moist mucous membranes. Lungs: No audible wheezing or coughing. Heart: Regular rate and rhythm. Abdomen: Soft, nontender, no obvious distention. Musculoskeletal: No edema, cyanosis, or clubbing. Neuro: Alert, answering all questions appropriately. Cranial nerves grossly intact. Skin: No rashes or petechiae noted. Psych: Normal affect. Lymphatics: No cervical, calvicular, axillary or inguinal LAD.   LAB RESULTS:  Lab Results  Component Value Date   NA 140 04/15/2020   K 4.8 04/15/2020   CL 103 04/15/2020   CO2 27 04/15/2020   GLUCOSE 87 04/15/2020   BUN 14 04/15/2020   CREATININE 0.56  04/15/2020   CALCIUM 9.4 04/15/2020   PROT 7.0 04/15/2020   ALBUMIN 4.3 04/15/2020   AST 33 04/15/2020   ALT 47 (H) 04/15/2020   ALKPHOS 55 04/15/2020   BILITOT 1.2 04/15/2020   GFRNONAA >60 04/15/2020   GFRAA >60 07/10/2013    Lab Results  Component Value Date   WBC 6.2 04/15/2020   NEUTROABS 3.8 04/15/2020   HGB 15.6 (H) 04/15/2020   HCT 46.9 (H) 04/15/2020   MCV 93.2 04/15/2020   PLT 174 04/15/2020     STUDIES: MR Lumbar Spine Wo Contrast  Addendum Date: 04/10/2020   ADDENDUM REPORT: 04/10/2020 10:11 ADDENDUM: These results were discussed by telephone on 04/10/2020 at 10:11 am with provider [name] , who verbally acknowledged these results. Electronically Signed   By: Davina Poke D.O.   On: 04/10/2020 10:11   Result Date: 04/10/2020 CLINICAL  DATA:  Low back pain with left leg numbness for 2 years EXAM: MRI LUMBAR SPINE WITHOUT CONTRAST TECHNIQUE: Multiplanar, multisequence MR imaging of the lumbar spine was performed. No intravenous contrast was administered. COMPARISON:  MRI 07/29/2016 FINDINGS: Segmentation:  Standard. Alignment:  Physiologic. Vertebrae: No fracture, evidence of discitis, or suspicious bone lesion. T1/T2 hyperintense lesion within the right side of the L3 vertebral body with stippled appearance on axial view most compatible with intraosseous hemangioma. No bony expansion or extraosseous soft tissue component. Additional smaller probable intraosseous hemangiomas within the L1 and T11 vertebral bodies. Conus medullaris and cauda equina: Conus extends to the L1-2 level. Conus and cauda equina appear normal. Paraspinal and other soft tissues: Retroperitoneal aortocaval soft tissue mass anteriorly displacing the IVC measuring approximately 7.0 x 2.8 x 4.7 cm (series 8, image 9; series 5, image 5). This is new from the prior MRI. Additional adjacent mildly enlarged para-aortic lymph nodes. Disc levels: T12-L1: Negative. L1-L2: Mild broad-based disc bulge. No  foraminal or canal stenosis. Unchanged. L2-L3: Mild broad-based disc bulge. No foraminal or canal stenosis. Unchanged. L3-L4: Mild broad-based disc bulge. No canal stenosis. Borderline-mild bilateral foraminal stenosis. Unchanged. L4-L5: Mild broad-based disc bulge. Mild bilateral facet hypertrophy. Mild bilateral subarticular recess stenosis, left worse than right, without canal stenosis. Mild-to-moderate left and borderline-mild right foraminal stenosis. Unchanged. L5-S1: Shallow central disc protrusion. Mild bilateral facet hypertrophy. No foraminal or canal stenosis. Unchanged. IMPRESSION: 1. Mild multilevel lumbar spondylosis of the lumbar spine without interval progression compared to previous MRI 07/29/2016. 2. Mild-to-moderate left and borderline-mild right foraminal stenosis at L4-L5. 3. Retroperitoneal soft tissue mass anteriorly displacing the IVC measuring up to 7 cm with adjacent mildly enlarged para-aortic lymph nodes. Findings are highly suspicious for lymphoma or metastatic disease. Contrast-enhanced CT of the chest, abdomen, and pelvis is recommended for further evaluation. Ordering provider has been paged. Documentation of the communication of the above findings will be added to the report in the form of an addendum. Electronically Signed: By: Davina Poke D.O. On: 04/10/2020 10:07   NM PET Image Initial (PI) Skull Base To Thigh  Result Date: 04/14/2020 CLINICAL DATA:  Initial treatment strategy for retroperitoneal mass. EXAM: NUCLEAR MEDICINE PET SKULL BASE TO THIGH TECHNIQUE: 8.2 mCi F-18 FDG was injected intravenously. Full-ring PET imaging was performed from the skull base to thigh after the radiotracer. CT data was obtained and used for attenuation correction and anatomic localization. Fasting blood glucose: 90 mg/dl COMPARISON:  Lumbar spine MRI 04/09/2020 FINDINGS: Mediastinal blood pool activity: SUV max 2.25 Liver activity: SUV max 3.05 NECK: No neck mass or lymphadenopathy. There  are bilateral thyroid nodules. The 14 mm nodule in the left lobe inferiorly is not hypermetabolic but there is a 18 mm nodule more inferiorly which is hypermetabolic with SUV max of 9.79. This is suspicious for thyroid gland neoplasm. Recommend thyroid US and biopsy (ref: J Am Coll Radiol. 2015 Feb;12(2): 143-50). Incidental CT findings: none CHEST: No hypermetabolic breast lesions are identified. There are small scattered bilateral axillary lymph nodes. The 10 mm node the lower left axilla on image number 62 is hypermetabolic with SUV max of 8.92. No enlarged or hypermetabolic mediastinal or hilar lymph nodes. No worrisome pulmonary lesions are identified. Incidental CT findings: None ABDOMEN/PELVIS: The right-sided retroperitoneal mass measures approximately 4.8 x 3.0 cm and is hypermetabolic with SUV max of 1.19. 10 mm left para-aortic lymph node on image 417/4 is hypermetabolic with SUV max of 0.81. There are also enlarged and hypermetabolic mesenteric lymph  nodes. 16 mm node on image number 158/3 is hypermetabolic with SUV max of 0.94. No pelvic or inguinal lymphadenopathy. Incidental CT findings: Surgical changes from gastric bypass surgery are noted. Gallbladder is surgically absent. SKELETON: No findings suspicious for osseous metastatic disease. Incidental CT findings: none IMPRESSION: 1. Hypermetabolic right-sided retroperitoneal nodal mass along with hypermetabolic mesenteric adenopathy. Lymphoproliferative disease such as lymphoma is certainly a strong possibility. With history of breast cancer that would also be a consideration. Tissue diagnosis of the retroperitoneal mass may be the most useful. Recommend interventional radiology consultation. 2. There are small bilateral axillary lymph nodes and biopsy of the most hypermetabolic lower left axillary node may also be a considered and would be safer/easier. 3. Hypermetabolic lower left thyroid nodule is certainly worrisome for thyroid cancer but I think  it is unlikely this is metastatic thyroid cancer in the abdomen. Thyroid biopsy is indicated however. 4. No hypermetabolic breast masses and no evidence of pulmonary lesions or pulmonary metastatic disease. Electronically Signed   By: Marijo Sanes M.D.   On: 04/14/2020 09:27    ASSESSMENT: Retroperitoneal lymphadenopathy.  PLAN:    1.  Retroperitoneal lymphadenopathy: PET scan on April 14, 2020 revealed a hypermetabolic right sided retroperitoneal nodal mass along with hypermetabolic mesenteric adenopathy.  Patient also noted to have bilateral axillary adenopathy.  This is highly suspicious for underlying density, possibly lymphoma.  Peripheral blood flow cytometry was ordered today for completeness.  Patient will have CT-guided biopsy of retroperitoneal nodal mass for diagnosis.  Return to clinic 1 week after biopsy to discuss the results. 2.  Hypermetabolic thyroid nodule: Possibly a second primary.  This will require ultrasound and biopsy in the near future as well.  I spent a total of 60 minutes reviewing chart data, face-to-face evaluation with the patient, counseling and coordination of care as detailed above.   Patient expressed understanding and was in agreement with this plan. She also understands that She can call clinic at any time with any questions, concerns, or complaints.   Cancer Staging No matching staging information was found for the patient.  [name], MD   04/15/2020 4:25 PM

## 2020-04-16 ENCOUNTER — Telehealth: Payer: Self-pay | Admitting: *Deleted

## 2020-04-16 NOTE — Telephone Encounter (Addendum)
Called to tell pt that her appt for bx 3/[facility], NPO for 8 hours, come with driver. And see finnegan 3/29 at 11:15 in cancer center. Pt. Told Ronnald Collum that she saw the bx appt and all instructions in my chart and she wrote down the appt for finnegan and it will show up in my chart also. Pt good with this

## 2020-04-17 LAB — COMP PANEL: LEUKEMIA/LYMPHOMA

## 2020-04-20 ENCOUNTER — Other Ambulatory Visit: Payer: Self-pay | Admitting: Student

## 2020-04-20 NOTE — Telephone Encounter (Signed)
Called [facility] to clarify problem with co-pay card. Gave information from card that patient brought in, but it was still denied. Billing representative to look into case and call this RN back.

## 2020-04-21 ENCOUNTER — Ambulatory Visit
Admission: RE | Admit: 2020-04-21 | Discharge: 2020-04-21 | Disposition: A | Discharge status: Home / Self Care | Payer: BC Managed Care – PPO | Source: Ambulatory Visit | Attending: Oncology | Admitting: Oncology

## 2020-04-21 ENCOUNTER — Other Ambulatory Visit: Payer: Self-pay

## 2020-04-21 DIAGNOSIS — R948 Abnormal results of function studies of other organs and systems: Secondary | ICD-10-CM | POA: Diagnosis not present

## 2020-04-21 DIAGNOSIS — R1900 Intra-abdominal and pelvic swelling, mass and lump, unspecified site: Secondary | ICD-10-CM | POA: Diagnosis not present

## 2020-04-21 DIAGNOSIS — R590 Localized enlarged lymph nodes: Secondary | ICD-10-CM | POA: Diagnosis not present

## 2020-04-21 DIAGNOSIS — R1909 Other intra-abdominal and pelvic swelling, mass and lump: Secondary | ICD-10-CM | POA: Diagnosis not present

## 2020-04-21 MED ORDER — MIDAZOLAM HCL 2 MG/2ML IJ SOLN
INTRAMUSCULAR | Status: AC | PRN
  Administered 2020-04-21 (×2): 1 mg via INTRAVENOUS

## 2020-04-21 MED ORDER — FENTANYL CITRATE (PF) 100 MCG/2ML IJ SOLN
INTRAMUSCULAR | Status: AC
  Filled 2020-04-21: qty 2

## 2020-04-21 MED ORDER — FENTANYL CITRATE (PF) 100 MCG/2ML IJ SOLN
INTRAMUSCULAR | Status: AC | PRN
  Administered 2020-04-21 (×2): 50 ug via INTRAVENOUS

## 2020-04-21 MED ORDER — SODIUM CHLORIDE 0.9 % IV SOLN: INTRAVENOUS | Status: DC

## 2020-04-21 MED ORDER — MIDAZOLAM HCL 2 MG/2ML IJ SOLN
INTRAMUSCULAR | Status: AC
  Filled 2020-04-21: qty 2

## 2020-04-21 NOTE — Procedures (Signed)
  Procedure: CT core R retroperitoneal mass   EBL:   minimal Complications:  none immediate  See full dictation in [facility].  Dillard Cannon MD Main # [phone] Pager  [phone] Mobile [phone]

## 2020-04-21 NOTE — H&P (Signed)
Chief Complaint: Patient was seen in consultation today for retroperitoneal mass biopsy.   Referring Physician(s): [name]  Supervising Physician: Arne Cleveland  Patient Status: [facility] - Out-pt  History of Present Illness: Melinda Hall is a 60 y.o. female with a medical history significant for obesity s/p roux-en-y (2004), left breast cancer s/p excision (2019) and a recently discovered retroperitoneal mass highly suspicious for malignancy. She presented to her PCP mid-January with complaints of worsening lower back pain and left lower leg numbness. Imaging was obtained.   MR Lumbar Spine 04/10/20 Paraspinal and other soft tissues: Retroperitoneal aortocaval soft tissue mass anteriorly displacing the IVC measuring approximately 7.0 x 2.8 x 4.7 cm (series 8, image 9; series 5, image 5). This is new from the prior MRI. Additional adjacent mildly enlarged para-aortic lymph nodes.  NM Pet 04/14/20 IMPRESSION: 1. Hypermetabolic right-sided retroperitoneal nodal mass along with hypermetabolic mesenteric adenopathy. Lymphoproliferative disease such as lymphoma is certainly a strong possibility. With history of breast cancer that would also be a consideration. Tissue diagnosis of the retroperitoneal mass may be the most useful. Recommend interventional radiology consultation. 2. There are small bilateral axillary lymph nodes and biopsy of the most hypermetabolic lower left axillary node may also be a considered and would be safer/easier. 3. Hypermetabolic lower left thyroid nodule is certainly worrisome for thyroid cancer but I think it is unlikely this is metastatic thyroid cancer in the abdomen. Thyroid biopsy is indicated however. 4. No hypermetabolic breast masses and no evidence of pulmonary lesions or pulmonary metastatic disease.  Interventional Radiology has been asked to evaluate this patient for an image-guided right retroperitoneal mass biopsy. This case was  reviewed and procedure approved by Dr. Vernard Gambles.   Past Medical History:  Diagnosis Date  . Dermoid tumor    hx of--left ovary  . Gastric bypass status for obesity 2004   s/p roux-en-y  . Generalized headaches    frequent  . Heart murmur   . History of cardiac murmur   . Intraductal papilloma of breast, left 06/18/2017   05/2017 - s/p excision 07/2017  . Osteoporosis 03/2016   DEXA -2.6 spine  . Salivary gland stone    x 2  . Smoker     Past Surgical History:  Procedure Laterality Date  . ABDOMINAL HYSTERECTOMY     only ovary was removed  . ANTERIOR CERVICAL DECOMP/DISCECTOMY FUSION  12/2016   Pool  . APPENDECTOMY  1980  . BILATERAL OOPHORECTOMY  ~1992   for dermoid ovarian cysts  . BREAST BIOPSY Left 06/13/2017   papilloma with excision 07/2017  . BREAST LUMPECTOMY WITH NEEDLE LOCALIZATION Left 08/10/2017   intraductal papilloma - Jules Husbands, MD  . CHOLECYSTECTOMY  2000s  . COLONOSCOPY WITH PROPOFOL N/A 11/27/2015   TA rpt 5 yrs Lucilla Lame, MD)  . GASTRIC BYPASS  2004   roux en y [facility])  . POLYPECTOMY  11/27/2015   Procedure: POLYPECTOMY;  Surgeon: Lucilla Lame, MD;  Location: [place];  Service: Endoscopy;;  . REDUCTION MAMMAPLASTY Bilateral 1990's  . SALIVARY GLAND SURGERY    . SALIVARY STONE REMOVAL      Allergies: Boniva [ibandronic acid]  Medications: Prior to Admission medications   Medication Sig Start Date End Date Taking? Authorizing Provider  ascorbic acid 1000 MG tablet Take 1 tablet (1,000 mg total) by mouth daily. 02/25/20   [name], MD  calcium carbonate (OS-CAL) 600 MG TABS tablet Take 600 mg by mouth daily with breakfast.  [provider]  Cholecalciferol (VITAMIN D3) 1000 units CAPS Take 3 capsules (3,000 Units total) by mouth daily. 10/19/16   [name], MD  denosumab (PROLIA) 60 MG/ML SOSY injection Inject 60 mg into the skin every 6 (six) months. 04/01/20   [name], MD  ferrous sulfate 325 (65  FE) MG tablet Take 1 tablet (325 mg total) by mouth every Monday, Wednesday, and Friday. 12/05/18   [name], MD  Magnesium 250 MG TABS Take 1 tablet (250 mg total) by mouth daily. 02/25/20   [name], MD  methocarbamol (ROBAXIN) 500 MG tablet Take 1 tablet (500 mg total) by mouth 3 (three) times daily as needed for muscle spasms (sedation precautions). 09/11/19   [name], MD  vitamin B-12 (CYANOCOBALAMIN) 1000 MCG tablet Take 1 tablet (1,000 mcg total) by mouth daily. 10/22/16   [name], MD  zinc gluconate 50 MG tablet Take 1 tablet (50 mg total) by mouth daily. 02/25/20   [name], MD     Family History  Problem Relation Age of Onset  . Cancer Father 61       leukemia  . Hypertension Mother   . Diabetes Neg Hx   . Stroke Neg Hx   . Coronary artery disease Neg Hx   . Breast cancer Neg Hx     Social History   Socioeconomic History  . Marital status: Married    Spouse name: Not on file  . Number of children: Not on file  . Years of education: Not on file  . Highest education level: Not on file  Occupational History  . Not on file  Tobacco Use  . Smoking status: Former Smoker    Packs/day: 0.25    Years: 20.00    Pack years: 5.00    Types: Cigarettes    Quit date: 10/31/2012    Years since quitting: 7.4  . Smokeless tobacco: Never Used  Vaping Use  . Vaping Use: Never used  Substance and Sexual Activity  . Alcohol use: Not Currently  . Drug use: No  . Sexual activity: Not on file  Other Topics Concern  . 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  All other systems are negative.  Review of Systems  Constitutional: Negative for appetite change and fever.  Respiratory: Negative for cough and shortness of breath.   Cardiovascular: Negative for chest pain and leg swelling.  Gastrointestinal: Negative for abdominal pain, diarrhea, nausea and vomiting.  Neurological: Positive for numbness. Negative for dizziness and headaches.       Intermittent left lower extremity numbness and tingling    Vital Signs: BP 124/88   Pulse 78   Temp 98.4 F (36.9 C) (Oral)   Resp 20   Ht 5\' 5"  (1.651 m)   Wt 160 lb (72.6 kg)   SpO2 98%   BMI 26.63 kg/m  Physical Exam Constitutional:      General: She is not in acute distress. HENT:     Mouth/Throat:     Mouth: Mucous membranes are moist.     Pharynx: Oropharynx is clear.  Cardiovascular:     Rate and Rhythm: Normal rate and regular rhythm.     Pulses: Normal pulses.     Heart sounds: Normal heart sounds.  Pulmonary:     Effort: Pulmonary effort is normal.     Breath sounds: Normal breath sounds.  Abdominal:     General: Bowel sounds are normal.     Palpations: Abdomen is soft.  Musculoskeletal:        General: Normal range of motion.     Cervical back: Normal range of motion.     Right lower leg: No edema.     Left lower leg: No edema.  Skin:    General: Skin is warm and dry.  Neurological:     Mental Status: She is alert and oriented to person, place, and time.  Psychiatric:        Mood and Affect: Mood normal.        Behavior: Behavior normal.        Thought Content: Thought content normal.        Judgment: Judgment normal.     Imaging: MR Lumbar Spine Wo Contrast  Addendum Date: 04/10/2020   ADDENDUM REPORT: 04/10/2020 10:11 ADDENDUM: These results were discussed by telephone on  04/10/2020 at 10:11 am with provider [name] , who verbally acknowledged these results. Electronically Signed   By: Davina Poke D.O.   On: 04/10/2020 10:11   Result Date: 04/10/2020 CLINICAL DATA:  Low back pain with left leg numbness for 2 years EXAM: MRI LUMBAR SPINE WITHOUT CONTRAST TECHNIQUE: Multiplanar, multisequence MR imaging of the lumbar spine was performed. No intravenous contrast was administered. COMPARISON:  MRI 07/29/2016 FINDINGS: Segmentation:  Standard. Alignment:  Physiologic. Vertebrae: No fracture, evidence of discitis, or suspicious bone lesion. T1/T2 hyperintense lesion within the right side of the L3 vertebral body with stippled appearance on axial view most compatible with intraosseous hemangioma. No bony expansion or extraosseous soft tissue component. Additional smaller probable intraosseous hemangiomas within the L1 and T11 vertebral bodies. Conus medullaris and cauda equina: Conus extends to the L1-2 level. Conus and cauda equina appear normal. Paraspinal and other soft tissues: Retroperitoneal aortocaval soft tissue mass anteriorly displacing the IVC measuring approximately 7.0 x 2.8 x 4.7 cm (series 8, image 9; series 5, image 5). This is new from the prior MRI. Additional adjacent mildly enlarged para-aortic lymph nodes. Disc levels: T12-L1: Negative. L1-L2: Mild broad-based disc bulge. No foraminal or canal stenosis. Unchanged. L2-L3: Mild broad-based disc bulge. No foraminal or canal stenosis. Unchanged. L3-L4: Mild broad-based disc bulge. No canal stenosis. Borderline-mild bilateral foraminal stenosis. Unchanged. L4-L5: Mild broad-based disc bulge. Mild bilateral facet hypertrophy. Mild bilateral subarticular recess stenosis, left worse than right, without canal stenosis. Mild-to-moderate left and borderline-mild right foraminal stenosis. Unchanged. L5-S1: Shallow central disc protrusion. Mild bilateral facet hypertrophy. No foraminal or canal stenosis. Unchanged.  IMPRESSION: 1. Mild multilevel lumbar spondylosis of the lumbar spine without interval progression compared to previous MRI 07/29/2016. 2. Mild-to-moderate left and borderline-mild right foraminal stenosis at L4-L5. 3. Retroperitoneal soft tissue mass anteriorly displacing the IVC measuring up to 7 cm with adjacent mildly enlarged para-aortic lymph nodes. Findings are highly suspicious for lymphoma or metastatic disease. Contrast-enhanced CT of the chest, abdomen, and pelvis is recommended for further  evaluation. Ordering provider has been paged. Documentation of the communication of the above findings will be added to the report in the form of an addendum. Electronically Signed: By: Davina Poke D.O. On: 04/10/2020 10:07   NM PET Image Initial (PI) Skull Base To Thigh  Result Date: 04/14/2020 CLINICAL DATA:  Initial treatment strategy for retroperitoneal mass. EXAM: NUCLEAR MEDICINE PET SKULL BASE TO THIGH TECHNIQUE: 8.2 mCi F-18 FDG was injected intravenously. Full-ring PET imaging was performed from the skull base to thigh after the radiotracer. CT data was obtained and used for attenuation correction and anatomic localization. Fasting blood glucose: 90 mg/dl COMPARISON:  Lumbar spine MRI 04/09/2020 FINDINGS: Mediastinal blood pool activity: SUV max 2.25 Liver activity: SUV max 3.05 NECK: No neck mass or lymphadenopathy. There are bilateral thyroid nodules. The 14 mm nodule in the left lobe inferiorly is not hypermetabolic but there is a 18 mm nodule more inferiorly which is hypermetabolic with SUV max of 7.98. This is suspicious for thyroid gland neoplasm. Recommend thyroid US and biopsy (ref: J Am Coll Radiol. 2015 Feb;12(2): 143-50). Incidental CT findings: none CHEST: No hypermetabolic breast lesions are identified. There are small scattered bilateral axillary lymph nodes. The 10 mm node the lower left axilla on image number 62 is hypermetabolic with SUV max of 9.21. No enlarged or hypermetabolic  mediastinal or hilar lymph nodes. No worrisome pulmonary lesions are identified. Incidental CT findings: None ABDOMEN/PELVIS: The right-sided retroperitoneal mass measures approximately 4.8 x 3.0 cm and is hypermetabolic with SUV max of 1.94. 10 mm left para-aortic lymph node on image 174/0 is hypermetabolic with SUV max of 8.14. There are also enlarged and hypermetabolic mesenteric lymph nodes. 16 mm node on image number 481/8 is hypermetabolic with SUV max of 5.63. No pelvic or inguinal lymphadenopathy. Incidental CT findings: Surgical changes from gastric bypass surgery are noted. Gallbladder is surgically absent. SKELETON: No findings suspicious for osseous metastatic disease. Incidental CT findings: none IMPRESSION: 1. Hypermetabolic right-sided retroperitoneal nodal mass along with hypermetabolic mesenteric adenopathy. Lymphoproliferative disease such as lymphoma is certainly a strong possibility. With history of breast cancer that would also be a consideration. Tissue diagnosis of the retroperitoneal mass may be the most useful. Recommend interventional radiology consultation. 2. There are small bilateral axillary lymph nodes and biopsy of the most hypermetabolic lower left axillary node may also be a considered and would be safer/easier. 3. Hypermetabolic lower left thyroid nodule is certainly worrisome for thyroid cancer but I think it is unlikely this is metastatic thyroid cancer in the abdomen. Thyroid biopsy is indicated however. 4. No hypermetabolic breast masses and no evidence of pulmonary lesions or pulmonary metastatic disease. Electronically Signed   By: Marijo Sanes M.D.   On: 04/14/2020 09:27    Labs:  CBC: Recent Labs    02/25/20 1628 04/15/20 1205  WBC 7.4 6.2  HGB 14.8 15.6*  HCT 43.8 46.9*  PLT 187.0 174    COAGS: No results for input(s): INR, APTT in the last 8760 hours.  BMP: Recent Labs    08/28/19 1542 02/25/20 1628 04/15/20 1205  NA 139 138 140  K 4.3 5.2* 4.8   CL 105 103 103  CO2 29 30 27   GLUCOSE 89 87 87  BUN 19 28* 14  CALCIUM 9.7 10.0 9.4  CREATININE 0.65 0.65 0.56  GFRNONAA  --   --  >60    LIVER FUNCTION TESTS: Recent Labs    02/25/20 1628 04/15/20 1205  BILITOT 0.7 1.2  AST 26  33  ALT 44* 47*  ALKPHOS 58 55  PROT 6.7 7.0  ALBUMIN 4.4 4.3    TUMOR MARKERS: No results for input(s): AFPTM, CEA, CA199, CHROMGRNA in the last 8760 hours.  Assessment and Plan:  Retroperitoneal mass: Melinda Hall, 60 year old female, presents today to the [facility] Interventional Radiology department for an image-guided retroperitoneal mass biopsy. A Spanish interpreter was present for the explanation of the procedure, assessment and consent obtainment.   Risks and benefits of this procedure were discussed with the patient and/or patient's family including, but not limited to bleeding, infection, damage to adjacent structures or low yield requiring additional tests.  All of the questions were answered and there is agreement to proceed. She has been NPO.   Consent signed and in chart.  Thank you for this interesting consult.  I greatly enjoyed meeting Melinda Hall and look forward to participating in their care.  A copy of this report was sent to the requesting provider on this date.  Electronically Signed: Soyla Dryer, AGACNP-BC [phone] 04/21/2020, 9:29 AM   I spent a total of  30 Minutes   in face to face in clinical consultation, greater than 50% of which was counseling/coordinating care for retroperitoneal mass biopsy.

## 2020-04-21 NOTE — Progress Notes (Signed)
Patient clinically stable post Retroperitoneal LN biopsy per Dr Vernard Gambles, tolerated well. Received Versed 2 mg along with Fentanyl 100 mcg IV for procedure. Daughter to bedside post procedure with update given. Awake/alert and oriented post procedure. Report given to Fransico Michael Rn post procedure/specails.denies complaints at this time.

## 2020-04-23 ENCOUNTER — Encounter: Payer: Self-pay | Admitting: Oncology

## 2020-04-23 NOTE — Progress Notes (Signed)
[place]  [phone] [phone]  ID: Melinda Hall OB: July 19, 1960  MR#: [id number]  AOZ#:[id number]  Patient Care Team: [name], MD as PCP - General (Family Medicine)  CHIEF COMPLAINT: At least stage III follicular lymphoma, grade 1/2.    INTERVAL HISTORY: Patient returns to clinic today for further evaluation, discussion of her biopsy results, and treatment planning.  She is anxious, but otherwise feels well.  She has no neurologic complaints.  She denies any recent fevers or illnesses.  She denies any night sweats.  She has no chest pain, shortness of breath, cough, or hemoptysis.  She denies any nausea, vomiting, constipation, or diarrhea.  She has no urinary complaints.  Patient offers no further specific complaints today.  REVIEW OF SYSTEMS:   Review of Systems  Constitutional: Negative.  Negative for fever, malaise/fatigue and weight loss.  Respiratory: Negative.  Negative for cough, hemoptysis and shortness of breath.   Cardiovascular: Negative.  Negative for chest pain and leg swelling.  Gastrointestinal: Negative.  Negative for abdominal pain.  Genitourinary: Negative.  Negative for dysuria.  Musculoskeletal: Negative.  Negative for back pain.  Skin: Negative.  Negative for rash.  Neurological: Negative.  Negative for tingling, sensory change, focal weakness, weakness and headaches.  Psychiatric/Behavioral: The patient is nervous/anxious.     As per HPI. Otherwise, a complete review of systems is negative.  PAST MEDICAL HISTORY: Past Medical History:  Diagnosis Date  . Dermoid tumor    hx of--left ovary  . Gastric bypass status for obesity 2004   s/p roux-en-y  . Generalized headaches    frequent  . Heart murmur   . History of cardiac murmur   . Intraductal papilloma of breast, left 06/18/2017   05/2017 - s/p excision 07/2017  . Osteoporosis 03/2016   DEXA -2.6 spine  . Salivary gland stone    x 2  . Smoker      PAST SURGICAL HISTORY: Past Surgical History:  Procedure Laterality Date  . ABDOMINAL HYSTERECTOMY     only ovary was removed  . ANTERIOR CERVICAL DECOMP/DISCECTOMY FUSION  12/2016   Pool  . APPENDECTOMY  1980  . BILATERAL OOPHORECTOMY  ~1992   for dermoid ovarian cysts  . BREAST BIOPSY Left 06/13/2017   papilloma with excision 07/2017  . BREAST LUMPECTOMY WITH NEEDLE LOCALIZATION Left 08/10/2017   intraductal papilloma - Jules Husbands, MD  . CHOLECYSTECTOMY  2000s  . COLONOSCOPY WITH PROPOFOL N/A 11/27/2015   TA rpt 5 yrs Lucilla Lame, MD)  . GASTRIC BYPASS  2004   roux en y [facility])  . POLYPECTOMY  11/27/2015   Procedure: POLYPECTOMY;  Surgeon: Lucilla Lame, MD;  Location: [place];  Service: Endoscopy;;  . REDUCTION MAMMAPLASTY Bilateral 1990's  . SALIVARY GLAND SURGERY    . SALIVARY STONE REMOVAL      FAMILY HISTORY: Family History  Problem Relation Age of Onset  . Cancer Father 2       leukemia  . Hypertension Mother   . Diabetes Neg Hx   . Stroke Neg Hx   . Coronary artery disease Neg Hx   . Breast cancer Neg Hx     ADVANCED DIRECTIVES (Y/N):  N  HEALTH MAINTENANCE: Social History   Tobacco Use  . Smoking status: Former Smoker    Packs/day: 0.25    Years: 20.00    Pack years: 5.00    Types: Cigarettes    Quit date: 10/31/2012    Years since quitting: 7.4  .  Smokeless tobacco: Never Used  Vaping Use  . Vaping Use: Never used  Substance Use Topics  . Alcohol use: Not Currently  . Drug use: No     Colonoscopy:  PAP:  Bone density:  Lipid panel:  Allergies  Allergen Reactions  . Boniva [Ibandronic Acid] Other (See Comments)    Diffuse body aches    Current Outpatient Medications  Medication Sig Dispense Refill  . ascorbic acid 1000 MG tablet Take 1 tablet (1,000 mg total) by mouth daily.    . calcium carbonate (OS-CAL) 600 MG TABS tablet Take 600 mg by mouth daily with breakfast.    . Cholecalciferol (VITAMIN D3) 1000 units  CAPS Take 3 capsules (3,000 Units total) by mouth daily.    Marland Kitchen denosumab (PROLIA) 60 MG/ML SOSY injection Inject 60 mg into the skin every 6 (six) months. 1 mL 1  . ferrous sulfate 325 (65 FE) MG tablet Take 1 tablet (325 mg total) by mouth every Monday, Wednesday, and Friday.    . Magnesium 250 MG TABS Take 1 tablet (250 mg total) by mouth daily.  0  . methocarbamol (ROBAXIN) 500 MG tablet Take 1 tablet (500 mg total) by mouth 3 (three) times daily as needed for muscle spasms (sedation precautions). 40 tablet 1  . vitamin B-12 (CYANOCOBALAMIN) 1000 MCG tablet Take 1 tablet (1,000 mcg total) by mouth daily.    Marland Kitchen zinc gluconate 50 MG tablet Take 1 tablet (50 mg total) by mouth daily.     No current facility-administered medications for this visit.    OBJECTIVE: Vitals:   04/28/20 1124  BP: (!) 143/92  Pulse: 60  Resp: 20  Temp: 97.8 F (36.6 C)  SpO2: 100%     Body mass index is 28.12 kg/m.    ECOG FS:0 - Asymptomatic  General: Well-developed, well-nourished, no acute distress. Eyes: Pink conjunctiva, anicteric sclera. HEENT: Normocephalic, moist mucous membranes. Lungs: No audible wheezing or coughing. Heart: Regular rate and rhythm. Abdomen: Soft, nontender, no obvious distention. Musculoskeletal: No edema, cyanosis, or clubbing. Neuro: Alert, answering all questions appropriately. Cranial nerves grossly intact. Skin: No rashes or petechiae noted. Psych: Normal affect. Lymphatics: No palpable lymphadenopathy.   LAB RESULTS:  Lab Results  Component Value Date   NA 140 04/15/2020   K 4.8 04/15/2020   CL 103 04/15/2020   CO2 27 04/15/2020   GLUCOSE 87 04/15/2020   BUN 14 04/15/2020   CREATININE 0.56 04/15/2020   CALCIUM 9.4 04/15/2020   PROT 7.0 04/15/2020   ALBUMIN 4.3 04/15/2020   AST 33 04/15/2020   ALT 47 (H) 04/15/2020   ALKPHOS 55 04/15/2020   BILITOT 1.2 04/15/2020   GFRNONAA >60 04/15/2020   GFRAA >60 07/10/2013    Lab Results  Component Value Date    WBC 6.2 04/15/2020   NEUTROABS 3.8 04/15/2020   HGB 15.6 (H) 04/15/2020   HCT 46.9 (H) 04/15/2020   MCV 93.2 04/15/2020   PLT 174 04/15/2020     STUDIES: MR Lumbar Spine Wo Contrast  Addendum Date: 04/10/2020   ADDENDUM REPORT: 04/10/2020 10:11 ADDENDUM: These results were discussed by telephone on 04/10/2020 at 10:11 am with provider [name] , who verbally acknowledged these results. Electronically Signed   By: Davina Poke D.O.   On: 04/10/2020 10:11   Result Date: 04/10/2020 CLINICAL DATA:  Low back pain with left leg numbness for 2 years EXAM: MRI LUMBAR SPINE WITHOUT CONTRAST TECHNIQUE: Multiplanar, multisequence MR imaging of the lumbar spine was performed. No intravenous  contrast was administered. COMPARISON:  MRI 07/29/2016 FINDINGS: Segmentation:  Standard. Alignment:  Physiologic. Vertebrae: No fracture, evidence of discitis, or suspicious bone lesion. T1/T2 hyperintense lesion within the right side of the L3 vertebral body with stippled appearance on axial view most compatible with intraosseous hemangioma. No bony expansion or extraosseous soft tissue component. Additional smaller probable intraosseous hemangiomas within the L1 and T11 vertebral bodies. Conus medullaris and cauda equina: Conus extends to the L1-2 level. Conus and cauda equina appear normal. Paraspinal and other soft tissues: Retroperitoneal aortocaval soft tissue mass anteriorly displacing the IVC measuring approximately 7.0 x 2.8 x 4.7 cm (series 8, image 9; series 5, image 5). This is new from the prior MRI. Additional adjacent mildly enlarged para-aortic lymph nodes. Disc levels: T12-L1: Negative. L1-L2: Mild broad-based disc bulge. No foraminal or canal stenosis. Unchanged. L2-L3: Mild broad-based disc bulge. No foraminal or canal stenosis. Unchanged. L3-L4: Mild broad-based disc bulge. No canal stenosis. Borderline-mild bilateral foraminal stenosis. Unchanged. L4-L5: Mild broad-based disc bulge. Mild  bilateral facet hypertrophy. Mild bilateral subarticular recess stenosis, left worse than right, without canal stenosis. Mild-to-moderate left and borderline-mild right foraminal stenosis. Unchanged. L5-S1: Shallow central disc protrusion. Mild bilateral facet hypertrophy. No foraminal or canal stenosis. Unchanged. IMPRESSION: 1. Mild multilevel lumbar spondylosis of the lumbar spine without interval progression compared to previous MRI 07/29/2016. 2. Mild-to-moderate left and borderline-mild right foraminal stenosis at L4-L5. 3. Retroperitoneal soft tissue mass anteriorly displacing the IVC measuring up to 7 cm with adjacent mildly enlarged para-aortic lymph nodes. Findings are highly suspicious for lymphoma or metastatic disease. Contrast-enhanced CT of the chest, abdomen, and pelvis is recommended for further evaluation. Ordering provider has been paged. Documentation of the communication of the above findings will be added to the report in the form of an addendum. Electronically Signed: By: Davina Poke D.O. On: 04/10/2020 10:07   NM PET Image Initial (PI) Skull Base To Thigh  Result Date: 04/14/2020 CLINICAL DATA:  Initial treatment strategy for retroperitoneal mass. EXAM: NUCLEAR MEDICINE PET SKULL BASE TO THIGH TECHNIQUE: 8.2 mCi F-18 FDG was injected intravenously. Full-ring PET imaging was performed from the skull base to thigh after the radiotracer. CT data was obtained and used for attenuation correction and anatomic localization. Fasting blood glucose: 90 mg/dl COMPARISON:  Lumbar spine MRI 04/09/2020 FINDINGS: Mediastinal blood pool activity: SUV max 2.25 Liver activity: SUV max 3.05 NECK: No neck mass or lymphadenopathy. There are bilateral thyroid nodules. The 14 mm nodule in the left lobe inferiorly is not hypermetabolic but there is a 18 mm nodule more inferiorly which is hypermetabolic with SUV max of 4.92. This is suspicious for thyroid gland neoplasm. Recommend thyroid US and biopsy (ref:  J Am Coll Radiol. 2015 Feb;12(2): 143-50). Incidental CT findings: none CHEST: No hypermetabolic breast lesions are identified. There are small scattered bilateral axillary lymph nodes. The 10 mm node the lower left axilla on image number 62 is hypermetabolic with SUV max of 0.10. No enlarged or hypermetabolic mediastinal or hilar lymph nodes. No worrisome pulmonary lesions are identified. Incidental CT findings: None ABDOMEN/PELVIS: The right-sided retroperitoneal mass measures approximately 4.8 x 3.0 cm and is hypermetabolic with SUV max of 0.71. 10 mm left para-aortic lymph node on image 219/7 is hypermetabolic with SUV max of 5.88. There are also enlarged and hypermetabolic mesenteric lymph nodes. 16 mm node on image number 325/4 is hypermetabolic with SUV max of 9.82. No pelvic or inguinal lymphadenopathy. Incidental CT findings: Surgical changes from gastric bypass surgery are noted.  Gallbladder is surgically absent. SKELETON: No findings suspicious for osseous metastatic disease. Incidental CT findings: none IMPRESSION: 1. Hypermetabolic right-sided retroperitoneal nodal mass along with hypermetabolic mesenteric adenopathy. Lymphoproliferative disease such as lymphoma is certainly a strong possibility. With history of breast cancer that would also be a consideration. Tissue diagnosis of the retroperitoneal mass may be the most useful. Recommend interventional radiology consultation. 2. There are small bilateral axillary lymph nodes and biopsy of the most hypermetabolic lower left axillary node may also be a considered and would be safer/easier. 3. Hypermetabolic lower left thyroid nodule is certainly worrisome for thyroid cancer but I think it is unlikely this is metastatic thyroid cancer in the abdomen. Thyroid biopsy is indicated however. 4. No hypermetabolic breast masses and no evidence of pulmonary lesions or pulmonary metastatic disease. Electronically Signed   By: Marijo Sanes M.D.   On: 04/14/2020  09:27   CT Biopsy  Result Date: 04/21/2020 CLINICAL DATA:  Right retroperitoneal mass. EXAM: CT GUIDED CORE BIOPSY OF RETROPERITONEAL MASS ANESTHESIA/SEDATION: Intravenous Fentanyl 135mg and Versed 251mwere administered as conscious sedation during continuous monitoring of the patient's level of consciousness and physiological / cardiorespiratory status by the radiology RN, with a total moderate sedation time of 13 minutes. PROCEDURE: The procedure risks, benefits, and alternatives were explained to the patient. Questions regarding the procedure were encouraged and answered. The patient understands and consents to the procedure. Patient placed prone. Select axial scans through the lower abdomen obtained. The right retroperitoneal mass was localized and an appropriate skin entry site was determined and marked. The operative field was prepped with chlorhexidinein a sterile fashion, and a sterile drape was applied covering the operative field. A sterile gown and sterile gloves were used for the procedure. Local anesthesia was provided with 1% Lidocaine. Under CT fluoroscopic guidance, a 17 gauge trocar needle was advanced to the margin of the lesion. Once needle tip position was confirmed, coaxial 18-gauge core biopsy samples were obtained, submitted in saline to surgical pathology. The guide needle was removed. Postprocedure scans show minimal regional retroperitoneal hemorrhage. Patient remained asymptomatic and stable. The patient tolerated the procedure well. COMPLICATIONS: None immediate FINDINGS: Right retroperitoneal mass was localized. Representative core biopsy samples obtained as above. IMPRESSION: 1. Technically successful CT-guided right retroperitoneal mass core biopsy Electronically Signed   By: D Lucrezia Europe.D.   On: 04/21/2020 10:34    ASSESSMENT: At least stage III follicular lymphoma, grade 1/2.    PLAN:    1.  At least stage III follicular lymphoma, grade 1/2: Retroperitoneal lymph node  biopsy on April 21, 2020 confirmed the diagnosis. PET scan on April 14, 2020 revealed a hypermetabolic right sided retroperitoneal nodal mass along with hypermetabolic mesenteric adenopathy.  Patient also noted to have bilateral axillary adenopathy.  Although patient is asymptomatic and active surveillance is an option, she wishes to pursue treatment and will receive IV Rituxan weekly x4.  We discussed the possibility of doing a bone marrow biopsy, but patient does not wish to pursue and likely would not change the overall treatment plan or prognosis.  Return to clinic next week for cycle 1 of 4 of Rituxan. 2.  Hypermetabolic thyroid nodule: Possibly a second primary.  Ultrasound is scheduled later this week.  Patient will likely require biopsy.  I spent a total of 30 minutes reviewing chart data, face-to-face evaluation with the patient, counseling and coordination of care as detailed above.   Patient expressed understanding and was in agreement with this plan. She also understands  that She can call clinic at any time with any questions, concerns, or complaints.   Cancer Staging Follicular lymphoma ([place]) Staging form: Hodgkin and Non-Hodgkin Lymphoma, AJCC 8th Edition - Clinical stage from 1/88/4166: Stage III (Follicular lymphoma) - Signed by [name], MD on 04/29/2020 Stage prefix: Initial diagnosis   [name], MD   04/29/2020 9:46 AM

## 2020-04-24 LAB — SURGICAL PATHOLOGY

## 2020-04-27 NOTE — Telephone Encounter (Signed)
Spoke with [place], billing representative who stated that there was no need to worry about the denial of the card at this moment because the next dose is not due until September.

## 2020-04-28 ENCOUNTER — Other Ambulatory Visit: Payer: Self-pay

## 2020-04-28 ENCOUNTER — Inpatient Hospital Stay (HOSPITAL_BASED_OUTPATIENT_CLINIC_OR_DEPARTMENT_OTHER): Payer: BC Managed Care – PPO | Admitting: Oncology

## 2020-04-28 VITALS — BP 143/92 | HR 60 | Temp 97.8°F | Resp 20 | Wt 169.0 lb

## 2020-04-28 DIAGNOSIS — Z87891 Personal history of nicotine dependence: Secondary | ICD-10-CM | POA: Diagnosis not present

## 2020-04-28 DIAGNOSIS — Z9884 Bariatric surgery status: Secondary | ICD-10-CM | POA: Diagnosis not present

## 2020-04-28 DIAGNOSIS — C8218 Follicular lymphoma grade II, lymph nodes of multiple sites: Secondary | ICD-10-CM | POA: Diagnosis not present

## 2020-04-28 DIAGNOSIS — E079 Disorder of thyroid, unspecified: Secondary | ICD-10-CM | POA: Diagnosis not present

## 2020-04-28 DIAGNOSIS — R948 Abnormal results of function studies of other organs and systems: Secondary | ICD-10-CM | POA: Diagnosis not present

## 2020-04-28 DIAGNOSIS — R1900 Intra-abdominal and pelvic swelling, mass and lump, unspecified site: Secondary | ICD-10-CM

## 2020-04-28 DIAGNOSIS — C8200 Follicular lymphoma grade I, unspecified site: Secondary | ICD-10-CM | POA: Diagnosis not present

## 2020-04-29 DIAGNOSIS — C8290 Follicular lymphoma, unspecified, unspecified site: Secondary | ICD-10-CM | POA: Insufficient documentation

## 2020-04-29 MED ORDER — PROCHLORPERAZINE MALEATE 10 MG PO TABS: 10.0000 mg | ORAL_TABLET | Freq: Four times a day (QID) | ORAL | 0 refills | Status: DC | PRN

## 2020-04-29 NOTE — Progress Notes (Signed)
START ON PATHWAY REGIMEN - Lymphoma and CLL     Administer weekly:     Rituximab-xxxx   **Always confirm dose/schedule in your pharmacy ordering system**  Patient Characteristics: Follicular Lymphoma, Grades 1, 2, and 3A, First Line, Stage III / IV, Asymptomatic or Low Bulk Disease Disease Type: Follicular Lymphoma, Grade 1, 2, or 3A Disease Type: Not Applicable Disease Type: Not Applicable Line of Therapy: First Line Disease Characteristics: Asymptomatic or Low Bulk Disease Intent of Therapy: Non-Curative / Palliative Intent, Discussed with Patient

## 2020-04-30 NOTE — Progress Notes (Signed)
[place]  [phone] [phone]  ID: Melinda Hall OB: [birth year]  MR#: [id number]  JQD#:[id number]  Patient Care Team: [name], MD as PCP - General (Family Medicine)  CHIEF COMPLAINT: At least stage III follicular lymphoma, grade 1/2.    INTERVAL HISTORY: Patient returns to clinic today for further evaluation and initiation of cycle 1 of 4 weekly Rituxan.  She currently feels well and is asymptomatic. She has no neurologic complaints.  She denies any recent fevers or illnesses.  She denies any night sweats.  She has no chest pain, shortness of breath, cough, or hemoptysis.  She denies any nausea, vomiting, constipation, or diarrhea.  She has no urinary complaints.  Patient feels at her baseline offers no specific complaints today.  REVIEW OF SYSTEMS:   Review of Systems  Constitutional: Negative.  Negative for fever, malaise/fatigue and weight loss.  Respiratory: Negative.  Negative for cough, hemoptysis and shortness of breath.   Cardiovascular: Negative.  Negative for chest pain and leg swelling.  Gastrointestinal: Negative.  Negative for abdominal pain.  Genitourinary: Negative.  Negative for dysuria.  Musculoskeletal: Negative.  Negative for back pain.  Skin: Negative.  Negative for rash.  Neurological: Negative.  Negative for tingling, sensory change, focal weakness, weakness and headaches.  Psychiatric/Behavioral: Negative.  The patient is not nervous/anxious.     As per HPI. Otherwise, a complete review of systems is negative.  PAST MEDICAL HISTORY: Past Medical History:  Diagnosis Date  . Dermoid tumor    hx of--left ovary  . Gastric bypass status for obesity 2004   s/p roux-en-y  . Generalized headaches    frequent  . Heart murmur   . History of cardiac murmur   . Intraductal papilloma of breast, left 06/18/2017   05/2017 - s/p excision 07/2017  . Osteoporosis 03/2016   DEXA -2.6 spine  . Salivary gland stone     x 2  . Smoker     PAST SURGICAL HISTORY: Past Surgical History:  Procedure Laterality Date  . ABDOMINAL HYSTERECTOMY     only ovary was removed  . ANTERIOR CERVICAL DECOMP/DISCECTOMY FUSION  12/2016   Pool  . APPENDECTOMY  1980  . BILATERAL OOPHORECTOMY  ~1992   for dermoid ovarian cysts  . BREAST BIOPSY Left 06/13/2017   papilloma with excision 07/2017  . BREAST LUMPECTOMY WITH NEEDLE LOCALIZATION Left 08/10/2017   intraductal papilloma - Jules Husbands, MD  . CHOLECYSTECTOMY  2000s  . COLONOSCOPY WITH PROPOFOL N/A 11/27/2015   TA rpt 5 yrs Lucilla Lame, MD)  . GASTRIC BYPASS  2004   roux en y [facility])  . POLYPECTOMY  11/27/2015   Procedure: POLYPECTOMY;  Surgeon: Lucilla Lame, MD;  Location: [place];  Service: Endoscopy;;  . REDUCTION MAMMAPLASTY Bilateral 1990's  . SALIVARY GLAND SURGERY    . SALIVARY STONE REMOVAL      FAMILY HISTORY: Family History  Problem Relation Age of Onset  . Cancer Father 43       leukemia  . Hypertension Mother   . Diabetes Neg Hx   . Stroke Neg Hx   . Coronary artery disease Neg Hx   . Breast cancer Neg Hx     ADVANCED DIRECTIVES (Y/N):  N  HEALTH MAINTENANCE: Social History   Tobacco Use  . Smoking status: Former Smoker    Packs/day: 0.25    Years: 20.00    Pack years: 5.00    Types: Cigarettes    Quit date: 10/31/2012  Years since quitting: 7.5  . Smokeless tobacco: Never Used  Vaping Use  . Vaping Use: Never used  Substance Use Topics  . Alcohol use: Not Currently  . Drug use: No     Colonoscopy:  PAP:  Bone density:  Lipid panel:  Allergies  Allergen Reactions  . Boniva [Ibandronic Acid] Other (See Comments)    Diffuse body aches    Current Outpatient Medications  Medication Sig Dispense Refill  . ascorbic acid 1000 MG tablet Take 1 tablet (1,000 mg total) by mouth daily.    . calcium carbonate (OS-CAL) 600 MG TABS tablet Take 600 mg by mouth daily with breakfast.    . Cholecalciferol (VITAMIN  D3) 1000 units CAPS Take 3 capsules (3,000 Units total) by mouth daily.    Marland Kitchen denosumab (PROLIA) 60 MG/ML SOSY injection Inject 60 mg into the skin every 6 (six) months. 1 mL 1  . ferrous sulfate 325 (65 FE) MG tablet Take 1 tablet (325 mg total) by mouth every Monday, Wednesday, and Friday.    . Magnesium 250 MG TABS Take 1 tablet (250 mg total) by mouth daily.  0  . methocarbamol (ROBAXIN) 500 MG tablet Take 1 tablet (500 mg total) by mouth 3 (three) times daily as needed for muscle spasms (sedation precautions). 40 tablet 1  . vitamin B-12 (CYANOCOBALAMIN) 1000 MCG tablet Take 1 tablet (1,000 mcg total) by mouth daily.    Marland Kitchen zinc gluconate 50 MG tablet Take 1 tablet (50 mg total) by mouth daily.    . prochlorperazine (COMPAZINE) 10 MG tablet Take 1 tablet (10 mg total) by mouth every 6 (six) hours as needed for nausea or vomiting. (Patient not taking: Reported on 05/07/2020) 60 tablet 0   No current facility-administered medications for this visit.   Facility-Administered Medications Ordered in Other Visits  Medication Dose Route Frequency Provider Last Rate Last Admin  . riTUXimab-pvvr (RUXIENCE) 700 mg in sodium chloride 0.9 % 250 mL (2.1875 mg/mL) infusion  375 mg/m2 (Treatment Plan Recorded) Intravenous Once [name], MD        OBJECTIVE: Vitals:   05/07/20 0914  BP: 109/80  Pulse: 65  Resp: 16  Temp: (!) 96.4 F (35.8 C)  SpO2: (!) 10%     Body mass index is 27.37 kg/m.    ECOG FS:0 - Asymptomatic  General: Well-developed, well-nourished, no acute distress. Eyes: Pink conjunctiva, anicteric sclera. HEENT: Normocephalic, moist mucous membranes. Lungs: No audible wheezing or coughing. Heart: Regular rate and rhythm. Abdomen: Soft, nontender, no obvious distention. Musculoskeletal: No edema, cyanosis, or clubbing. Neuro: Alert, answering all questions appropriately. Cranial nerves grossly intact. Skin: No rashes or petechiae noted. Psych: Normal affect. Lymphatics:  No palpable lymphadenopathy.   LAB RESULTS:  Lab Results  Component Value Date   NA 140 04/15/2020   K 4.8 04/15/2020   CL 103 04/15/2020   CO2 27 04/15/2020   GLUCOSE 87 04/15/2020   BUN 14 04/15/2020   CREATININE 0.56 04/15/2020   CALCIUM 9.4 04/15/2020   PROT 7.0 04/15/2020   ALBUMIN 4.3 04/15/2020   AST 33 04/15/2020   ALT 47 (H) 04/15/2020   ALKPHOS 55 04/15/2020   BILITOT 1.2 04/15/2020   GFRNONAA >60 04/15/2020   GFRAA >60 07/10/2013    Lab Results  Component Value Date   WBC 6.2 04/15/2020   NEUTROABS 3.8 04/15/2020   HGB 15.6 (H) 04/15/2020   HCT 46.9 (H) 04/15/2020   MCV 93.2 04/15/2020   PLT 174 04/15/2020  STUDIES: MR Lumbar Spine Wo Contrast  Addendum Date: 04/10/2020   ADDENDUM REPORT: 04/10/2020 10:11 ADDENDUM: These results were discussed by telephone on 04/10/2020 at 10:11 am with provider [name] , who verbally acknowledged these results. Electronically Signed   By: Davina Poke D.O.   On: 04/10/2020 10:11   Result Date: 04/10/2020 CLINICAL DATA:  Low back pain with left leg numbness for 2 years EXAM: MRI LUMBAR SPINE WITHOUT CONTRAST TECHNIQUE: Multiplanar, multisequence MR imaging of the lumbar spine was performed. No intravenous contrast was administered. COMPARISON:  MRI 07/29/2016 FINDINGS: Segmentation:  Standard. Alignment:  Physiologic. Vertebrae: No fracture, evidence of discitis, or suspicious bone lesion. T1/T2 hyperintense lesion within the right side of the L3 vertebral body with stippled appearance on axial view most compatible with intraosseous hemangioma. No bony expansion or extraosseous soft tissue component. Additional smaller probable intraosseous hemangiomas within the L1 and T11 vertebral bodies. Conus medullaris and cauda equina: Conus extends to the L1-2 level. Conus and cauda equina appear normal. Paraspinal and other soft tissues: Retroperitoneal aortocaval soft tissue mass anteriorly displacing the IVC measuring  approximately 7.0 x 2.8 x 4.7 cm (series 8, image 9; series 5, image 5). This is new from the prior MRI. Additional adjacent mildly enlarged para-aortic lymph nodes. Disc levels: T12-L1: Negative. L1-L2: Mild broad-based disc bulge. No foraminal or canal stenosis. Unchanged. L2-L3: Mild broad-based disc bulge. No foraminal or canal stenosis. Unchanged. L3-L4: Mild broad-based disc bulge. No canal stenosis. Borderline-mild bilateral foraminal stenosis. Unchanged. L4-L5: Mild broad-based disc bulge. Mild bilateral facet hypertrophy. Mild bilateral subarticular recess stenosis, left worse than right, without canal stenosis. Mild-to-moderate left and borderline-mild right foraminal stenosis. Unchanged. L5-S1: Shallow central disc protrusion. Mild bilateral facet hypertrophy. No foraminal or canal stenosis. Unchanged. IMPRESSION: 1. Mild multilevel lumbar spondylosis of the lumbar spine without interval progression compared to previous MRI 07/29/2016. 2. Mild-to-moderate left and borderline-mild right foraminal stenosis at L4-L5. 3. Retroperitoneal soft tissue mass anteriorly displacing the IVC measuring up to 7 cm with adjacent mildly enlarged para-aortic lymph nodes. Findings are highly suspicious for lymphoma or metastatic disease. Contrast-enhanced CT of the chest, abdomen, and pelvis is recommended for further evaluation. Ordering provider has been paged. Documentation of the communication of the above findings will be added to the report in the form of an addendum. Electronically Signed: By: Davina Poke D.O. On: 04/10/2020 10:07   NM PET Image Initial (PI) Skull Base To Thigh  Result Date: 04/14/2020 CLINICAL DATA:  Initial treatment strategy for retroperitoneal mass. EXAM: NUCLEAR MEDICINE PET SKULL BASE TO THIGH TECHNIQUE: 8.2 mCi F-18 FDG was injected intravenously. Full-ring PET imaging was performed from the skull base to thigh after the radiotracer. CT data was obtained and used for attenuation  correction and anatomic localization. Fasting blood glucose: 90 mg/dl COMPARISON:  Lumbar spine MRI 04/09/2020 FINDINGS: Mediastinal blood pool activity: SUV max 2.25 Liver activity: SUV max 3.05 NECK: No neck mass or lymphadenopathy. There are bilateral thyroid nodules. The 14 mm nodule in the left lobe inferiorly is not hypermetabolic but there is a 18 mm nodule more inferiorly which is hypermetabolic with SUV max of 4.19. This is suspicious for thyroid gland neoplasm. Recommend thyroid US and biopsy (ref: J Am Coll Radiol. 2015 Feb;12(2): 143-50). Incidental CT findings: none CHEST: No hypermetabolic breast lesions are identified. There are small scattered bilateral axillary lymph nodes. The 10 mm node the lower left axilla on image number 62 is hypermetabolic with SUV max of 6.22. No enlarged or hypermetabolic  mediastinal or hilar lymph nodes. No worrisome pulmonary lesions are identified. Incidental CT findings: None ABDOMEN/PELVIS: The right-sided retroperitoneal mass measures approximately 4.8 x 3.0 cm and is hypermetabolic with SUV max of 4.58. 10 mm left para-aortic lymph node on image 099/8 is hypermetabolic with SUV max of 3.38. There are also enlarged and hypermetabolic mesenteric lymph nodes. 16 mm node on image number 250/5 is hypermetabolic with SUV max of 3.97. No pelvic or inguinal lymphadenopathy. Incidental CT findings: Surgical changes from gastric bypass surgery are noted. Gallbladder is surgically absent. SKELETON: No findings suspicious for osseous metastatic disease. Incidental CT findings: none IMPRESSION: 1. Hypermetabolic right-sided retroperitoneal nodal mass along with hypermetabolic mesenteric adenopathy. Lymphoproliferative disease such as lymphoma is certainly a strong possibility. With history of breast cancer that would also be a consideration. Tissue diagnosis of the retroperitoneal mass may be the most useful. Recommend interventional radiology consultation. 2. There are small  bilateral axillary lymph nodes and biopsy of the most hypermetabolic lower left axillary node may also be a considered and would be safer/easier. 3. Hypermetabolic lower left thyroid nodule is certainly worrisome for thyroid cancer but I think it is unlikely this is metastatic thyroid cancer in the abdomen. Thyroid biopsy is indicated however. 4. No hypermetabolic breast masses and no evidence of pulmonary lesions or pulmonary metastatic disease. Electronically Signed   By: Marijo Sanes M.D.   On: 04/14/2020 09:27   CT Biopsy  Result Date: 04/21/2020 CLINICAL DATA:  Right retroperitoneal mass. EXAM: CT GUIDED CORE BIOPSY OF RETROPERITONEAL MASS ANESTHESIA/SEDATION: Intravenous Fentanyl 119mg and Versed 282mwere administered as conscious sedation during continuous monitoring of the patient's level of consciousness and physiological / cardiorespiratory status by the radiology RN, with a total moderate sedation time of 13 minutes. PROCEDURE: The procedure risks, benefits, and alternatives were explained to the patient. Questions regarding the procedure were encouraged and answered. The patient understands and consents to the procedure. Patient placed prone. Select axial scans through the lower abdomen obtained. The right retroperitoneal mass was localized and an appropriate skin entry site was determined and marked. The operative field was prepped with chlorhexidinein a sterile fashion, and a sterile drape was applied covering the operative field. A sterile gown and sterile gloves were used for the procedure. Local anesthesia was provided with 1% Lidocaine. Under CT fluoroscopic guidance, a 17 gauge trocar needle was advanced to the margin of the lesion. Once needle tip position was confirmed, coaxial 18-gauge core biopsy samples were obtained, submitted in saline to surgical pathology. The guide needle was removed. Postprocedure scans show minimal regional retroperitoneal hemorrhage. Patient remained  asymptomatic and stable. The patient tolerated the procedure well. COMPLICATIONS: None immediate FINDINGS: Right retroperitoneal mass was localized. Representative core biopsy samples obtained as above. IMPRESSION: 1. Technically successful CT-guided right retroperitoneal mass core biopsy Electronically Signed   By: D Lucrezia Europe.D.   On: 04/21/2020 10:34   [place]  Result Date: 05/01/2020 CLINICAL DATA:  Hypermetabolic thyroid nodule on PET-CT imaging. Lymphoma. EXAM: THYROID ULTRASOUND TECHNIQUE: Ultrasound examination of the thyroid gland and adjacent soft tissues was performed. COMPARISON:  PET-CT 04/14/2020 FINDINGS: Parenchymal Echotexture: Mildly heterogenous Isthmus: 0.3 cm Right lobe: 3.4 x 1.7 x 1.5 cm Left lobe: 4.7 x 2.1 x 1.9 cm _________________________________________________________ Estimated total number of nodules >/= 1 cm: 3 Number of spongiform nodules >/=  2 cm not described below (TR1): 0 Number of mixed cystic and solid nodules >/= 1.5 cm not described below (TRWesley 0 _________________________________________________________ Nodule # 2: Location:  Right; Mid Maximum size: 1.3 cm; Other 2 dimensions: 0.8 x 0.8 cm Composition: cannot determine (2) Echogenicity: hypoechoic (2) Shape: not taller-than-wide (0) Margins: ill-defined (0) Echogenic foci: macrocalcifications (1) ACR TI-RADS total points: 5. ACR TI-RADS risk category: TR4 (4-6 points). ACR TI-RADS recommendations: *Given size (>/= 1 - 1.4 cm) and appearance, a follow-up ultrasound in 1 year should be considered based on TI-RADS criteria. _________________________________________________________ Nodule # 4: Location: Left; Mid Maximum size: 1.9 cm; Other 2 dimensions: 1.5 x 1.6 cm Composition: solid/almost completely solid (2) Echogenicity: hypoechoic (2) Shape: not taller-than-wide (0) Margins: smooth (0) Echogenic foci: none (0) ACR TI-RADS total points: 4. ACR TI-RADS risk category: TR4 (4-6 points). ACR TI-RADS recommendations:  **Given size (>/= 1.5 cm) and appearance, fine needle aspiration of this moderately suspicious nodule should be considered based on TI-RADS criteria. _________________________________________________________ Nodule # 5: Corresponds with the hypermetabolic nodule on the PET-CT. Location: Left; Inferior Maximum size: 1.8 cm; Other 2 dimensions: 1.5 x 1.6 cm Composition: solid/almost completely solid (2) Echogenicity: hypoechoic (2) Shape: taller-than-wide (3) Margins: smooth (0) Echogenic foci: none (0) ACR TI-RADS total points: 7. ACR TI-RADS risk category: TR5 (>/= 7 points). ACR TI-RADS recommendations: **Given size (>/= 1.0 cm) and appearance, fine needle aspiration of this highly suspicious nodule should be considered based on TI-RADS criteria. _________________________________________________________ IMPRESSION: 1. Bilateral thyroid nodules. 2. Nodule #5 in the left inferior thyroid lobe corresponds with the hypermetabolic nodule on PET-CT. This is a TR 5 highly suspicious nodule. Recommend biopsy of nodule #5 in the left inferior thyroid lobe. 3. Nodule #4 in the left mid thyroid lobe is a TR 4 nodule that meets criteria for biopsy. 4. Nodule #2 in the right mid thyroid lobe meets criteria for 1 year follow-up. The above is in keeping with the ACR TI-RADS recommendations - J Am Coll Radiol 2017;14:587-595. Electronically Signed   By: Markus Daft M.D.   On: 05/01/2020 16:03    ASSESSMENT: At least stage III follicular lymphoma, grade 1/2.    PLAN:    1.  At least stage III follicular lymphoma, grade 1/2: Retroperitoneal lymph node biopsy on April 21, 2020 confirmed the diagnosis. PET scan on April 14, 2020 revealed a hypermetabolic right sided retroperitoneal nodal mass along with hypermetabolic mesenteric adenopathy.  Patient also noted to have bilateral axillary adenopathy.  Although patient is asymptomatic and active surveillance is an option, she wishes to pursue treatment and will receive IV Rituxan  weekly x4.  We discussed the possibility of doing a bone marrow biopsy, but patient does not wish to pursue and likely would not change the overall treatment plan or prognosis.  Proceed with cycle 1 of 4 of weekly Rituxan today.  Return to clinic in 1 week for further evaluation and consideration of cycle 2.  Will reimage with PET scan approximately 3 months after the conclusion of her treatments. 2.  Hypermetabolic thyroid nodule: Ultrasound remains suspicious, patient has biopsy scheduled for May 13, 2020. 3.  Elevated ALT: Mild, monitor. 4.  Polycythemia: Patient's most recent hemoglobin was 15.6, monitor.    Patient expressed understanding and was in agreement with this plan. She also understands that She can call clinic at any time with any questions, concerns, or complaints.   Cancer Staging Follicular lymphoma ([place]) Staging form: Hodgkin and Non-Hodgkin Lymphoma, AJCC 8th Edition - Clinical stage from 9/92/4268: Stage III (Follicular lymphoma) - Signed by [name], MD on 04/29/2020 Stage prefix: Initial diagnosis   [name], MD  05/07/2020 10:44 AM

## 2020-05-01 ENCOUNTER — Other Ambulatory Visit: Payer: Self-pay | Admitting: Family Medicine

## 2020-05-01 ENCOUNTER — Other Ambulatory Visit: Payer: Self-pay

## 2020-05-01 ENCOUNTER — Ambulatory Visit
Admission: RE | Admit: 2020-05-01 | Discharge: 2020-05-01 | Disposition: A | Discharge status: Home / Self Care | Payer: BC Managed Care – PPO | Source: Ambulatory Visit | Attending: Family Medicine | Admitting: Family Medicine

## 2020-05-01 DIAGNOSIS — R9389 Abnormal findings on diagnostic imaging of other specified body structures: Secondary | ICD-10-CM | POA: Diagnosis not present

## 2020-05-01 DIAGNOSIS — Z8572 Personal history of non-Hodgkin lymphomas: Secondary | ICD-10-CM | POA: Diagnosis not present

## 2020-05-01 DIAGNOSIS — E041 Nontoxic single thyroid nodule: Secondary | ICD-10-CM

## 2020-05-01 DIAGNOSIS — E042 Nontoxic multinodular goiter: Secondary | ICD-10-CM | POA: Diagnosis not present

## 2020-05-03 ENCOUNTER — Encounter: Payer: Self-pay | Admitting: Oncology

## 2020-05-04 NOTE — Telephone Encounter (Signed)
Forwarding back to Dr Gary Fleet office as the patient contacted them

## 2020-05-04 NOTE — Telephone Encounter (Signed)
Dr Grayland Ormond, the biopsy orders have been placed by Dr Danise Mina for this patient.   -Varney Daily, CMA Referral Coordinator [facility] (Dr Danise Mina)

## 2020-05-05 NOTE — Patient Instructions (Signed)
Rituximab Injection Qu es este medicamento? El RITUXIMAB es un anticuerpo monoclonal. Se utiliza para tratar ciertos tipos de cncer, tales como el linfoma no Hodgkin y la leucemia linfoctica crnica. Tambin se [place] para tratar la artritis reumatoide, la poliangitis granulomatosa, la poliangitis microscpica y el pnfigo vulgar. Este medicamento puede ser utilizado para otros usos; si tiene alguna pregunta consulte con su proveedor de atencin mdica o con su farmacutico. MARCAS COMUNES: RIABNI, Rituxan, RUXIENCE Qu le debo informar a mi profesional de la salud antes de tomar este medicamento? Necesitan saber si usted presenta alguno de los siguientes problemas o situaciones: dolor en el pecho enfermedad cardiaca infeccin, especialmente una infeccin viral, tales como varicela, fuegos labiales, hepatitis B o herpes problemas del sistema inmunolgico ritmo o frecuencia cardiaca irregular enfermedad renal recuentos sanguneos bajos (glbulos blancos, plaquetas o glbulos rojos) enfermedad pulmonar vacuna reciente o programada una reaccin alrgica o inusual al rituximab, a otros medicamentos, alimentos, colorantes o conservantes si est embarazada o buscando quedar embarazada si est amamantando a un beb Cmo debo utilizar este medicamento? Este medicamento se inyecta en una vena. Lo administra un proveedor de Geophysical data processor en un hospital o en un entorno clnico. Se le entregar una [place] del medicamento (MedGuide, su nombre en ingls) especial antes de cada tratamiento. Asegrese de leer esta informacin cada vez cuidadosamente. Hable con su proveedor de atencin mdica sobre el uso de este medicamento en nios. Aunque este medicamento se puede recetar a nios tan pequeos como de 2 aos de edad con ciertas afecciones, existen precauciones que deben tomarse. Sobredosis: Pngase en contacto inmediatamente con un centro toxicolgico o una sala de urgencia si usted cree que haya tomado demasiado  medicamento. ATENCIN: [employer] es solo para usted. No comparta este medicamento con nadie. Qu sucede si me olvido de una dosis? Cumpla con las citas para dosis de seguimiento. Es importante no olvidar ninguna dosis. Llame a su proveedor de atencin mdica si no puede asistir a una cita. Qu puede interactuar con este medicamento? No use esta medicina con ninguno de los siguientes medicamentos: vacunas vivas Esta medicina tambin puede interactuar con los siguientes medicamentos: cisplatino Puede ser que esta lista no menciona todas las posibles interacciones. Informe a su profesional de [employer]	[place] de [employer] productos a base de hierbas, medicamentos de [place] o suplementos nutritivos que est tomando. Si usted fuma, consume bebidas alcohlicas o si utiliza drogas ilegales, indqueselo tambin a su profesional de [employer]	[place]. Algunas sustancias pueden interactuar con su medicamento. A qu debo estar atento al usar [employer]? Se supervisar su estado de salud atentamente mientras reciba este medicamento. Usted podra necesitar realizarse [employer] de sangre mientras est usando [employer]. Este medicamento puede causar reacciones graves a la infusin. Para reducir Catering manager, su proveedor de atencin mdica podra darle otros medicamentos que deber usar antes de Health and safety inspector. Asegrese de seguir las instrucciones de su proveedor de Geophysical data processor. Este medicamento puede aumentar su riesgo de contraer una infeccin. Consulte con su proveedor de atencin mdica si tiene fiebre, escalofros, dolor de garganta, o cualquier otro sntoma de resfro o gripe. No se trate usted mismo. Trate de no acercarse a personas que estn enfermas. Llame a su proveedor de atencin mdica si est expuesto a cualquier persona con sarampin o varicela, o si desarrolla llagas o ampollas que no sanan correctamente. Evite usar [employer] contienen aspirina, acetaminofeno, ibuprofeno, naproxeno o  ketoprofeno, a menos que as lo indique su proveedor de Geophysical data processor. Estos productos  pueden ocultar la fiebre. Este medicamento puede causar reacciones graves en la piel. Pueden suceder semanas a meses despus de comenzar a Environmental consultant. Contacte a su proveedor de atencin mdica de inmediato si nota que tiene fiebre o sntomas gripales con una erupcin. La erupcin puede ser roja o Phill Myron, y luego puede convertirse en ampollas o descamacin de la piel. O bien, es posible que observe una erupcin roja con hinchazn en la cara, los labios o los ganglios linfticos en el cuello o debajo de los brazos. En algunos pacientes, este medicamento puede causar una infeccin grave en el cerebro que puede llevar a la muerte. Si tiene cualquier problema para ver, pensar, hablar, caminar o pararse, infrmeselo a su profesional de [employer]	[place] de inmediato. Si no puede ponerse en contacto con su profesional de la salud, busque otra fuente de atencin mdica de [place] urgente. No debe quedar [employer] est [employer], o al menos por 12 meses despus de dejar de usarlo. Las mujeres deben informar a su proveedor de atencin mdica si estn buscando quedar embarazadas o si creen que podran estar embarazadas. Existe la posibilidad de daos graves en un beb sin nacer. Para obtener ms informacin, hable con su proveedor de atencin mdica. Las [employer] deben usar un mtodo anticonceptivo confiable mientras estn recibiendo [employer] y durante 12 meses despus de dejar de usarlo. No debe amamantar a un beb mientras est tomando este medicamento, o al menos por 6 meses despus de dejar de usarlo. Qu efectos secundarios puedo tener al [employer] este medicamento? Efectos secundarios que debe informar a su proveedor de atencin mdica tan pronto como sea posible: Chief of Staff (erupcin cutnea, comezn/picazn o urticaria; hinchazn de la cara, los labios o la lengua) diarrea edema  (aumento repentino de peso; hinchazn de los tobillos, los pies, las manos u otra hinchazn inusual; dificultad para Ambulance person) frecuencia cardiaca rpida e irregular ataque cardiaco (problemas para respirar; dolor u opresin en el pecho, cuello, espalda o brazos; debilidad o cansancio inusuales) infeccin (fiebre, escalofros, tos, dolor de garganta, Social research officer, government o dificultad para Garment/textile technologist) lesin renal (dificultad para orinar o cambios en la cantidad de orina) lesin en el hgado (orina amarilla oscura o [place]; sensacin general de estar enfermo o sntomas gripales; prdida del apetito; dolor en la regin abdominal superior derecha; debilidad o cansancio inusuales; color amarillento de los ojos o la piel) presin arterial baja (mareos; sensacin de desmayo o aturdimiento; cadas; debilidad o cansancio inusuales) recuentos bajos de glbulos rojos (dificultad para respirar; sensacin de desmayo; aturdimiento; cadas; debilidad o cansancio inusuales) llagas en la boca enrojecimiento, formacin de ampollas, descamacin o distensin de la piel, incluso dentro de la boca dolor estomacal sangrado o moretones inusuales sibilancias (dificultad para respirar acompaada de sonidos fuertes o silbidos) vmito Efectos secundarios que generalmente no requieren atencin mdica (debe informarlos a su proveedor de atencin mdica si persisten o si son molestos): dolor de Special educational needs teacher en las articulaciones calambres, dolores musculares nuseas Puede ser que esta lista no menciona todos los posibles efectos secundarios. Comunquese a su mdico por asesoramiento mdico [employer] Usted puede informar los efectos secundarios a la FDA por telfono al 1-800-FDA-1088. Dnde debo guardar mi medicina? Este medicamento se administra en hospitales o clnicas. no se guarda en su casa. ATENCIN: Este folleto es un resumen. Puede ser que no cubra toda la posible informacin. Si usted tiene preguntas acerca de esta medicina,  consulte con su mdico, su farmacutico o su profesional de Technical sales engineer.  2021 Elsevier/Gold Standard (2020-01-01 00:00:00)

## 2020-05-06 ENCOUNTER — Inpatient Hospital Stay: Payer: BC Managed Care – PPO | Attending: Oncology

## 2020-05-06 ENCOUNTER — Inpatient Hospital Stay: Payer: BC Managed Care – PPO | Admitting: Oncology

## 2020-05-06 DIAGNOSIS — C8200 Follicular lymphoma grade I, unspecified site: Secondary | ICD-10-CM | POA: Insufficient documentation

## 2020-05-06 DIAGNOSIS — Z79899 Other long term (current) drug therapy: Secondary | ICD-10-CM | POA: Insufficient documentation

## 2020-05-06 DIAGNOSIS — Z5112 Encounter for antineoplastic immunotherapy: Secondary | ICD-10-CM | POA: Insufficient documentation

## 2020-05-06 NOTE — Progress Notes (Signed)
This encounter was created in error - please disregard.

## 2020-05-07 ENCOUNTER — Other Ambulatory Visit: Payer: Self-pay

## 2020-05-07 ENCOUNTER — Inpatient Hospital Stay: Payer: BC Managed Care – PPO

## 2020-05-07 ENCOUNTER — Inpatient Hospital Stay (HOSPITAL_BASED_OUTPATIENT_CLINIC_OR_DEPARTMENT_OTHER): Payer: BC Managed Care – PPO | Admitting: Oncology

## 2020-05-07 ENCOUNTER — Encounter: Payer: Self-pay | Admitting: Oncology

## 2020-05-07 VITALS — BP 108/67 | HR 76 | Temp 97.3°F | Resp 20

## 2020-05-07 VITALS — BP 109/80 | HR 65 | Temp 96.4°F | Resp 16 | Wt 164.5 lb

## 2020-05-07 DIAGNOSIS — C8200 Follicular lymphoma grade I, unspecified site: Secondary | ICD-10-CM | POA: Diagnosis not present

## 2020-05-07 DIAGNOSIS — C8218 Follicular lymphoma grade II, lymph nodes of multiple sites: Secondary | ICD-10-CM

## 2020-05-07 DIAGNOSIS — Z5112 Encounter for antineoplastic immunotherapy: Secondary | ICD-10-CM | POA: Diagnosis not present

## 2020-05-07 DIAGNOSIS — Z79899 Other long term (current) drug therapy: Secondary | ICD-10-CM | POA: Diagnosis not present

## 2020-05-07 LAB — HEPATITIS B SURFACE ANTIGEN: Hepatitis B Surface Ag: NONREACTIVE

## 2020-05-07 LAB — HEPATITIS B CORE ANTIBODY, TOTAL: Hep B Core Total Ab: NONREACTIVE

## 2020-05-07 MED ORDER — ACETAMINOPHEN 325 MG PO TABS
650.0000 mg | ORAL_TABLET | Freq: Once | ORAL | Status: AC
  Administered 2020-05-07: 650 mg via ORAL
  Filled 2020-05-07: qty 2

## 2020-05-07 MED ORDER — DIPHENHYDRAMINE HCL 25 MG PO CAPS
25.0000 mg | ORAL_CAPSULE | Freq: Once | ORAL | Status: AC
  Administered 2020-05-07: 25 mg via ORAL
  Filled 2020-05-07: qty 1

## 2020-05-07 MED ORDER — SODIUM CHLORIDE 0.9 % IV SOLN
Freq: Once | INTRAVENOUS | Status: AC
  Filled 2020-05-07: qty 250

## 2020-05-07 MED ORDER — SODIUM CHLORIDE 0.9 % IV SOLN
375.0000 mg/m2 | Freq: Once | INTRAVENOUS | Status: AC
  Administered 2020-05-07: 700 mg via INTRAVENOUS
  Filled 2020-05-07: qty 50

## 2020-05-07 NOTE — Progress Notes (Signed)
Per MD no CBC/CMP needed today, cont with tx

## 2020-05-07 NOTE — Progress Notes (Signed)
Patient here today for follow up, treatment consideration regarding lymphoma.  Patient here for first Rituxan treatment, denies any concerns.

## 2020-05-08 ENCOUNTER — Telehealth: Payer: Self-pay

## 2020-05-08 NOTE — Telephone Encounter (Signed)
Telephone call to patient for follow up after receiving first infusion.   No answer but left message stating we were calling to check on them.  Encouraged patient to call for any questions or concerns.   

## 2020-05-09 NOTE — Progress Notes (Signed)
[place]  [phone] Fax:([phone]) [phone]  ID: Melinda Hall OB: 01/23/1961  MR#: [id number]  EGB#:[id number]  Patient Care Team: [name], MD as PCP - General (Family Medicine)  CHIEF COMPLAINT: At least stage III follicular lymphoma, grade 1/2.    INTERVAL HISTORY: Patient returns to clinic today for further evaluation and consideration of cycle 2 of 4 of weekly Rituxan.  She tolerated her first treatment well without significant side effects.  Although she does admit to increased fatigue this past week.  She has no neurologic complaints.  She denies any recent fevers or illnesses.  She denies any night sweats.  She has no chest pain, shortness of breath, cough, or hemoptysis.  She denies any nausea, vomiting, constipation, or diarrhea.  She has no urinary complaints.  Patient offers no further specific complaints today.  REVIEW OF SYSTEMS:   Review of Systems  Constitutional: Positive for malaise/fatigue. Negative for fever and weight loss.  Respiratory: Negative.  Negative for cough, hemoptysis and shortness of breath.   Cardiovascular: Negative.  Negative for chest pain and leg swelling.  Gastrointestinal: Negative.  Negative for abdominal pain.  Genitourinary: Negative.  Negative for dysuria.  Musculoskeletal: Negative.  Negative for back pain.  Skin: Negative.  Negative for rash.  Neurological: Negative.  Negative for tingling, sensory change, focal weakness, weakness and headaches.  Psychiatric/Behavioral: Negative.  The patient is not nervous/anxious.     As per HPI. Otherwise, a complete review of systems is negative.  PAST MEDICAL HISTORY: Past Medical History:  Diagnosis Date  . Dermoid tumor    hx of--left ovary  . Gastric bypass status for obesity 2004   s/p roux-en-y  . Generalized headaches    frequent  . Heart murmur   . History of cardiac murmur   . Intraductal papilloma of breast, left 06/18/2017   05/2017 - s/p  excision 07/2017  . Osteoporosis 03/2016   DEXA -2.6 spine  . Salivary gland stone    x 2  . Smoker     PAST SURGICAL HISTORY: Past Surgical History:  Procedure Laterality Date  . ABDOMINAL HYSTERECTOMY     only ovary was removed  . ANTERIOR CERVICAL DECOMP/DISCECTOMY FUSION  12/2016   Pool  . APPENDECTOMY  1980  . BILATERAL OOPHORECTOMY  ~1992   for dermoid ovarian cysts  . BREAST BIOPSY Left 06/13/2017   papilloma with excision 07/2017  . BREAST LUMPECTOMY WITH NEEDLE LOCALIZATION Left 08/10/2017   intraductal papilloma - Jules Husbands, MD  . CHOLECYSTECTOMY  2000s  . COLONOSCOPY WITH PROPOFOL N/A 11/27/2015   TA rpt 5 yrs Lucilla Lame, MD)  . GASTRIC BYPASS  2004   roux en y [facility])  . POLYPECTOMY  11/27/2015   Procedure: POLYPECTOMY;  Surgeon: Lucilla Lame, MD;  Location: [place];  Service: Endoscopy;;  . REDUCTION MAMMAPLASTY Bilateral 1990's  . SALIVARY GLAND SURGERY    . SALIVARY STONE REMOVAL      FAMILY HISTORY: Family History  Problem Relation Age of Onset  . Cancer Father 50       leukemia  . Hypertension Mother   . Diabetes Neg Hx   . Stroke Neg Hx   . Coronary artery disease Neg Hx   . Breast cancer Neg Hx     ADVANCED DIRECTIVES (Y/N):  N  HEALTH MAINTENANCE: Social History   Tobacco Use  . Smoking status: Former Smoker    Packs/day: 0.25    Years: 20.00    Pack years:  5.00    Types: Cigarettes    Quit date: 10/31/2012    Years since quitting: 7.5  . Smokeless tobacco: Never Used  Vaping Use  . Vaping Use: Never used  Substance Use Topics  . Alcohol use: Not Currently  . Drug use: No     Colonoscopy:  PAP:  Bone density:  Lipid panel:  Allergies  Allergen Reactions  . Boniva [Ibandronic Acid] Other (See Comments)    Diffuse body aches    Current Outpatient Medications  Medication Sig Dispense Refill  . ascorbic acid 1000 MG tablet Take 1 tablet (1,000 mg total) by mouth daily.    . calcium carbonate (OS-CAL) 600 MG  TABS tablet Take 600 mg by mouth daily with breakfast.    . Cholecalciferol (VITAMIN D3) 1000 units CAPS Take 3 capsules (3,000 Units total) by mouth daily.    Marland Kitchen denosumab (PROLIA) 60 MG/ML SOSY injection Inject 60 mg into the skin every 6 (six) months. 1 mL 1  . ferrous sulfate 325 (65 FE) MG tablet Take 1 tablet (325 mg total) by mouth every Monday, Wednesday, and Friday.    . Magnesium 250 MG TABS Take 1 tablet (250 mg total) by mouth daily.  0  . methocarbamol (ROBAXIN) 500 MG tablet Take 1 tablet (500 mg total) by mouth 3 (three) times daily as needed for muscle spasms (sedation precautions). 40 tablet 1  . vitamin B-12 (CYANOCOBALAMIN) 1000 MCG tablet Take 1 tablet (1,000 mcg total) by mouth daily.    Marland Kitchen zinc gluconate 50 MG tablet Take 1 tablet (50 mg total) by mouth daily.    . prochlorperazine (COMPAZINE) 10 MG tablet Take 1 tablet (10 mg total) by mouth every 6 (six) hours as needed for nausea or vomiting. (Patient not taking: No sig reported) 60 tablet 0   No current facility-administered medications for this visit.    OBJECTIVE: Vitals:   05/14/20 0911  BP: 125/90  Pulse: 62  Resp: 20  Temp: (!) 97.4 F (36.3 C)     Body mass index is 27.11 kg/m.    ECOG FS:0 - Asymptomatic  General: Well-developed, well-nourished, no acute distress. Eyes: Pink conjunctiva, anicteric sclera. HEENT: Normocephalic, moist mucous membranes. Lungs: No audible wheezing or coughing. Heart: Regular rate and rhythm. Abdomen: Soft, nontender, no obvious distention. Musculoskeletal: No edema, cyanosis, or clubbing. Neuro: Alert, answering all questions appropriately. Cranial nerves grossly intact. Skin: No rashes or petechiae noted. Psych: Normal affect.  LAB RESULTS:  Lab Results  Component Value Date   NA 140 05/14/2020   K 4.5 05/14/2020   CL 104 05/14/2020   CO2 26 05/14/2020   GLUCOSE 96 05/14/2020   BUN 21 (H) 05/14/2020   CREATININE 0.56 05/14/2020   CALCIUM 9.1 05/14/2020   PROT  7.1 05/14/2020   ALBUMIN 4.3 05/14/2020   AST 39 05/14/2020   ALT 55 (H) 05/14/2020   ALKPHOS 53 05/14/2020   BILITOT 1.0 05/14/2020   GFRNONAA >60 05/14/2020   GFRAA >60 07/10/2013    Lab Results  Component Value Date   WBC 7.1 05/14/2020   NEUTROABS 4.7 05/14/2020   HGB 15.4 (H) 05/14/2020   HCT 46.3 (H) 05/14/2020   MCV 92.6 05/14/2020   PLT 189 05/14/2020     STUDIES: [place] FNA BX THYROID 1ST LESION AFIRMA  Result Date: 05/13/2020 INDICATION: Indeterminate thyroid nodules. Recent diagnosis of lymphoma, found to have a hypermetabolic left-sided thyroid nodule on PET-CT performed 04/14/2020. Subsequent thyroid ultrasound performed 05/01/2020 demonstrates two thyroid nodules  which meet imaging criteria to recommend percutaneous sampling. EXAM: ULTRASOUND GUIDED FINE NEEDLE ASPIRATION OF INDETERMINATE THYROID NODULE COMPARISON:  Thyroid ultrasound-05/01/2020; PET-CT-04/14/2020 MEDICATIONS: None COMPLICATIONS: None immediate. TECHNIQUE: Informed written consent was obtained from the patient after a discussion of the risks, benefits and alternatives to treatment. Questions regarding the procedure were encouraged and answered. A timeout was performed prior to the initiation of the procedure. Pre-procedural ultrasound scanning demonstrated unchanged size and appearance of the indeterminate nodules within the left lobe of the thyroid. The procedure was planned. The neck was prepped in the usual sterile fashion, and a sterile drape was applied covering the operative field. A timeout was performed prior to the initiation of the procedure. Local anesthesia was provided with 1% lidocaine. Under direct ultrasound guidance, 7 FNA biopsies were performed of the indeterminate nodule within the inferior pole the left lobe of the thyroid with a 25 gauge needle. Multiple ultrasound images were saved for procedural documentation purposes. The samples were prepared and submitted to pathology. Final 2 samples were  set aside potential molecular testing. Under direct ultrasound guidance, 7 FNA biopsies were performed of the indeterminate nodule within the mid aspect the left lobe of the thyroid with a 25 gauge needle. Multiple ultrasound images were saved for procedural documentation purposes. The samples were prepared and submitted to pathology. Final 2 samples were set aside for potential molecular testing. Limited post procedural scanning was negative for hematoma or additional complication. Dressings were placed. The patient tolerated the above procedures procedure well without immediate postprocedural complication. FINDINGS: Nodule reference number based on prior diagnostic ultrasound: 5 Maximum size: 1.8 Location: Left; Inferior ACR TI-RADS risk category: TR5 (>/= 7 points) Reason for biopsy: meets ACR TI-RADS criteria _________________________________________________________ Nodule reference number based on prior diagnostic ultrasound: 4 Maximum size: 1.9 Location: Left; Mid ACR TI-RADS risk category: TR4 (4-6 points) Reason for biopsy: meets other recommendations Ultrasound imaging confirms appropriate placement of the needles within the thyroid nodule. IMPRESSION: 1. Technically successful ultrasound guided fine needle aspiration of indeterminate nodule within the inferior pole the left lobe of the thyroid (labeled 5). 2. Technically successful ultrasound guided fine needle aspiration of indeterminate nodule within mid aspect the left lobe of the thyroid (labeled 4). Electronically Signed   By: Sandi Mariscal M.D.   On: 05/13/2020 16:06   CT Biopsy  Result Date: 04/21/2020 CLINICAL DATA:  Right retroperitoneal mass. EXAM: CT GUIDED CORE BIOPSY OF RETROPERITONEAL MASS ANESTHESIA/SEDATION: Intravenous Fentanyl 182mg and Versed 273mwere administered as conscious sedation during continuous monitoring of the patient's level of consciousness and physiological / cardiorespiratory status by the radiology RN, with a total  moderate sedation time of 13 minutes. PROCEDURE: The procedure risks, benefits, and alternatives were explained to the patient. Questions regarding the procedure were encouraged and answered. The patient understands and consents to the procedure. Patient placed prone. Select axial scans through the lower abdomen obtained. The right retroperitoneal mass was localized and an appropriate skin entry site was determined and marked. The operative field was prepped with chlorhexidinein a sterile fashion, and a sterile drape was applied covering the operative field. A sterile gown and sterile gloves were used for the procedure. Local anesthesia was provided with 1% Lidocaine. Under CT fluoroscopic guidance, a 17 gauge trocar needle was advanced to the margin of the lesion. Once needle tip position was confirmed, coaxial 18-gauge core biopsy samples were obtained, submitted in saline to surgical pathology. The guide needle was removed. Postprocedure scans show minimal regional retroperitoneal hemorrhage. Patient remained  asymptomatic and stable. The patient tolerated the procedure well. COMPLICATIONS: None immediate FINDINGS: Right retroperitoneal mass was localized. Representative core biopsy samples obtained as above. IMPRESSION: 1. Technically successful CT-guided right retroperitoneal mass core biopsy Electronically Signed   By: Lucrezia Europe M.D.   On: 04/21/2020 10:34   US THYROID  Result Date: 05/01/2020 CLINICAL DATA:  Hypermetabolic thyroid nodule on PET-CT imaging. Lymphoma. EXAM: THYROID ULTRASOUND TECHNIQUE: Ultrasound examination of the thyroid gland and adjacent soft tissues was performed. COMPARISON:  PET-CT 04/14/2020 FINDINGS: Parenchymal Echotexture: Mildly heterogenous Isthmus: 0.3 cm Right lobe: 3.4 x 1.7 x 1.5 cm Left lobe: 4.7 x 2.1 x 1.9 cm _________________________________________________________ Estimated total number of nodules >/= 1 cm: 3 Number of spongiform nodules >/=  2 cm not described below  (TR1): 0 Number of mixed cystic and solid nodules >/= 1.5 cm not described below ([place]): 0 _________________________________________________________ Nodule # 2: Location: Right; Mid Maximum size: 1.3 cm; Other 2 dimensions: 0.8 x 0.8 cm Composition: cannot determine (2) Echogenicity: hypoechoic (2) Shape: not taller-than-wide (0) Margins: ill-defined (0) Echogenic foci: macrocalcifications (1) ACR TI-RADS total points: 5. ACR TI-RADS risk category: TR4 (4-6 points). ACR TI-RADS recommendations: *Given size (>/= 1 - 1.4 cm) and appearance, a follow-up ultrasound in 1 year should be considered based on TI-RADS criteria. _________________________________________________________ Nodule # 4: Location: Left; Mid Maximum size: 1.9 cm; Other 2 dimensions: 1.5 x 1.6 cm Composition: solid/almost completely solid (2) Echogenicity: hypoechoic (2) Shape: not taller-than-wide (0) Margins: smooth (0) Echogenic foci: none (0) ACR TI-RADS total points: 4. ACR TI-RADS risk category: TR4 (4-6 points). ACR TI-RADS recommendations: **Given size (>/= 1.5 cm) and appearance, fine needle aspiration of this moderately suspicious nodule should be considered based on TI-RADS criteria. _________________________________________________________ Nodule # 5: Corresponds with the hypermetabolic nodule on the PET-CT. Location: Left; Inferior Maximum size: 1.8 cm; Other 2 dimensions: 1.5 x 1.6 cm Composition: solid/almost completely solid (2) Echogenicity: hypoechoic (2) Shape: taller-than-wide (3) Margins: smooth (0) Echogenic foci: none (0) ACR TI-RADS total points: 7. ACR TI-RADS risk category: TR5 (>/= 7 points). ACR TI-RADS recommendations: **Given size (>/= 1.0 cm) and appearance, fine needle aspiration of this highly suspicious nodule should be considered based on TI-RADS criteria. _________________________________________________________ IMPRESSION: 1. Bilateral thyroid nodules. 2. Nodule #5 in the left inferior thyroid lobe corresponds with  the hypermetabolic nodule on PET-CT. This is a TR 5 highly suspicious nodule. Recommend biopsy of nodule #5 in the left inferior thyroid lobe. 3. Nodule #4 in the left mid thyroid lobe is a TR 4 nodule that meets criteria for biopsy. 4. Nodule #2 in the right mid thyroid lobe meets criteria for 1 year follow-up. The above is in keeping with the ACR TI-RADS recommendations - J Am Coll Radiol 2017;14:587-595. Electronically Signed   By: Markus Daft M.D.   On: 05/01/2020 16:03   [place] FNA BIOPSY THYROID EA ADD LESION AFIRMA  Result Date: 05/13/2020 INDICATION: Indeterminate thyroid nodules. Recent diagnosis of lymphoma, found to have a hypermetabolic left-sided thyroid nodule on PET-CT performed 04/14/2020. Subsequent thyroid ultrasound performed 05/01/2020 demonstrates two thyroid nodules which meet imaging criteria to recommend percutaneous sampling. EXAM: ULTRASOUND GUIDED FINE NEEDLE ASPIRATION OF INDETERMINATE THYROID NODULE COMPARISON:  Thyroid ultrasound-05/01/2020; PET-CT-04/14/2020 MEDICATIONS: None COMPLICATIONS: None immediate. TECHNIQUE: Informed written consent was obtained from the patient after a discussion of the risks, benefits and alternatives to treatment. Questions regarding the procedure were encouraged and answered. A timeout was performed prior to the initiation of the procedure. Pre-procedural ultrasound scanning demonstrated unchanged  size and appearance of the indeterminate nodules within the left lobe of the thyroid. The procedure was planned. The neck was prepped in the usual sterile fashion, and a sterile drape was applied covering the operative field. A timeout was performed prior to the initiation of the procedure. Local anesthesia was provided with 1% lidocaine. Under direct ultrasound guidance, 7 FNA biopsies were performed of the indeterminate nodule within the inferior pole the left lobe of the thyroid with a 25 gauge needle. Multiple ultrasound images were saved for procedural  documentation purposes. The samples were prepared and submitted to pathology. Final 2 samples were set aside potential molecular testing. Under direct ultrasound guidance, 7 FNA biopsies were performed of the indeterminate nodule within the mid aspect the left lobe of the thyroid with a 25 gauge needle. Multiple ultrasound images were saved for procedural documentation purposes. The samples were prepared and submitted to pathology. Final 2 samples were set aside for potential molecular testing. Limited post procedural scanning was negative for hematoma or additional complication. Dressings were placed. The patient tolerated the above procedures procedure well without immediate postprocedural complication. FINDINGS: Nodule reference number based on prior diagnostic ultrasound: 5 Maximum size: 1.8 Location: Left; Inferior ACR TI-RADS risk category: TR5 (>/= 7 points) Reason for biopsy: meets ACR TI-RADS criteria _________________________________________________________ Nodule reference number based on prior diagnostic ultrasound: 4 Maximum size: 1.9 Location: Left; Mid ACR TI-RADS risk category: TR4 (4-6 points) Reason for biopsy: meets other recommendations Ultrasound imaging confirms appropriate placement of the needles within the thyroid nodule. IMPRESSION: 1. Technically successful ultrasound guided fine needle aspiration of indeterminate nodule within the inferior pole the left lobe of the thyroid (labeled 5). 2. Technically successful ultrasound guided fine needle aspiration of indeterminate nodule within mid aspect the left lobe of the thyroid (labeled 4). Electronically Signed   By: Sandi Mariscal M.D.   On: 05/13/2020 16:06    ASSESSMENT: At least stage III follicular lymphoma, grade 1/2.    PLAN:    1.  At least stage III follicular lymphoma, grade 1/2: Retroperitoneal lymph node biopsy on April 21, 2020 confirmed the diagnosis. PET scan on April 14, 2020 revealed a hypermetabolic right sided  retroperitoneal nodal mass along with hypermetabolic mesenteric adenopathy.  Patient also noted to have bilateral axillary adenopathy.  Although patient is asymptomatic and active surveillance is an option, she wishes to pursue treatment and will receive IV Rituxan weekly x4.  We discussed the possibility of doing a bone marrow biopsy, but patient does not wish to pursue and likely would not change the overall treatment plan or prognosis.  Proceed with cycle 2 of 4 weekly Rituxan today.  Return to clinic in 1 week for further evaluation and consideration of cycle 3.  Plan to reimage with PET scan approximately 3 months after the conclusion of her treatments. 2.  Hypermetabolic thyroid nodule: Ultrasound remains suspicious.  Patient had biopsy yesterday.  Results are pending. 3.  Elevated ALT: Chronic and unchanged.  Monitor. 4.  Polycythemia: Chronic and unchanged.  Patient's most recent hemoglobin was 15.4.    Patient expressed understanding and was in agreement with this plan. She also understands that She can call clinic at any time with any questions, concerns, or complaints.   Cancer Staging Follicular lymphoma ([place]) Staging form: Hodgkin and Non-Hodgkin Lymphoma, AJCC 8th Edition - Clinical stage from 2/84/1324: Stage III (Follicular lymphoma) - Signed by [name], MD on 04/29/2020 Stage prefix: Initial diagnosis   [name], MD  05/14/2020 2:29 PM

## 2020-05-13 ENCOUNTER — Other Ambulatory Visit: Payer: Self-pay

## 2020-05-13 ENCOUNTER — Ambulatory Visit
Admission: RE | Admit: 2020-05-13 | Discharge: 2020-05-13 | Disposition: A | Discharge status: Home / Self Care | Payer: BC Managed Care – PPO | Source: Ambulatory Visit | Attending: Family Medicine | Admitting: Family Medicine

## 2020-05-13 DIAGNOSIS — E041 Nontoxic single thyroid nodule: Secondary | ICD-10-CM | POA: Diagnosis not present

## 2020-05-13 DIAGNOSIS — Z8572 Personal history of non-Hodgkin lymphomas: Secondary | ICD-10-CM | POA: Insufficient documentation

## 2020-05-13 DIAGNOSIS — E042 Nontoxic multinodular goiter: Secondary | ICD-10-CM | POA: Diagnosis not present

## 2020-05-13 NOTE — Procedures (Signed)
Pre Procedure Dx: Indeterminate thyroid nodules Post Procedural Dx: Same  Technically successful US guided biopsy of indeterminate hypermetabolic left thyroid nodule (labeled #5), TR5, 1.8 cm. Technically successful US guided biopsy of indeterminate left thyroid nodule (labeled #4), TR4, 1.9 cm.  EBL: None  No immediate complications.   Ronny Bacon, MD Pager #: [phone]

## 2020-05-13 NOTE — Discharge Instructions (Signed)
Biopsia con aguja de la tiroides, cuidados posteriores Thyroid Needle Biopsy, Care After [employer] brinda informacin sobre cmo cuidarse despus del procedimiento. El mdico tambin podr darle instrucciones ms especficas. Comunquese con el mdico si tiene problemas o preguntas. Qu puedo esperar despus del procedimiento? Despus del procedimiento, es normal tener los siguientes sntomas:  Dolor y sensibilidad que duran unos das.  Un hematoma en el lugar donde se insert la aguja (lugar de la puncin). Siga estas instrucciones en su casa:  Use los medicamentos de venta libre y los recetados solamente como se lo haya indicado el mdico.  Para ayudar a Public house manager las [place], Quarry manager la cabeza levantada (elevada) sobre una almohada cuando est acostado. Cuando cambie de posicin de acostado a sentado, use ambas manos para Magazine features editor parte posterior de la cabeza y el cuello.  Psychiatric nurse de la puncin todos los das para descartar signos de infeccin. Est atento a los siguientes signos: ? Enrojecimiento, hinchazn o dolor. ? Lquido o sangre. ? Calor. ? Pus o mal olor.  Retome sus actividades normales segn lo indicado por el mdico. Pregntele al mdico qu actividades son seguras para usted.  Concurra a todas las visitas de seguimiento como se lo haya indicado el mdico. Esto es importante.   Comunquese con un mdico si:  Tiene enrojecimiento, hinchazn o dolor alrededor del lugar de la puncin.  Observa lquido o sangre que salen del lugar de la puncin.  El lugar de la puncin se siente caliente al tacto.  Tiene pus o percibe mal olor que provienen del lugar de la puncin.  Tiene fiebre. Solicite ayuda inmediatamente si:  Tiene sangrado abundante en el lugar de la puncin.  Tiene dificultad para tragar.  Las glndulas del cuello (ganglios linfticos) estn hinchadas. Resumen  Es frecuente que tenga hematomas y Information systems manager donde se insert  la aguja en el rea anterior e inferior del cuello (lugar de la puncin).  Controle todos los [employer] lugar de la puncin para Hydrographic surveyor signos de infeccin, como enrojecimiento, hinchazn o Social research officer, government.  Solicite ayuda de inmediato si presenta sangrado intenso proveniente del lugar de la puncin. Esta informacin no tiene Marine scientist el consejo del mdico. Asegrese de hacerle al mdico cualquier pregunta que tenga. Document Revised: 11/28/2019 Document Reviewed: 11/28/2019 Elsevier Patient Education  2021 [employer].

## 2020-05-14 ENCOUNTER — Inpatient Hospital Stay (HOSPITAL_BASED_OUTPATIENT_CLINIC_OR_DEPARTMENT_OTHER): Payer: BC Managed Care – PPO | Admitting: Oncology

## 2020-05-14 ENCOUNTER — Inpatient Hospital Stay: Payer: BC Managed Care – PPO

## 2020-05-14 VITALS — BP 125/90 | HR 62 | Temp 97.4°F | Resp 20 | Wt 162.9 lb

## 2020-05-14 VITALS — BP 130/86 | HR 61 | Resp 16

## 2020-05-14 DIAGNOSIS — C8200 Follicular lymphoma grade I, unspecified site: Secondary | ICD-10-CM | POA: Diagnosis not present

## 2020-05-14 DIAGNOSIS — C8218 Follicular lymphoma grade II, lymph nodes of multiple sites: Secondary | ICD-10-CM | POA: Diagnosis not present

## 2020-05-14 DIAGNOSIS — Z79899 Other long term (current) drug therapy: Secondary | ICD-10-CM | POA: Diagnosis not present

## 2020-05-14 DIAGNOSIS — Z5112 Encounter for antineoplastic immunotherapy: Secondary | ICD-10-CM | POA: Diagnosis not present

## 2020-05-14 LAB — CBC WITH DIFFERENTIAL/PLATELET
Abs Immature Granulocytes: 0.03 10*3/uL (ref 0.00–0.07)
Basophils Absolute: 0.1 10*3/uL (ref 0.0–0.1)
Basophils Relative: 1 %
Eosinophils Absolute: 0.1 10*3/uL (ref 0.0–0.5)
Eosinophils Relative: 2 %
HCT: 46.3 % — ABNORMAL HIGH (ref 36.0–46.0)
Hemoglobin: 15.4 g/dL — ABNORMAL HIGH (ref 12.0–15.0)
Immature Granulocytes: 0 %
Lymphocytes Relative: 21 %
Lymphs Abs: 1.5 10*3/uL (ref 0.7–4.0)
MCH: 30.8 pg (ref 26.0–34.0)
MCHC: 33.3 g/dL (ref 30.0–36.0)
MCV: 92.6 fL (ref 80.0–100.0)
Monocytes Absolute: 0.7 10*3/uL (ref 0.1–1.0)
Monocytes Relative: 10 %
Neutro Abs: 4.7 10*3/uL (ref 1.7–7.7)
Neutrophils Relative %: 66 %
Platelets: 189 10*3/uL (ref 150–400)
RBC: 5 MIL/uL (ref 3.87–5.11)
RDW: 13.2 % (ref 11.5–15.5)
WBC: 7.1 10*3/uL (ref 4.0–10.5)
nRBC: 0 % (ref 0.0–0.2)

## 2020-05-14 LAB — COMPREHENSIVE METABOLIC PANEL
ALT: 55 U/L — ABNORMAL HIGH (ref 0–44)
AST: 39 U/L (ref 15–41)
Albumin: 4.3 g/dL (ref 3.5–5.0)
Alkaline Phosphatase: 53 U/L (ref 38–126)
Anion gap: 10 (ref 5–15)
BUN: 21 mg/dL — ABNORMAL HIGH (ref 6–20)
CO2: 26 mmol/L (ref 22–32)
Calcium: 9.1 mg/dL (ref 8.9–10.3)
Chloride: 104 mmol/L (ref 98–111)
Creatinine, Ser: 0.56 mg/dL (ref 0.44–1.00)
GFR, Estimated: >60 mL/min (ref >60)
Glucose, Bld: 96 mg/dL (ref 70–99)
Potassium: 4.5 mmol/L (ref 3.5–5.1)
Sodium: 140 mmol/L (ref 135–145)
Total Bilirubin: 1 mg/dL (ref 0.3–1.2)
Total Protein: 7.1 g/dL (ref 6.5–8.1)

## 2020-05-14 LAB — CYTOLOGY - NON PAP

## 2020-05-14 MED ORDER — DIPHENHYDRAMINE HCL 25 MG PO CAPS
25.0000 mg | ORAL_CAPSULE | Freq: Once | ORAL | Status: AC
Start: 2020-05-14 — End: 2020-05-14
  Administered 2020-05-14: 25 mg via ORAL
  Filled 2020-05-14: qty 1

## 2020-05-14 MED ORDER — SODIUM CHLORIDE 0.9 % IV SOLN
375.0000 mg/m2 | Freq: Once | INTRAVENOUS | Status: AC
Start: 2020-05-14 — End: 2020-05-14
  Administered 2020-05-14: 700 mg via INTRAVENOUS
  Filled 2020-05-14: qty 50

## 2020-05-14 MED ORDER — ACETAMINOPHEN 325 MG PO TABS
650.0000 mg | ORAL_TABLET | Freq: Once | ORAL | Status: AC
Start: 2020-05-14 — End: 2020-05-14
  Administered 2020-05-14: 650 mg via ORAL
  Filled 2020-05-14: qty 2

## 2020-05-14 MED ORDER — SODIUM CHLORIDE 0.9 % IV SOLN
Freq: Once | INTRAVENOUS | Status: AC
Start: 2020-05-14 — End: 2020-05-14
  Filled 2020-05-14: qty 250

## 2020-05-14 MED ORDER — SODIUM CHLORIDE 0.9 % IV SOLN: 375.0000 mg/m2 | Freq: Once | INTRAVENOUS | Status: DC

## 2020-05-14 NOTE — Progress Notes (Signed)
Patient here today for follow up, treatment regarding lymphoma. Patient reports she tolerated treatment well, had some fatigue and dizziness. Patient had thryoid biopsy yesterday, complains of minimal tenderness today.

## 2020-05-17 ENCOUNTER — Encounter: Payer: Self-pay | Admitting: Family Medicine

## 2020-05-17 DIAGNOSIS — E042 Nontoxic multinodular goiter: Secondary | ICD-10-CM | POA: Insufficient documentation

## 2020-05-17 NOTE — Progress Notes (Signed)
[place]  Telephone:([phone]) [phone] Fax:(336) [phone]  ID: Melinda Hall OB: 09-26-1960  MR#: [id number]  TDH#:[id number]  Patient Care Team: [name], MD as PCP - General (Family Medicine)  CHIEF COMPLAINT: At least stage III follicular lymphoma, grade 1/2.    INTERVAL HISTORY: Patient returns to clinic today for further evaluation and consideration of cycle 3 of 4 of weekly Rituxan.  Patient states her fatigue was not as significant this past week.  She is otherwise tolerating her treatments well without significant side effects.  Her UTI symptoms have resolved.  She currently feels well and is asymptomatic.  She has no neurologic complaints.  She denies any recent fevers or illnesses.  She denies any night sweats.  She has no chest pain, shortness of breath, cough, or hemoptysis.  She denies any nausea, vomiting, constipation, or diarrhea.  She has no urinary complaints.  Patient offers no specific complaints today.  REVIEW OF SYSTEMS:   Review of Systems  Constitutional: Negative.  Negative for fever, malaise/fatigue and weight loss.  Respiratory: Negative.  Negative for cough, hemoptysis and shortness of breath.   Cardiovascular: Negative.  Negative for chest pain and leg swelling.  Gastrointestinal: Negative.  Negative for abdominal pain.  Genitourinary: Negative.  Negative for dysuria.  Musculoskeletal: Negative.  Negative for back pain.  Skin: Negative.  Negative for rash.  Neurological: Negative.  Negative for tingling, sensory change, focal weakness, weakness and headaches.  Psychiatric/Behavioral: Negative.  The patient is not nervous/anxious.     As per HPI. Otherwise, a complete review of systems is negative.  PAST MEDICAL HISTORY: Past Medical History:  Diagnosis Date  . Dermoid tumor    hx of--left ovary  . Gastric bypass status for obesity 2004   s/p roux-en-y  . Generalized headaches    frequent  . Heart murmur   . History of  cardiac murmur   . Intraductal papilloma of breast, left 06/18/2017   05/2017 - s/p excision 07/2017  . Osteoporosis 03/2016   DEXA -2.6 spine  . Salivary gland stone    x 2  . Smoker     PAST SURGICAL HISTORY: Past Surgical History:  Procedure Laterality Date  . ABDOMINAL HYSTERECTOMY     only ovary was removed  . ANTERIOR CERVICAL DECOMP/DISCECTOMY FUSION  12/2016   Pool  . APPENDECTOMY  1980  . BILATERAL OOPHORECTOMY  ~1992   for dermoid ovarian cysts  . BREAST BIOPSY Left 06/13/2017   papilloma with excision 07/2017  . BREAST LUMPECTOMY WITH NEEDLE LOCALIZATION Left 08/10/2017   intraductal papilloma - Jules Husbands, MD  . CHOLECYSTECTOMY  2000s  . COLONOSCOPY WITH PROPOFOL N/A 11/27/2015   TA rpt 5 yrs Lucilla Lame, MD)  . GASTRIC BYPASS  2004   roux en y [facility])  . POLYPECTOMY  11/27/2015   Procedure: POLYPECTOMY;  Surgeon: Lucilla Lame, MD;  Location: [place];  Service: Endoscopy;;  . REDUCTION MAMMAPLASTY Bilateral 1990's  . SALIVARY GLAND SURGERY    . SALIVARY STONE REMOVAL      FAMILY HISTORY: Family History  Problem Relation Age of Onset  . Cancer Father 69       leukemia  . Hypertension Mother   . Diabetes Neg Hx   . Stroke Neg Hx   . Coronary artery disease Neg Hx   . Breast cancer Neg Hx     ADVANCED DIRECTIVES (Y/N):  N  HEALTH MAINTENANCE: Social History   Tobacco Use  . Smoking status: Former Smoker  Packs/day: 0.25    Years: 20.00    Pack years: 5.00    Types: Cigarettes    Quit date: 10/31/2012    Years since quitting: 7.5  . Smokeless tobacco: Never Used  Vaping Use  . Vaping Use: Never used  Substance Use Topics  . Alcohol use: Not Currently  . Drug use: No     Colonoscopy:  PAP:  Bone density:  Lipid panel:  Allergies  Allergen Reactions  . Boniva [Ibandronic Acid] Other (See Comments)    Diffuse body aches    Current Outpatient Medications  Medication Sig Dispense Refill  . calcium carbonate (OS-CAL) 600  MG TABS tablet Take 600 mg by mouth daily with breakfast.    . Cholecalciferol (VITAMIN D3) 1000 units CAPS Take 3 capsules (3,000 Units total) by mouth daily.    Marland Kitchen denosumab (PROLIA) 60 MG/ML SOSY injection Inject 60 mg into the skin every 6 (six) months. 1 mL 1  . ferrous sulfate 325 (65 FE) MG tablet Take 1 tablet (325 mg total) by mouth every Monday, Wednesday, and Friday.    . Magnesium 250 MG TABS Take 1 tablet (250 mg total) by mouth daily.  0  . methocarbamol (ROBAXIN) 500 MG tablet Take 1 tablet (500 mg total) by mouth 3 (three) times daily as needed for muscle spasms (sedation precautions). 40 tablet 1  . prochlorperazine (COMPAZINE) 10 MG tablet Take 1 tablet (10 mg total) by mouth every 6 (six) hours as needed for nausea or vomiting. 60 tablet 0  . sulfamethoxazole-trimethoprim (BACTRIM DS) 800-160 MG tablet Take 1 tablet by mouth 2 (two) times daily. 6 tablet 0  . vitamin B-12 (CYANOCOBALAMIN) 1000 MCG tablet Take 1 tablet (1,000 mcg total) by mouth daily.    Marland Kitchen zinc gluconate 50 MG tablet Take 1 tablet (50 mg total) by mouth daily.    Marland Kitchen ascorbic acid 1000 MG tablet Take 1 tablet (1,000 mg total) by mouth daily. (Patient not taking: Reported on 05/21/2020)     No current facility-administered medications for this visit.    OBJECTIVE: Vitals:   05/21/20 0925  BP: 117/76  Pulse: 62  Resp: 18  Temp: 97.6 F (36.4 C)  SpO2: 98%     Body mass index is 27.01 kg/m.    ECOG FS:0 - Asymptomatic  General: Well-developed, well-nourished, no acute distress. Eyes: Pink conjunctiva, anicteric sclera. HEENT: Normocephalic, moist mucous membranes. Lungs: No audible wheezing or coughing. Heart: Regular rate and rhythm. Abdomen: Soft, nontender, no obvious distention. Musculoskeletal: No edema, cyanosis, or clubbing. Neuro: Alert, answering all questions appropriately. Cranial nerves grossly intact. Skin: No rashes or petechiae noted. Psych: Normal affect.  LAB RESULTS:  Lab Results   Component Value Date   NA 138 05/21/2020   K 5.4 (H) 05/21/2020   CL 103 05/21/2020   CO2 25 05/21/2020   GLUCOSE 88 05/21/2020   BUN 18 05/21/2020   CREATININE 0.77 05/21/2020   CALCIUM 9.1 05/21/2020   PROT 6.8 05/21/2020   ALBUMIN 4.1 05/21/2020   AST 29 05/21/2020   ALT 49 (H) 05/21/2020   ALKPHOS 56 05/21/2020   BILITOT 1.2 05/21/2020   GFRNONAA >60 05/21/2020   GFRAA >60 07/10/2013    Lab Results  Component Value Date   WBC 6.5 05/21/2020   NEUTROABS 4.5 05/21/2020   HGB 14.9 05/21/2020   HCT 45.2 05/21/2020   MCV 93.4 05/21/2020   PLT 185 05/21/2020     STUDIES: [place] FNA BX THYROID 1ST LESION Marijean Niemann  Result Date: 05/13/2020 INDICATION: Indeterminate thyroid nodules. Recent diagnosis of lymphoma, found to have a hypermetabolic left-sided thyroid nodule on PET-CT performed 04/14/2020. Subsequent thyroid ultrasound performed 05/01/2020 demonstrates two thyroid nodules which meet imaging criteria to recommend percutaneous sampling. EXAM: ULTRASOUND GUIDED FINE NEEDLE ASPIRATION OF INDETERMINATE THYROID NODULE COMPARISON:  Thyroid ultrasound-05/01/2020; PET-CT-04/14/2020 MEDICATIONS: None COMPLICATIONS: None immediate. TECHNIQUE: Informed written consent was obtained from the patient after a discussion of the risks, benefits and alternatives to treatment. Questions regarding the procedure were encouraged and answered. A timeout was performed prior to the initiation of the procedure. Pre-procedural ultrasound scanning demonstrated unchanged size and appearance of the indeterminate nodules within the left lobe of the thyroid. The procedure was planned. The neck was prepped in the usual sterile fashion, and a sterile drape was applied covering the operative field. A timeout was performed prior to the initiation of the procedure. Local anesthesia was provided with 1% lidocaine. Under direct ultrasound guidance, 7 FNA biopsies were performed of the indeterminate nodule within the  inferior pole the left lobe of the thyroid with a 25 gauge needle. Multiple ultrasound images were saved for procedural documentation purposes. The samples were prepared and submitted to pathology. Final 2 samples were set aside potential molecular testing. Under direct ultrasound guidance, 7 FNA biopsies were performed of the indeterminate nodule within the mid aspect the left lobe of the thyroid with a 25 gauge needle. Multiple ultrasound images were saved for procedural documentation purposes. The samples were prepared and submitted to pathology. Final 2 samples were set aside for potential molecular testing. Limited post procedural scanning was negative for hematoma or additional complication. Dressings were placed. The patient tolerated the above procedures procedure well without immediate postprocedural complication. FINDINGS: Nodule reference number based on prior diagnostic ultrasound: 5 Maximum size: 1.8 Location: Left; Inferior ACR TI-RADS risk category: TR5 (>/= 7 points) Reason for biopsy: meets ACR TI-RADS criteria _________________________________________________________ Nodule reference number based on prior diagnostic ultrasound: 4 Maximum size: 1.9 Location: Left; Mid ACR TI-RADS risk category: TR4 (4-6 points) Reason for biopsy: meets other recommendations Ultrasound imaging confirms appropriate placement of the needles within the thyroid nodule. IMPRESSION: 1. Technically successful ultrasound guided fine needle aspiration of indeterminate nodule within the inferior pole the left lobe of the thyroid (labeled 5). 2. Technically successful ultrasound guided fine needle aspiration of indeterminate nodule within mid aspect the left lobe of the thyroid (labeled 4). Electronically Signed   By: Sandi Mariscal M.D.   On: 05/13/2020 16:06   US THYROID  Result Date: 05/01/2020 CLINICAL DATA:  Hypermetabolic thyroid nodule on PET-CT imaging. Lymphoma. EXAM: THYROID ULTRASOUND TECHNIQUE: Ultrasound  examination of the thyroid gland and adjacent soft tissues was performed. COMPARISON:  PET-CT 04/14/2020 FINDINGS: Parenchymal Echotexture: Mildly heterogenous Isthmus: 0.3 cm Right lobe: 3.4 x 1.7 x 1.5 cm Left lobe: 4.7 x 2.1 x 1.9 cm _________________________________________________________ Estimated total number of nodules >/= 1 cm: 3 Number of spongiform nodules >/=  2 cm not described below (TR1): 0 Number of mixed cystic and solid nodules >/= 1.5 cm not described below ([place]): 0 _________________________________________________________ Nodule # 2: Location: Right; Mid Maximum size: 1.3 cm; Other 2 dimensions: 0.8 x 0.8 cm Composition: cannot determine (2) Echogenicity: hypoechoic (2) Shape: not taller-than-wide (0) Margins: ill-defined (0) Echogenic foci: macrocalcifications (1) ACR TI-RADS total points: 5. ACR TI-RADS risk category: TR4 (4-6 points). ACR TI-RADS recommendations: *Given size (>/= 1 - 1.4 cm) and appearance, a follow-up ultrasound in 1 year should be considered based on TI-RADS  criteria. _________________________________________________________ Nodule # 4: Location: Left; Mid Maximum size: 1.9 cm; Other 2 dimensions: 1.5 x 1.6 cm Composition: solid/almost completely solid (2) Echogenicity: hypoechoic (2) Shape: not taller-than-wide (0) Margins: smooth (0) Echogenic foci: none (0) ACR TI-RADS total points: 4. ACR TI-RADS risk category: TR4 (4-6 points). ACR TI-RADS recommendations: **Given size (>/= 1.5 cm) and appearance, fine needle aspiration of this moderately suspicious nodule should be considered based on TI-RADS criteria. _________________________________________________________ Nodule # 5: Corresponds with the hypermetabolic nodule on the PET-CT. Location: Left; Inferior Maximum size: 1.8 cm; Other 2 dimensions: 1.5 x 1.6 cm Composition: solid/almost completely solid (2) Echogenicity: hypoechoic (2) Shape: taller-than-wide (3) Margins: smooth (0) Echogenic foci: none (0) ACR TI-RADS  total points: 7. ACR TI-RADS risk category: TR5 (>/= 7 points). ACR TI-RADS recommendations: **Given size (>/= 1.0 cm) and appearance, fine needle aspiration of this highly suspicious nodule should be considered based on TI-RADS criteria. _________________________________________________________ IMPRESSION: 1. Bilateral thyroid nodules. 2. Nodule #5 in the left inferior thyroid lobe corresponds with the hypermetabolic nodule on PET-CT. This is a TR 5 highly suspicious nodule. Recommend biopsy of nodule #5 in the left inferior thyroid lobe. 3. Nodule #4 in the left mid thyroid lobe is a TR 4 nodule that meets criteria for biopsy. 4. Nodule #2 in the right mid thyroid lobe meets criteria for 1 year follow-up. The above is in keeping with the ACR TI-RADS recommendations - J Am Coll Radiol 2017;14:587-595. Electronically Signed   By: Markus Daft M.D.   On: 05/01/2020 16:03   [place] FNA BIOPSY THYROID EA ADD LESION AFIRMA  Result Date: 05/13/2020 INDICATION: Indeterminate thyroid nodules. Recent diagnosis of lymphoma, found to have a hypermetabolic left-sided thyroid nodule on PET-CT performed 04/14/2020. Subsequent thyroid ultrasound performed 05/01/2020 demonstrates two thyroid nodules which meet imaging criteria to recommend percutaneous sampling. EXAM: ULTRASOUND GUIDED FINE NEEDLE ASPIRATION OF INDETERMINATE THYROID NODULE COMPARISON:  Thyroid ultrasound-05/01/2020; PET-CT-04/14/2020 MEDICATIONS: None COMPLICATIONS: None immediate. TECHNIQUE: Informed written consent was obtained from the patient after a discussion of the risks, benefits and alternatives to treatment. Questions regarding the procedure were encouraged and answered. A timeout was performed prior to the initiation of the procedure. Pre-procedural ultrasound scanning demonstrated unchanged size and appearance of the indeterminate nodules within the left lobe of the thyroid. The procedure was planned. The neck was prepped in the usual sterile fashion, and  a sterile drape was applied covering the operative field. A timeout was performed prior to the initiation of the procedure. Local anesthesia was provided with 1% lidocaine. Under direct ultrasound guidance, 7 FNA biopsies were performed of the indeterminate nodule within the inferior pole the left lobe of the thyroid with a 25 gauge needle. Multiple ultrasound images were saved for procedural documentation purposes. The samples were prepared and submitted to pathology. Final 2 samples were set aside potential molecular testing. Under direct ultrasound guidance, 7 FNA biopsies were performed of the indeterminate nodule within the mid aspect the left lobe of the thyroid with a 25 gauge needle. Multiple ultrasound images were saved for procedural documentation purposes. The samples were prepared and submitted to pathology. Final 2 samples were set aside for potential molecular testing. Limited post procedural scanning was negative for hematoma or additional complication. Dressings were placed. The patient tolerated the above procedures procedure well without immediate postprocedural complication. FINDINGS: Nodule reference number based on prior diagnostic ultrasound: 5 Maximum size: 1.8 Location: Left; Inferior ACR TI-RADS risk category: TR5 (>/= 7 points) Reason for biopsy: meets ACR TI-RADS criteria  _________________________________________________________ Nodule reference number based on prior diagnostic ultrasound: 4 Maximum size: 1.9 Location: Left; Mid ACR TI-RADS risk category: TR4 (4-6 points) Reason for biopsy: meets other recommendations Ultrasound imaging confirms appropriate placement of the needles within the thyroid nodule. IMPRESSION: 1. Technically successful ultrasound guided fine needle aspiration of indeterminate nodule within the inferior pole the left lobe of the thyroid (labeled 5). 2. Technically successful ultrasound guided fine needle aspiration of indeterminate nodule within mid aspect the  left lobe of the thyroid (labeled 4). Electronically Signed   By: Sandi Mariscal M.D.   On: 05/13/2020 16:06    ASSESSMENT: At least stage III follicular lymphoma, grade 1/2.    PLAN:    1.  At least stage III follicular lymphoma, grade 1/2: Retroperitoneal lymph node biopsy on April 21, 2020 confirmed the diagnosis. PET scan on April 14, 2020 revealed a hypermetabolic right sided retroperitoneal nodal mass along with hypermetabolic mesenteric adenopathy.  Patient also noted to have bilateral axillary adenopathy.  Although patient is asymptomatic and active surveillance is an option, she wishes to pursue treatment and will receive IV Rituxan weekly x4.  We discussed the possibility of doing a bone marrow biopsy, but patient does not wish to pursue and likely would not change the overall treatment plan or prognosis.  Proceed with cycle 3 of 4 weekly Rituxan today.  Return to clinic in 1 week for further evaluation and consideration of cycle 4.  Plan to reimage with PET scan at the end of July 2022 approximately 3 months after the conclusion of her treatments. 2.  Hypermetabolic thyroid nodule: Biopsy negative for malignancy. 3.  Elevated ALT: Mildly improved. 4.  Polycythemia: Resolved.    Patient expressed understanding and was in agreement with this plan. She also understands that She can call clinic at any time with any questions, concerns, or complaints.   Cancer Staging Follicular lymphoma ([place]) Staging form: Hodgkin and Non-Hodgkin Lymphoma, AJCC 8th Edition - Clinical stage from 3/50/0938: Stage III (Follicular lymphoma) - Signed by [name], MD on 04/29/2020 Stage prefix: Initial diagnosis   [name], MD   05/21/2020 11:03 AM

## 2020-05-18 ENCOUNTER — Encounter: Payer: Self-pay | Admitting: Family Medicine

## 2020-05-18 ENCOUNTER — Other Ambulatory Visit: Payer: Self-pay

## 2020-05-18 ENCOUNTER — Telehealth: Payer: Self-pay | Admitting: *Deleted

## 2020-05-18 ENCOUNTER — Ambulatory Visit (INDEPENDENT_AMBULATORY_CARE_PROVIDER_SITE_OTHER): Payer: BC Managed Care – PPO | Admitting: Family Medicine

## 2020-05-18 VITALS — BP 116/70 | HR 55 | Temp 97.9°F | Ht 65.0 in | Wt 165.5 lb

## 2020-05-18 DIAGNOSIS — R319 Hematuria, unspecified: Secondary | ICD-10-CM | POA: Diagnosis not present

## 2020-05-18 DIAGNOSIS — E042 Nontoxic multinodular goiter: Secondary | ICD-10-CM

## 2020-05-18 DIAGNOSIS — C8218 Follicular lymphoma grade II, lymph nodes of multiple sites: Secondary | ICD-10-CM | POA: Diagnosis not present

## 2020-05-18 DIAGNOSIS — N3001 Acute cystitis with hematuria: Secondary | ICD-10-CM | POA: Insufficient documentation

## 2020-05-18 LAB — POC URINALSYSI DIPSTICK (AUTOMATED)
Bilirubin, UA: NEGATIVE
Glucose, UA: NEGATIVE
Ketones, UA: NEGATIVE
Nitrite, UA: NEGATIVE
Protein, UA: NEGATIVE
Spec Grav, UA: 1.015 (ref 1.010–1.025)
Urobilinogen, UA: 0.2 E.U./dL
pH, UA: 6 (ref 5.0–8.0)

## 2020-05-18 MED ORDER — SULFAMETHOXAZOLE-TRIMETHOPRIM 800-160 MG PO TABS: 1.0000 | ORAL_TABLET | Freq: Two times a day (BID) | ORAL | 0 refills | Status: DC

## 2020-05-18 NOTE — Telephone Encounter (Signed)
Patient called reporting that she feels that she has "an infection in the urine" She just had chemotherapy on Thursday. She is requesting a return call. Please advise

## 2020-05-18 NOTE — Progress Notes (Signed)
Patient ID: Melinda Hall, female    DOB: January 02, 1961, 60 y.o.   MRN: [id number]  This visit was conducted in person.  BP 116/70   Pulse (!) 55   Temp 97.9 F (36.6 C) (Temporal)   Ht 5\' 5"  (1.651 m)   Wt 165 lb 8 oz (75.1 kg)   SpO2 100%   BMI 27.54 kg/m    CC: hematuria  Subjective:   HPI: Melinda Hall is a 60 y.o. female presenting on 05/18/2020 for Hematuria (C/o blood in her urine and low abd pain when urinating.  Sxs started yesterday.  Tried increasing water, helpful. )   1d h/o blood noted when wiping after urination. Dysuria started today with urgency/frequency and some incomplete emptying and bladder incontinence. Some lower back pain and nausea. Did not see blood in the urine.  No fevers/chills, bowel changes, lower abdominal discomfort or vomiting.  Increasing water has helped.   No recent UTI.  No recent abx use.   Recent chemo start for recently diagnosed follicular lymphoma (IV Rituxan weekly).  Fortunately recent thyroid biopsy x2 returned benign (follicular thyroid nodules).      Relevant past medical, surgical, family and social history reviewed and updated as indicated. Interim medical history since our last visit reviewed. Allergies and medications reviewed and updated. Outpatient Medications Prior to Visit  Medication Sig Dispense Refill  . ascorbic acid 1000 MG tablet Take 1 tablet (1,000 mg total) by mouth daily.    . calcium carbonate (OS-CAL) 600 MG TABS tablet Take 600 mg by mouth daily with breakfast.    . Cholecalciferol (VITAMIN D3) 1000 units CAPS Take 3 capsules (3,000 Units total) by mouth daily.    Marland Kitchen denosumab (PROLIA) 60 MG/ML SOSY injection Inject 60 mg into the skin every 6 (six) months. 1 mL 1  . ferrous sulfate 325 (65 FE) MG tablet Take 1 tablet (325 mg total) by mouth every Monday, Wednesday, and Friday.    . Magnesium 250 MG TABS Take 1 tablet (250 mg total) by mouth daily.  0  . methocarbamol (ROBAXIN) 500 MG tablet Take 1 tablet  (500 mg total) by mouth 3 (three) times daily as needed for muscle spasms (sedation precautions). 40 tablet 1  . prochlorperazine (COMPAZINE) 10 MG tablet Take 1 tablet (10 mg total) by mouth every 6 (six) hours as needed for nausea or vomiting. 60 tablet 0  . vitamin B-12 (CYANOCOBALAMIN) 1000 MCG tablet Take 1 tablet (1,000 mcg total) by mouth daily.    Marland Kitchen zinc gluconate 50 MG tablet Take 1 tablet (50 mg total) by mouth daily.     No facility-administered medications prior to visit.     Per HPI unless specifically indicated in ROS section below Review of Systems Objective:  BP 116/70   Pulse (!) 55   Temp 97.9 F (36.6 C) (Temporal)   Ht 5\' 5"  (1.651 m)   Wt 165 lb 8 oz (75.1 kg)   SpO2 100%   BMI 27.54 kg/m   Wt Readings from Last 3 Encounters:  05/18/20 165 lb 8 oz (75.1 kg)  05/14/20 162 lb 14.4 oz (73.9 kg)  05/07/20 164 lb 8 oz (74.6 kg)      Physical Exam Vitals and nursing note reviewed.  Constitutional:      Appearance: Normal appearance. She is not ill-appearing.  Abdominal:     General: Abdomen is flat. Bowel sounds are normal. There is no distension.     Palpations: Abdomen is soft. There is  no mass.     Tenderness: There is abdominal tenderness (mild-mod) in the suprapubic area. There is no right CVA tenderness, left CVA tenderness, guarding or rebound. Negative signs include Murphy's sign.     Hernia: No hernia is present.  Musculoskeletal:     Right lower leg: No edema.     Left lower leg: No edema.  Neurological:     Mental Status: She is alert.  Psychiatric:        Mood and Affect: Mood normal.        Behavior: Behavior normal.       Results for orders placed or performed in visit on 05/18/20  POCT Urinalysis Dipstick (Automated)  Result Value Ref Range   Color, UA yellow    Clarity, UA cloudy    Glucose, UA Negative Negative   Bilirubin, UA negative    Ketones, UA negative    Spec Grav, UA 1.015 1.010 - 1.025   Blood, UA 3+    pH, UA 6.0 5.0 -  8.0   Protein, UA Negative Negative   Urobilinogen, UA 0.2 0.2 or 1.0 E.U./dL   Nitrite, UA negative    Leukocytes, UA Trace (A) Negative   Lab Results  Component Value Date   CREATININE 0.56 05/14/2020   BUN 21 (H) 05/14/2020   NA 140 05/14/2020   K 4.5 05/14/2020   CL 104 05/14/2020   CO2 26 05/14/2020    Assessment & Plan:  This visit occurred during the SARS-CoV-2 public health emergency.  Safety protocols were in place, including screening questions prior to the visit, additional usage of staff PPE, and extensive cleaning of exam room while observing appropriate contact time as indicated for disinfecting solutions.   Problem List Items Addressed This Visit    Follicular lymphoma ([place])    Appreciate onc care - receiving IV Rituxan 4 wk treatment.       Relevant Medications   sulfamethoxazole-trimethoprim (BACTRIM DS) 800-160 MG tablet   Multiple thyroid nodules    Fortunately biopsies x2 returned consistent with benign follicular nodules.      Acute cystitis with hematuria - Primary    1d duration of dysuria, urgency, frequency, and blood with wiping. Anticipate acute cystitis. Rx bactrim 3d course. Update if persistent symptoms despite treatment. UCx sent as well. Supportive care as per instructions.       Relevant Orders   Urine Culture    Other Visit Diagnoses    Hematuria, unspecified type       Relevant Orders   POCT Urinalysis Dipstick (Automated) (Completed)       Meds ordered this encounter  Medications  . sulfamethoxazole-trimethoprim (BACTRIM DS) 800-160 MG tablet    Sig: Take 1 tablet by mouth 2 (two) times daily.    Dispense:  6 tablet    Refill:  0   Orders Placed This Encounter  Procedures  . Urine Culture  . 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  Siga tomando [employer].  Puede usar tylenol para dolor.  Dejenos saber si no mejora  con tratamiento.   Follow up plan: Return if symptoms worsen or fail to improve.  [name], MD

## 2020-05-18 NOTE — Telephone Encounter (Signed)
Seen today for UTI.

## 2020-05-18 NOTE — Assessment & Plan Note (Addendum)
1d duration of dysuria, urgency, frequency, and blood with wiping. Anticipate acute cystitis. Rx bactrim 3d course. Update if persistent symptoms despite treatment. UCx sent as well. Supportive care as per instructions.

## 2020-05-18 NOTE — Assessment & Plan Note (Signed)
Fortunately biopsies x2 returned consistent with benign follicular nodules.

## 2020-05-18 NOTE — Patient Instructions (Signed)
Creo que tiene infeccion [place] - tome antibiotico que le he mandado a Engineer, production.  Siga tomando [employer].  Puede usar tylenol para dolor.  Dejenos saber si no mejora con tratamiento.   Infeccin urinaria en los adultos Urinary Tract Infection, Adult  Una infeccin urinaria (IU) puede ocurrir en Clinical cytogeneticist de las vas [place]. Las vas urinarias incluyen a los riones, los urteres, la vejiga y Geologist, engineering. Estos rganos fabrican, Buyer, retail y eliminan la orina del organismo. La IU alta afecta los urteres y los riones. La IU baja afecta la vejiga y Geologist, engineering. Cules son las causas? La mayora de las infecciones de las vas urinarias es causada por la presencia de bacterias en la zona genital, alrededor de la uretra, por donde sale la orina del cuerpo. Estas bacterias proliferan y causan inflamacin en las vas urinarias. Qu incrementa el riesgo? Es ms probable que sufra esta afeccin si:  Tiene colocado un catter [employer].  No puede controlar cundo Production assistant, radio (incontinencia).  Es mujer y usted: ? [place] espermicida o diafragma como mtodo anticonceptivo. ? Tiene niveles bajos de estrgenos. ? Est embarazada.  Tiene ciertos genes que [employer].  Es sexualmente activa.  Toma antibiticos.  Tiene una afeccin que causa que el flujo de orina sea [place], como: ? Prstata agrandada, si usted es hombre. ? Obstruccin de Geologist, engineering. ? Clculo renal. ? Una afeccin nerviosa que afecta el control de la vejiga (vejiga neurgena). ? No bebe lo suficiente o no orina con frecuencia.  Tiene ciertas enfermedades crnicas, como: ? Diabetes. ? Un sistema que combate las enfermedades (sistemainmunitario) debilitado. ? Anemia drepanoctica. ? Gota. ? Lesin en la mdula espinal. Cules son los signos o sntomas? Los sntomas de esta afeccin incluyen:  Necesidad inmediata (urgencia) de Garment/textile technologist.  Miccin frecuente. Esto puede incluir pequeas  cantidades de [place] cada vez que [place].  Ardor o dolor al [employer].  Presencia de [employer].  Orina con mal olor u [employer] atpico.  Dificultad para Garment/textile technologist.  Bennie Hind turbia.  Secrecin vaginal, si es mujer.  Dolor en el abdomen o en la parte inferior de la espalda. Es posible que tambin tenga:  Vmitos o disminucin del apetito.  Confusin.  Irritabilidad o cansancio.  Fiebre o escalofros.  Diarrea. El Psychologist, educational sntoma en los adultos mayores puede ser la confusin. En algunos casos, es posible que no tengan sntomas hasta que la infeccin empeore. Cmo se diagnostica? Esta afeccin se diagnostica en funcin de sus antecedentes mdicos y de un examen fsico. Tambin pueden hacerle otras pruebas, incluidas las siguientes:  Anlisis de [place].  Anlisis de [place].  Pruebas de infecciones de transmisin sexual (ITS). Si ha tenido ms de una infeccin urinaria (IU), se pueden hacer estudios de diagnstico por imgenes o una cistoscopia para determinar la causa de las infecciones. Cmo se trata? El tratamiento de esta afeccin incluye lo siguiente:  Antibiticos.  Medicamentos de [place] molestias.  Beber una cantidad suficiente agua para mantenerse hidratado. Si tiene infecciones con frecuencia o tiene otras afecciones, como un clculo renal, es posible que deba ver a un mdico especialista en las vas urinarias (urlogo). En casos poco frecuentes, las infecciones urinarias pueden provocar sepsis. La sepsis es una afeccin potencialmente mortal que se produce cuando el cuerpo responde a una infeccin. La sepsis se trata en el hospital con antibiticos, lquidos y otros medicamentos que se administran por va intravenosa. Siga estas instrucciones en su casa: Medicamentos  Use los medicamentos de venta libre y los recetados solamente como se lo haya indicado el mdico.  Si le recetaron un antibitico, tmelo como se lo haya indicado el mdico. No  deje de usar el antibitico aunque comience a Sports administrator. Instrucciones generales  Asegrese de hacer lo siguiente: ? Vaciar la vejiga con frecuencia y en su totalidad. No contener la orina durante largos perodos. ? Vaciar la vejiga despus de [employer]. ? Limpiarse de atrs hacia adelante despus de orinar o defecar, si es mujer. Usar cada trozo de papel higinico solo una vez cuando se limpie.  Beber suficiente lquido como para Theatre manager la orina de color amarillo plido.  Cumpla con todas las visitas de seguimiento. Esto es importante.   Comunquese con un mdico si:  Los sntomas no mejoran despus de 1 o 2das de tratamiento.  Los sntomas desaparecen y luego vuelven a Arts administrator. Solicite ayuda de inmediato si:  Siente dolor intenso en la espalda o en la parte inferior del abdomen.  Tiene fiebre o escalofros.  Tiene nuseas o vmitos. Resumen  Una infeccin urinaria (IU) es una infeccin en cualquier parte de las vas urinarias, que [employer] riones, los urteres, la vejiga y Geologist, engineering.  [address] infecciones de las vas urinarias es causada por bacterias en la zona genital.  El tratamiento de esta afeccin suele incluir antibiticos.  Si le recetaron un antibitico, tmelo como se lo haya indicado el mdico. No deje de usar el antibitico aunque comience a Sports administrator.  Cumpla con todas las visitas de seguimiento. Esto es importante. Esta informacin no tiene Marine scientist el consejo del mdico. Asegrese de hacerle al mdico cualquier pregunta que tenga. Document Revised: 11/11/2019 Document Reviewed: 11/11/2019 Elsevier Patient Education  2021 [employer].

## 2020-05-18 NOTE — Telephone Encounter (Signed)
Patient had also called her PCP who is seeing her toay

## 2020-05-18 NOTE — Assessment & Plan Note (Signed)
Appreciate onc care - receiving IV Rituxan 4 wk treatment.

## 2020-05-18 NOTE — Telephone Encounter (Signed)
Does she want to come provide a urine sample and that we can do a virtual visit with results and treatment if needed?

## 2020-05-19 LAB — URINE CULTURE
MICRO NUMBER:: 11781705
SPECIMEN QUALITY:: ADEQUATE

## 2020-05-21 ENCOUNTER — Inpatient Hospital Stay: Payer: BC Managed Care – PPO

## 2020-05-21 ENCOUNTER — Inpatient Hospital Stay (HOSPITAL_BASED_OUTPATIENT_CLINIC_OR_DEPARTMENT_OTHER): Payer: BC Managed Care – PPO | Admitting: Oncology

## 2020-05-21 ENCOUNTER — Other Ambulatory Visit: Payer: Self-pay

## 2020-05-21 ENCOUNTER — Encounter: Payer: Self-pay | Admitting: Oncology

## 2020-05-21 VITALS — BP 117/76 | HR 62 | Temp 97.6°F | Resp 18 | Ht 65.0 in | Wt 162.3 lb

## 2020-05-21 VITALS — BP 117/78 | HR 53 | Temp 98.0°F | Resp 16

## 2020-05-21 DIAGNOSIS — C8218 Follicular lymphoma grade II, lymph nodes of multiple sites: Secondary | ICD-10-CM | POA: Diagnosis not present

## 2020-05-21 DIAGNOSIS — Z79899 Other long term (current) drug therapy: Secondary | ICD-10-CM | POA: Diagnosis not present

## 2020-05-21 DIAGNOSIS — C8200 Follicular lymphoma grade I, unspecified site: Secondary | ICD-10-CM | POA: Diagnosis not present

## 2020-05-21 DIAGNOSIS — Z5112 Encounter for antineoplastic immunotherapy: Secondary | ICD-10-CM | POA: Diagnosis not present

## 2020-05-21 LAB — COMPREHENSIVE METABOLIC PANEL
ALT: 49 U/L — ABNORMAL HIGH (ref 0–44)
AST: 29 U/L (ref 15–41)
Albumin: 4.1 g/dL (ref 3.5–5.0)
Alkaline Phosphatase: 56 U/L (ref 38–126)
Anion gap: 10 (ref 5–15)
BUN: 18 mg/dL (ref 6–20)
CO2: 25 mmol/L (ref 22–32)
Calcium: 9.1 mg/dL (ref 8.9–10.3)
Chloride: 103 mmol/L (ref 98–111)
Creatinine, Ser: 0.77 mg/dL (ref 0.44–1.00)
GFR, Estimated: >60 mL/min (ref >60)
Glucose, Bld: 88 mg/dL (ref 70–99)
Potassium: 5.4 mmol/L — ABNORMAL HIGH (ref 3.5–5.1)
Sodium: 138 mmol/L (ref 135–145)
Total Bilirubin: 1.2 mg/dL (ref 0.3–1.2)
Total Protein: 6.8 g/dL (ref 6.5–8.1)

## 2020-05-21 LAB — CBC WITH DIFFERENTIAL/PLATELET
Abs Immature Granulocytes: 0.02 10*3/uL (ref 0.00–0.07)
Basophils Absolute: 0 10*3/uL (ref 0.0–0.1)
Basophils Relative: 0 %
Eosinophils Absolute: 0.1 10*3/uL (ref 0.0–0.5)
Eosinophils Relative: 1 %
HCT: 45.2 % (ref 36.0–46.0)
Hemoglobin: 14.9 g/dL (ref 12.0–15.0)
Immature Granulocytes: 0 %
Lymphocytes Relative: 17 %
Lymphs Abs: 1.1 10*3/uL (ref 0.7–4.0)
MCH: 30.8 pg (ref 26.0–34.0)
MCHC: 33 g/dL (ref 30.0–36.0)
MCV: 93.4 fL (ref 80.0–100.0)
Monocytes Absolute: 0.8 10*3/uL (ref 0.1–1.0)
Monocytes Relative: 12 %
Neutro Abs: 4.5 10*3/uL (ref 1.7–7.7)
Neutrophils Relative %: 70 %
Platelets: 185 10*3/uL (ref 150–400)
RBC: 4.84 MIL/uL (ref 3.87–5.11)
RDW: 13.5 % (ref 11.5–15.5)
WBC: 6.5 10*3/uL (ref 4.0–10.5)
nRBC: 0 % (ref 0.0–0.2)

## 2020-05-21 MED ORDER — DIPHENHYDRAMINE HCL 25 MG PO CAPS
25.0000 mg | ORAL_CAPSULE | Freq: Once | ORAL | Status: AC
  Administered 2020-05-21: 25 mg via ORAL
  Filled 2020-05-21: qty 1

## 2020-05-21 MED ORDER — ACETAMINOPHEN 325 MG PO TABS
650.0000 mg | ORAL_TABLET | Freq: Once | ORAL | Status: AC
  Administered 2020-05-21: 650 mg via ORAL
  Filled 2020-05-21: qty 2

## 2020-05-21 MED ORDER — SODIUM CHLORIDE 0.9 % IV SOLN: 375.0000 mg/m2 | Freq: Once | INTRAVENOUS | Status: DC

## 2020-05-21 MED ORDER — SODIUM CHLORIDE 0.9 % IV SOLN
Freq: Once | INTRAVENOUS | Status: AC
  Filled 2020-05-21: qty 250

## 2020-05-21 MED ORDER — SODIUM CHLORIDE 0.9 % IV SOLN
375.0000 mg/m2 | Freq: Once | INTRAVENOUS | Status: AC
  Administered 2020-05-21: 700 mg via INTRAVENOUS
  Filled 2020-05-21: qty 50

## 2020-05-24 NOTE — Progress Notes (Signed)
[facility]  [phone] [phone]  ID: Melinda Hall OB: Jun 20, 1960  MR#: [id number]  LOV#:[id number]  Patient Care Team: [name], MD as PCP - General (Family Medicine)  CHIEF COMPLAINT: At least stage III follicular lymphoma, grade 1/2.    INTERVAL HISTORY: Patient returns to clinic today for further evaluation and consideration of cycle 4 of 4 of weekly Rituxan.  Patient complains of increased fatigue, but otherwise is tolerating her treatments well. She has no neurologic complaints.  She denies any recent fevers or illnesses.  She denies any night sweats.  She has no chest pain, shortness of breath, cough, or hemoptysis.  She denies any nausea, vomiting, constipation, or diarrhea.  She has no urinary complaints.  Patient offers no specific complaints today.  REVIEW OF SYSTEMS:   Review of Systems  Constitutional: Negative.  Negative for fever, malaise/fatigue and weight loss.  Respiratory: Negative.  Negative for cough, hemoptysis and shortness of breath.   Cardiovascular: Negative.  Negative for chest pain and leg swelling.  Gastrointestinal: Negative.  Negative for abdominal pain.  Genitourinary: Negative.  Negative for dysuria.  Musculoskeletal: Negative.  Negative for back pain.  Skin: Negative.  Negative for rash.  Neurological: Negative.  Negative for tingling, sensory change, focal weakness, weakness and headaches.  Psychiatric/Behavioral: Negative.  The patient is not nervous/anxious.     As per HPI. Otherwise, a complete review of systems is negative.  PAST MEDICAL HISTORY: Past Medical History:  Diagnosis Date  . Dermoid tumor    hx of--left ovary  . Gastric bypass status for obesity 2004   s/p roux-en-y  . Generalized headaches    frequent  . Heart murmur   . History of cardiac murmur   . Intraductal papilloma of breast, left 06/18/2017   05/2017 - s/p excision 07/2017  . Osteoporosis 03/2016   DEXA -2.6 spine   . Salivary gland stone    x 2  . Smoker     PAST SURGICAL HISTORY: Past Surgical History:  Procedure Laterality Date  . ABDOMINAL HYSTERECTOMY     only ovary was removed  . ANTERIOR CERVICAL DECOMP/DISCECTOMY FUSION  12/2016   Pool  . APPENDECTOMY  1980  . BILATERAL OOPHORECTOMY  ~1992   for dermoid ovarian cysts  . BREAST BIOPSY Left 06/13/2017   papilloma with excision 07/2017  . BREAST LUMPECTOMY WITH NEEDLE LOCALIZATION Left 08/10/2017   intraductal papilloma - Jules Husbands, MD  . CHOLECYSTECTOMY  2000s  . COLONOSCOPY WITH PROPOFOL N/A 11/27/2015   TA rpt 5 yrs Lucilla Lame, MD)  . GASTRIC BYPASS  2004   roux en y [facility])  . POLYPECTOMY  11/27/2015   Procedure: POLYPECTOMY;  Surgeon: Lucilla Lame, MD;  Location: [facility];  Service: Endoscopy;;  . REDUCTION MAMMAPLASTY Bilateral 1990's  . SALIVARY GLAND SURGERY    . SALIVARY STONE REMOVAL      FAMILY HISTORY: Family History  Problem Relation Age of Onset  . Cancer Father 74       leukemia  . Hypertension Mother   . Diabetes Neg Hx   . Stroke Neg Hx   . Coronary artery disease Neg Hx   . Breast cancer Neg Hx     ADVANCED DIRECTIVES (Y/N):  N  HEALTH MAINTENANCE: Social History   Tobacco Use  . Smoking status: Former Smoker    Packs/day: 0.25    Years: 20.00    Pack years: 5.00    Types: Cigarettes    Quit date:  10/31/2012    Years since quitting: 7.5  . Smokeless tobacco: Never Used  Vaping Use  . Vaping Use: Never used  Substance Use Topics  . Alcohol use: Not Currently  . Drug use: No     Colonoscopy:  PAP:  Bone density:  Lipid panel:  Allergies  Allergen Reactions  . Boniva [Ibandronic Acid] Other (See Comments)    Diffuse body aches    Current Outpatient Medications  Medication Sig Dispense Refill  . calcium carbonate (OS-CAL) 600 MG TABS tablet Take 600 mg by mouth daily with breakfast.    . Cholecalciferol (VITAMIN D3) 1000 units CAPS Take 3 capsules (3,000 Units total)  by mouth daily.    Marland Kitchen denosumab (PROLIA) 60 MG/ML SOSY injection Inject 60 mg into the skin every 6 (six) months. 1 mL 1  . ferrous sulfate 325 (65 FE) MG tablet Take 1 tablet (325 mg total) by mouth every Monday, Wednesday, and Friday.    . Magnesium 250 MG TABS Take 1 tablet (250 mg total) by mouth daily.  0  . methocarbamol (ROBAXIN) 500 MG tablet Take 1 tablet (500 mg total) by mouth 3 (three) times daily as needed for muscle spasms (sedation precautions). 40 tablet 1  . prochlorperazine (COMPAZINE) 10 MG tablet Take 1 tablet (10 mg total) by mouth every 6 (six) hours as needed for nausea or vomiting. 60 tablet 0  . sulfamethoxazole-trimethoprim (BACTRIM DS) 800-160 MG tablet Take 1 tablet by mouth 2 (two) times daily. 6 tablet 0  . vitamin B-12 (CYANOCOBALAMIN) 1000 MCG tablet Take 1 tablet (1,000 mcg total) by mouth daily.    Marland Kitchen zinc gluconate 50 MG tablet Take 1 tablet (50 mg total) by mouth daily.     No current facility-administered medications for this visit.    OBJECTIVE: Vitals:   05/28/20 0855  BP: 131/90  Pulse: (!) 58  Resp: 17  Temp: 97.8 F (36.6 C)  SpO2: 100%     Body mass index is 27.32 kg/m.    ECOG FS:0 - Asymptomatic  General: Well-developed, well-nourished, no acute distress. Eyes: Pink conjunctiva, anicteric sclera. HEENT: Normocephalic, moist mucous membranes. Lungs: No audible wheezing or coughing. Heart: Regular rate and rhythm. Abdomen: Soft, nontender, no obvious distention. Musculoskeletal: No edema, cyanosis, or clubbing. Neuro: Alert, answering all questions appropriately. Cranial nerves grossly intact. Skin: No rashes or petechiae noted. Psych: Normal affect.  LAB RESULTS:  Lab Results  Component Value Date   NA 142 05/28/2020   K 5.6 (H) 05/28/2020   CL 105 05/28/2020   CO2 30 05/28/2020   GLUCOSE 92 05/28/2020   BUN 15 05/28/2020   CREATININE 0.64 05/28/2020   CALCIUM 9.0 05/28/2020   PROT 6.6 05/28/2020   ALBUMIN 3.9 05/28/2020    AST 33 05/28/2020   ALT 51 (H) 05/28/2020   ALKPHOS 53 05/28/2020   BILITOT 0.9 05/28/2020   GFRNONAA >60 05/28/2020   GFRAA >60 07/10/2013    Lab Results  Component Value Date   WBC 6.6 05/28/2020   NEUTROABS 3.8 05/28/2020   HGB 14.6 05/28/2020   HCT 44.2 05/28/2020   MCV 93.6 05/28/2020   PLT 224 05/28/2020     STUDIES: [place] FNA BX THYROID 1ST LESION AFIRMA  Result Date: 05/13/2020 INDICATION: Indeterminate thyroid nodules. Recent diagnosis of lymphoma, found to have a hypermetabolic left-sided thyroid nodule on PET-CT performed 04/14/2020. Subsequent thyroid ultrasound performed 05/01/2020 demonstrates two thyroid nodules which meet imaging criteria to recommend percutaneous sampling. EXAM: ULTRASOUND GUIDED FINE NEEDLE ASPIRATION  OF INDETERMINATE THYROID NODULE COMPARISON:  Thyroid ultrasound-05/01/2020; PET-CT-04/14/2020 MEDICATIONS: None COMPLICATIONS: None immediate. TECHNIQUE: Informed written consent was obtained from the patient after a discussion of the risks, benefits and alternatives to treatment. Questions regarding the procedure were encouraged and answered. A timeout was performed prior to the initiation of the procedure. Pre-procedural ultrasound scanning demonstrated unchanged size and appearance of the indeterminate nodules within the left lobe of the thyroid. The procedure was planned. The neck was prepped in the usual sterile fashion, and a sterile drape was applied covering the operative field. A timeout was performed prior to the initiation of the procedure. Local anesthesia was provided with 1% lidocaine. Under direct ultrasound guidance, 7 FNA biopsies were performed of the indeterminate nodule within the inferior pole the left lobe of the thyroid with a 25 gauge needle. Multiple ultrasound images were saved for procedural documentation purposes. The samples were prepared and submitted to pathology. Final 2 samples were set aside potential molecular testing. Under direct  ultrasound guidance, 7 FNA biopsies were performed of the indeterminate nodule within the mid aspect the left lobe of the thyroid with a 25 gauge needle. Multiple ultrasound images were saved for procedural documentation purposes. The samples were prepared and submitted to pathology. Final 2 samples were set aside for potential molecular testing. Limited post procedural scanning was negative for hematoma or additional complication. Dressings were placed. The patient tolerated the above procedures procedure well without immediate postprocedural complication. FINDINGS: Nodule reference number based on prior diagnostic ultrasound: 5 Maximum size: 1.8 Location: Left; Inferior ACR TI-RADS risk category: TR5 (>/= 7 points) Reason for biopsy: meets ACR TI-RADS criteria _________________________________________________________ Nodule reference number based on prior diagnostic ultrasound: 4 Maximum size: 1.9 Location: Left; Mid ACR TI-RADS risk category: TR4 (4-6 points) Reason for biopsy: meets other recommendations Ultrasound imaging confirms appropriate placement of the needles within the thyroid nodule. IMPRESSION: 1. Technically successful ultrasound guided fine needle aspiration of indeterminate nodule within the inferior pole the left lobe of the thyroid (labeled 5). 2. Technically successful ultrasound guided fine needle aspiration of indeterminate nodule within mid aspect the left lobe of the thyroid (labeled 4). Electronically Signed   By: Sandi Mariscal M.D.   On: 05/13/2020 16:06   US THYROID  Result Date: 05/01/2020 CLINICAL DATA:  Hypermetabolic thyroid nodule on PET-CT imaging. Lymphoma. EXAM: THYROID ULTRASOUND TECHNIQUE: Ultrasound examination of the thyroid gland and adjacent soft tissues was performed. COMPARISON:  PET-CT 04/14/2020 FINDINGS: Parenchymal Echotexture: Mildly heterogenous Isthmus: 0.3 cm Right lobe: 3.4 x 1.7 x 1.5 cm Left lobe: 4.7 x 2.1 x 1.9 cm  _________________________________________________________ Estimated total number of nodules >/= 1 cm: 3 Number of spongiform nodules >/=  2 cm not described below (TR1): 0 Number of mixed cystic and solid nodules >/= 1.5 cm not described below ([place]): 0 _________________________________________________________ Nodule # 2: Location: Right; Mid Maximum size: 1.3 cm; Other 2 dimensions: 0.8 x 0.8 cm Composition: cannot determine (2) Echogenicity: hypoechoic (2) Shape: not taller-than-wide (0) Margins: ill-defined (0) Echogenic foci: macrocalcifications (1) ACR TI-RADS total points: 5. ACR TI-RADS risk category: TR4 (4-6 points). ACR TI-RADS recommendations: *Given size (>/= 1 - 1.4 cm) and appearance, a follow-up ultrasound in 1 year should be considered based on TI-RADS criteria. _________________________________________________________ Nodule # 4: Location: Left; Mid Maximum size: 1.9 cm; Other 2 dimensions: 1.5 x 1.6 cm Composition: solid/almost completely solid (2) Echogenicity: hypoechoic (2) Shape: not taller-than-wide (0) Margins: smooth (0) Echogenic foci: none (0) ACR TI-RADS total points: 4. ACR TI-RADS risk  category: TR4 (4-6 points). ACR TI-RADS recommendations: **Given size (>/= 1.5 cm) and appearance, fine needle aspiration of this moderately suspicious nodule should be considered based on TI-RADS criteria. _________________________________________________________ Nodule # 5: Corresponds with the hypermetabolic nodule on the PET-CT. Location: Left; Inferior Maximum size: 1.8 cm; Other 2 dimensions: 1.5 x 1.6 cm Composition: solid/almost completely solid (2) Echogenicity: hypoechoic (2) Shape: taller-than-wide (3) Margins: smooth (0) Echogenic foci: none (0) ACR TI-RADS total points: 7. ACR TI-RADS risk category: TR5 (>/= 7 points). ACR TI-RADS recommendations: **Given size (>/= 1.0 cm) and appearance, fine needle aspiration of this highly suspicious nodule should be considered based on TI-RADS criteria.  _________________________________________________________ IMPRESSION: 1. Bilateral thyroid nodules. 2. Nodule #5 in the left inferior thyroid lobe corresponds with the hypermetabolic nodule on PET-CT. This is a TR 5 highly suspicious nodule. Recommend biopsy of nodule #5 in the left inferior thyroid lobe. 3. Nodule #4 in the left mid thyroid lobe is a TR 4 nodule that meets criteria for biopsy. 4. Nodule #2 in the right mid thyroid lobe meets criteria for 1 year follow-up. The above is in keeping with the ACR TI-RADS recommendations - J Am Coll Radiol 2017;14:587-595. Electronically Signed   By: Markus Daft M.D.   On: 05/01/2020 16:03   [place] FNA BIOPSY THYROID EA ADD LESION AFIRMA  Result Date: 05/13/2020 INDICATION: Indeterminate thyroid nodules. Recent diagnosis of lymphoma, found to have a hypermetabolic left-sided thyroid nodule on PET-CT performed 04/14/2020. Subsequent thyroid ultrasound performed 05/01/2020 demonstrates two thyroid nodules which meet imaging criteria to recommend percutaneous sampling. EXAM: ULTRASOUND GUIDED FINE NEEDLE ASPIRATION OF INDETERMINATE THYROID NODULE COMPARISON:  Thyroid ultrasound-05/01/2020; PET-CT-04/14/2020 MEDICATIONS: None COMPLICATIONS: None immediate. TECHNIQUE: Informed written consent was obtained from the patient after a discussion of the risks, benefits and alternatives to treatment. Questions regarding the procedure were encouraged and answered. A timeout was performed prior to the initiation of the procedure. Pre-procedural ultrasound scanning demonstrated unchanged size and appearance of the indeterminate nodules within the left lobe of the thyroid. The procedure was planned. The neck was prepped in the usual sterile fashion, and a sterile drape was applied covering the operative field. A timeout was performed prior to the initiation of the procedure. Local anesthesia was provided with 1% lidocaine. Under direct ultrasound guidance, 7 FNA biopsies were performed  of the indeterminate nodule within the inferior pole the left lobe of the thyroid with a 25 gauge needle. Multiple ultrasound images were saved for procedural documentation purposes. The samples were prepared and submitted to pathology. Final 2 samples were set aside potential molecular testing. Under direct ultrasound guidance, 7 FNA biopsies were performed of the indeterminate nodule within the mid aspect the left lobe of the thyroid with a 25 gauge needle. Multiple ultrasound images were saved for procedural documentation purposes. The samples were prepared and submitted to pathology. Final 2 samples were set aside for potential molecular testing. Limited post procedural scanning was negative for hematoma or additional complication. Dressings were placed. The patient tolerated the above procedures procedure well without immediate postprocedural complication. FINDINGS: Nodule reference number based on prior diagnostic ultrasound: 5 Maximum size: 1.8 Location: Left; Inferior ACR TI-RADS risk category: TR5 (>/= 7 points) Reason for biopsy: meets ACR TI-RADS criteria _________________________________________________________ Nodule reference number based on prior diagnostic ultrasound: 4 Maximum size: 1.9 Location: Left; Mid ACR TI-RADS risk category: TR4 (4-6 points) Reason for biopsy: meets other recommendations Ultrasound imaging confirms appropriate placement of the needles within the thyroid nodule. IMPRESSION: 1. Technically successful ultrasound  guided fine needle aspiration of indeterminate nodule within the inferior pole the left lobe of the thyroid (labeled 5). 2. Technically successful ultrasound guided fine needle aspiration of indeterminate nodule within mid aspect the left lobe of the thyroid (labeled 4). Electronically Signed   By: Sandi Mariscal M.D.   On: 05/13/2020 16:06    ASSESSMENT: At least stage III follicular lymphoma, grade 1/2.    PLAN:    1.  At least stage III follicular lymphoma, grade  1/2: Retroperitoneal lymph node biopsy on April 21, 2020 confirmed the diagnosis. PET scan on April 14, 2020 revealed a hypermetabolic right sided retroperitoneal nodal mass along with hypermetabolic mesenteric adenopathy.  Patient also noted to have bilateral axillary adenopathy.  Although patient is asymptomatic and active surveillance is an option, she wishes to pursue treatment and will receive IV Rituxan weekly x4.  We discussed the possibility of doing a bone marrow biopsy, but patient does not wish to pursue and likely would not change the overall treatment plan or prognosis.  Patient is also not interested in maintenance Rituxan.  Proceed with cycle 4 of 4 weekly Rituxan today.  Return to clinic at the end of July 2022 with repeat PET scan and further evaluation.   2.  Hypermetabolic thyroid nodule: Biopsy negative for malignancy. 3.  Elevated ALT: Chronic and unchanged. 4.  Polycythemia: Resolved. 5.  Hyperkalemia: Monitor.  Continue follow-up with PCP as indicated.    Patient expressed understanding and was in agreement with this plan. She also understands that She can call clinic at any time with any questions, concerns, or complaints.   Cancer Staging Follicular lymphoma ([place]) Staging form: Hodgkin and Non-Hodgkin Lymphoma, AJCC 8th Edition - Clinical stage from 0/07/6224: Stage III (Follicular lymphoma) - Signed by [name], MD on 04/29/2020 Stage prefix: Initial diagnosis   [name], MD   05/28/2020 11:37 AM

## 2020-05-27 ENCOUNTER — Other Ambulatory Visit: Payer: Self-pay | Admitting: *Deleted

## 2020-05-27 DIAGNOSIS — C8218 Follicular lymphoma grade II, lymph nodes of multiple sites: Secondary | ICD-10-CM

## 2020-05-27 NOTE — Progress Notes (Signed)
c 

## 2020-05-28 ENCOUNTER — Encounter: Payer: Self-pay | Admitting: Oncology

## 2020-05-28 ENCOUNTER — Inpatient Hospital Stay (HOSPITAL_BASED_OUTPATIENT_CLINIC_OR_DEPARTMENT_OTHER): Payer: BC Managed Care – PPO | Admitting: Oncology

## 2020-05-28 ENCOUNTER — Other Ambulatory Visit: Payer: Self-pay

## 2020-05-28 ENCOUNTER — Inpatient Hospital Stay: Payer: BC Managed Care – PPO

## 2020-05-28 VITALS — BP 131/90 | HR 58 | Temp 97.8°F | Resp 17 | Ht 65.0 in | Wt 164.2 lb

## 2020-05-28 DIAGNOSIS — C8218 Follicular lymphoma grade II, lymph nodes of multiple sites: Secondary | ICD-10-CM

## 2020-05-28 DIAGNOSIS — Z5112 Encounter for antineoplastic immunotherapy: Secondary | ICD-10-CM | POA: Diagnosis not present

## 2020-05-28 DIAGNOSIS — C8200 Follicular lymphoma grade I, unspecified site: Secondary | ICD-10-CM | POA: Diagnosis not present

## 2020-05-28 DIAGNOSIS — Z79899 Other long term (current) drug therapy: Secondary | ICD-10-CM | POA: Diagnosis not present

## 2020-05-28 LAB — CBC WITH DIFFERENTIAL/PLATELET
Abs Immature Granulocytes: 0.02 10*3/uL (ref 0.00–0.07)
Basophils Absolute: 0.1 10*3/uL (ref 0.0–0.1)
Basophils Relative: 2 %
Eosinophils Absolute: 0.2 10*3/uL (ref 0.0–0.5)
Eosinophils Relative: 3 %
HCT: 44.2 % (ref 36.0–46.0)
Hemoglobin: 14.6 g/dL (ref 12.0–15.0)
Immature Granulocytes: 0 %
Lymphocytes Relative: 24 %
Lymphs Abs: 1.6 10*3/uL (ref 0.7–4.0)
MCH: 30.9 pg (ref 26.0–34.0)
MCHC: 33 g/dL (ref 30.0–36.0)
MCV: 93.6 fL (ref 80.0–100.0)
Monocytes Absolute: 0.9 10*3/uL (ref 0.1–1.0)
Monocytes Relative: 14 %
Neutro Abs: 3.8 10*3/uL (ref 1.7–7.7)
Neutrophils Relative %: 57 %
Platelets: 224 10*3/uL (ref 150–400)
RBC: 4.72 MIL/uL (ref 3.87–5.11)
RDW: 13.2 % (ref 11.5–15.5)
WBC: 6.6 10*3/uL (ref 4.0–10.5)
nRBC: 0 % (ref 0.0–0.2)

## 2020-05-28 LAB — COMPREHENSIVE METABOLIC PANEL
ALT: 51 U/L — ABNORMAL HIGH (ref 0–44)
AST: 33 U/L (ref 15–41)
Albumin: 3.9 g/dL (ref 3.5–5.0)
Alkaline Phosphatase: 53 U/L (ref 38–126)
Anion gap: 7 (ref 5–15)
BUN: 15 mg/dL (ref 6–20)
CO2: 30 mmol/L (ref 22–32)
Calcium: 9 mg/dL (ref 8.9–10.3)
Chloride: 105 mmol/L (ref 98–111)
Creatinine, Ser: 0.64 mg/dL (ref 0.44–1.00)
GFR, Estimated: >60 mL/min (ref >60)
Glucose, Bld: 92 mg/dL (ref 70–99)
Potassium: 5.6 mmol/L — ABNORMAL HIGH (ref 3.5–5.1)
Sodium: 142 mmol/L (ref 135–145)
Total Bilirubin: 0.9 mg/dL (ref 0.3–1.2)
Total Protein: 6.6 g/dL (ref 6.5–8.1)

## 2020-05-28 MED ORDER — SODIUM CHLORIDE 0.9 % IV SOLN
375.0000 mg/m2 | Freq: Once | INTRAVENOUS | Status: AC
  Administered 2020-05-28: 700 mg via INTRAVENOUS
  Filled 2020-05-28: qty 50

## 2020-05-28 MED ORDER — SODIUM CHLORIDE 0.9 % IV SOLN
Freq: Once | INTRAVENOUS | Status: AC
  Filled 2020-05-28: qty 250

## 2020-05-28 MED ORDER — ACETAMINOPHEN 325 MG PO TABS
650.0000 mg | ORAL_TABLET | Freq: Once | ORAL | Status: AC
  Administered 2020-05-28: 650 mg via ORAL
  Filled 2020-05-28: qty 2

## 2020-05-28 MED ORDER — SODIUM CHLORIDE 0.9 % IV SOLN: 375.0000 mg/m2 | Freq: Once | INTRAVENOUS | Status: DC

## 2020-05-28 MED ORDER — DIPHENHYDRAMINE HCL 25 MG PO CAPS
25.0000 mg | ORAL_CAPSULE | Freq: Once | ORAL | Status: AC
Start: 2020-05-28 — End: 2020-05-28
  Administered 2020-05-28: 25 mg via ORAL
  Filled 2020-05-28: qty 1

## 2020-05-28 NOTE — Patient Instructions (Addendum)
[facility]    Discharge Instructions: Thank you for choosing [facility] to provide your oncology and hematology care.  If you have a lab appointment with the [facility], please go directly to the [facility] and check in at the registration area.  Wear comfortable clothing and clothing appropriate for easy access to any Portacath or PICC line.   We strive to give you quality time with your provider. You may need to reschedule your appointment if you arrive late (15 or more minutes).  Arriving late affects you and other patients whose appointments are after yours.  Also, if you miss three or more appointments without notifying the office, you may be dismissed from the clinic at the provider's discretion.      For prescription refill requests, have your pharmacy contact our office and allow 72 hours for refills to be completed.    Today you received the following chemotherapy and/or immunotherapy agents: Rituximab-pvvr (Ruxience).      To help prevent nausea and vomiting after your treatment, we encourage you to take your nausea medication as directed.  BELOW ARE SYMPTOMS THAT SHOULD BE REPORTED IMMEDIATELY: . *FEVER GREATER THAN 100.4 F (38 C) OR HIGHER . *CHILLS OR SWEATING . *NAUSEA AND VOMITING THAT IS NOT CONTROLLED WITH YOUR NAUSEA MEDICATION . *UNUSUAL SHORTNESS OF BREATH . *UNUSUAL BRUISING OR BLEEDING . *URINARY PROBLEMS (pain or burning when urinating, or frequent urination) . *BOWEL PROBLEMS (unusual diarrhea, constipation, pain near the anus) . TENDERNESS IN MOUTH AND THROAT WITH OR WITHOUT PRESENCE OF ULCERS (sore throat, sores in mouth, or a toothache) . UNUSUAL RASH, SWELLING OR PAIN  . UNUSUAL VAGINAL DISCHARGE OR ITCHING   Items with * indicate a potential emergency and should be followed up as soon as possible or go to the Emergency Department if any problems should occur.  Please show the CHEMOTHERAPY ALERT CARD  or IMMUNOTHERAPY ALERT CARD at check-in to the Emergency Department and triage nurse.  Should you have questions after your visit or need to cancel or reschedule your appointment, please contact [facility]  [phone] and follow the prompts.  Office hours are 8:00 a.m. to 4:30 p.m. Monday - Friday. Please note that voicemails left after 4:00 p.m. may not be returned until the following business day.  We are closed weekends and major holidays. You have access to a nurse at all times for urgent questions. Please call the main number to the clinic [phone] and follow the prompts.  For any non-urgent questions, you may also contact your provider using MyChart. We now offer e-Visits for anyone 27 and older to request care online for non-urgent symptoms. For details visit [email or web address].   Also download the MyChart app! Go to the app store, search "MyChart", open the app, select [facility], and log in with your MyChart username and password.  Due to Covid, a mask is required upon entering the hospital/clinic. If you do not have a mask, one will be given to you upon arrival. For doctor visits, patients may have 1 support person aged 64 or older with them. For treatment visits, patients cannot have anyone with them due to current Covid guidelines and our immunocompromised population.   Rituximab Injection Qu es este medicamento? El RITUXIMAB es un anticuerpo monoclonal. Se utiliza para tratar ciertos tipos de cncer, tales como el linfoma no Hodgkin y la leucemia linfoctica crnica. Tambin se [place] para tratar la artritis reumatoide, la poliangitis granulomatosa,  la poliangitis microscpica y el pnfigo vulgar. Este medicamento puede ser utilizado para otros usos; si tiene alguna pregunta consulte con su proveedor de atencin mdica o con su farmacutico. MARCAS COMUNES: RIABNI, Rituxan, RUXIENCE Qu le debo informar a mi profesional de la salud antes de  tomar este medicamento? Necesitan saber si usted presenta alguno de los siguientes problemas o situaciones: dolor en el pecho enfermedad cardiaca infeccin, especialmente una infeccin viral, tales como varicela, fuegos labiales, hepatitis B o herpes problemas del sistema inmunolgico ritmo o frecuencia cardiaca irregular enfermedad renal recuentos sanguneos bajos (glbulos blancos, plaquetas o glbulos rojos) enfermedad pulmonar vacuna reciente o programada una reaccin alrgica o inusual al rituximab, a otros medicamentos, alimentos, colorantes o conservantes si est embarazada o buscando quedar embarazada si est amamantando a un beb Cmo debo utilizar este medicamento? Este medicamento se inyecta en una vena. Lo administra un proveedor de Geophysical data processor en un hospital o en un entorno clnico. Se le entregar una Gua del medicamento (MedGuide, su nombre en ingls) especial antes de cada tratamiento. Asegrese de leer esta informacin cada vez cuidadosamente. Hable con su proveedor de atencin mdica sobre el uso de este medicamento en nios. Aunque este medicamento se puede recetar a nios tan pequeos como de 2 aos de edad con ciertas afecciones, existen precauciones que deben tomarse. Sobredosis: Pngase en contacto inmediatamente con un centro toxicolgico o una sala de urgencia si usted cree que haya tomado demasiado medicamento. ATENCIN: [employer] es solo para usted. No comparta este medicamento con nadie. Qu sucede si me olvido de una dosis? Cumpla con las citas para dosis de seguimiento. Es importante no olvidar ninguna dosis. Llame a su proveedor de atencin mdica si no puede asistir a una cita. Qu puede interactuar con este medicamento? No use esta medicina con ninguno de los siguientes medicamentos: vacunas vivas Esta medicina tambin puede interactuar con los siguientes medicamentos: cisplatino Puede ser que esta lista no menciona todas las posibles interacciones.  Informe a su profesional de [employer]	[place] de [employer] productos a base de hierbas, medicamentos de Comfort o suplementos nutritivos que est tomando. Si usted fuma, consume bebidas alcohlicas o si utiliza drogas ilegales, indqueselo tambin a su profesional de [employer]	[place]. Algunas sustancias pueden interactuar con su medicamento. A qu debo estar atento al usar [employer]? Se supervisar su estado de salud atentamente mientras reciba este medicamento. Usted podra necesitar realizarse [employer] de sangre mientras est usando [place]. Este medicamento puede causar reacciones graves a la infusin. Para reducir Catering manager, su proveedor de atencin mdica podra darle otros medicamentos que deber usar antes de Health and safety inspector. Asegrese de seguir las instrucciones de su proveedor de Geophysical data processor. Este medicamento puede aumentar su riesgo de contraer una infeccin. Consulte con su proveedor de atencin mdica si tiene fiebre, escalofros, dolor de garganta, o cualquier otro sntoma de resfro o gripe. No se trate usted mismo. Trate de no acercarse a personas que estn enfermas. Llame a su proveedor de atencin mdica si est expuesto a cualquier persona con sarampin o varicela, o si desarrolla llagas o ampollas que no sanan correctamente. Evite usar [employer] contienen aspirina, acetaminofeno, ibuprofeno, naproxeno o ketoprofeno, a menos que as lo indique su proveedor de Geophysical data processor. Estos productos pueden ocultar la fiebre. Este medicamento puede causar reacciones graves en la piel. Pueden suceder semanas a meses despus de comenzar a Environmental consultant. Contacte a su proveedor de atencin mdica de inmediato si nota que tiene fiebre o sntomas gripales  con una erupcin. La erupcin puede ser roja o Phill Myron, y luego puede convertirse en ampollas o descamacin de la piel. O bien, es posible que observe una erupcin roja con hinchazn en la cara, los labios o los ganglios linfticos en el  cuello o debajo de los brazos. En algunos pacientes, este medicamento puede causar una infeccin grave en el cerebro que puede llevar a la muerte. Si tiene cualquier problema para ver, pensar, hablar, caminar o pararse, infrmeselo a su profesional de [employer]	[place] de inmediato. Si no puede ponerse en contacto con su profesional de la salud, busque otra fuente de atencin mdica de [place] urgente. No debe quedar [employer] est [employer], o al menos por 12 meses despus de dejar de usarlo. Las mujeres deben informar a su proveedor de atencin mdica si estn buscando quedar embarazadas o si creen que podran estar embarazadas. Existe la posibilidad de daos graves en un beb sin nacer. Para obtener ms informacin, hable con su proveedor de atencin mdica. Las [employer] deben usar un mtodo anticonceptivo confiable mientras estn recibiendo Coca[employer]a y durante 12 meses despus de dejar de usarlo. No debe amamantar a un beb mientras est tomando este medicamento, o al menos por 6 meses despus de dejar de usarlo. Qu efectos secundarios puedo tener al [employer] este medicamento? Efectos secundarios que debe informar a su proveedor de atencin mdica tan pronto como sea posible: Chief of Staff (erupcin cutnea, comezn/picazn o urticaria; hinchazn de la cara, los labios o la lengua) diarrea edema (aumento repentino de peso; hinchazn de los tobillos, los pies, las manos u otra hinchazn inusual; dificultad para Ambulance person) frecuencia cardiaca rpida e irregular ataque cardiaco (problemas para respirar; dolor u opresin en el pecho, cuello, espalda o brazos; debilidad o cansancio inusuales) infeccin (fiebre, escalofros, tos, dolor de garganta, Social research officer, government o dificultad para Garment/textile technologist) lesin renal (dificultad para orinar o cambios en la cantidad de orina) lesin en el hgado (orina amarilla oscura o [place]; sensacin general de estar enfermo o sntomas gripales; prdida del apetito; dolor  en la regin abdominal superior derecha; debilidad o cansancio inusuales; color amarillento de los ojos o la piel) presin arterial baja (mareos; sensacin de desmayo o aturdimiento; cadas; debilidad o cansancio inusuales) recuentos bajos de glbulos rojos (dificultad para respirar; sensacin de desmayo; aturdimiento; cadas; debilidad o cansancio inusuales) llagas en la boca enrojecimiento, formacin de ampollas, descamacin o distensin de la piel, incluso dentro de la boca dolor estomacal sangrado o moretones inusuales sibilancias (dificultad para respirar acompaada de sonidos fuertes o silbidos) vmito Efectos secundarios que generalmente no requieren atencin mdica (debe informarlos a su proveedor de atencin mdica si persisten o si son molestos): dolor de Special educational needs teacher en las articulaciones calambres, dolores musculares nuseas Puede ser que esta lista no menciona todos los posibles efectos secundarios. Comunquese a su mdico por asesoramiento mdico [employer]. Usted puede informar los efectos secundarios a la FDA por telfono al 1-800-FDA-1088. Dnde debo guardar mi medicina? Este medicamento se administra en hospitales o clnicas. no se guarda en su casa. ATENCIN: Este folleto es un resumen. Puede ser que no cubra toda la posible informacin. Si usted tiene preguntas acerca de esta medicina, consulte con su mdico, su farmacutico o su profesional de Technical sales engineer.  2021 Elsevier/Gold Standard (2020-01-01 00:00:00)

## 2020-06-11 ENCOUNTER — Encounter: Payer: Self-pay | Admitting: Family Medicine

## 2020-07-14 ENCOUNTER — Encounter: Payer: Self-pay | Admitting: Family Medicine

## 2020-07-14 NOTE — Telephone Encounter (Signed)
I've asked pt to drop off urine sample to dip tomorrow.

## 2020-07-14 NOTE — Telephone Encounter (Signed)
Pt walked in at front desk and wanted abx for UTI like pt had one month ago; no covid symptoms and pt was offered an appt on 07/15/20 at 9:20 with Dr Lorelei Pont. Pt told lady at front desk she did not want or need appt she wanted med sent to [facility]. Pt left office. I called pt and she said one month ago she had UTI with frequency of urine with burning upon urination. No fever, no abd or back pain and no other symptoms per pt. Pt said she just finished treatment for lymphoma one month ago. Tried to explain to pt why a urine specimen is needed. Pt said she just wants abx sent to pharmacy today because symptoms just started this morning. Pt request cb today.

## 2020-07-15 NOTE — Telephone Encounter (Addendum)
Noted.    Left message on vm per dpr, per Dr. Darnell Level, asking pt to come in to give urine sample.

## 2020-07-15 NOTE — Telephone Encounter (Signed)
Noted.    Left message on vm per dpr, per Dr. Darnell Level, asking pt to come in to give urine sample.

## 2020-07-16 NOTE — Telephone Encounter (Signed)
Noted.    Left message on vm per dpr, per Dr. Darnell Level, asking pt to come in to give urine sample.

## 2020-07-17 ENCOUNTER — Other Ambulatory Visit: Payer: Self-pay | Admitting: Family Medicine

## 2020-07-17 ENCOUNTER — Other Ambulatory Visit (INDEPENDENT_AMBULATORY_CARE_PROVIDER_SITE_OTHER): Payer: BC Managed Care – PPO

## 2020-07-17 DIAGNOSIS — R300 Dysuria: Secondary | ICD-10-CM

## 2020-07-17 LAB — POC URINALSYSI DIPSTICK (AUTOMATED)
Bilirubin, UA: NEGATIVE
Blood, UA: NEGATIVE
Glucose, UA: NEGATIVE
Ketones, UA: NEGATIVE
Leukocytes, UA: NEGATIVE
Nitrite, UA: POSITIVE
Protein, UA: NEGATIVE
Spec Grav, UA: 1.02 (ref 1.010–1.025)
Urobilinogen, UA: 1 E.U./dL
pH, UA: 6 (ref 5.0–8.0)

## 2020-07-17 MED ORDER — SULFAMETHOXAZOLE-TRIMETHOPRIM 800-160 MG PO TABS: 1.0000 | ORAL_TABLET | Freq: Two times a day (BID) | ORAL | 0 refills | Status: DC

## 2020-07-17 NOTE — Telephone Encounter (Signed)
Pt came in to give urine sample today.  UA ran, results documented (and shown to Dr. Darnell Level), and sent to Dr. Adelfa Koh, per Dr. Darnell Level.

## 2020-07-17 NOTE — Progress Notes (Signed)
Pt endorsed symptoms of dysuria, urgency, frequency, incomplete emptying without hematuria or fever or nausea.  UA/micro suspicious for infection, UCx sent and will send antibiotic to start while we await UCx results.

## 2020-07-20 ENCOUNTER — Other Ambulatory Visit: Payer: Self-pay | Admitting: Family Medicine

## 2020-07-20 LAB — URINE CULTURE
MICRO NUMBER:: 12021082
SPECIMEN QUALITY:: ADEQUATE

## 2020-07-20 NOTE — Telephone Encounter (Signed)
E coli UTI treated with bactrim 5d course.

## 2020-08-14 ENCOUNTER — Other Ambulatory Visit: Payer: Self-pay | Admitting: Family Medicine

## 2020-08-14 DIAGNOSIS — M810 Age-related osteoporosis without current pathological fracture: Secondary | ICD-10-CM

## 2020-08-24 ENCOUNTER — Ambulatory Visit
Admission: RE | Admit: 2020-08-24 | Discharge: 2020-08-24 | Disposition: A | Discharge status: Home / Self Care | Payer: BC Managed Care – PPO | Source: Ambulatory Visit | Attending: Oncology | Admitting: Oncology

## 2020-08-24 ENCOUNTER — Other Ambulatory Visit: Payer: Self-pay

## 2020-08-24 DIAGNOSIS — R1909 Other intra-abdominal and pelvic swelling, mass and lump: Secondary | ICD-10-CM | POA: Insufficient documentation

## 2020-08-24 DIAGNOSIS — C8218 Follicular lymphoma grade II, lymph nodes of multiple sites: Secondary | ICD-10-CM | POA: Diagnosis not present

## 2020-08-24 DIAGNOSIS — E041 Nontoxic single thyroid nodule: Secondary | ICD-10-CM | POA: Insufficient documentation

## 2020-08-24 DIAGNOSIS — C8590 Non-Hodgkin lymphoma, unspecified, unspecified site: Secondary | ICD-10-CM | POA: Diagnosis not present

## 2020-08-24 LAB — GLUCOSE, CAPILLARY: Glucose-Capillary: 86 mg/dL (ref 70–99)

## 2020-08-24 MED ORDER — FLUDEOXYGLUCOSE F - 18 (FDG) INJECTION
8.5000 | Freq: Once | INTRAVENOUS | Status: AC | PRN
  Administered 2020-08-24: 8.6 via INTRAVENOUS

## 2020-08-24 NOTE — Progress Notes (Signed)
[place]  [phone] Fax:(336) [phone]  ID: Melinda Hall OB: May 11, 1960  MR#: [id number]  [id number]  Patient Care Team: [name], MD as PCP - General (Family Medicine)  CHIEF COMPLAINT: At least stage III follicular lymphoma, grade 1/2.    INTERVAL HISTORY: Patient returns to clinic today for further evaluation and discussion of her PET scan results.  She currently feels well and is asymptomatic.  She does not complain of any weakness or fatigue.  She has good appetite and denies weight loss.  She has no neurologic complaints.  She denies any recent fevers or illnesses.  She denies any night sweats.  She has no chest pain, shortness of breath, cough, or hemoptysis.  She denies any nausea, vomiting, constipation, or diarrhea.  She has no urinary complaints.  Patient feels at her baseline offers no specific complaints today.  REVIEW OF SYSTEMS:   Review of Systems  Constitutional: Negative.  Negative for fever, malaise/fatigue and weight loss.  Respiratory: Negative.  Negative for cough, hemoptysis and shortness of breath.   Cardiovascular: Negative.  Negative for chest pain and leg swelling.  Gastrointestinal: Negative.  Negative for abdominal pain.  Genitourinary: Negative.  Negative for dysuria.  Musculoskeletal: Negative.  Negative for back pain.  Skin: Negative.  Negative for rash.  Neurological: Negative.  Negative for tingling, sensory change, focal weakness, weakness and headaches.  Psychiatric/Behavioral: Negative.  The patient is not nervous/anxious.    As per HPI. Otherwise, a complete review of systems is negative.  PAST MEDICAL HISTORY: Past Medical History:  Diagnosis Date   Dermoid tumor    hx of--left ovary   Gastric bypass status for obesity 2004   s/p roux-en-y   Generalized headaches    frequent   Heart murmur    History of cardiac murmur    Intraductal papilloma of breast, left 06/18/2017   05/2017 - s/p  excision 07/2017   Osteoporosis 03/2016   DEXA -2.6 spine   Salivary gland stone    x 2   Smoker     PAST SURGICAL HISTORY: Past Surgical History:  Procedure Laterality Date   ABDOMINAL HYSTERECTOMY     only ovary was removed   ANTERIOR CERVICAL DECOMP/DISCECTOMY FUSION  12/2016   [place]  ~1992   for dermoid ovarian cysts   BREAST BIOPSY Left 06/13/2017   papilloma with excision 07/2017   BREAST LUMPECTOMY WITH NEEDLE LOCALIZATION Left 08/10/2017   intraductal papilloma - Pabon, [place], MD   CHOLECYSTECTOMY  2000s   COLONOSCOPY WITH PROPOFOL N/A 11/27/2015   TA rpt 5 yrs Lucilla Lame, MD)   GASTRIC BYPASS  2004   roux en y [facility])   POLYPECTOMY  11/27/2015   Procedure: POLYPECTOMY;  Surgeon: Lucilla Lame, MD;  Location: [place];  Service: Endoscopy;;   REDUCTION MAMMAPLASTY Bilateral 62's   SALIVARY GLAND SURGERY     SALIVARY STONE REMOVAL      FAMILY HISTORY: Family History  Problem Relation Age of Onset   Cancer Father 29       leukemia   Hypertension Mother    Diabetes Neg Hx    Stroke Neg Hx    Coronary artery disease Neg Hx    Breast cancer Neg Hx     ADVANCED DIRECTIVES (Y/N):  N  HEALTH MAINTENANCE: Social History   Tobacco Use   Smoking status: Former    Packs/day: 0.25    Years: 20.00  Pack years: 5.00    Types: Cigarettes    Quit date: 10/31/2012    Years since quitting: 7.8   Smokeless tobacco: Never  Vaping Use   Vaping Use: Never used  Substance Use Topics   Alcohol use: Not Currently   Drug use: No     Colonoscopy:  PAP:  Bone density:  Lipid panel:  Allergies  Allergen Reactions   Boniva [Ibandronic Acid] Other (See Comments)    Diffuse body aches    Current Outpatient Medications  Medication Sig Dispense Refill   calcium carbonate (OS-CAL) 600 MG TABS tablet Take 600 mg by mouth daily with breakfast.     Cholecalciferol (VITAMIN D3) 1000 units CAPS Take 3 capsules  (3,000 Units total) by mouth daily.     denosumab (PROLIA) 60 MG/ML SOSY injection Inject 60 mg into the skin every 6 (six) months. 1 mL 1   ferrous sulfate 325 (65 FE) MG tablet Take 1 tablet (325 mg total) by mouth every Monday, Wednesday, and Friday.     Magnesium 250 MG TABS Take 1 tablet (250 mg total) by mouth daily.  0   methocarbamol (ROBAXIN) 500 MG tablet Take 1 tablet (500 mg total) by mouth 3 (three) times daily as needed for muscle spasms (sedation precautions). 40 tablet 1   vitamin B-12 (CYANOCOBALAMIN) 1000 MCG tablet Take 1 tablet (1,000 mcg total) by mouth daily.     zinc gluconate 50 MG tablet Take 1 tablet (50 mg total) by mouth daily.     prochlorperazine (COMPAZINE) 10 MG tablet Take 1 tablet (10 mg total) by mouth every 6 (six) hours as needed for nausea or vomiting. 60 tablet 0   sulfamethoxazole-trimethoprim (BACTRIM DS) 800-160 MG tablet Take 1 tablet by mouth 2 (two) times daily. (Patient not taking: Reported on 08/27/2020) 10 tablet 0   No current facility-administered medications for this visit.    OBJECTIVE: Vitals:   08/27/20 1025  BP: 117/88  Pulse: (!) 57  Resp: 16  Temp: (!) 97.1 F (36.2 C)     Body mass index is 27.04 kg/m.    ECOG FS:0 - Asymptomatic  General: Well-developed, well-nourished, no acute distress. Eyes: Pink conjunctiva, anicteric sclera. HEENT: Normocephalic, moist mucous membranes. Lungs: No audible wheezing or coughing. Heart: Regular rate and rhythm. Abdomen: Soft, nontender, no obvious distention. Musculoskeletal: No edema, cyanosis, or clubbing. Neuro: Alert, answering all questions appropriately. Cranial nerves grossly intact. Skin: No rashes or petechiae noted. Psych: Normal affect.   LAB RESULTS:  Lab Results  Component Value Date   NA 137 08/27/2020   K 4.8 08/27/2020   CL 100 08/27/2020   CO2 28 08/27/2020   GLUCOSE 87 08/27/2020   BUN 19 08/27/2020   CREATININE 0.42 (L) 08/27/2020   CALCIUM 9.7 08/27/2020    PROT 7.4 08/27/2020   ALBUMIN 4.3 08/27/2020   AST 41 08/27/2020   ALT 64 (H) 08/27/2020   ALKPHOS 54 08/27/2020   BILITOT 1.1 08/27/2020   GFRNONAA >60 08/27/2020   GFRAA >60 07/10/2013    Lab Results  Component Value Date   WBC 6.3 08/27/2020   NEUTROABS 3.7 08/27/2020   HGB 15.7 (H) 08/27/2020   HCT 47.7 (H) 08/27/2020   MCV 94.3 08/27/2020   PLT 193 08/27/2020     STUDIES: NM PET Image Restag (PS) Skull Base To Thigh  Result Date: 08/24/2020 CLINICAL DATA:  Subsequent treatment strategy for lymphoma. EXAM: NUCLEAR MEDICINE PET SKULL BASE TO THIGH TECHNIQUE: 8.6 mCi F-18 FDG was  injected intravenously. Full-ring PET imaging was performed from the skull base to thigh after the radiotracer. CT data was obtained and used for attenuation correction and anatomic localization. Fasting blood glucose: 86 mg/dl COMPARISON:  PET-CT April 14, 2020 FINDINGS: Mediastinal blood pool activity: SUV max 1.9 Liver activity: SUV max 2.63 NECK: No hypermetabolic cervical adenopathy. Hypermetabolic 1.8 cm nodule in the left lobe of the thyroid with max SUV of 5.88, unchanged in size with the previous max SUV of 7.12. This lesion was previously biopsied by fine-needle aspiration on May 13, 2020. Incidental CT findings: Right maxillary sinus mucous retention cyst. Dental hardware. CHEST: Interval resolution of the previously hypermetabolic scattered bilateral axillary lymph nodes, the previously indexed left axillary lymph node now measures 2-3 mm in short axis without significant residual abnormal hypermetabolic activity previously measuring 10 mm with a max SUV of 4.6. No pathologically enlarged or abnormally hypermetabolic thoracic lymph nodes. Incidental CT findings: Hypoventilatory change in the lung bases. No thoracic aortic aneurysm. Normal size heart. No significant pericardial effusion/thickening. ABDOMEN/PELVIS: Interval decrease in size in the retroperitoneal/retrocaval mass which now measures 2.7 x  1.9 cm on image 149/3 with a max SUV of 3.4 previously measuring 4.8 x 3.0 cm with a max SUV of 8.88. The left periaortic lymph node previously indexed is now without significant abnormal hypermetabolic activity and measures 5 mm on image 147/5 previously measuring 10 mm with the max SUV of 4.33. The previously enlarged hypermetabolic mesenteric lymph nodes are now decreased in size and without significant abnormal hypermetabolic activity with the indexed node now measuring 6 mm in short axis on image 165/3 with a max SUV of 1.35 previously 16 mm measuring with a max SUV of 9.62. No new pathologically enlarged or abnormally hypermetabolic abdominal lymph nodes. No pelvic or inguinal lymphadenopathy. Incidental CT findings: Surgical changes of prior gastric bypass. Gallbladder is surgically absent. Sequela of prior injections or trauma in the posterior left gluteal subcutaneous soft tissues. SKELETON: No focal hypermetabolic activity to suggest skeletal metastasis. Incidental CT findings: none IMPRESSION: 1. Findings consistent with treatment response. Decreased in size and abnormal FDG avidity of the right retroperitoneal mass with metabolic activity now slightly greater than that of background liver. ([place] 4) 2. Interval decrease in size and metabolic activity of the now prominent and minimally metabolic mesenteric lymph nodes with activity less than that mediastinal blood pool. 3. Significant decrease in size of the now prominent axillary lymph nodes which are now without significant metabolic activity. 4. No evidence of new or progressive disease in the chest, abdomen or pelvis. 5. Unchanged size with slightly decreased FDG avidity in the 1.8 cm left thyroid nodule which underwent tissue sampling in the interval. Electronically Signed   By: Dahlia Bailiff MD   On: 08/24/2020 14:50     ASSESSMENT: At least stage III follicular lymphoma, grade 1/2.    PLAN:    1.  At least stage III follicular lymphoma,  grade 1/2: Retroperitoneal lymph node biopsy on April 21, 2020 confirmed the diagnosis. PET scan on April 14, 2020 revealed a hypermetabolic right sided retroperitoneal nodal mass along with hypermetabolic mesenteric adenopathy.  Patient also noted to have bilateral axillary adenopathy.  Although patient was asymptomatic and active surveillance was offered, she elected to pursue treatment and received IV Rituxan weekly x4 completing on May 28, 2020.  Repeat PET scan from August 24, 2020 reviewed independently and reported as above with significant treatment response, although likely residual disease.  Patient has declined maintenance Rituxan.  She does not require additional treatment at this time but did acknowledge that if she has progressive disease she may have to reinitiate Rituxan.  No intervention is needed at this time.  Return to clinic in 3 months with repeat CT scan and further evaluation.   2.  Hypermetabolic thyroid nodule: Previously, biopsy was negative for malignancy. 3.  Elevated liver enzymes: Chronic and unchanged.  Patient's ALT is 64 today. 4.  Polycythemia: Hemoglobin has increased back to 15.7, monitor. 5.  Hyperkalemia: Resolved.    Patient expressed understanding and was in agreement with this plan. She also understands that She can call clinic at any time with any questions, concerns, or complaints.   Cancer Staging Follicular lymphoma ([place]) Staging form: Hodgkin and Non-Hodgkin Lymphoma, AJCC 8th Edition - Clinical stage from 123XX123: Stage III (Follicular lymphoma) - Signed by [name], MD on 04/29/2020 Stage prefix: Initial diagnosis   [name], MD   08/27/2020 12:55 PM

## 2020-08-27 ENCOUNTER — Encounter: Payer: Self-pay | Admitting: Oncology

## 2020-08-27 ENCOUNTER — Inpatient Hospital Stay: Payer: BC Managed Care – PPO | Attending: Oncology

## 2020-08-27 ENCOUNTER — Inpatient Hospital Stay (HOSPITAL_BASED_OUTPATIENT_CLINIC_OR_DEPARTMENT_OTHER): Payer: BC Managed Care – PPO | Admitting: Oncology

## 2020-08-27 VITALS — BP 117/88 | HR 57 | Temp 97.1°F | Resp 16 | Wt 162.5 lb

## 2020-08-27 DIAGNOSIS — C8200 Follicular lymphoma grade I, unspecified site: Secondary | ICD-10-CM | POA: Insufficient documentation

## 2020-08-27 DIAGNOSIS — Z87891 Personal history of nicotine dependence: Secondary | ICD-10-CM | POA: Insufficient documentation

## 2020-08-27 DIAGNOSIS — D751 Secondary polycythemia: Secondary | ICD-10-CM | POA: Insufficient documentation

## 2020-08-27 DIAGNOSIS — C8218 Follicular lymphoma grade II, lymph nodes of multiple sites: Secondary | ICD-10-CM

## 2020-08-27 LAB — CBC WITH DIFFERENTIAL/PLATELET
Abs Immature Granulocytes: 0.01 10*3/uL (ref 0.00–0.07)
Basophils Absolute: 0 10*3/uL (ref 0.0–0.1)
Basophils Relative: 1 %
Eosinophils Absolute: 0.1 10*3/uL (ref 0.0–0.5)
Eosinophils Relative: 1 %
HCT: 47.7 % — ABNORMAL HIGH (ref 36.0–46.0)
Hemoglobin: 15.7 g/dL — ABNORMAL HIGH (ref 12.0–15.0)
Immature Granulocytes: 0 %
Lymphocytes Relative: 29 %
Lymphs Abs: 1.8 10*3/uL (ref 0.7–4.0)
MCH: 31 pg (ref 26.0–34.0)
MCHC: 32.9 g/dL (ref 30.0–36.0)
MCV: 94.3 fL (ref 80.0–100.0)
Monocytes Absolute: 0.6 10*3/uL (ref 0.1–1.0)
Monocytes Relative: 9 %
Neutro Abs: 3.7 10*3/uL (ref 1.7–7.7)
Neutrophils Relative %: 60 %
Platelets: 193 10*3/uL (ref 150–400)
RBC: 5.06 MIL/uL (ref 3.87–5.11)
RDW: 13.5 % (ref 11.5–15.5)
WBC: 6.3 10*3/uL (ref 4.0–10.5)
nRBC: 0 % (ref 0.0–0.2)

## 2020-08-27 LAB — COMPREHENSIVE METABOLIC PANEL
ALT: 64 U/L — ABNORMAL HIGH (ref 0–44)
AST: 41 U/L (ref 15–41)
Albumin: 4.3 g/dL (ref 3.5–5.0)
Alkaline Phosphatase: 54 U/L (ref 38–126)
Anion gap: 9 (ref 5–15)
BUN: 19 mg/dL (ref 6–20)
CO2: 28 mmol/L (ref 22–32)
Calcium: 9.7 mg/dL (ref 8.9–10.3)
Chloride: 100 mmol/L (ref 98–111)
Creatinine, Ser: 0.42 mg/dL — ABNORMAL LOW (ref 0.44–1.00)
GFR, Estimated: >60 mL/min (ref >60)
Glucose, Bld: 87 mg/dL (ref 70–99)
Potassium: 4.8 mmol/L (ref 3.5–5.1)
Sodium: 137 mmol/L (ref 135–145)
Total Bilirubin: 1.1 mg/dL (ref 0.3–1.2)
Total Protein: 7.4 g/dL (ref 6.5–8.1)

## 2020-08-27 NOTE — Progress Notes (Signed)
Patient denies new problems/concerns today.   °

## 2020-09-05 ENCOUNTER — Telehealth: Payer: Self-pay

## 2020-09-05 NOTE — Telephone Encounter (Signed)
Prolia VOB due

## 2020-09-05 NOTE — Telephone Encounter (Signed)
this one i have a note stating PA is good until 03/31/21 and pt has a copay card that she uses, RX to go to pharmacy

## 2020-09-09 NOTE — Telephone Encounter (Signed)
Prolia VOB initiated via [email or web address]  Last OV: 05/18/20 Next OV:  Last Prolia inj: 04/10/20 Next Prolia inj DUE: 10/12/20

## 2020-09-18 NOTE — Telephone Encounter (Signed)
Pt ready for scheduling on or after 10/12/20  Out-of-pocket cost due at time of visit: $0.00 * Prolia and administration are subject to $1,250.00 deductible ($1,250.00 met), 20% coinsurance, and $3,700.00 out of pocket max ($3,700.00 met). Once met, Prolia will then be covered at 100%. The deductible is included in the out of pocket max. *  Primary: [employer] Prolia co-insurance: 20% (until deductible and OOP max are met) Admin fee co-insurance: 0% (until deductible and OOP max are met)  Secondary: n/a Prolia co-insurance:  Admin fee co-insurance:   Deductible: $1,250.00 of $1,250.00 met OOP: $3,700.00 of $3,700.00 met  Prior Auth: APPROVED via [employer] PA# [id number] Valid: 03/31/20-03/31/21

## 2020-09-22 ENCOUNTER — Other Ambulatory Visit: Payer: Self-pay | Admitting: Family Medicine

## 2020-09-22 DIAGNOSIS — M810 Age-related osteoporosis without current pathological fracture: Secondary | ICD-10-CM

## 2020-09-22 NOTE — Telephone Encounter (Signed)
Spoke with patient. She will come by tomorrow with the paper she has, that should be the copay card information, and I will make sure the mail order pharmacy has this information on file and then will send RX.   Lab was done on 08/27/20 no need to repeat. 165.73 mL/min. Calcium was normal at 9.7  Nurse visit on 10/14/20

## 2020-09-24 MED ORDER — PROLIA 60 MG/ML ~~LOC~~ SOSY
PREFILLED_SYRINGE | SUBCUTANEOUS | 0 refills | Status: DC
Start: 2020-09-24 — End: 2021-03-24

## 2020-09-24 NOTE — Telephone Encounter (Signed)
Will forward to Anastasiya - do I need to order prolia to the pharmacy?

## 2020-09-24 NOTE — Telephone Encounter (Signed)
I am taking care of this. Thank you. Patient is aware Prolia will be mailed to her home and she will bring it with her on the day of her injection.  Faxed manually

## 2020-09-28 ENCOUNTER — Telehealth: Payer: Self-pay | Admitting: Family Medicine

## 2020-09-28 NOTE — Telephone Encounter (Signed)
Melinda Hall called in from [facility] and stated that they are putting in a  shipment for 9/7 we should have it by 9/8

## 2020-09-28 NOTE — Telephone Encounter (Signed)
Noted! Thank you

## 2020-10-08 ENCOUNTER — Telehealth: Payer: Self-pay | Admitting: Family Medicine

## 2020-10-14 ENCOUNTER — Other Ambulatory Visit: Payer: Self-pay

## 2020-10-14 ENCOUNTER — Ambulatory Visit (INDEPENDENT_AMBULATORY_CARE_PROVIDER_SITE_OTHER): Payer: BC Managed Care – PPO

## 2020-10-14 DIAGNOSIS — M810 Age-related osteoporosis without current pathological fracture: Secondary | ICD-10-CM | POA: Diagnosis not present

## 2020-10-14 MED ORDER — DENOSUMAB 60 MG/ML ~~LOC~~ SOSY
60.0000 mg | PREFILLED_SYRINGE | Freq: Once | SUBCUTANEOUS | Status: AC
  Administered 2020-10-14: 60 mg via SUBCUTANEOUS

## 2020-10-14 NOTE — Progress Notes (Signed)
Per orders of Dr. Danise Mina, injection of Prolia given by Pilar Grammes, CMA in right arm. Patient tolerated injection well.  Sticker for calendar given to pt as a reminder for the next time she is due in March 2023.

## 2020-11-25 ENCOUNTER — Telehealth: Payer: Self-pay | Admitting: Family Medicine

## 2020-11-25 NOTE — Telephone Encounter (Signed)
error 

## 2020-11-26 NOTE — Progress Notes (Signed)
[place]  Telephone:(336) [phone] Fax:(336) [phone]  ID: Melinda Hall OB: 1960-05-02  MR#: [id number]  OEV#:[id number]  Patient Care Team: [name], MD as PCP - General (Family Medicine)  CHIEF COMPLAINT: At least stage III follicular lymphoma, grade 1/2.    INTERVAL HISTORY: Patient returns to clinic today for further evaluation and discussion of her imaging results.  She continues to feel well and remains asymptomatic.  She denies any weakness or fatigue.  She has a good appetite and denies weight loss.  She has no neurologic complaints.  She denies any recent fevers or illnesses.  She denies any night sweats.  She has no chest pain, shortness of breath, cough, or hemoptysis.  She denies any nausea, vomiting, constipation, or diarrhea.  She has no urinary complaints.  Patient feels at her baseline offers no specific complaints today.  REVIEW OF SYSTEMS:   Review of Systems  Constitutional: Negative.  Negative for fever, malaise/fatigue and weight loss.  Respiratory: Negative.  Negative for cough, hemoptysis and shortness of breath.   Cardiovascular: Negative.  Negative for chest pain and leg swelling.  Gastrointestinal: Negative.  Negative for abdominal pain.  Genitourinary: Negative.  Negative for dysuria.  Musculoskeletal: Negative.  Negative for back pain.  Skin: Negative.  Negative for rash.  Neurological: Negative.  Negative for tingling, sensory change, focal weakness, weakness and headaches.  Psychiatric/Behavioral: Negative.  The patient is not nervous/anxious.    As per HPI. Otherwise, a complete review of systems is negative.  PAST MEDICAL HISTORY: Past Medical History:  Diagnosis Date   Cancer ([place])    Dermoid tumor    hx of--left ovary   Gastric bypass status for obesity 2004   s/p roux-en-y   Generalized headaches    frequent   Heart murmur    History of cardiac murmur    Intraductal papilloma of breast, left 06/18/2017   05/2017  - s/p excision 07/2017   Osteoporosis 03/2016   DEXA -2.6 spine   Salivary gland stone    x 2   Smoker     PAST SURGICAL HISTORY: Past Surgical History:  Procedure Laterality Date   ABDOMINAL HYSTERECTOMY     only ovary was removed   ANTERIOR CERVICAL DECOMP/DISCECTOMY FUSION  12/2016   [place]  ~1992   for dermoid ovarian cysts   BREAST BIOPSY Left 06/13/2017   papilloma with excision 07/2017   BREAST LUMPECTOMY WITH NEEDLE LOCALIZATION Left 08/10/2017   intraductal papilloma - Pabon, [place], MD   CHOLECYSTECTOMY  2000s   COLONOSCOPY WITH PROPOFOL N/A 11/27/2015   TA rpt 5 yrs Lucilla Lame, MD)   GASTRIC BYPASS  2004   roux en y [facility])   POLYPECTOMY  11/27/2015   Procedure: POLYPECTOMY;  Surgeon: Lucilla Lame, MD;  Location: [place];  Service: Endoscopy;;   REDUCTION MAMMAPLASTY Bilateral 9's   SALIVARY GLAND SURGERY     SALIVARY STONE REMOVAL      FAMILY HISTORY: Family History  Problem Relation Age of Onset   Cancer Father 21       leukemia   Hypertension Mother    Diabetes Neg Hx    Stroke Neg Hx    Coronary artery disease Neg Hx    Breast cancer Neg Hx     ADVANCED DIRECTIVES (Y/N):  N  HEALTH MAINTENANCE: Social History   Tobacco Use   Smoking status: Former    Packs/day: 0.25    Years:  20.00    Pack years: 5.00    Types: Cigarettes    Quit date: 10/31/2012    Years since quitting: 8.0   Smokeless tobacco: Never  Vaping Use   Vaping Use: Never used  Substance Use Topics   Alcohol use: Not Currently   Drug use: No     Colonoscopy:  PAP:  Bone density:  Lipid panel:  Allergies  Allergen Reactions   Boniva [Ibandronic Acid] Other (See Comments)    Diffuse body aches    Current Outpatient Medications  Medication Sig Dispense Refill   calcium carbonate (OS-CAL) 600 MG TABS tablet Take 600 mg by mouth daily with breakfast.     Cholecalciferol (VITAMIN D3) 1000 units CAPS Take 3  capsules (3,000 Units total) by mouth daily.     denosumab (PROLIA) 60 MG/ML SOSY injection INJECT 60MG  UNDER THE SKIN EVERY 6 MONTHS 1 mL 0   ferrous sulfate 325 (65 FE) MG tablet Take 1 tablet (325 mg total) by mouth every Monday, Wednesday, and Friday.     Magnesium 250 MG TABS Take 1 tablet (250 mg total) by mouth daily.  0   methocarbamol (ROBAXIN) 500 MG tablet Take 1 tablet (500 mg total) by mouth 3 (three) times daily as needed for muscle spasms (sedation precautions). 40 tablet 1   vitamin B-12 (CYANOCOBALAMIN) 1000 MCG tablet Take 1 tablet (1,000 mcg total) by mouth daily.     zinc gluconate 50 MG tablet Take 1 tablet (50 mg total) by mouth daily.     prochlorperazine (COMPAZINE) 10 MG tablet Take 1 tablet (10 mg total) by mouth every 6 (six) hours as needed for nausea or vomiting. 60 tablet 0   sulfamethoxazole-trimethoprim (BACTRIM DS) 800-160 MG tablet Take 1 tablet by mouth 2 (two) times daily. (Patient not taking: No sig reported) 10 tablet 0   No current facility-administered medications for this visit.    OBJECTIVE: Vitals:   12/02/20 1054  BP: (!) 140/92  Pulse: (!) 58  Resp: 16  Temp: (!) 97 F (36.1 C)     Body mass index is 27.14 kg/m.    ECOG FS:0 - Asymptomatic  General: Well-developed, well-nourished, no acute distress. Eyes: Pink conjunctiva, anicteric sclera. HEENT: Normocephalic, moist mucous membranes. Lungs: No audible wheezing or coughing. Heart: Regular rate and rhythm. Abdomen: Soft, nontender, no obvious distention. Musculoskeletal: No edema, cyanosis, or clubbing. Neuro: Alert, answering all questions appropriately. Cranial nerves grossly intact. Skin: No rashes or petechiae noted. Psych: Normal affect.  LAB RESULTS:  Lab Results  Component Value Date   NA 138 12/02/2020   K 5.1 12/02/2020   CL 103 12/02/2020   CO2 29 12/02/2020   GLUCOSE 89 12/02/2020   BUN 17 12/02/2020   CREATININE 0.54 12/02/2020   CALCIUM 9.0 12/02/2020   PROT 7.1  12/02/2020   ALBUMIN 4.3 12/02/2020   AST 25 12/02/2020   ALT 37 12/02/2020   ALKPHOS 44 12/02/2020   BILITOT 1.1 12/02/2020   GFRNONAA >60 12/02/2020   GFRAA >60 07/10/2013    Lab Results  Component Value Date   WBC 3.3 (L) 12/02/2020   NEUTROABS 1.0 (L) 12/02/2020   HGB 15.0 12/02/2020   HCT 46.1 (H) 12/02/2020   MCV 94.3 12/02/2020   PLT 165 12/02/2020     STUDIES: CT Abdomen Pelvis W Contrast  Result Date: 11/29/2020 CLINICAL DATA:  Lymphoma restaging, assess treatment response, ongoing immunotherapy EXAM: CT ABDOMEN AND PELVIS WITH CONTRAST TECHNIQUE: Multidetector CT imaging of the abdomen and  pelvis was performed using the standard protocol following bolus administration of intravenous contrast. CONTRAST:  122mL OMNIPAQUE IOHEXOL 300 MG/ML SOLN, additional oral enteric contrast COMPARISON:  PET-CT, 08/24/2020, 04/14/2020 FINDINGS: Lower chest: No acute abnormality. Hepatobiliary: No focal liver abnormality is seen. Status post cholecystectomy. No biliary dilatation. Pancreas: Unremarkable. No pancreatic ductal dilatation or surrounding inflammatory changes. Spleen: Normal in size without significant abnormality. Adrenals/Urinary Tract: Adrenal glands are unremarkable. Kidneys are normal, without renal calculi, solid lesion, or hydronephrosis. Bladder is unremarkable. Stomach/Bowel: Status post Roux type gastric bypass. Appendix is not clearly visualized. No evidence of bowel wall thickening, distention, or inflammatory changes. Vascular/Lymphatic: No significant vascular findings are present. No enlarged abdominal or pelvic lymph nodes. In particular, there are no persistently enlarged mesenteric lymph nodes. Reproductive: No mass or other significant abnormality. Other: No abdominal wall hernia or abnormality. No abdominopelvic ascites. Unchanged soft tissue mass of the right retroperitoneum underlying the diaphragmatic crus and inferior vena cava, measuring 2.6 x 1.8 cm (series 2,  image 25). Musculoskeletal: No acute or significant osseous findings. Incidental, benign, trabeculated vertebral body hemangioma of the right aspect of L3 (series 4, image 87). IMPRESSION: 1. Unchanged soft tissue mass of the right retroperitoneum underlying the diaphragmatic crus and inferior vena cava, measuring 2.6 x 1.8 cm and previously with low level residual FDG avidity. 2. No persistently enlarged mesenteric lymph nodes. No evidence of new lymphadenopathy in the abdomen or pelvis. 3. Status post Roux type gastric bypass and cholecystectomy. Electronically Signed   By: Delanna Ahmadi M.D.   On: 11/29/2020 14:29     ASSESSMENT: At least stage III follicular lymphoma, grade 1/2.    PLAN:    1.  At least stage III follicular lymphoma, grade 1/2: Retroperitoneal lymph node biopsy on April 21, 2020 confirmed the diagnosis. PET scan on April 14, 2020 revealed a hypermetabolic right sided retroperitoneal nodal mass along with hypermetabolic mesenteric adenopathy.  Patient also noted to have bilateral axillary adenopathy.  Although patient was asymptomatic and active surveillance was offered, she elected to pursue treatment and received IV Rituxan weekly x4 completing on May 28, 2020.  Repeat PET scan from August 24, 2020 reviewed independently with significant treatment response, although likely residual disease.  Patient previously declined maintenance Rituxan.  Repeat imaging with CT scan on November 27, 2020 reviewed independently and reported as above with no evidence of recurrent or progressive disease.  No intervention is needed at this time.  Patient can now be transition to imaging and evaluation every 6 months for the next 2 years at which point patient can then be evaluated yearly.  Return to clinic in 6 months with repeat CT scan, laboratory work, and further evaluation.  Patient expressed understanding that if she did have recurrent or progressive disease we could reinitiate Rituxan.   2.   Hypermetabolic thyroid nodule: Previously, biopsy was negative for malignancy. 3.  Elevated liver enzymes: Resolved. 4.  Polycythemia: Resolved.   5.  Leukopenia: Mild, monitor.    Patient expressed understanding and was in agreement with this plan. She also understands that She can call clinic at any time with any questions, concerns, or complaints.   Cancer Staging Follicular lymphoma ([place]) Staging form: Hodgkin and Non-Hodgkin Lymphoma, AJCC 8th Edition - Clinical stage from 3/61/4431: Stage III (Follicular lymphoma) - Signed by [name], MD on 04/29/2020 Stage prefix: Initial diagnosis   [name], MD   12/03/2020 10:26 AM

## 2020-11-27 ENCOUNTER — Ambulatory Visit
Admission: RE | Admit: 2020-11-27 | Discharge: 2020-11-27 | Disposition: A | Discharge status: Home / Self Care | Payer: BC Managed Care – PPO | Source: Ambulatory Visit | Attending: Oncology | Admitting: Oncology

## 2020-11-27 ENCOUNTER — Other Ambulatory Visit: Payer: Self-pay

## 2020-11-27 DIAGNOSIS — C8590 Non-Hodgkin lymphoma, unspecified, unspecified site: Secondary | ICD-10-CM | POA: Diagnosis not present

## 2020-11-27 DIAGNOSIS — C8218 Follicular lymphoma grade II, lymph nodes of multiple sites: Secondary | ICD-10-CM | POA: Insufficient documentation

## 2020-11-27 DIAGNOSIS — R1909 Other intra-abdominal and pelvic swelling, mass and lump: Secondary | ICD-10-CM | POA: Diagnosis not present

## 2020-11-27 DIAGNOSIS — Z9049 Acquired absence of other specified parts of digestive tract: Secondary | ICD-10-CM | POA: Diagnosis not present

## 2020-11-27 DIAGNOSIS — D1800 Hemangioma unspecified site: Secondary | ICD-10-CM | POA: Diagnosis not present

## 2020-11-27 HISTORY — DX: Malignant (primary) neoplasm, unspecified: C80.1

## 2020-11-27 LAB — POCT I-STAT CREATININE: Creatinine, Ser: 0.5 mg/dL (ref 0.44–1.00)

## 2020-11-27 MED ORDER — IOHEXOL 300 MG/ML  SOLN
100.0000 mL | Freq: Once | INTRAMUSCULAR | Status: AC | PRN
  Administered 2020-11-27: 100 mL via INTRAVENOUS

## 2020-12-02 ENCOUNTER — Encounter: Payer: Self-pay | Admitting: Family Medicine

## 2020-12-02 ENCOUNTER — Inpatient Hospital Stay (HOSPITAL_BASED_OUTPATIENT_CLINIC_OR_DEPARTMENT_OTHER): Payer: BC Managed Care – PPO | Admitting: Oncology

## 2020-12-02 ENCOUNTER — Inpatient Hospital Stay: Payer: BC Managed Care – PPO | Attending: Oncology

## 2020-12-02 ENCOUNTER — Encounter: Payer: Self-pay | Admitting: Oncology

## 2020-12-02 ENCOUNTER — Other Ambulatory Visit: Payer: Self-pay

## 2020-12-02 VITALS — BP 140/92 | HR 58 | Temp 97.0°F | Resp 16 | Wt 163.1 lb

## 2020-12-02 DIAGNOSIS — Z87891 Personal history of nicotine dependence: Secondary | ICD-10-CM | POA: Insufficient documentation

## 2020-12-02 DIAGNOSIS — C8200 Follicular lymphoma grade I, unspecified site: Secondary | ICD-10-CM | POA: Insufficient documentation

## 2020-12-02 DIAGNOSIS — C8218 Follicular lymphoma grade II, lymph nodes of multiple sites: Secondary | ICD-10-CM

## 2020-12-02 LAB — COMPREHENSIVE METABOLIC PANEL WITH GFR
ALT: 37 U/L (ref 0–44)
AST: 25 U/L (ref 15–41)
Albumin: 4.3 g/dL (ref 3.5–5.0)
Alkaline Phosphatase: 44 U/L (ref 38–126)
Anion gap: 6 (ref 5–15)
BUN: 17 mg/dL (ref 8–23)
CO2: 29 mmol/L (ref 22–32)
Calcium: 9 mg/dL (ref 8.9–10.3)
Chloride: 103 mmol/L (ref 98–111)
Creatinine, Ser: 0.54 mg/dL (ref 0.44–1.00)
GFR, Estimated: >60 mL/min
Glucose, Bld: 89 mg/dL (ref 70–99)
Potassium: 5.1 mmol/L (ref 3.5–5.1)
Sodium: 138 mmol/L (ref 135–145)
Total Bilirubin: 1.1 mg/dL (ref 0.3–1.2)
Total Protein: 7.1 g/dL (ref 6.5–8.1)

## 2020-12-02 LAB — CBC WITH DIFFERENTIAL/PLATELET
Abs Immature Granulocytes: 0 10*3/uL (ref 0.00–0.07)
Band Neutrophils: 1 %
Basophils Absolute: 0 10*3/uL (ref 0.0–0.1)
Basophils Relative: 0 %
Blasts: 0 %
Eosinophils Absolute: 0 10*3/uL (ref 0.0–0.5)
Eosinophils Relative: 1 %
HCT: 46.1 % — ABNORMAL HIGH (ref 36.0–46.0)
Hemoglobin: 15 g/dL (ref 12.0–15.0)
Lymphocytes Relative: 51 %
Lymphs Abs: 1.7 10*3/uL (ref 0.7–4.0)
MCH: 30.7 pg (ref 26.0–34.0)
MCHC: 32.5 g/dL (ref 30.0–36.0)
MCV: 94.3 fL (ref 80.0–100.0)
Metamyelocytes Relative: 0 %
Monocytes Absolute: 0.6 10*3/uL (ref 0.1–1.0)
Monocytes Relative: 19 %
Myelocytes: 0 %
Neutro Abs: 1 10*3/uL — ABNORMAL LOW (ref 1.7–7.7)
Neutrophils Relative %: 28 %
Other: 0 %
Platelets: 165 10*3/uL (ref 150–400)
Promyelocytes Relative: 0 %
RBC: 4.89 MIL/uL (ref 3.87–5.11)
RDW: 13.3 % (ref 11.5–15.5)
WBC: 3.3 10*3/uL — ABNORMAL LOW (ref 4.0–10.5)
nRBC: 0 % (ref 0.0–0.2)
nRBC: 0 /100 WBC

## 2020-12-02 NOTE — Progress Notes (Signed)
For the past 3 weeks she has felt exhausted which is abnormal.

## 2020-12-03 ENCOUNTER — Encounter: Payer: Self-pay | Admitting: Oncology

## 2020-12-15 DIAGNOSIS — M5489 Other dorsalgia: Secondary | ICD-10-CM | POA: Diagnosis not present

## 2020-12-15 DIAGNOSIS — R200 Anesthesia of skin: Secondary | ICD-10-CM | POA: Diagnosis not present

## 2020-12-21 ENCOUNTER — Encounter: Payer: Self-pay | Admitting: Oncology

## 2021-01-06 ENCOUNTER — Telehealth: Payer: Self-pay | Admitting: *Deleted

## 2021-01-06 NOTE — Telephone Encounter (Signed)
Completed and faxed critical illness policy to [employer]. Patient has a policy through spouse- Elyse Hsu (coverage leave # [id number])

## 2021-02-24 ENCOUNTER — Encounter: Payer: Self-pay | Admitting: Family Medicine

## 2021-02-24 ENCOUNTER — Telehealth: Payer: Self-pay

## 2021-02-24 ENCOUNTER — Other Ambulatory Visit: Payer: Self-pay

## 2021-02-24 ENCOUNTER — Ambulatory Visit (INDEPENDENT_AMBULATORY_CARE_PROVIDER_SITE_OTHER): Payer: BC Managed Care – PPO | Admitting: Family Medicine

## 2021-02-24 VITALS — BP 130/82 | HR 63 | Temp 97.6°F | Ht 62.75 in | Wt 162.1 lb

## 2021-02-24 DIAGNOSIS — Z23 Encounter for immunization: Secondary | ICD-10-CM | POA: Diagnosis not present

## 2021-02-24 DIAGNOSIS — Z0001 Encounter for general adult medical examination with abnormal findings: Secondary | ICD-10-CM | POA: Diagnosis not present

## 2021-02-24 DIAGNOSIS — C8218 Follicular lymphoma grade II, lymph nodes of multiple sites: Secondary | ICD-10-CM

## 2021-02-24 DIAGNOSIS — E611 Iron deficiency: Secondary | ICD-10-CM

## 2021-02-24 DIAGNOSIS — Z1211 Encounter for screening for malignant neoplasm of colon: Secondary | ICD-10-CM | POA: Diagnosis not present

## 2021-02-24 DIAGNOSIS — H5702 Anisocoria: Secondary | ICD-10-CM

## 2021-02-24 DIAGNOSIS — R0789 Other chest pain: Secondary | ICD-10-CM

## 2021-02-24 DIAGNOSIS — E663 Overweight: Secondary | ICD-10-CM

## 2021-02-24 DIAGNOSIS — M810 Age-related osteoporosis without current pathological fracture: Secondary | ICD-10-CM | POA: Diagnosis not present

## 2021-02-24 DIAGNOSIS — F4323 Adjustment disorder with mixed anxiety and depressed mood: Secondary | ICD-10-CM

## 2021-02-24 DIAGNOSIS — E042 Nontoxic multinodular goiter: Secondary | ICD-10-CM

## 2021-02-24 DIAGNOSIS — Z9884 Bariatric surgery status: Secondary | ICD-10-CM

## 2021-02-24 NOTE — Telephone Encounter (Signed)
CALLED PATIENT NO ANSWER LEFT VOICEMAIL FOR A CALL BACK with interp

## 2021-02-24 NOTE — Patient Instructions (Addendum)
Flu shot today  Labs today  EKG today  Llame a Dr Grayland Ormond para hacer cita para evaluar estos sintomas de sudor y Programmer, applications. [phone]  Laboratorios hoy.  La remitiremos de nuevo a Dr Allen Norris para colonoscopia.  Regresar en 4-6 meses para [place] Maintenance for Postmenopausal Women La menopausia es un proceso normal en el cual la capacidad de quedar embarazada llega a su fin. Este proceso ocurre lentamente a lo largo de un perodo de muchos meses o aos; por lo general, entre los 10 y los 40 aos. La menopausia es completa cuando no se ha tenido el perodo menstrual por 12 meses. Es importante hablar con el mdico sobre algunas de las enfermedades ms comunes que afectan a las mujeres despus de la menopausia (mujeres posmenopusicas). Estas incluyen la enfermedad cardaca, el cncer y la prdida sea (osteoporosis). Adoptar un estilo de vida saludable y recibir atencin preventiva pueden ayudar a promover la salud y Musician. Las medidas que tome tambin pueden reducir las probabilidades de Actor algunas de estas enfermedades frecuentes. Cules son los signos y sntomas de la menopausia? Durante la menopausia, puede tener los siguientes sntomas: Nurse, learning disability. Estos pueden ser moderados o intensos. Sudoracin nocturna. Disminucin del deseo sexual. Cambios en el estado de nimo. Dolores de [place]. Cansancio (fatiga). Irritabilidad. Problemas de memoria. Problemas para quedarse dormida o para seguir durmiendo. Hable con el mdico sobre las opciones de tratamiento para sus sntomas. Necesito terapia de reemplazo hormonal? La terapia de reemplazo hormonal es eficaz para tratar los sntomas causados por la menopausia, como los acaloramientos y las sudoraciones nocturnas. La reposicin hormonal conlleva ciertos riesgos, especialmente a medida que una mujer envejece. Si est pensando en usar estrgeno o  estrgeno con progestina, analice los beneficios y los riesgos con el mdico. Cmo puedo reducir el riesgo de tener enfermedad cardaca y accidente cerebrovascular? A medida que se envejece, aumenta el riesgo de enfermedad cardaca, infarto de miocardio y accidente cerebrovascular. Una de las causas puede ser un cambio en las hormonas del cuerpo durante la menopausia. Esto puede afectar la forma en que el organismo procesa las [place], los triglicridos y el colesterol de su dieta. El infarto de miocardio y el accidente cerebrovascular son emergencias mdicas. Hay muchas cosas que se pueden hacer para ayudar a prevenir la enfermedad cardaca y el accidente cerebrovascular. Contrlese la presin arterial La hipertensin arterial causa enfermedades cardacas y [place] el riesgo de accidente cerebrovascular. Es ms probable que esto se manifieste en las personas que tienen lecturas de presin arterial alta o tienen sobrepeso. Hgase controlar la presin arterial: Cada 3 a 5 aos si tiene entre 18 y 63 aos. Todos los aos si es mayor de 40 aos. Consuma una dieta saludable  Consuma una dieta que incluya muchas verduras, frutas, productos lcteos con bajo contenido de [place] y Advertising account planner. No consuma muchos alimentos ricos en grasas slidas, azcares agregados o sodio. Haga ejercicio con regularidad Haga ejercicio con regularidad. Esta es una de las prcticas ms importantes que puede hacer por su salud. La mayora de los adultos deben seguir estas pautas: Intente realizar al menos 150 minutos de actividad fsica por semana. El ejercicio debe aumentar la frecuencia cardaca y Nature conservation officer transpirar (ejercicio de intensidad moderada). Intente hacer ejercicios de elongacin por lo menos dos veces por semana. Agrguelos al plan de ejercicio de intensidad moderada. Pase menos tiempo sentada. Incluso la actividad fsica ligera puede ser beneficiosa. Otros consejos Trabaje con  su mdico para Science writer o  Statistician peso saludable. No consuma ningn producto que contenga nicotina o tabaco. Estos productos incluyen cigarrillos, tabaco para Higher education careers adviser y aparatos de vapeo, como los Psychologist, sport and exercise. Si necesita ayuda para dejar de consumir estos productos, consulte al mdico. Conozca sus cifras. Pdale al mdico que le controle el colesterol y el nivel sanguneo de azcar en la sangre (glucosa). Siga hacindose anlisis de [employer] se lo haya indicado el mdico. Necesito realizarme pruebas de deteccin del cncer? Segn sus antecedentes mdicos y familiares, es posible que deba realizarse pruebas de deteccin del cncer en diferentes etapas de la vida. Esto puede incluir pruebas de deteccin de lo siguiente: Cncer de mama. Cncer de cuello uterino. Cncer de pulmn. Cncer colorrectal. Cul es mi riesgo de tener osteoporosis? Despus de la menopausia, puede correr un riesgo ms alto de tener osteoporosis. La osteoporosis es una afeccin en la cual la destruccin de la masa sea ocurre con mayor rapidez que su formacin. Para ayudar a prevenir esta afeccin o las fracturas seas que pueden ocurrir a causa de [place], usted puede tomar las siguientes medidas: Si tiene entre 73 y 49 aos, reciba como mnimo 1000 mg de calcio y 600 unidades internacionales (UI) de vitamina D por Training and development officer. Si tiene ms de 50 aos pero menos de 70 aos, reciba como mnimo 1200 mg de calcio y al menos 600 unidades internacionales (UI) de vitamina D por da. Si tiene ms de 70 aos, reciba como mnimo 1200 mg de calcio y al menos 800 unidades internacionales (UI) de vitamina D por da. Fumar y beber alcohol en exceso aumentan el riesgo de osteoporosis. Consuma alimentos ricos en calcio y vitamina D, y haga ejercicios con soporte de peso varias veces a la [place], como se lo haya indicado el mdico. De qu manera la menopausia afecta mi salud mental? La depresin puede presentarse a cualquier edad, pero es ms frecuente a  medida que una persona envejece. Los sntomas comunes de depresin incluyen lo siguiente: Sensacin de depresin. Cambios en los patrones de sueo. Cambios en el apetito o en los hbitos de alimentacin. Sensacin de falta general de motivacin o placer al [employer] actividades que sola disfrutar. Crisis frecuentes de llanto. Hable con el mdico si cree que est experimentando alguno de estos sntomas. Indicaciones generales Visite a su mdico para hacerse exmenes de bienestar peridicos y aplicarse vacunas. Puede incluir: Programar exmenes peridicos dentales, de la salud y de Public librarian. Recibir y [employer]. Estos incluyen los siguientes: Human resources officer. Aplquese esta vacuna todos los aos antes de que comience la temporada de gripe. Vacuna contra la neumona. Vacuna contra el herpes. Vacuna contra el ttanos, la difteria y la tos Dyann Ruddle (Tdap). El mdico tambin puede recomendarle que se aplique otras vacunas. Notifique a su mdico si alguna vez ha sido vctima de abuso o si no se siente seguro en su hogar. Resumen La menopausia es un proceso normal en el cual la capacidad de quedar embarazada llega a su fin. Esta condicin causa acaloramientos, sudoraciones nocturnas, disminucin del inters en el sexo, cambios en el estado de nimo, dolores de [place] o falta de sueo. El tratamiento de esta afeccin puede incluir una terapia de reemplazo hormonal. Tome medidas para mantenerse 61, entre ellas, hacer ejercicio con regularidad, seguir una dieta saludable, controlar su peso y medirse la presin arterial y los niveles de Dispensing optician. Hgase pruebas para Film/video editor y depresin. [place]  con todas las vacunas. Esta informacin no tiene Marine scientist el consejo del mdico. Asegrese de hacerle al mdico cualquier pregunta que tenga. Document Revised: 06/23/2020 Document Reviewed: 06/23/2020 Elsevier Patient Education  [place].

## 2021-02-24 NOTE — Assessment & Plan Note (Signed)
Preventative protocols reviewed and updated unless pt declined. Discussed healthy diet and lifestyle.  

## 2021-02-24 NOTE — Progress Notes (Signed)
Patient ID: Melinda Hall, female    DOB: [birth year], 61 y.o.   MRN: [id number]  This visit was conducted in person.  BP 130/82    Pulse 63    Temp 97.6 F (36.4 C) (Temporal)    Ht 5' 2.75" (1.594 m)    Wt 162 lb 2 oz (73.5 kg)    SpO2 100%    BMI 28.95 kg/m    CC: CPE Subjective:   HPI: Melinda Hall is a 61 y.o. female presenting on 02/24/2021 for Annual Exam   S/p roux en y gastric bypass 2004.  S/p cervical neck surgery ([facility]) 2018.  Diagnosed 2022 with stage 3 follicular lymphoma s/p chemo course (IV Rituxan x4 wks 05/2020) - [facility]. Last seen 12/2020 planning Q6 mo monitoring for 2 yrs.  Thyroid biopsy x2 returned benign (follicular thyroid nodules).   Not feeling well this past month - notes drenching night sweats and fatigue without fever or weight loss. Over the past week noticing chest pressure sensation that comes on at rest, associated with nausea, once woke her up from sleep and led her to vomit. Symptoms not exertional. S/p cholecystectomy 2000s.    Preventative: Colonoscopy 11/2015 - TA rpt 5 yrs ([facility]). Due - will refer.  Well woman with OBGYN (Dr Kennon Rounds) 05/2017. S/p bilateral oophorectomy for dermoid cysts age 34 yo. Never used HRT.  Mammo 03/2020 - Birads1 @ [facility]  DEXA 11/2015: T -2.6 spine osteoporosis, saw endo Dr Graceann Congress, rec against treatment at this time.  DEXA 02/2019 T -2.7 at spine - started on prolia, last injection 10/14/2020.  Lung cancer screening - not eligible  Flu shot yearly [place] 04/2019, 09/2019, no booster Tdap 2018  Shingrix - discussed. Declines as she has never had chicken pox.  Seat belt use discussed  Sunscreen use discussed. 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 Interim medical history since our last visit reviewed. Allergies and medications reviewed and updated. Outpatient Medications Prior to Visit  Medication Sig Dispense Refill   calcium carbonate (OS-CAL) 600 MG TABS tablet Take 600 mg by mouth daily with breakfast.     Cholecalciferol (VITAMIN D3) 1000 units CAPS Take 3 capsules (3,000 Units total) by mouth daily.     denosumab (PROLIA) 60 MG/ML SOSY injection INJECT 60MG  UNDER THE SKIN EVERY 6 MONTHS 1 mL 0   ferrous sulfate 325 (65 FE) MG tablet Take 1 tablet (325 mg total) by mouth every Monday, Wednesday, and Friday.     Magnesium 250 MG TABS Take 1 tablet (250 mg total) by mouth daily.  0   methocarbamol (ROBAXIN) 500 MG tablet Take 1 tablet (500 mg total) by mouth 3 (three) times daily as needed for muscle spasms (sedation precautions). 40 tablet 1   vitamin B-12 (CYANOCOBALAMIN) 1000 MCG tablet Take 1 tablet (1,000 mcg total) by mouth daily.     zinc gluconate 50 MG tablet Take 1 tablet (50 mg total) by mouth daily.     sulfamethoxazole-trimethoprim (BACTRIM DS) 800-160 MG tablet Take 1 tablet by mouth 2 (two) times daily. 10 tablet 0   prochlorperazine (COMPAZINE)  10 MG tablet Take 1 tablet (10 mg total) by mouth every 6 (six) hours as needed for nausea or vomiting. 60 tablet 0   No facility-administered medications prior to visit.     Per HPI unless specifically indicated in ROS section below Review of Systems  Constitutional:  Positive for diaphoresis (night sweats) and fatigue. Negative for activity change, appetite change, chills, fever and unexpected weight change.  HENT:  Negative for hearing loss.   Eyes:  Negative for visual disturbance.  Respiratory:  Positive for chest tightness. Negative for cough, shortness of breath and wheezing.   Cardiovascular:  Positive for palpitations (rare at night). Negative  for chest pain and leg swelling.  Gastrointestinal:  Positive for nausea. Negative for abdominal distention, abdominal pain, blood in stool, constipation, diarrhea and vomiting.  Genitourinary:  Negative for difficulty urinating and hematuria.  Musculoskeletal:  Negative for arthralgias, myalgias and neck pain.  Skin:  Negative for rash.  Neurological:  Negative for dizziness, seizures, syncope and headaches.  Hematological:  Negative for adenopathy. Does not bruise/bleed easily.  Psychiatric/Behavioral:  Negative for dysphoric mood. The patient is not nervous/anxious.    Objective:  BP 130/82    Pulse 63    Temp 97.6 F (36.4 C) (Temporal)    Ht 5' 2.75" (1.594 m)    Wt 162 lb 2 oz (73.5 kg)    SpO2 100%    BMI 28.95 kg/m   Wt Readings from Last 3 Encounters:  02/24/21 162 lb 2 oz (73.5 kg)  12/02/20 163 lb 1.6 oz (74 kg)  08/27/20 162 lb 8 oz (73.7 kg)      Physical Exam Vitals and nursing note reviewed.  Constitutional:      Appearance: Normal appearance. She is not ill-appearing.  HENT:     Head: Normocephalic and atraumatic.     Right Ear: Tympanic membrane, ear canal and external ear normal. There is no impacted cerumen.     Left Ear: Tympanic membrane, ear canal and external ear normal. There is no impacted cerumen.  Eyes:     General:        Right eye: No discharge.        Left eye: No discharge.     Extraocular Movements: Extraocular movements intact.     Conjunctiva/sclera: Conjunctivae normal.     Comments: Chronic anisocoria, cataracts present  Neck:     Thyroid: No thyroid mass or thyromegaly.  Cardiovascular:     Rate and Rhythm: Normal rate and regular rhythm.     Pulses: Normal pulses.     Heart sounds: Normal heart sounds. No murmur heard. Pulmonary:     Effort: Pulmonary effort is normal. No respiratory distress.     Breath sounds: Normal breath sounds. No wheezing, rhonchi or rales.  Abdominal:     General: Bowel sounds are normal. There is no  distension.     Palpations: Abdomen is soft. There is no hepatomegaly, splenomegaly or mass.     Tenderness: There is no abdominal tenderness. There is no guarding or rebound.     Hernia: No hernia is present.  Musculoskeletal:     Cervical back: Normal range of motion and neck supple. No rigidity.     Right lower leg: No edema.     Left lower leg: No edema.  Lymphadenopathy:     Head:     Right side of head: No submental, submandibular, tonsillar or preauricular adenopathy.     Left side of head: No submental, submandibular,  tonsillar or preauricular adenopathy.     Cervical: No cervical adenopathy.     Upper Body:     Right upper body: No supraclavicular adenopathy.     Left upper body: No supraclavicular adenopathy.  Skin:    General: Skin is warm and dry.     Findings: No rash.  Neurological:     General: No focal deficit present.     Mental Status: She is alert. Mental status is at baseline.  Psychiatric:        Mood and Affect: Mood normal.        Behavior: Behavior normal.      Results for orders placed or performed in visit on 02/24/21  Lactate dehydrogenase  Result Value Ref Range   LDH 175 120 - 250 U/L   EKG - sinus bradycardia ~50s with rate variability and PAC, normal axis, intervals, no hypertrophy or acute ST/T changes   Assessment & Plan:  This visit occurred during the SARS-CoV-2 public health emergency.  Safety protocols were in place, including screening questions prior to the visit, additional usage of staff PPE, and extensive cleaning of exam room while observing appropriate contact time as indicated for disinfecting solutions.   Problem List Items Addressed This Visit     Status post gastric bypass for obesity (Chronic)    Update labs.       Relevant Orders   Vitamin B12   Ferritin   IBC panel   Folate   VITAMIN D 25 Hydroxy (Vit-D Deficiency, Fractures)   Vitamin B1   Lipid panel   Comprehensive metabolic panel   TSH   Hemoglobin A1c   CBC  with Differential/Platelet   Encounter for general adult medical examination with abnormal findings - Primary (Chronic)    Preventative protocols reviewed and updated unless pt declined. Discussed healthy diet and lifestyle.       Iron deficiency    Update labs on oral iron replacement MWF      Relevant Orders   Ferritin   IBC panel   Overweight (BMI 25.0-29.9)    Overall stable period.       Adjustment disorder with mixed anxiety and depressed mood    Stable period off medication.       Osteoporosis    Continues prolia, vit D and calcium supplementation.  Encouraged continued regular weight bearing exercise.       Relevant Orders   VITAMIN D 25 Hydroxy (Vit-D Deficiency, Fractures)   Anisocoria    Chronic, stable. L pupil > R       Follicular lymphoma ([place])    Regularly seeing oncology.  Endorsing return of concerning B symptoms of drenching night sweats and fatigue - advised call for expedited onc f/u. No obvious lymphadenopathy or hepatosplenomegaly on exam today. Will check further labwork.       Relevant Orders   Comprehensive metabolic panel   CBC with Differential/Platelet   Lactate dehydrogenase (Completed)   Multiple thyroid nodules    S/p benign biopsy of 2 thyroid nodules last year      Chest discomfort    Endorsing 1 week of chest discomfort. Exam reproduces chest pain with palpation of left 2nd costochondral junction - suspect costochondritis. Check EKG today, discussed voltaren gel to tender areas and gentle chest wall stretching.       Relevant Orders   EKG 12-Lead (Completed)   Other Visit Diagnoses     Need for influenza vaccination       Relevant Orders  Flu Vaccine QUAD 87mo+IM (Fluarix, Fluzone & Alfiuria Quad PF) (Completed)   Special screening for malignant neoplasms, colon       Relevant Orders   Ambulatory referral to Gastroenterology        No orders of the defined types were placed in this encounter.  Orders Placed This  Encounter  Procedures   Flu Vaccine QUAD 94mo+IM (Fluarix, Fluzone & Alfiuria Quad PF)   Vitamin B12   Ferritin   IBC panel   Folate   VITAMIN D 25 Hydroxy (Vit-D Deficiency, Fractures)   Vitamin B1   Lipid panel   Comprehensive metabolic panel   TSH   Hemoglobin A1c   CBC with Differential/Platelet   Lactate dehydrogenase   Ambulatory referral to Gastroenterology    Referral Priority:   Routine    Referral Type:   Consultation    Referral Reason:   Specialty Services Required    Number of Visits Requested:   1   EKG 12-Lead    Patient instructions: Flu shot today  Labs today  EKG today  Llame a Dr Grayland Ormond para hacer cita para evaluar estos sintomas de sudor y Programmer, applications. [phone]  Laboratorios hoy.  La remitiremos de nuevo a Dr Allen Norris para colonoscopia.  Regresar en 4-6 meses para proxima visita  Follow up plan: Return if symptoms worsen or fail to improve.  [name], MD

## 2021-02-25 ENCOUNTER — Encounter: Payer: Self-pay | Admitting: Oncology

## 2021-02-25 ENCOUNTER — Telehealth: Payer: Self-pay | Admitting: Family Medicine

## 2021-02-25 ENCOUNTER — Telehealth: Payer: Self-pay

## 2021-02-25 LAB — COMPREHENSIVE METABOLIC PANEL WITH GFR
ALT: 34 U/L (ref 0–35)
AST: 24 U/L (ref 0–37)
Albumin: 4.4 g/dL (ref 3.5–5.2)
Alkaline Phosphatase: 48 U/L (ref 39–117)
BUN: 18 mg/dL (ref 6–23)
CO2: 31 meq/L (ref 19–32)
Calcium: 10.2 mg/dL (ref 8.4–10.5)
Chloride: 102 meq/L (ref 96–112)
Creatinine, Ser: 0.64 mg/dL (ref 0.40–1.20)
GFR: 95.48 mL/min
Glucose, Bld: 77 mg/dL (ref 70–99)
Potassium: 4.6 meq/L (ref 3.5–5.1)
Sodium: 140 meq/L (ref 135–145)
Total Bilirubin: 0.9 mg/dL (ref 0.2–1.2)
Total Protein: 6.6 g/dL (ref 6.0–8.3)

## 2021-02-25 LAB — VITAMIN B12: Vitamin B-12: 492 pg/mL (ref 211–911)

## 2021-02-25 LAB — IBC PANEL
Iron: 93 ug/dL (ref 42–145)
Saturation Ratios: 27.4 % (ref 20.0–50.0)
TIBC: 338.8 ug/dL (ref 250.0–450.0)
Transferrin: 242 mg/dL (ref 212.0–360.0)

## 2021-02-25 LAB — CBC WITH DIFFERENTIAL/PLATELET
Basophils Absolute: 0 10*3/uL (ref 0.0–0.1)
Basophils Relative: 0.9 % (ref 0.0–3.0)
Eosinophils Absolute: 0.1 10*3/uL (ref 0.0–0.7)
Eosinophils Relative: 1.2 % (ref 0.0–5.0)
HCT: 44.7 % (ref 36.0–46.0)
Hemoglobin: 14.6 g/dL (ref 12.0–15.0)
Lymphocytes Relative: 30.5 % (ref 12.0–46.0)
Lymphs Abs: 1.6 10*3/uL (ref 0.7–4.0)
MCHC: 32.7 g/dL (ref 30.0–36.0)
MCV: 93 fl (ref 78.0–100.0)
Monocytes Absolute: 0.5 10*3/uL (ref 0.1–1.0)
Monocytes Relative: 9.2 % (ref 3.0–12.0)
Neutro Abs: 3 10*3/uL (ref 1.4–7.7)
Neutrophils Relative %: 58.2 % (ref 43.0–77.0)
Platelets: 189 10*3/uL (ref 150.0–400.0)
RBC: 4.8 Mil/uL (ref 3.87–5.11)
RDW: 13.9 % (ref 11.5–15.5)
WBC: 5.2 10*3/uL (ref 4.0–10.5)

## 2021-02-25 LAB — FOLATE: Folate: 19 ng/mL (ref >5.9)

## 2021-02-25 LAB — FERRITIN: Ferritin: 42.8 ng/mL (ref 10.0–291.0)

## 2021-02-25 LAB — LIPID PANEL
Cholesterol: 166 mg/dL (ref 0–200)
HDL: 68.4 mg/dL
LDL Cholesterol: 84 mg/dL (ref 0–99)
NonHDL: 97.76
Total CHOL/HDL Ratio: 2
Triglycerides: 70 mg/dL (ref 0.0–149.0)
VLDL: 14 mg/dL (ref 0.0–40.0)

## 2021-02-25 LAB — HEMOGLOBIN A1C: Hgb A1c MFr Bld: 5.1 % (ref 4.6–6.5)

## 2021-02-25 LAB — TSH: TSH: 3.07 u[IU]/mL (ref 0.35–5.50)

## 2021-02-25 LAB — VITAMIN D 25 HYDROXY (VIT D DEFICIENCY, FRACTURES): VITD: 52.24 ng/mL (ref 30.00–100.00)

## 2021-02-25 NOTE — Assessment & Plan Note (Signed)
Update labs.  

## 2021-02-25 NOTE — Telephone Encounter (Signed)
CALLED PATIENT NO ANSWER LEFT VOICEMAIL FOR A CALL BACK letter sent called with interp

## 2021-02-25 NOTE — Telephone Encounter (Signed)
Thank you :)

## 2021-02-25 NOTE — Assessment & Plan Note (Addendum)
Endorsing 1 week of chest discomfort. Exam reproduces chest pain with palpation of left 2nd costochondral junction - suspect costochondritis. Check EKG today, discussed voltaren gel to tender areas and gentle chest wall stretching.

## 2021-02-25 NOTE — Assessment & Plan Note (Signed)
Stable period off medication.  

## 2021-02-25 NOTE — Assessment & Plan Note (Signed)
Overall stable period.  

## 2021-02-25 NOTE — Assessment & Plan Note (Signed)
Chronic, stable. L pupil > R

## 2021-02-25 NOTE — Assessment & Plan Note (Signed)
S/p benign biopsy of 2 thyroid nodules last year

## 2021-02-25 NOTE — Assessment & Plan Note (Signed)
Update labs on oral iron replacement MWF

## 2021-02-25 NOTE — Assessment & Plan Note (Addendum)
Regularly seeing oncology.  Endorsing return of concerning B symptoms of drenching night sweats and fatigue - advised call for expedited onc f/u. No obvious lymphadenopathy or hepatosplenomegaly on exam today. Will check further labwork.

## 2021-02-25 NOTE — Assessment & Plan Note (Signed)
Continues prolia, vit D and calcium supplementation.  Encouraged continued regular weight bearing exercise.

## 2021-02-25 NOTE — Telephone Encounter (Signed)
Pt seen yesterday for annual physical exam, endorsed 1 month of drenching night sweats and fatigue without fever or weight loss or new lymphadenopathy.  Next onc appt is not until 05/2021.  I did draw labwork and asked her to call oncology to schedule sooner appointment for further evaluation of these symptoms.  Will forward to onc team as fyi.

## 2021-02-28 LAB — VITAMIN B1: Vitamin B1 (Thiamine): 13 nmol/L (ref 8–30)

## 2021-02-28 LAB — LACTATE DEHYDROGENASE: LDH: 175 U/L (ref 120–250)

## 2021-03-05 ENCOUNTER — Other Ambulatory Visit: Payer: Self-pay | Admitting: Family Medicine

## 2021-03-05 DIAGNOSIS — M810 Age-related osteoporosis without current pathological fracture: Secondary | ICD-10-CM

## 2021-03-08 ENCOUNTER — Telehealth: Payer: Self-pay

## 2021-03-08 NOTE — Telephone Encounter (Signed)
Started PA for Prolia for [employer] via fax. Pending decision

## 2021-03-09 NOTE — Telephone Encounter (Signed)
PA approved from 03/08/21-03/08/22.

## 2021-03-15 ENCOUNTER — Ambulatory Visit
Admission: RE | Admit: 2021-03-15 | Discharge: 2021-03-15 | Disposition: A | Discharge status: Home / Self Care | Payer: BC Managed Care – PPO | Source: Ambulatory Visit | Attending: Oncology | Admitting: Oncology

## 2021-03-15 ENCOUNTER — Telehealth: Payer: Self-pay | Admitting: *Deleted

## 2021-03-15 ENCOUNTER — Other Ambulatory Visit: Payer: Self-pay

## 2021-03-15 DIAGNOSIS — C8218 Follicular lymphoma grade II, lymph nodes of multiple sites: Secondary | ICD-10-CM | POA: Insufficient documentation

## 2021-03-15 DIAGNOSIS — C8590 Non-Hodgkin lymphoma, unspecified, unspecified site: Secondary | ICD-10-CM | POA: Diagnosis not present

## 2021-03-15 DIAGNOSIS — L929 Granulomatous disorder of the skin and subcutaneous tissue, unspecified: Secondary | ICD-10-CM | POA: Diagnosis not present

## 2021-03-15 DIAGNOSIS — D1800 Hemangioma unspecified site: Secondary | ICD-10-CM | POA: Diagnosis not present

## 2021-03-15 DIAGNOSIS — K769 Liver disease, unspecified: Secondary | ICD-10-CM | POA: Diagnosis not present

## 2021-03-15 MED ORDER — IOHEXOL 300 MG/ML  SOLN
100.0000 mL | Freq: Once | INTRAMUSCULAR | Status: AC | PRN
  Administered 2021-03-15: 100 mL via INTRAVENOUS

## 2021-03-15 NOTE — Progress Notes (Signed)
[place]  Telephone:(336) [phone] Fax:(336) [phone]  ID: Melinda Hall OB: [birth year]  MR#: [id number]  ZTI#:[id number]  Patient Care Team: [name], MD as PCP - General (Family Medicine) [name], MD as Consulting Physician (Oncology) [name], MD as Consulting Physician (Neurosurgery)  CHIEF COMPLAINT: At least stage III follicular lymphoma, grade 1/2.    INTERVAL HISTORY: Patient returns to clinic today for further evaluation and discussion of her imaging results.  Over the past several months she has noticed increased weakness and fatigue as well as increasing night sweats.  She also has occasional mid abdominal discomfort.  She has no neurologic complaints.  She denies any recent fevers or illnesses. She has no chest pain, shortness of breath, cough, or hemoptysis.  She denies any nausea, vomiting, constipation, or diarrhea.  She has no urinary complaints.  Patient offers no further specific complaints today.    REVIEW OF SYSTEMS:   Review of Systems  Constitutional:  Positive for diaphoresis and malaise/fatigue. Negative for fever and weight loss.  Respiratory: Negative.  Negative for cough, hemoptysis and shortness of breath.   Cardiovascular: Negative.  Negative for chest pain and leg swelling.  Gastrointestinal:  Positive for abdominal pain.  Genitourinary: Negative.  Negative for dysuria.  Musculoskeletal: Negative.  Negative for back pain.  Skin: Negative.  Negative for rash.  Neurological:  Positive for weakness. Negative for tingling, sensory change, focal weakness and headaches.  Psychiatric/Behavioral: Negative.  The patient is not nervous/anxious.    As per HPI. Otherwise, a complete review of systems is negative.  PAST MEDICAL HISTORY: Past Medical History:  Diagnosis Date   Cancer ([place])    Dermoid tumor    hx of--left ovary   Gastric bypass status for obesity 2004   s/p roux-en-y   Generalized headaches     frequent   Heart murmur    History of cardiac murmur    Intraductal papilloma of breast, left 06/18/2017   05/2017 - s/p excision 07/2017   Osteoporosis 03/2016   DEXA -2.6 spine   Salivary gland stone    x 2   Smoker     PAST SURGICAL HISTORY: Past Surgical History:  Procedure Laterality Date   ABDOMINAL HYSTERECTOMY     only ovary was removed   ANTERIOR CERVICAL DECOMP/DISCECTOMY FUSION  12/2016   [place]  ~1992   for dermoid ovarian cysts   BREAST BIOPSY Left 06/13/2017   papilloma with excision 07/2017   BREAST LUMPECTOMY WITH NEEDLE LOCALIZATION Left 08/10/2017   intraductal papilloma - Pabon, [place], MD   CHOLECYSTECTOMY  2000s   COLONOSCOPY WITH PROPOFOL N/A 11/27/2015   TA rpt 5 yrs Lucilla Lame, MD)   GASTRIC BYPASS  2004   roux en y [facility])   POLYPECTOMY  11/27/2015   Procedure: POLYPECTOMY;  Surgeon: Lucilla Lame, MD;  Location: [facility];  Service: Endoscopy;;   REDUCTION MAMMAPLASTY Bilateral 38's   SALIVARY GLAND SURGERY     SALIVARY STONE REMOVAL      FAMILY HISTORY: Family History  Problem Relation Age of Onset   Cancer Father 35       leukemia   Hypertension Mother    Diabetes Neg Hx    Stroke Neg Hx    Coronary artery disease Neg Hx    Breast cancer Neg Hx     ADVANCED DIRECTIVES (Y/N):  N  HEALTH MAINTENANCE: Social History   Tobacco Use   Smoking  status: Former    Packs/day: 0.25    Years: 20.00    Pack years: 5.00    Types: Cigarettes    Quit date: 10/31/2012    Years since quitting: 8.3   Smokeless tobacco: Never  Vaping Use   Vaping Use: Never used  Substance Use Topics   Alcohol use: Not Currently   Drug use: No     Colonoscopy:  PAP:  Bone density:  Lipid panel:  Allergies  Allergen Reactions   Boniva [Ibandronic Acid] Other (See Comments)    Diffuse body aches    Current Outpatient Medications  Medication Sig Dispense Refill   calcium carbonate (OS-CAL) 600  MG TABS tablet Take 600 mg by mouth daily with breakfast.     Cholecalciferol (VITAMIN D3) 1000 units CAPS Take 3 capsules (3,000 Units total) by mouth daily.     denosumab (PROLIA) 60 MG/ML SOSY injection INJECT 60MG  UNDER THE SKIN EVERY 6 MONTHS 1 mL 0   ferrous sulfate 325 (65 FE) MG tablet Take 1 tablet (325 mg total) by mouth every Monday, Wednesday, and Friday.     Magnesium 250 MG TABS Take 1 tablet (250 mg total) by mouth daily.  0   methocarbamol (ROBAXIN) 500 MG tablet Take 1 tablet (500 mg total) by mouth 3 (three) times daily as needed for muscle spasms (sedation precautions). 40 tablet 1   vitamin B-12 (CYANOCOBALAMIN) 1000 MCG tablet Take 1 tablet (1,000 mcg total) by mouth daily.     zinc gluconate 50 MG tablet Take 1 tablet (50 mg total) by mouth daily.     No current facility-administered medications for this visit.    OBJECTIVE: Vitals:   03/17/21 0955  BP: (!) 132/98  Pulse: (!) 55  Temp: (!) 97.1 F (36.2 C)     Body mass index is 29.32 kg/m.    ECOG FS:0 - Asymptomatic  General: Well-developed, well-nourished, no acute distress. Eyes: Pink conjunctiva, anicteric sclera. HEENT: Normocephalic, moist mucous membranes. Lungs: No audible wheezing or coughing. Heart: Regular rate and rhythm. Abdomen: Soft, nontender, no obvious distention. Musculoskeletal: No edema, cyanosis, or clubbing. Neuro: Alert, answering all questions appropriately. Cranial nerves grossly intact. Skin: No rashes or petechiae noted. Psych: Normal affect.   LAB RESULTS:  Lab Results  Component Value Date   NA 134 (L) 03/17/2021   K 4.9 03/17/2021   CL 102 03/17/2021   CO2 27 03/17/2021   GLUCOSE 88 03/17/2021   BUN 17 03/17/2021   CREATININE 0.52 03/17/2021   CALCIUM 8.8 (L) 03/17/2021   PROT 7.1 03/17/2021   ALBUMIN 4.3 03/17/2021   AST 39 03/17/2021   ALT 72 (H) 03/17/2021   ALKPHOS 52 03/17/2021   BILITOT 0.5 03/17/2021   GFRNONAA >60 03/17/2021   GFRAA >60 07/10/2013     Lab Results  Component Value Date   WBC 6.3 03/17/2021   NEUTROABS 3.6 03/17/2021   HGB 15.6 (H) 03/17/2021   HCT 47.7 (H) 03/17/2021   MCV 94.5 03/17/2021   PLT 194 03/17/2021     STUDIES: CT Abdomen Pelvis W Contrast  Result Date: 03/15/2021 CLINICAL DATA:  Follow-up lymphoma. EXAM: CT ABDOMEN AND PELVIS WITH CONTRAST TECHNIQUE: Multidetector CT imaging of the abdomen and pelvis was performed using the standard protocol following bolus administration of intravenous contrast. RADIATION DOSE REDUCTION: This exam was performed according to the departmental dose-optimization program which includes automated exposure control, adjustment of the mA and/or kV according to patient size and/or use of iterative reconstruction technique. CONTRAST:  123mL OMNIPAQUE IOHEXOL 300 MG/ML  SOLN COMPARISON:  Multiple priors including most recent CT November 27, 2020 FINDINGS: Lower chest: No acute abnormality. Hepatobiliary: Tiny hypodense hepatic lesions are stable from prior examinations and statistically likely to reflect benign cysts or hemangiomas. Gallbladder surgically absent. Mild prominence of the biliary tree is favored reservoir effect post cholecystectomy. Pancreas: No pancreatic ductal dilation or evidence of acute inflammation. Spleen: Normal in size without focal abnormality. Adrenals/Urinary Tract: Bilateral adrenal glands appear normal. No hydronephrosis. Kidneys demonstrate symmetric enhancement excretion of contrast material. No solid enhancing renal mass. Urinary bladder is unremarkable for degree of distension. Stomach/Bowel: Radiopaque enteric contrast material traverses the cecum. Prior Roux-en-Y gastric bypass. No pathologic dilation of large or small bowel. Terminal ileum appears normal. The appendix is not confidently identified however there is no pericecal inflammation. Moderate volume of formed stool throughout the colon suggestive of constipation. No evidence of acute bowel  inflammation. Vascular/Lymphatic: Normal caliber abdominal aorta. Interval increase in size of the right retroperitoneal soft tissue mass which extends along the crus of the diaphragm. The mass effaces/narrows the IVC and left renal vein now measuring 4.1 x 2.8 cm on image 34/2 previously 2.6 x 1.8 cm. No new sites of abdominopelvic adenopathy. Reproductive: Uterus and bilateral adnexa are unremarkable. Other: No significant abdominopelvic free fluid. No pneumoperitoneum. Postsurgical change in the anterior abdominal wall. Calcified granulomata in the posterior left gluteal subcutaneous soft tissues. Musculoskeletal: L3 vertebral body hemangioma. Multilevel degenerative changes spine. IMPRESSION: 1. Interval increase in size of the right retroperitoneal soft tissue mass which now effaces/narrows the IVC and left renal vein and measures 4.1 x 2.8 cm previously 2.6 x 1.8 cm. 2. No new sites of abdominopelvic adenopathy and no splenomegaly. 3. Moderate volume of formed stool throughout the colon suggestive of constipation. 4. Prior Roux-en-Y gastric bypass without evidence of bowel obstruction. These results will be called to the ordering clinician or representative by the Radiologist Assistant, and communication documented in the PACS or [employer]. Electronically Signed   By: Dahlia Bailiff M.D.   On: 03/15/2021 12:38     ASSESSMENT: At least stage III follicular lymphoma, grade 1/2.    PLAN:    1.  At least stage III follicular lymphoma, grade 1/2: Retroperitoneal lymph node biopsy on April 21, 2020 confirmed the diagnosis. PET scan on April 14, 2020 revealed a hypermetabolic right sided retroperitoneal nodal mass along with hypermetabolic mesenteric adenopathy.  Patient also noted to have bilateral axillary adenopathy.  Although patient was asymptomatic and active surveillance was offered, she elected to pursue treatment and received IV Rituxan weekly x4 completing on May 28, 2020.  Repeat PET scan  from August 24, 2020 reviewed independently with significant treatment response, although likely residual disease.  Patient previously declined maintenance Rituxan.  Repeat imaging on March 15, 2021 reviewed independently and report as above with progression of disease.  Patient is also symptomatic.  She has agreed to reinitiate weekly Rituxan x4 followed by maintenance Rituxan.  Can also consider Zevalin in the near future.  Return to clinic in 1 week to initiate cycle 1 of weekly Rituxan.   2.  Hypermetabolic thyroid nodule: Previously, biopsy was negative for malignancy. 3.  Elevated liver enzymes: Resolved. 4.  Polycythemia: Mild, monitor. 5.  Leukopenia: Resolved.   Patient expressed understanding and was in agreement with this plan. She also understands that She can call clinic at any time with any questions, concerns, or complaints.    Cancer Staging  Follicular lymphoma ([place])  Staging form: Hodgkin and Non-Hodgkin Lymphoma, AJCC 8th Edition - Clinical stage from 09/10/8865: Stage III (Follicular lymphoma) - Signed by [name], MD on 04/29/2020 Stage prefix: Initial diagnosis   [name], MD   03/18/2021 2:40 PM

## 2021-03-15 NOTE — Telephone Encounter (Signed)
Called report  IMPRESSION: 1. Interval increase in size of the right retroperitoneal soft tissue mass which now effaces/narrows the IVC and left renal vein and measures 4.1 x 2.8 cm previously 2.6 x 1.8 cm. 2. No new sites of abdominopelvic adenopathy and no splenomegaly. 3. Moderate volume of formed stool throughout the colon suggestive of constipation. 4. Prior Roux-en-Y gastric bypass without evidence of bowel obstruction.   These results will be called to the ordering clinician or representative by the Radiologist Assistant, and communication documented in the PACS or [employer].     Electronically Signed   By: Dahlia Bailiff M.D.   On: 03/15/2021 12:38

## 2021-03-17 ENCOUNTER — Inpatient Hospital Stay: Payer: BC Managed Care – PPO | Attending: Oncology | Admitting: Oncology

## 2021-03-17 ENCOUNTER — Inpatient Hospital Stay: Payer: BC Managed Care – PPO

## 2021-03-17 ENCOUNTER — Encounter: Payer: Self-pay | Admitting: Oncology

## 2021-03-17 ENCOUNTER — Other Ambulatory Visit: Payer: Self-pay

## 2021-03-17 VITALS — BP 132/98 | HR 55 | Temp 97.1°F | Wt 164.2 lb

## 2021-03-17 DIAGNOSIS — E041 Nontoxic single thyroid nodule: Secondary | ICD-10-CM | POA: Diagnosis not present

## 2021-03-17 DIAGNOSIS — Z9884 Bariatric surgery status: Secondary | ICD-10-CM | POA: Diagnosis not present

## 2021-03-17 DIAGNOSIS — C8218 Follicular lymphoma grade II, lymph nodes of multiple sites: Secondary | ICD-10-CM

## 2021-03-17 DIAGNOSIS — C8200 Follicular lymphoma grade I, unspecified site: Secondary | ICD-10-CM | POA: Insufficient documentation

## 2021-03-17 DIAGNOSIS — D751 Secondary polycythemia: Secondary | ICD-10-CM | POA: Insufficient documentation

## 2021-03-17 LAB — CBC WITH DIFFERENTIAL/PLATELET
Abs Immature Granulocytes: 0.01 10*3/uL (ref 0.00–0.07)
Basophils Absolute: 0 10*3/uL (ref 0.0–0.1)
Basophils Relative: 1 %
Eosinophils Absolute: 0.1 10*3/uL (ref 0.0–0.5)
Eosinophils Relative: 1 %
HCT: 47.7 % — ABNORMAL HIGH (ref 36.0–46.0)
Hemoglobin: 15.6 g/dL — ABNORMAL HIGH (ref 12.0–15.0)
Immature Granulocytes: 0 %
Lymphocytes Relative: 33 %
Lymphs Abs: 2.1 10*3/uL (ref 0.7–4.0)
MCH: 30.9 pg (ref 26.0–34.0)
MCHC: 32.7 g/dL (ref 30.0–36.0)
MCV: 94.5 fL (ref 80.0–100.0)
Monocytes Absolute: 0.6 10*3/uL (ref 0.1–1.0)
Monocytes Relative: 9 %
Neutro Abs: 3.6 10*3/uL (ref 1.7–7.7)
Neutrophils Relative %: 56 %
Platelets: 194 10*3/uL (ref 150–400)
RBC: 5.05 MIL/uL (ref 3.87–5.11)
RDW: 13.5 % (ref 11.5–15.5)
WBC: 6.3 10*3/uL (ref 4.0–10.5)
nRBC: 0 % (ref 0.0–0.2)

## 2021-03-17 LAB — COMPREHENSIVE METABOLIC PANEL
ALT: 72 U/L — ABNORMAL HIGH (ref 0–44)
AST: 39 U/L (ref 15–41)
Albumin: 4.3 g/dL (ref 3.5–5.0)
Alkaline Phosphatase: 52 U/L (ref 38–126)
Anion gap: 5 (ref 5–15)
BUN: 17 mg/dL (ref 8–23)
CO2: 27 mmol/L (ref 22–32)
Calcium: 8.8 mg/dL — ABNORMAL LOW (ref 8.9–10.3)
Chloride: 102 mmol/L (ref 98–111)
Creatinine, Ser: 0.52 mg/dL (ref 0.44–1.00)
GFR, Estimated: >60 mL/min (ref >60)
Glucose, Bld: 88 mg/dL (ref 70–99)
Potassium: 4.9 mmol/L (ref 3.5–5.1)
Sodium: 134 mmol/L — ABNORMAL LOW (ref 135–145)
Total Bilirubin: 0.5 mg/dL (ref 0.3–1.2)
Total Protein: 7.1 g/dL (ref 6.5–8.1)

## 2021-03-17 NOTE — Progress Notes (Signed)
Pt said that her body is really weak and she feels like it cannot keep up. X1 month that she has been feeling like this and its getting worse. Pt has severe night sweats that is causing her to have to get up in the middle of the night to change her clothes.  Per Daughter mother was acting very lethargic and pale on Sunday. She said that she felt dizzy.

## 2021-03-18 ENCOUNTER — Encounter: Payer: Self-pay | Admitting: Oncology

## 2021-03-22 NOTE — Progress Notes (Unsigned)
[place]  Telephone:(336) [phone] Fax:(336) [phone]  ID: Melinda Hall OB: 1960-08-23  MR#: [id number]  WNI#:[id number]  Patient Care Team: [name], MD as PCP - General (Family Medicine) [name], MD as Consulting Physician (Oncology) [name], MD as Consulting Physician (Neurosurgery)  CHIEF COMPLAINT: At least stage III follicular lymphoma, grade 1/2.    INTERVAL HISTORY: Patient returns to clinic today for further evaluation and discussion of her imaging results.  Over the past several months she has noticed increased weakness and fatigue as well as increasing night sweats.  She also has occasional mid abdominal discomfort.  She has no neurologic complaints.  She denies any recent fevers or illnesses. She has no chest pain, shortness of breath, cough, or hemoptysis.  She denies any nausea, vomiting, constipation, or diarrhea.  She has no urinary complaints.  Patient offers no further specific complaints today.    REVIEW OF SYSTEMS:   Review of Systems  Constitutional:  Positive for diaphoresis and malaise/fatigue. Negative for fever and weight loss.  Respiratory: Negative.  Negative for cough, hemoptysis and shortness of breath.   Cardiovascular: Negative.  Negative for chest pain and leg swelling.  Gastrointestinal:  Positive for abdominal pain.  Genitourinary: Negative.  Negative for dysuria.  Musculoskeletal: Negative.  Negative for back pain.  Skin: Negative.  Negative for rash.  Neurological:  Positive for weakness. Negative for tingling, sensory change, focal weakness and headaches.  Psychiatric/Behavioral: Negative.  The patient is not nervous/anxious.    As per HPI. Otherwise, a complete review of systems is negative.  PAST MEDICAL HISTORY: Past Medical History:  Diagnosis Date   Cancer ([place])    Dermoid tumor    hx of--left ovary   Gastric bypass status for obesity 2004   s/p roux-en-y   Generalized headaches     frequent   Heart murmur    History of cardiac murmur    Intraductal papilloma of breast, left 06/18/2017   05/2017 - s/p excision 07/2017   Osteoporosis 03/2016   DEXA -2.6 spine   Salivary gland stone    x 2   Smoker     PAST SURGICAL HISTORY: Past Surgical History:  Procedure Laterality Date   ABDOMINAL HYSTERECTOMY     only ovary was removed   ANTERIOR CERVICAL DECOMP/DISCECTOMY FUSION  12/2016   [place]  ~1992   for dermoid ovarian cysts   BREAST BIOPSY Left 06/13/2017   papilloma with excision 07/2017   BREAST LUMPECTOMY WITH NEEDLE LOCALIZATION Left 08/10/2017   intraductal papilloma - Pabon, [place], MD   CHOLECYSTECTOMY  2000s   COLONOSCOPY WITH PROPOFOL N/A 11/27/2015   TA rpt 5 yrs Lucilla Lame, MD)   GASTRIC BYPASS  2004   roux en y [facility])   POLYPECTOMY  11/27/2015   Procedure: POLYPECTOMY;  Surgeon: Lucilla Lame, MD;  Location: [place];  Service: Endoscopy;;   REDUCTION MAMMAPLASTY Bilateral 60's   SALIVARY GLAND SURGERY     SALIVARY STONE REMOVAL      FAMILY HISTORY: Family History  Problem Relation Age of Onset   Cancer Father 33       leukemia   Hypertension Mother    Diabetes Neg Hx    Stroke Neg Hx    Coronary artery disease Neg Hx    Breast cancer Neg Hx     ADVANCED DIRECTIVES (Y/N):  N  HEALTH MAINTENANCE: Social History   Tobacco Use   Smoking  status: Former    Packs/day: 0.25    Years: 20.00    Pack years: 5.00    Types: Cigarettes    Quit date: 10/31/2012    Years since quitting: 8.3   Smokeless tobacco: Never  Vaping Use   Vaping Use: Never used  Substance Use Topics   Alcohol use: Not Currently   Drug use: No     Colonoscopy:  PAP:  Bone density:  Lipid panel:  Allergies  Allergen Reactions   Boniva [Ibandronic Acid] Other (See Comments)    Diffuse body aches    Current Outpatient Medications  Medication Sig Dispense Refill   calcium carbonate (OS-CAL) 600  MG TABS tablet Take 600 mg by mouth daily with breakfast.     Cholecalciferol (VITAMIN D3) 1000 units CAPS Take 3 capsules (3,000 Units total) by mouth daily.     denosumab (PROLIA) 60 MG/ML SOSY injection INJECT 60MG  UNDER THE SKIN EVERY 6 MONTHS 1 mL 0   ferrous sulfate 325 (65 FE) MG tablet Take 1 tablet (325 mg total) by mouth every Monday, Wednesday, and Friday.     Magnesium 250 MG TABS Take 1 tablet (250 mg total) by mouth daily.  0   methocarbamol (ROBAXIN) 500 MG tablet Take 1 tablet (500 mg total) by mouth 3 (three) times daily as needed for muscle spasms (sedation precautions). 40 tablet 1   vitamin B-12 (CYANOCOBALAMIN) 1000 MCG tablet Take 1 tablet (1,000 mcg total) by mouth daily.     zinc gluconate 50 MG tablet Take 1 tablet (50 mg total) by mouth daily.     No current facility-administered medications for this visit.    OBJECTIVE: There were no vitals filed for this visit.    There is no height or weight on file to calculate BMI.    ECOG FS:0 - Asymptomatic  General: Well-developed, well-nourished, no acute distress. Eyes: Pink conjunctiva, anicteric sclera. HEENT: Normocephalic, moist mucous membranes. Lungs: No audible wheezing or coughing. Heart: Regular rate and rhythm. Abdomen: Soft, nontender, no obvious distention. Musculoskeletal: No edema, cyanosis, or clubbing. Neuro: Alert, answering all questions appropriately. Cranial nerves grossly intact. Skin: No rashes or petechiae noted. Psych: Normal affect.   LAB RESULTS:  Lab Results  Component Value Date   NA 134 (L) 03/17/2021   K 4.9 03/17/2021   CL 102 03/17/2021   CO2 27 03/17/2021   GLUCOSE 88 03/17/2021   BUN 17 03/17/2021   CREATININE 0.52 03/17/2021   CALCIUM 8.8 (L) 03/17/2021   PROT 7.1 03/17/2021   ALBUMIN 4.3 03/17/2021   AST 39 03/17/2021   ALT 72 (H) 03/17/2021   ALKPHOS 52 03/17/2021   BILITOT 0.5 03/17/2021   GFRNONAA >60 03/17/2021   GFRAA >60 07/10/2013    Lab Results   Component Value Date   WBC 6.3 03/17/2021   NEUTROABS 3.6 03/17/2021   HGB 15.6 (H) 03/17/2021   HCT 47.7 (H) 03/17/2021   MCV 94.5 03/17/2021   PLT 194 03/17/2021     STUDIES: CT Abdomen Pelvis W Contrast  Result Date: 03/15/2021 CLINICAL DATA:  Follow-up lymphoma. EXAM: CT ABDOMEN AND PELVIS WITH CONTRAST TECHNIQUE: Multidetector CT imaging of the abdomen and pelvis was performed using the standard protocol following bolus administration of intravenous contrast. RADIATION DOSE REDUCTION: This exam was performed according to the departmental dose-optimization program which includes automated exposure control, adjustment of the mA and/or kV according to patient size and/or use of iterative reconstruction technique. CONTRAST:  155mL OMNIPAQUE IOHEXOL 300 MG/ML  SOLN  COMPARISON:  Multiple priors including most recent CT November 27, 2020 FINDINGS: Lower chest: No acute abnormality. Hepatobiliary: Tiny hypodense hepatic lesions are stable from prior examinations and statistically likely to reflect benign cysts or hemangiomas. Gallbladder surgically absent. Mild prominence of the biliary tree is favored reservoir effect post cholecystectomy. Pancreas: No pancreatic ductal dilation or evidence of acute inflammation. Spleen: Normal in size without focal abnormality. Adrenals/Urinary Tract: Bilateral adrenal glands appear normal. No hydronephrosis. Kidneys demonstrate symmetric enhancement excretion of contrast material. No solid enhancing renal mass. Urinary bladder is unremarkable for degree of distension. Stomach/Bowel: Radiopaque enteric contrast material traverses the cecum. Prior Roux-en-Y gastric bypass. No pathologic dilation of large or small bowel. Terminal ileum appears normal. The appendix is not confidently identified however there is no pericecal inflammation. Moderate volume of formed stool throughout the colon suggestive of constipation. No evidence of acute bowel inflammation.  Vascular/Lymphatic: Normal caliber abdominal aorta. Interval increase in size of the right retroperitoneal soft tissue mass which extends along the crus of the diaphragm. The mass effaces/narrows the IVC and left renal vein now measuring 4.1 x 2.8 cm on image 34/2 previously 2.6 x 1.8 cm. No new sites of abdominopelvic adenopathy. Reproductive: Uterus and bilateral adnexa are unremarkable. Other: No significant abdominopelvic free fluid. No pneumoperitoneum. Postsurgical change in the anterior abdominal wall. Calcified granulomata in the posterior left gluteal subcutaneous soft tissues. Musculoskeletal: L3 vertebral body hemangioma. Multilevel degenerative changes spine. IMPRESSION: 1. Interval increase in size of the right retroperitoneal soft tissue mass which now effaces/narrows the IVC and left renal vein and measures 4.1 x 2.8 cm previously 2.6 x 1.8 cm. 2. No new sites of abdominopelvic adenopathy and no splenomegaly. 3. Moderate volume of formed stool throughout the colon suggestive of constipation. 4. Prior Roux-en-Y gastric bypass without evidence of bowel obstruction. These results will be called to the ordering clinician or representative by the Radiologist Assistant, and communication documented in the PACS or [employer]. Electronically Signed   By: Dahlia Bailiff M.D.   On: 03/15/2021 12:38     ASSESSMENT: At least stage III follicular lymphoma, grade 1/2.    PLAN:    1.  At least stage III follicular lymphoma, grade 1/2: Retroperitoneal lymph node biopsy on April 21, 2020 confirmed the diagnosis. PET scan on April 14, 2020 revealed a hypermetabolic right sided retroperitoneal nodal mass along with hypermetabolic mesenteric adenopathy.  Patient also noted to have bilateral axillary adenopathy.  Although patient was asymptomatic and active surveillance was offered, she elected to pursue treatment and received IV Rituxan weekly x4 completing on May 28, 2020.  Repeat PET scan from August 24, 2020 reviewed independently with significant treatment response, although likely residual disease.  Patient previously declined maintenance Rituxan.  Repeat imaging on March 15, 2021 reviewed independently and report as above with progression of disease.  Patient is also symptomatic.  She has agreed to reinitiate weekly Rituxan x4 followed by maintenance Rituxan.  Can also consider Zevalin in the near future.  Return to clinic in 1 week to initiate cycle 1 of weekly Rituxan.   2.  Hypermetabolic thyroid nodule: Previously, biopsy was negative for malignancy. 3.  Elevated liver enzymes: Resolved. 4.  Polycythemia: Mild, monitor. 5.  Leukopenia: Resolved.   Patient expressed understanding and was in agreement with this plan. She also understands that She can call clinic at any time with any questions, concerns, or complaints.    Cancer Staging  Follicular lymphoma ([place]) Staging form: Hodgkin and Non-Hodgkin Lymphoma, AJCC  8th Edition - Clinical stage from 09/09/1749: Stage III (Follicular lymphoma) - Signed by [name], MD on 04/29/2020 Stage prefix: Initial diagnosis   [name], MD   03/22/2021 8:33 AM

## 2021-03-23 ENCOUNTER — Other Ambulatory Visit: Payer: Self-pay | Admitting: Family Medicine

## 2021-03-23 ENCOUNTER — Telehealth: Payer: Self-pay

## 2021-03-23 DIAGNOSIS — M810 Age-related osteoporosis without current pathological fracture: Secondary | ICD-10-CM

## 2021-03-23 NOTE — Telephone Encounter (Signed)
Called and spoke with Patient and she understands that we have to hold off until Authorization is approved.

## 2021-03-24 ENCOUNTER — Inpatient Hospital Stay: Payer: BC Managed Care – PPO | Admitting: Oncology

## 2021-03-24 ENCOUNTER — Inpatient Hospital Stay: Payer: BC Managed Care – PPO

## 2021-03-24 DIAGNOSIS — C8218 Follicular lymphoma grade II, lymph nodes of multiple sites: Secondary | ICD-10-CM

## 2021-03-25 ENCOUNTER — Telehealth: Payer: Self-pay | Admitting: *Deleted

## 2021-03-25 NOTE — Telephone Encounter (Addendum)
Received incoming fmla&STD papers for patient. Patient initially requested fmla continuous leave for 04/05/21-05/21/21. I sent patient a mychart msg to clarify her start dates. On 03/17/21 - Patient had reported "lethargy/fatigue"-pt is symptomatic with her lymphoma. I reached out to patient to see if she wanted to start her cont. Leave early that said date. Her treatment is not scheduled until 03/31/2021.  I attempted to reach patient via telephone and her daughter. No answer.

## 2021-03-26 NOTE — Progress Notes (Signed)
[place]  [phone] Fax:(336) [phone]  ID: Melinda Hall OB: 09-02-1960  MR#: [id number]  DGL#:[id number]  Patient Care Team: [name], MD as PCP - General (Family Medicine) [name], MD as Consulting Physician (Oncology) [name], MD as Consulting Physician (Neurosurgery)  CHIEF COMPLAINT: Recurrent stage III follicular lymphoma, grade 1/2.    INTERVAL HISTORY: Patient returns to clinic today for further evaluation and initiation of cycle 1 of 4 of weekly Rituxan.  She continues to have increased weakness and fatigue and night sweats, but otherwise feels well.  She denies any fevers. She also has occasional mid abdominal discomfort.  She has no neurologic complaints. She has no chest pain, shortness of breath, cough, or hemoptysis.  She denies any nausea, vomiting, constipation, or diarrhea.  She has no urinary complaints.  Patient offers no further specific complaints today.  REVIEW OF SYSTEMS:   Review of Systems  Constitutional:  Positive for diaphoresis and malaise/fatigue. Negative for fever and weight loss.  Respiratory: Negative.  Negative for cough, hemoptysis and shortness of breath.   Cardiovascular: Negative.  Negative for chest pain and leg swelling.  Gastrointestinal:  Positive for abdominal pain.  Genitourinary: Negative.  Negative for dysuria.  Musculoskeletal: Negative.  Negative for back pain.  Skin: Negative.  Negative for rash.  Neurological:  Positive for weakness. Negative for tingling, sensory change, focal weakness and headaches.  Psychiatric/Behavioral: Negative.  The patient is not nervous/anxious.    As per HPI. Otherwise, a complete review of systems is negative.  PAST MEDICAL HISTORY: Past Medical History:  Diagnosis Date   Cancer ([place])    Dermoid tumor    hx of--left ovary   Gastric bypass status for obesity 2004   s/p roux-en-y   Generalized headaches    frequent   Heart murmur     History of cardiac murmur    Intraductal papilloma of breast, left 06/18/2017   05/2017 - s/p excision 07/2017   Osteoporosis 03/2016   DEXA -2.6 spine   Salivary gland stone    x 2   Smoker     PAST SURGICAL HISTORY: Past Surgical History:  Procedure Laterality Date   ABDOMINAL HYSTERECTOMY     only ovary was removed   ANTERIOR CERVICAL DECOMP/DISCECTOMY FUSION  12/2016   [place]  ~1992   for dermoid ovarian cysts   BREAST BIOPSY Left 06/13/2017   papilloma with excision 07/2017   BREAST LUMPECTOMY WITH NEEDLE LOCALIZATION Left 08/10/2017   intraductal papilloma - [place], [place], MD   CHOLECYSTECTOMY  2000s   COLONOSCOPY WITH PROPOFOL N/A 11/27/2015   TA rpt 5 yrs Lucilla Lame, MD)   GASTRIC BYPASS  2004   roux en y [facility])   POLYPECTOMY  11/27/2015   Procedure: POLYPECTOMY;  Surgeon: Lucilla Lame, MD;  Location: [facility];  Service: Endoscopy;;   REDUCTION MAMMAPLASTY Bilateral 73's   SALIVARY GLAND SURGERY     SALIVARY STONE REMOVAL      FAMILY HISTORY: Family History  Problem Relation Age of Onset   Cancer Father 69       leukemia   Hypertension Mother    Diabetes Neg Hx    Stroke Neg Hx    Coronary artery disease Neg Hx    Breast cancer Neg Hx     ADVANCED DIRECTIVES (Y/N):  N  HEALTH MAINTENANCE: Social History   Tobacco Use   Smoking status: Former    Packs/day:  0.25    Years: 20.00    Pack years: 5.00    Types: Cigarettes    Quit date: 10/31/2012    Years since quitting: 8.4   Smokeless tobacco: Never  Vaping Use   Vaping Use: Never used  Substance Use Topics   Alcohol use: Not Currently   Drug use: No     Colonoscopy:  PAP:  Bone density:  Lipid panel:  Allergies  Allergen Reactions   Boniva [Ibandronic Acid] Other (See Comments)    Diffuse body aches    Current Outpatient Medications  Medication Sig Dispense Refill   calcium carbonate (OS-CAL) 600 MG TABS tablet Take 600 mg by  mouth daily with breakfast.     Cholecalciferol (VITAMIN D3) 1000 units CAPS Take 3 capsules (3,000 Units total) by mouth daily.     ferrous sulfate 325 (65 FE) MG tablet Take 1 tablet (325 mg total) by mouth every Monday, Wednesday, and Friday.     Magnesium 250 MG TABS Take 1 tablet (250 mg total) by mouth daily.  0   methocarbamol (ROBAXIN) 500 MG tablet Take 1 tablet (500 mg total) by mouth 3 (three) times daily as needed for muscle spasms (sedation precautions). 40 tablet 1   PROLIA 60 MG/ML SOSY injection INJECT 60MG  UNDER THE SKIN EVERY 6 MONTHS 1 mL 0   vitamin B-12 (CYANOCOBALAMIN) 1000 MCG tablet Take 1 tablet (1,000 mcg total) by mouth daily.     zinc gluconate 50 MG tablet Take 1 tablet (50 mg total) by mouth daily.     No current facility-administered medications for this visit.    OBJECTIVE: Vitals:   03/31/21 0835  BP: 138/90  Pulse: (!) 52  Resp: 16  Temp: (!) 97.3 F (36.3 C)  SpO2: 100%     Body mass index is 26.88 kg/m.    ECOG FS:0 - Asymptomatic  General: Well-developed, well-nourished, no acute distress. Eyes: Pink conjunctiva, anicteric sclera. HEENT: Normocephalic, moist mucous membranes. Lungs: No audible wheezing or coughing. Heart: Regular rate and rhythm. Abdomen: Soft, nontender, no obvious distention. Musculoskeletal: No edema, cyanosis, or clubbing. Neuro: Alert, answering all questions appropriately. Cranial nerves grossly intact. Skin: No rashes or petechiae noted. Psych: Normal affect.  LAB RESULTS:  Lab Results  Component Value Date   NA 136 03/31/2021   K 5.3 (H) 03/31/2021   CL 103 03/31/2021   CO2 28 03/31/2021   GLUCOSE 95 03/31/2021   BUN 20 03/31/2021   CREATININE 0.63 03/31/2021   CALCIUM 9.3 03/31/2021   PROT 7.1 03/31/2021   ALBUMIN 4.4 03/31/2021   AST 22 03/31/2021   ALT 31 03/31/2021   ALKPHOS 51 03/31/2021   BILITOT 0.9 03/31/2021   GFRNONAA >60 03/31/2021   GFRAA >60 07/10/2013    Lab Results  Component Value  Date   WBC 5.8 03/31/2021   NEUTROABS 3.2 03/31/2021   HGB 15.6 (H) 03/31/2021   HCT 47.6 (H) 03/31/2021   MCV 94.3 03/31/2021   PLT 194 03/31/2021     STUDIES: CT Abdomen Pelvis W Contrast  Result Date: 03/15/2021 CLINICAL DATA:  Follow-up lymphoma. EXAM: CT ABDOMEN AND PELVIS WITH CONTRAST TECHNIQUE: Multidetector CT imaging of the abdomen and pelvis was performed using the standard protocol following bolus administration of intravenous contrast. RADIATION DOSE REDUCTION: This exam was performed according to the departmental dose-optimization program which includes automated exposure control, adjustment of the mA and/or kV according to patient size and/or use of iterative reconstruction technique. CONTRAST:  119mL OMNIPAQUE IOHEXOL 300  MG/ML  SOLN COMPARISON:  Multiple priors including most recent CT November 27, 2020 FINDINGS: Lower chest: No acute abnormality. Hepatobiliary: Tiny hypodense hepatic lesions are stable from prior examinations and statistically likely to reflect benign cysts or hemangiomas. Gallbladder surgically absent. Mild prominence of the biliary tree is favored reservoir effect post cholecystectomy. Pancreas: No pancreatic ductal dilation or evidence of acute inflammation. Spleen: Normal in size without focal abnormality. Adrenals/Urinary Tract: Bilateral adrenal glands appear normal. No hydronephrosis. Kidneys demonstrate symmetric enhancement excretion of contrast material. No solid enhancing renal mass. Urinary bladder is unremarkable for degree of distension. Stomach/Bowel: Radiopaque enteric contrast material traverses the cecum. Prior Roux-en-Y gastric bypass. No pathologic dilation of large or small bowel. Terminal ileum appears normal. The appendix is not confidently identified however there is no pericecal inflammation. Moderate volume of formed stool throughout the colon suggestive of constipation. No evidence of acute bowel inflammation. Vascular/Lymphatic: Normal  caliber abdominal aorta. Interval increase in size of the right retroperitoneal soft tissue mass which extends along the crus of the diaphragm. The mass effaces/narrows the IVC and left renal vein now measuring 4.1 x 2.8 cm on image 34/2 previously 2.6 x 1.8 cm. No new sites of abdominopelvic adenopathy. Reproductive: Uterus and bilateral adnexa are unremarkable. Other: No significant abdominopelvic free fluid. No pneumoperitoneum. Postsurgical change in the anterior abdominal wall. Calcified granulomata in the posterior left gluteal subcutaneous soft tissues. Musculoskeletal: L3 vertebral body hemangioma. Multilevel degenerative changes spine. IMPRESSION: 1. Interval increase in size of the right retroperitoneal soft tissue mass which now effaces/narrows the IVC and left renal vein and measures 4.1 x 2.8 cm previously 2.6 x 1.8 cm. 2. No new sites of abdominopelvic adenopathy and no splenomegaly. 3. Moderate volume of formed stool throughout the colon suggestive of constipation. 4. Prior Roux-en-Y gastric bypass without evidence of bowel obstruction. These results will be called to the ordering clinician or representative by the Radiologist Assistant, and communication documented in the PACS or [employer]. Electronically Signed   By: Dahlia Bailiff M.D.   On: 03/15/2021 12:38    ONCOLOGY HISTORY: Retroperitoneal lymph node biopsy on April 21, 2020 confirmed the diagnosis. PET scan on April 14, 2020 revealed a hypermetabolic right sided retroperitoneal nodal mass along with hypermetabolic mesenteric adenopathy.  Patient also noted to have bilateral axillary adenopathy.  Although patient was asymptomatic and active surveillance was offered, she elected to pursue treatment and received IV Rituxan weekly x4 completing on May 28, 2020.  Repeat PET scan from August 24, 2020 reviewed independently with significant treatment response, although likely residual disease.  Patient previously declined maintenance  Rituxan.   ASSESSMENT: Recurrent stage III follicular lymphoma, grade 1/2.    PLAN:    1.  Recurrent stage III follicular lymphoma, grade 1/2:  Repeat imaging on March 15, 2021 reviewed independently with progression of disease.  Patient is also symptomatic.  She has agreed to reinitiate weekly Rituxan x4 followed by maintenance Rituxan.  Can also consider Zevalin in the near future.  Proceed with cycle 1 of 4 of weekly Rituxan.  Return to clinic in 1 week for further evaluation and consideration of cycle 2.  2.  Hypermetabolic thyroid nodule: Previously, biopsy was negative for malignancy. 3.  Elevated liver enzymes: Resolved. 4.  Polycythemia: Mild, monitor. 5.  Leukopenia: Resolved.  I spent a total of 30 minutes reviewing chart data, face-to-face evaluation with the patient, counseling and coordination of care as detailed above.    Patient expressed understanding and was in agreement with  this plan. She also understands that She can call clinic at any time with any questions, concerns, or complaints.    Cancer Staging  Follicular lymphoma ([place]) Staging form: Hodgkin and Non-Hodgkin Lymphoma, AJCC 8th Edition - Clinical stage from 8/65/7846: Stage III (Follicular lymphoma) - Signed by [name], MD on 04/29/2020 Stage prefix: Initial diagnosis   [name], MD   03/31/2021 12:48 PM

## 2021-03-29 ENCOUNTER — Encounter: Payer: Self-pay | Admitting: Oncology

## 2021-03-29 NOTE — Telephone Encounter (Signed)
Forms signed by Dr. Grayland Ormond. Fmla faxed- 03/29/21. Mychart msg sent to patient.

## 2021-03-30 ENCOUNTER — Telehealth: Payer: Self-pay

## 2021-03-30 ENCOUNTER — Encounter: Payer: Self-pay | Admitting: Oncology

## 2021-03-30 NOTE — Telephone Encounter (Signed)
Benefits submitted and received. Patient uses copay card and gets medication from [place]. Refill was already sent over and I need to follow up with the pharmacy on delivery for this medication. Next injection due 04/14/21 or after PA approved 03/08/21-03/08/22

## 2021-03-31 ENCOUNTER — Inpatient Hospital Stay: Payer: BC Managed Care – PPO

## 2021-03-31 ENCOUNTER — Inpatient Hospital Stay: Payer: BC Managed Care – PPO | Attending: Oncology | Admitting: Oncology

## 2021-03-31 ENCOUNTER — Other Ambulatory Visit: Payer: Self-pay

## 2021-03-31 ENCOUNTER — Other Ambulatory Visit: Payer: Self-pay | Admitting: *Deleted

## 2021-03-31 ENCOUNTER — Other Ambulatory Visit: Payer: Self-pay | Admitting: Family Medicine

## 2021-03-31 VITALS — BP 106/69 | HR 66 | Temp 97.9°F | Resp 18

## 2021-03-31 VITALS — BP 138/90 | HR 52 | Temp 97.3°F | Resp 16 | Ht 65.0 in | Wt 161.5 lb

## 2021-03-31 DIAGNOSIS — Z79899 Other long term (current) drug therapy: Secondary | ICD-10-CM | POA: Insufficient documentation

## 2021-03-31 DIAGNOSIS — C8218 Follicular lymphoma grade II, lymph nodes of multiple sites: Secondary | ICD-10-CM | POA: Diagnosis not present

## 2021-03-31 DIAGNOSIS — Z5112 Encounter for antineoplastic immunotherapy: Secondary | ICD-10-CM | POA: Diagnosis not present

## 2021-03-31 DIAGNOSIS — Z1231 Encounter for screening mammogram for malignant neoplasm of breast: Secondary | ICD-10-CM

## 2021-03-31 DIAGNOSIS — C8220 Follicular lymphoma grade III, unspecified, unspecified site: Secondary | ICD-10-CM | POA: Diagnosis not present

## 2021-03-31 LAB — CBC WITH DIFFERENTIAL/PLATELET
Abs Immature Granulocytes: 0.01 10*3/uL (ref 0.00–0.07)
Basophils Absolute: 0 10*3/uL (ref 0.0–0.1)
Basophils Relative: 1 %
Eosinophils Absolute: 0.1 10*3/uL (ref 0.0–0.5)
Eosinophils Relative: 2 %
HCT: 47.6 % — ABNORMAL HIGH (ref 36.0–46.0)
Hemoglobin: 15.6 g/dL — ABNORMAL HIGH (ref 12.0–15.0)
Immature Granulocytes: 0 %
Lymphocytes Relative: 31 %
Lymphs Abs: 1.8 10*3/uL (ref 0.7–4.0)
MCH: 30.9 pg (ref 26.0–34.0)
MCHC: 32.8 g/dL (ref 30.0–36.0)
MCV: 94.3 fL (ref 80.0–100.0)
Monocytes Absolute: 0.6 10*3/uL (ref 0.1–1.0)
Monocytes Relative: 11 %
Neutro Abs: 3.2 10*3/uL (ref 1.7–7.7)
Neutrophils Relative %: 55 %
Platelets: 194 10*3/uL (ref 150–400)
RBC: 5.05 MIL/uL (ref 3.87–5.11)
RDW: 13.3 % (ref 11.5–15.5)
WBC: 5.8 10*3/uL (ref 4.0–10.5)
nRBC: 0 % (ref 0.0–0.2)

## 2021-03-31 LAB — COMPREHENSIVE METABOLIC PANEL
ALT: 31 U/L (ref 0–44)
AST: 22 U/L (ref 15–41)
Albumin: 4.4 g/dL (ref 3.5–5.0)
Alkaline Phosphatase: 51 U/L (ref 38–126)
Anion gap: 5 (ref 5–15)
BUN: 20 mg/dL (ref 8–23)
CO2: 28 mmol/L (ref 22–32)
Calcium: 9.3 mg/dL (ref 8.9–10.3)
Chloride: 103 mmol/L (ref 98–111)
Creatinine, Ser: 0.63 mg/dL (ref 0.44–1.00)
GFR, Estimated: >60 mL/min (ref >60)
Glucose, Bld: 95 mg/dL (ref 70–99)
Potassium: 5.3 mmol/L — ABNORMAL HIGH (ref 3.5–5.1)
Sodium: 136 mmol/L (ref 135–145)
Total Bilirubin: 0.9 mg/dL (ref 0.3–1.2)
Total Protein: 7.1 g/dL (ref 6.5–8.1)

## 2021-03-31 MED ORDER — DIPHENHYDRAMINE HCL 25 MG PO CAPS
25.0000 mg | ORAL_CAPSULE | Freq: Once | ORAL | Status: AC
  Administered 2021-03-31: 25 mg via ORAL
  Filled 2021-03-31: qty 1

## 2021-03-31 MED ORDER — ACETAMINOPHEN 325 MG PO TABS
650.0000 mg | ORAL_TABLET | Freq: Once | ORAL | Status: AC
  Administered 2021-03-31: 650 mg via ORAL
  Filled 2021-03-31: qty 2

## 2021-03-31 MED ORDER — SODIUM CHLORIDE 0.9 % IV SOLN
375.0000 mg/m2 | Freq: Once | INTRAVENOUS | Status: AC
  Administered 2021-03-31: 700 mg via INTRAVENOUS
  Filled 2021-03-31: qty 50

## 2021-03-31 MED ORDER — SODIUM CHLORIDE 0.9 % IV SOLN
Freq: Once | INTRAVENOUS | Status: AC
  Filled 2021-03-31: qty 250

## 2021-03-31 NOTE — Patient Instructions (Addendum)
Rituximab Injection Qu es este medicamento? El RITUXIMAB es un anticuerpo monoclonal. Se utiliza para tratar ciertos tipos de cncer, tales como el linfoma no Hodgkin y la leucemia linfoctica crnica. Tambin se [place] para tratar la artritis reumatoide, la poliangitis granulomatosa, la poliangitis microscpica y el pnfigo vulgar. Este medicamento puede ser utilizado para otros usos; si tiene alguna pregunta consulte con su proveedor de atencin mdica o con su farmacutico. MARCAS COMUNES: RIABNI, Rituxan, RUXIENCE Qu le debo informar a mi profesional de la salud antes de tomar este medicamento? Necesitan saber si usted presenta alguno de los siguientes problemas o situaciones: dolor en el pecho enfermedad cardiaca infeccin, especialmente una infeccin viral, tales como varicela, fuegos labiales, hepatitis B o herpes problemas del sistema inmunolgico ritmo o frecuencia cardiaca irregular enfermedad renal recuentos sanguneos bajos (glbulos blancos, plaquetas o glbulos rojos) enfermedad pulmonar vacuna reciente o programada una reaccin alrgica o inusual al rituximab, a otros medicamentos, alimentos, colorantes o conservantes si est embarazada o buscando quedar embarazada si est amamantando a un beb Cmo debo utilizar este medicamento? Este medicamento se inyecta en una vena. Lo administra un proveedor de Geophysical data processor en un hospital o en un entorno clnico. Se le entregar una Gua del medicamento (MedGuide, su nombre en ingls) especial antes de cada tratamiento. Asegrese de leer esta informacin cada vez cuidadosamente. Hable con su proveedor de atencin mdica sobre el uso de este medicamento en nios. Aunque este medicamento se puede recetar a nios tan pequeos como de 6 meses de edad con ciertas afecciones, existen precauciones que deben tomarse. Sobredosis: Pngase en contacto inmediatamente con un centro toxicolgico o una sala de urgencia si usted cree que haya  tomado demasiado medicamento. ATENCIN: [employer] es solo para usted. No comparta este medicamento con nadie. Qu sucede si me olvido de una dosis? Cumpla con las citas para dosis de seguimiento. Es importante no olvidar ninguna dosis. Llame a su proveedor de atencin mdica si no puede asistir a una cita. Qu puede interactuar con este medicamento? No use esta medicina con ninguno de los siguientes medicamentos: vacunas vivas Esta medicina tambin puede interactuar con los siguientes medicamentos: cisplatino Puede ser que esta lista no menciona todas las posibles interacciones. Informe a su profesional de [employer]	[place] de [employer] productos a base de hierbas, medicamentos de [place] o suplementos nutritivos que est tomando. Si usted fuma, consume bebidas alcohlicas o si utiliza drogas ilegales, indqueselo tambin a su profesional de [employer]	[place]. Algunas sustancias pueden interactuar con su medicamento. A qu debo estar atento al usar Coca[employer]Cola? Se supervisar su estado de salud atentamente mientras reciba este medicamento. Usted podra necesitar realizarse [employer] de sangre mientras est usando [place]. Este medicamento puede causar reacciones graves a la infusin. Para reducir Catering manager, su proveedor de atencin mdica podra darle otros medicamentos que deber usar antes de Health and safety inspector. Asegrese de seguir las instrucciones de su proveedor de Geophysical data processor. Este medicamento puede aumentar su riesgo de contraer una infeccin. Consulte con su proveedor de atencin mdica si tiene fiebre, escalofros, dolor de garganta, o cualquier otro sntoma de resfro o gripe. No se trate usted mismo. Trate de no acercarse a personas que estn enfermas. Llame a su proveedor de atencin mdica si est expuesto a cualquier persona con sarampin o varicela, o si desarrolla llagas o ampollas que no sanan correctamente. Evite usar [employer] contienen aspirina, acetaminofeno,  ibuprofeno, naproxeno o ketoprofeno, a menos que as lo indique su proveedor de Geophysical data processor. Estos productos  pueden ocultar la fiebre. Este medicamento puede causar reacciones graves en la piel. Pueden suceder semanas a meses despus de comenzar a Environmental consultant. Contacte a su proveedor de atencin mdica de inmediato si nota que tiene fiebre o sntomas gripales con una erupcin. La erupcin puede ser roja o Phill Myron, y luego puede convertirse en ampollas o descamacin de la piel. O bien, es posible que observe una erupcin roja con hinchazn en la cara, los labios o los ganglios linfticos en el cuello o debajo de los brazos. En algunos pacientes, este medicamento puede causar una infeccin grave en el cerebro que puede llevar a la muerte. Si tiene cualquier problema para ver, pensar, hablar, caminar o pararse, infrmeselo a su profesional de [employer]	[place] de inmediato. Si no puede ponerse en contacto con su profesional de la salud, busque otra fuente de atencin mdica de [place] urgente. No debe quedar [employer] est [employer], o al menos por 12 meses despus de dejar de usarlo. Las mujeres deben informar a su proveedor de atencin mdica si estn buscando quedar embarazadas o si creen que podran estar embarazadas. Existe la posibilidad de daos graves en un beb sin nacer. Para obtener ms informacin, hable con su proveedor de atencin mdica. Las [employer] deben usar un mtodo anticonceptivo confiable mientras estn recibiendo Coca[employer]Cola y durante 12 meses despus de dejar de usarlo. No debe amamantar a un beb mientras est tomando este medicamento, o al menos por 6 meses despus de dejar de usarlo. Qu efectos secundarios puedo tener al [employer] este medicamento? Efectos secundarios que debe informar a su proveedor de atencin mdica tan pronto como sea posible: Chief of Staff (erupcin cutnea, comezn/picazn o urticaria; hinchazn de la cara, los labios o la  lengua) diarrea edema (aumento repentino de peso; hinchazn de los tobillos, los pies, las manos u otra hinchazn inusual; dificultad para Ambulance person) frecuencia cardiaca rpida e irregular ataque cardiaco (problemas para respirar; dolor u opresin en el pecho, cuello, espalda o brazos; debilidad o cansancio inusuales) infeccin (fiebre, escalofros, tos, dolor de garganta, Social research officer, government o dificultad para Garment/textile technologist) lesin renal (dificultad para orinar o cambios en la cantidad de orina) lesin en el hgado (orina amarilla oscura o [place]; sensacin general de estar enfermo o sntomas gripales; prdida del apetito; dolor en la regin abdominal superior derecha; debilidad o cansancio inusuales; color amarillento de los ojos o la piel) presin arterial baja (mareos; sensacin de desmayo o aturdimiento; cadas; debilidad o cansancio inusuales) recuentos bajos de glbulos rojos (dificultad para respirar; sensacin de desmayo; aturdimiento; cadas; debilidad o cansancio inusuales) llagas en la boca enrojecimiento, formacin de ampollas, descamacin o distensin de la piel, incluso dentro de la boca dolor estomacal sangrado o moretones inusuales sibilancias (dificultad para respirar acompaada de sonidos fuertes o silbidos) vmito Efectos secundarios que generalmente no requieren atencin mdica (debe informarlos a su proveedor de atencin mdica si persisten o si son molestos): dolor de Special educational needs teacher en las articulaciones calambres, dolores musculares nuseas Puede ser que esta lista no menciona todos los posibles efectos secundarios. Comunquese a su mdico por asesoramiento mdico [employer]. Usted puede informar los efectos secundarios a la FDA por telfono al 1-800-FDA-1088. Dnde debo guardar mi medicina? Este medicamento se administra en hospitales o clnicas. No se guarda en su casa. ATENCIN: Este folleto es un resumen. Puede ser que no cubra toda la posible informacin. Si usted  tiene preguntas acerca de esta medicina, consulte con su mdico, su farmacutico o su profesional de Technical sales engineer.  2022 Elsevier/Gold Standard (2020-08-19 00:00:00)   [facility] [place]   Discharge Instructions: Thank you for choosing [facility] to provide your oncology and hematology care.  If you have a lab appointment with the [facility], please go directly to the [facility] and check in at the registration area.  Wear comfortable clothing and clothing appropriate for easy access to any Portacath or PICC line.   We strive to give you quality time with your provider. You may need to reschedule your appointment if you arrive late (15 or more minutes).  Arriving late affects you and other patients whose appointments are after yours.  Also, if you miss three or more appointments without notifying the office, you may be dismissed from the clinic at the providers discretion.      For prescription refill requests, have your pharmacy contact our office and allow 72 hours for refills to be completed.    Today you received the following chemotherapy and/or immunotherapy agents: Rituxan      To help prevent nausea and vomiting after your treatment, we encourage you to take your nausea medication as directed.  BELOW ARE SYMPTOMS THAT SHOULD BE REPORTED IMMEDIATELY: *FEVER GREATER THAN 100.4 F (38 C) OR HIGHER *CHILLS OR SWEATING *NAUSEA AND VOMITING THAT IS NOT CONTROLLED WITH YOUR NAUSEA MEDICATION *UNUSUAL SHORTNESS OF BREATH *UNUSUAL BRUISING OR BLEEDING *URINARY PROBLEMS (pain or burning when urinating, or frequent urination) *BOWEL PROBLEMS (unusual diarrhea, constipation, pain near the anus) TENDERNESS IN MOUTH AND THROAT WITH OR WITHOUT PRESENCE OF ULCERS (sore throat, sores in mouth, or a toothache) UNUSUAL RASH, SWELLING OR PAIN  UNUSUAL VAGINAL DISCHARGE OR ITCHING   Items with * indicate a potential emergency and should be followed up  as soon as possible or go to the Emergency Department if any problems should occur.  Please show the CHEMOTHERAPY ALERT CARD or IMMUNOTHERAPY ALERT CARD at check-in to the Emergency Department and triage nurse.  Should you have questions after your visit or need to cancel or reschedule your appointment, please contact [facility]CER [place] [facility] [place]  [phone] and follow the prompts.  Office hours are 8:00 a.m. to 4:30 p.m. Monday - Friday. Please note that voicemails left after 4:00 p.m. may not be returned until the following business day.  We are closed weekends and major holidays. You have access to a nurse at all times for urgent questions. Please call the main number to the clinic [phone] and follow the prompts.  For any non-urgent questions, you may also contact your provider using MyChart. We now offer e-Visits for anyone 22 and older to request care online for non-urgent symptoms. For details visit [email or web address].   Also download the MyChart app! Go to the app store, search "MyChart", open the app, select [facility], and log in with your MyChart username and password.  Due to Covid, a mask is required upon entering the hospital/clinic. If you do not have a mask, one will be given to you upon arrival. For doctor visits, patients may have 1 support person aged 18 or older with them. For treatment visits, patients cannot have anyone with them due to current Covid guidelines and our immunocompromised population.

## 2021-03-31 NOTE — Telephone Encounter (Signed)
Spoke with [employer], [facility] was received and they have been trying to reach patient to schedule delivery. I called patient and gave her their number to call them back. Lab was done via oncologist office today 03/31/21 CrCl is 108.51 mL/min base of those results, Calcium normal at 9.3. ?Nurse visit scheduled 04/15/21 ?

## 2021-03-31 NOTE — Telephone Encounter (Signed)
Patient called to make an appt for a colonoscopy. Requesting call back. ?

## 2021-04-01 ENCOUNTER — Other Ambulatory Visit: Payer: Self-pay

## 2021-04-01 ENCOUNTER — Telehealth: Payer: Self-pay | Admitting: *Deleted

## 2021-04-01 DIAGNOSIS — Z1211 Encounter for screening for malignant neoplasm of colon: Secondary | ICD-10-CM

## 2021-04-01 MED ORDER — NA SULFATE-K SULFATE-MG SULF 17.5-3.13-1.6 GM/177ML PO SOLN: 1.0000 | Freq: Once | ORAL | 0 refills | Status: AC

## 2021-04-01 NOTE — Progress Notes (Signed)
Gastroenterology Pre-Procedure Review ? ?Request Date: 05/17/2021 ?Requesting Physician: Dr. Allen Norris ? ?PATIENT REVIEW QUESTIONS: The patient responded to the following health history questions as indicated:   ? ?1. Are you having any GI issues? no ?2. Do you have a personal history of Polyps? no ?3. Do you have a family history of Colon Cancer or Polyps? no ?4. Diabetes Mellitus? no ?5. Joint replacements in the past 12 months?no ?6. Major health problems in the past 3 months?treatment for nohodgekins ?7. Any artificial heart valves, MVP, or defibrillator?no ?   ?MEDICATIONS & ALLERGIES:    ?Patient reports the following regarding taking any anticoagulation/antiplatelet therapy:   ?Plavix, Coumadin, Eliquis, Xarelto, Lovenox, Pradaxa, Brilinta, or Effient? no ?Aspirin? no ? ?Patient confirms/reports the following medications:  ?Current Outpatient Medications  ?Medication Sig Dispense Refill  ? calcium carbonate (OS-CAL) 600 MG TABS tablet Take 600 mg by mouth daily with breakfast.    ? Cholecalciferol (VITAMIN D3) 1000 units CAPS Take 3 capsules (3,000 Units total) by mouth daily.    ? ferrous sulfate 325 (65 FE) MG tablet Take 1 tablet (325 mg total) by mouth every Monday, Wednesday, and Friday.    ? Magnesium 250 MG TABS Take 1 tablet (250 mg total) by mouth daily.  0  ? methocarbamol (ROBAXIN) 500 MG tablet Take 1 tablet (500 mg total) by mouth 3 (three) times daily as needed for muscle spasms (sedation precautions). 40 tablet 1  ? PROLIA 60 MG/ML SOSY injection INJECT 60MG  UNDER THE SKIN EVERY 6 MONTHS 1 mL 0  ? vitamin B-12 (CYANOCOBALAMIN) 1000 MCG tablet Take 1 tablet (1,000 mcg total) by mouth daily.    ? zinc gluconate 50 MG tablet Take 1 tablet (50 mg total) by mouth daily.    ? ?No current facility-administered medications for this visit.  ? ? ?Patient confirms/reports the following allergies:  ?Allergies  ?Allergen Reactions  ? Boniva [Ibandronic Acid] Other (See Comments)  ?  Diffuse body aches  ? ? ?No  orders of the defined types were placed in this encounter. ? ? ?AUTHORIZATION INFORMATION ?Primary Insurance: ?1D#: ?Group #: ? ?Secondary Insurance: ?1D#: ?Group #: ? ?SCHEDULE INFORMATION: ?Date: 05/17/2021 ?Time: ?Location:[facility] ? ?

## 2021-04-01 NOTE — Telephone Encounter (Signed)
Per pt's request. I refaxed her fmla to the benefits Dept at [facility]. Fax confirmation rcvd ?

## 2021-04-01 NOTE — Addendum Note (Signed)
Addended by: Eliseo Squires on: 04/01/2021 08:34 AM ? ? Modules accepted: Orders ? ?

## 2021-04-01 NOTE — Progress Notes (Signed)
[place]  [phone] [phone]  ID: Melinda Hall OB: 1960-07-09  MR#: [id number]  [facility]#:[id number]  Patient Care Team: [name], MD as PCP - General (Family Medicine) [name], MD as Consulting Physician (Oncology) [name], MD as Consulting Physician (Neurosurgery)  CHIEF COMPLAINT: Recurrent stage III follicular lymphoma, grade 1/2.    INTERVAL HISTORY: Patient returns to clinic today for further evaluation and consideration of cycle 2 of 4 of weekly Rituxan.  She has persistent weakness and fatigue, but states her night sweats have improved after her first infusion.  She denies any fevers.  She does not complaining of any abdominal discomfort today.  She has no neurologic complaints. She has no chest pain, shortness of breath, cough, or hemoptysis.  She denies any nausea, vomiting, constipation, or diarrhea.  She has no urinary complaints.  Patient offers no further specific complaints today.  REVIEW OF SYSTEMS:   Review of Systems  Constitutional:  Positive for diaphoresis and malaise/fatigue. Negative for fever and weight loss.  Respiratory: Negative.  Negative for cough, hemoptysis and shortness of breath.   Cardiovascular: Negative.  Negative for chest pain and leg swelling.  Gastrointestinal: Negative.  Negative for abdominal pain.  Genitourinary: Negative.  Negative for dysuria.  Musculoskeletal: Negative.  Negative for back pain.  Skin: Negative.  Negative for rash.  Neurological:  Positive for weakness. Negative for tingling, sensory change, focal weakness and headaches.  Psychiatric/Behavioral: Negative.  The patient is not nervous/anxious.    As per HPI. Otherwise, a complete review of systems is negative.  PAST MEDICAL HISTORY: Past Medical History:  Diagnosis Date   Cancer ([place])    Dermoid tumor    hx of--left ovary   Gastric bypass status for obesity 2004   s/p roux-en-y   Generalized headaches     frequent   Heart murmur    History of cardiac murmur    Intraductal papilloma of breast, left 06/18/2017   05/2017 - s/p excision 07/2017   Osteoporosis 03/2016   DEXA -2.6 spine   Salivary gland stone    x 2   Smoker     PAST SURGICAL HISTORY: Past Surgical History:  Procedure Laterality Date   ABDOMINAL HYSTERECTOMY     only ovary was removed   ANTERIOR CERVICAL DECOMP/DISCECTOMY FUSION  12/2016   [place]  ~1992   for dermoid ovarian cysts   BREAST BIOPSY Left 06/13/2017   papilloma with excision 07/2017   BREAST LUMPECTOMY WITH NEEDLE LOCALIZATION Left 08/10/2017   intraductal papilloma - Pabon, Kiln, MD   CHOLECYSTECTOMY  2000s   COLONOSCOPY WITH PROPOFOL N/A 11/27/2015   TA rpt 5 yrs Lucilla Lame, MD)   GASTRIC BYPASS  2004   roux en y [facility])   POLYPECTOMY  11/27/2015   Procedure: POLYPECTOMY;  Surgeon: Lucilla Lame, MD;  Location: [place];  Service: Endoscopy;;   REDUCTION MAMMAPLASTY Bilateral 59's   SALIVARY GLAND SURGERY     SALIVARY STONE REMOVAL      FAMILY HISTORY: Family History  Problem Relation Age of Onset   Cancer Father 40       leukemia   Hypertension Mother    Diabetes Neg Hx    Stroke Neg Hx    Coronary artery disease Neg Hx    Breast cancer Neg Hx     ADVANCED DIRECTIVES (Y/N):  N  HEALTH MAINTENANCE: Social History   Tobacco Use   Smoking  status: Former    Packs/day: 0.25    Years: 20.00    Pack years: 5.00    Types: Cigarettes    Quit date: 10/31/2012    Years since quitting: 8.4   Smokeless tobacco: Never  Vaping Use   Vaping Use: Never used  Substance Use Topics   Alcohol use: Not Currently   Drug use: No     Colonoscopy:  PAP:  Bone density:  Lipid panel:  Allergies  Allergen Reactions   Boniva [Ibandronic Acid] Other (See Comments)    Diffuse body aches    Current Outpatient Medications  Medication Sig Dispense Refill   calcium carbonate (OS-CAL)  600 MG TABS tablet Take 600 mg by mouth daily with breakfast.     Cholecalciferol (VITAMIN D3) 1000 units CAPS Take 3 capsules (3,000 Units total) by mouth daily.     ferrous sulfate 325 (65 FE) MG tablet Take 1 tablet (325 mg total) by mouth every Monday, Wednesday, and Friday.     Magnesium 250 MG TABS Take 1 tablet (250 mg total) by mouth daily.  0   methocarbamol (ROBAXIN) 500 MG tablet Take 1 tablet (500 mg total) by mouth 3 (three) times daily as needed for muscle spasms (sedation precautions). 40 tablet 1   PROLIA 60 MG/ML SOSY injection INJECT 60MG  UNDER THE SKIN EVERY 6 MONTHS 1 mL 0   vitamin B-12 (CYANOCOBALAMIN) 1000 MCG tablet Take 1 tablet (1,000 mcg total) by mouth daily.     zinc gluconate 50 MG tablet Take 1 tablet (50 mg total) by mouth daily.     No current facility-administered medications for this visit.   Facility-Administered Medications Ordered in Other Visits  Medication Dose Route Frequency Provider Last Rate Last Admin   riTUXimab-pvvr (RUXIENCE) 700 mg in sodium chloride 0.9 % 180 mL infusion  375 mg/m2 Intravenous Once [name], MD       sodium chloride flush (NS) 0.9 % injection 10 mL  10 mL Intracatheter PRN [name], MD        OBJECTIVE: Vitals:   04/07/21 0832  BP: (!) 137/95  Pulse: (!) 55  Temp: 97.8 F (36.6 C)     Body mass index is 26.79 kg/m.    ECOG FS:0 - Asymptomatic  General: Well-developed, well-nourished, no acute distress. Eyes: Pink conjunctiva, anicteric sclera. HEENT: Normocephalic, moist mucous membranes. Lungs: No audible wheezing or coughing. Heart: Regular rate and rhythm. Abdomen: Soft, nontender, no obvious distention. Musculoskeletal: No edema, cyanosis, or clubbing. Neuro: Alert, answering all questions appropriately. Cranial nerves grossly intact. Skin: No rashes or petechiae noted. Psych: Normal affect.  LAB RESULTS:  Lab Results  Component Value Date   NA 137 04/07/2021   K 5.1 04/07/2021    CL 104 04/07/2021   CO2 29 04/07/2021   GLUCOSE 97 04/07/2021   BUN 18 04/07/2021   CREATININE 0.48 04/07/2021   CALCIUM 9.2 04/07/2021   PROT 6.9 04/07/2021   ALBUMIN 4.3 04/07/2021   AST 29 04/07/2021   ALT 36 04/07/2021   ALKPHOS 49 04/07/2021   BILITOT 1.1 04/07/2021   GFRNONAA >60 04/07/2021   GFRAA >60 07/10/2013    Lab Results  Component Value Date   WBC 5.7 04/07/2021   NEUTROABS 2.9 04/07/2021   HGB 15.9 (H) 04/07/2021   HCT 47.4 (H) 04/07/2021   MCV 92.9 04/07/2021   PLT 200 04/07/2021     STUDIES: CT Abdomen Pelvis W Contrast  Result Date: 03/15/2021 CLINICAL DATA:  Follow-up lymphoma.  EXAM: CT ABDOMEN AND PELVIS WITH CONTRAST TECHNIQUE: Multidetector CT imaging of the abdomen and pelvis was performed using the standard protocol following bolus administration of intravenous contrast. RADIATION DOSE REDUCTION: This exam was performed according to the departmental dose-optimization program which includes automated exposure control, adjustment of the mA and/or kV according to patient size and/or use of iterative reconstruction technique. CONTRAST:  194mL OMNIPAQUE IOHEXOL 300 MG/ML  SOLN COMPARISON:  Multiple priors including most recent CT November 27, 2020 FINDINGS: Lower chest: No acute abnormality. Hepatobiliary: Tiny hypodense hepatic lesions are stable from prior examinations and statistically likely to reflect benign cysts or hemangiomas. Gallbladder surgically absent. Mild prominence of the biliary tree is favored reservoir effect post cholecystectomy. Pancreas: No pancreatic ductal dilation or evidence of acute inflammation. Spleen: Normal in size without focal abnormality. Adrenals/Urinary Tract: Bilateral adrenal glands appear normal. No hydronephrosis. Kidneys demonstrate symmetric enhancement excretion of contrast material. No solid enhancing renal mass. Urinary bladder is unremarkable for degree of distension. Stomach/Bowel: Radiopaque enteric contrast material  traverses the cecum. Prior Roux-en-Y gastric bypass. No pathologic dilation of large or small bowel. Terminal ileum appears normal. The appendix is not confidently identified however there is no pericecal inflammation. Moderate volume of formed stool throughout the colon suggestive of constipation. No evidence of acute bowel inflammation. Vascular/Lymphatic: Normal caliber abdominal aorta. Interval increase in size of the right retroperitoneal soft tissue mass which extends along the crus of the diaphragm. The mass effaces/narrows the IVC and left renal vein now measuring 4.1 x 2.8 cm on image 34/2 previously 2.6 x 1.8 cm. No new sites of abdominopelvic adenopathy. Reproductive: Uterus and bilateral adnexa are unremarkable. Other: No significant abdominopelvic free fluid. No pneumoperitoneum. Postsurgical change in the anterior abdominal wall. Calcified granulomata in the posterior left gluteal subcutaneous soft tissues. Musculoskeletal: L3 vertebral body hemangioma. Multilevel degenerative changes spine. IMPRESSION: 1. Interval increase in size of the right retroperitoneal soft tissue mass which now effaces/narrows the IVC and left renal vein and measures 4.1 x 2.8 cm previously 2.6 x 1.8 cm. 2. No new sites of abdominopelvic adenopathy and no splenomegaly. 3. Moderate volume of formed stool throughout the colon suggestive of constipation. 4. Prior Roux-en-Y gastric bypass without evidence of bowel obstruction. These results will be called to the ordering clinician or representative by the Radiologist Assistant, and communication documented in the PACS or [employer]. Electronically Signed   By: Dahlia Bailiff M.D.   On: 03/15/2021 12:38    ONCOLOGY HISTORY: Retroperitoneal lymph node biopsy on April 21, 2020 confirmed the diagnosis. PET scan on April 14, 2020 revealed a hypermetabolic right sided retroperitoneal nodal mass along with hypermetabolic mesenteric adenopathy.  Patient also noted to have  bilateral axillary adenopathy.  Although patient was asymptomatic and active surveillance was offered, she elected to pursue treatment and received IV Rituxan weekly x4 completing on May 28, 2020.  Repeat PET scan from August 24, 2020 reviewed independently with significant treatment response, although likely residual disease.  Patient previously declined maintenance Rituxan.   ASSESSMENT: Recurrent stage III follicular lymphoma, grade 1/2.    PLAN:    1.  Recurrent stage III follicular lymphoma, grade 1/2:  Repeat imaging on March 15, 2021 reviewed independently with progression of disease.  Patient is also symptomatic.  She has agreed to reinitiate weekly Rituxan x4 followed by maintenance Rituxan.  Can also consider Zevalin in the near future.  Proceed with cycle 2 of 4 of weekly Rituxan.  Return to clinic in 1 week for further  evaluation and consideration of cycle 3.   2.  Hypermetabolic thyroid nodule: Previously, biopsy was negative for malignancy. 3.  Elevated liver enzymes: Resolved. 4.  Polycythemia: Chronic and unchanged. 5.  Leukopenia: Resolved.  I spent a total of 30 minutes reviewing chart data, face-to-face evaluation with the patient, counseling and coordination of care as detailed above.  Patient expressed understanding and was in agreement with this plan. She also understands that She can call clinic at any time with any questions, concerns, or complaints.    Cancer Staging  Follicular lymphoma ([place]) Staging form: Hodgkin and Non-Hodgkin Lymphoma, AJCC 8th Edition - Clinical stage from 0/38/8828: Stage III (Follicular lymphoma) - Signed by [name], MD on 04/29/2020 Stage prefix: Initial diagnosis   [name], MD   04/07/2021 10:06 AM

## 2021-04-05 ENCOUNTER — Encounter: Payer: Self-pay | Admitting: Licensed Clinical Social Worker

## 2021-04-05 NOTE — Progress Notes (Signed)
[place] Clinical Social Work  ?Initial Assessment ? ? ?Melinda [name] is a 61 y.o. year old female contacted by phone. Clinical Social Work was referred by medical provider for assessment of psychosocial needs.  ? ?SDOH (Social Determinants of Health) assessments performed: Yes ?SDOH Interventions   ? ?Flowsheet Row Most Recent Value  ?SDOH Interventions   ?Food Insecurity Interventions Intervention Not Indicated  ?Housing Interventions Intervention Not Indicated  ?Intimate Partner Violence Interventions Intervention Not Indicated  ?Physical Activity Interventions Intervention Not Indicated  ?Stress Interventions Intervention Not Indicated  ?Social Connections Interventions Intervention Not Indicated  ?Transportation Interventions Intervention Not Indicated  ?Depression Interventions/Treatment  Counseling  ? ?  ?  ?Distress Screen completed: No ?ONCBCN DISTRESS SCREENING 04/15/2020  ?Screening Type Initial Screening  ?Distress experienced in past week (1-10) 5  ?Information Concerns Type Lack of info about diagnosis  ? ? ? ? ?Family/Social Information:  ?Housing Arrangement: patient lives with spouse , Melinda, [name] (Spouse) [phone] and patient's mother is currently staying with her. ?Family members/support persons in your life? Family, Medical Staff, and Friends/Colleagues ?Transportation concerns: no  ?Employment: Out on work excuse. Income source: Short-Term Disability ?Financial concerns: Yes, due to illness and/or loss of work during treatment ?Type of concern: Utilities and Medical bills ?Food access concerns: no ?Religious or spiritual practice: N/A ?Services Currently in place:  Short-term disability ? ?Coping/ Adjustment to diagnosis: ?Patient understands treatment plan and what happens next? yes ?Concerns about diagnosis and/or treatment: Losing my job, Software engineer by information, Afraid of cancer, and How I will pay for the services I need ?Patient reported stressors: Finances, Anxiety, Adjusting to my  illness, Loss of sense of purpose, and Physical issues ?Hopes and priorities: Patient states she is hopeful treatment will be successful. ?Patient enjoys time with family/ friends ?Current coping skills/ strengths: Average or above average intelligence , Capable of independent living , Communication skills , and Supportive family/friends  ? ? ? SUMMARY: ?Current SDOH Barriers:  ?Financial constraints related to fixed income, possibility she'll lose her job due to illness, Level of care concerns, and Mental Health Concerns  ? ?Clinical Social Work Clinical Goal(s):  ?patient will work with SW to address concerns related to anxiety and stress. ? ?Interventions: ?Discussed common feeling and emotions when being diagnosed with cancer, and the importance of support during treatment ?Informed patient of the support team roles and support services at [facility] ?Provided CSW contact information and encouraged patient to call with any questions or concerns ?Referred patient to [facility], Provided patient with information about CSW role inpatient care and available resources, and scheduled counseling appointment with patient on 04/09/2021 at 9:30AM ? ? ?Follow Up Plan: Patient will contact CSW with any support or resource needs and CSW will see patient on 04/09/2021 at 9:30AM ?Patient verbalizes understanding of plan: Yes ? ? ? ?Melinda Pflum, LCSW ?

## 2021-04-05 NOTE — Progress Notes (Signed)
[place] ?Clinical Social Work ? ?Clinical Social Work was referred by medical provider for assessment of psychosocial needs.  Clinical Social Worker contacted patient by phone  to offer support and assess for needs.  CSW left voicemail with contact information and request for return call. ? ? ?Braidyn Peace, LCSW  ?Clinical Social Worker ?[place] ?      ?First Attempt ?

## 2021-04-07 ENCOUNTER — Other Ambulatory Visit: Payer: Self-pay

## 2021-04-07 ENCOUNTER — Inpatient Hospital Stay: Payer: BC Managed Care – PPO

## 2021-04-07 ENCOUNTER — Inpatient Hospital Stay (HOSPITAL_BASED_OUTPATIENT_CLINIC_OR_DEPARTMENT_OTHER): Payer: BC Managed Care – PPO | Admitting: Oncology

## 2021-04-07 ENCOUNTER — Encounter: Payer: Self-pay | Admitting: Oncology

## 2021-04-07 VITALS — BP 137/95 | HR 55 | Temp 97.8°F | Wt 161.0 lb

## 2021-04-07 VITALS — BP 114/85 | HR 57 | Resp 16

## 2021-04-07 DIAGNOSIS — C8218 Follicular lymphoma grade II, lymph nodes of multiple sites: Secondary | ICD-10-CM

## 2021-04-07 DIAGNOSIS — Z5112 Encounter for antineoplastic immunotherapy: Secondary | ICD-10-CM | POA: Diagnosis not present

## 2021-04-07 DIAGNOSIS — C8220 Follicular lymphoma grade III, unspecified, unspecified site: Secondary | ICD-10-CM | POA: Diagnosis not present

## 2021-04-07 DIAGNOSIS — Z79899 Other long term (current) drug therapy: Secondary | ICD-10-CM | POA: Diagnosis not present

## 2021-04-07 LAB — CBC WITH DIFFERENTIAL/PLATELET
Abs Immature Granulocytes: 0.01 10*3/uL (ref 0.00–0.07)
Basophils Absolute: 0.1 10*3/uL (ref 0.0–0.1)
Basophils Relative: 1 %
Eosinophils Absolute: 0.1 10*3/uL (ref 0.0–0.5)
Eosinophils Relative: 2 %
HCT: 47.4 % — ABNORMAL HIGH (ref 36.0–46.0)
Hemoglobin: 15.9 g/dL — ABNORMAL HIGH (ref 12.0–15.0)
Immature Granulocytes: 0 %
Lymphocytes Relative: 36 %
Lymphs Abs: 2 10*3/uL (ref 0.7–4.0)
MCH: 31.2 pg (ref 26.0–34.0)
MCHC: 33.5 g/dL (ref 30.0–36.0)
MCV: 92.9 fL (ref 80.0–100.0)
Monocytes Absolute: 0.5 10*3/uL (ref 0.1–1.0)
Monocytes Relative: 9 %
Neutro Abs: 2.9 10*3/uL (ref 1.7–7.7)
Neutrophils Relative %: 52 %
Platelets: 200 10*3/uL (ref 150–400)
RBC: 5.1 MIL/uL (ref 3.87–5.11)
RDW: 13.2 % (ref 11.5–15.5)
WBC: 5.7 10*3/uL (ref 4.0–10.5)
nRBC: 0 % (ref 0.0–0.2)

## 2021-04-07 LAB — COMPREHENSIVE METABOLIC PANEL
ALT: 36 U/L (ref 0–44)
AST: 29 U/L (ref 15–41)
Albumin: 4.3 g/dL (ref 3.5–5.0)
Alkaline Phosphatase: 49 U/L (ref 38–126)
Anion gap: 4 — ABNORMAL LOW (ref 5–15)
BUN: 18 mg/dL (ref 8–23)
CO2: 29 mmol/L (ref 22–32)
Calcium: 9.2 mg/dL (ref 8.9–10.3)
Chloride: 104 mmol/L (ref 98–111)
Creatinine, Ser: 0.48 mg/dL (ref 0.44–1.00)
GFR, Estimated: >60 mL/min (ref >60)
Glucose, Bld: 97 mg/dL (ref 70–99)
Potassium: 5.1 mmol/L (ref 3.5–5.1)
Sodium: 137 mmol/L (ref 135–145)
Total Bilirubin: 1.1 mg/dL (ref 0.3–1.2)
Total Protein: 6.9 g/dL (ref 6.5–8.1)

## 2021-04-07 MED ORDER — SODIUM CHLORIDE 0.9 % IV SOLN
Freq: Once | INTRAVENOUS | Status: AC
  Filled 2021-04-07: qty 250

## 2021-04-07 MED ORDER — SODIUM CHLORIDE 0.9% FLUSH
10.0000 mL | INTRAVENOUS | Status: DC | PRN
  Filled 2021-04-07: qty 10

## 2021-04-07 MED ORDER — SODIUM CHLORIDE 0.9 % IV SOLN: 375.0000 mg/m2 | Freq: Once | INTRAVENOUS | Status: DC

## 2021-04-07 MED ORDER — SODIUM CHLORIDE 0.9 % IV SOLN
375.0000 mg/m2 | Freq: Once | INTRAVENOUS | Status: AC
  Administered 2021-04-07: 700 mg via INTRAVENOUS
  Filled 2021-04-07: qty 20

## 2021-04-07 MED ORDER — ACETAMINOPHEN 325 MG PO TABS
650.0000 mg | ORAL_TABLET | Freq: Once | ORAL | Status: AC
  Administered 2021-04-07: 650 mg via ORAL
  Filled 2021-04-07: qty 2

## 2021-04-07 MED ORDER — DIPHENHYDRAMINE HCL 25 MG PO CAPS
25.0000 mg | ORAL_CAPSULE | Freq: Once | ORAL | Status: AC
  Administered 2021-04-07: 25 mg via ORAL
  Filled 2021-04-07: qty 1

## 2021-04-07 NOTE — Patient Instructions (Signed)
[facility] AT [place]  Discharge Instructions: Thank you for choosing [facility] to provide your oncology and hematology care.  If you have a lab appointment with the [facility], please go directly to the [facility] and check in at the registration area.  Wear comfortable clothing and clothing appropriate for easy access to any Portacath or PICC line.   We strive to give you quality time with your provider. You may need to reschedule your appointment if you arrive late (15 or more minutes).  Arriving late affects you and other patients whose appointments are after yours.  Also, if you miss three or more appointments without notifying the office, you may be dismissed from the clinic at the providers discretion.      For prescription refill requests, have your pharmacy contact our office and allow 72 hours for refills to be completed.    Today you received the following chemotherapy and/or immunotherapy agents - rituximab      To help prevent nausea and vomiting after your treatment, we encourage you to take your nausea medication as directed.  BELOW ARE SYMPTOMS THAT SHOULD BE REPORTED IMMEDIATELY: *FEVER GREATER THAN 100.4 F (38 C) OR HIGHER *CHILLS OR SWEATING *NAUSEA AND VOMITING THAT IS NOT CONTROLLED WITH YOUR NAUSEA MEDICATION *UNUSUAL SHORTNESS OF BREATH *UNUSUAL BRUISING OR BLEEDING *URINARY PROBLEMS (pain or burning when urinating, or frequent urination) *BOWEL PROBLEMS (unusual diarrhea, constipation, pain near the anus) TENDERNESS IN MOUTH AND THROAT WITH OR WITHOUT PRESENCE OF ULCERS (sore throat, sores in mouth, or a toothache) UNUSUAL RASH, SWELLING OR PAIN  UNUSUAL VAGINAL DISCHARGE OR ITCHING   Items with * indicate a potential emergency and should be followed up as soon as possible or go to the Emergency Department if any problems should occur.  Please show the CHEMOTHERAPY ALERT CARD or IMMUNOTHERAPY ALERT CARD at check-in to  the Emergency Department and triage nurse.  Should you have questions after your visit or need to cancel or reschedule your appointment, please contact [facility] [place] AT [place]  [phone] and follow the prompts.  Office hours are 8:00 a.m. to 4:30 p.m. Monday - Friday. Please note that voicemails left after 4:00 p.m. may not be returned until the following business day.  We are closed weekends and major holidays. You have access to a nurse at all times for urgent questions. Please call the main number to the clinic [phone] and follow the prompts.  For any non-urgent questions, you may also contact your provider using MyChart. We now offer e-Visits for anyone 64 and older to request care online for non-urgent symptoms. For details visit [email or web address].   Also download the MyChart app! Go to the app store, search "MyChart", open the app, select [facility], and log in with your MyChart username and password.  Due to Covid, a mask is required upon entering the hospital/clinic. If you do not have a mask, one will be given to you upon arrival. For doctor visits, patients may have 1 support person aged 30 or older with them. For treatment visits, patients cannot have anyone with them due to current Covid guidelines and our immunocompromised population.   Rituximab Injection What is this medication? RITUXIMAB (ri TUX i mab) is a monoclonal antibody. It is used to treat certain types of cancer like non-Hodgkin lymphoma and chronic lymphocytic leukemia. It is also used to treat rheumatoid arthritis, granulomatosis with polyangiitis, microscopic polyangiitis, and pemphigus vulgaris. This medicine may be used for other  purposes; ask your health care provider or pharmacist if you have questions. COMMON BRAND NAME(S): RIABNI, Rituxan, RUXIENCE What should I tell my care team before I take this medication? They need to know if you have any of these conditions: chest  pain heart disease infection especially a viral infection such as chickenpox, cold sores, hepatitis B, or herpes immune system problems irregular heartbeat or rhythm kidney disease low blood counts (white cells, platelets, or red cells) lung disease recent or upcoming vaccine an unusual or allergic reaction to rituximab, other medicines, foods, dyes, or preservatives pregnant or trying to get pregnant breast-feeding How should I use this medication? This medicine is injected into a vein. It is given by a health care provider in a hospital or clinic setting. A special MedGuide will be given to you before each treatment. Be sure to read this information carefully each time. Talk to your health care provider about the use of this medicine in children. While this drug may be prescribed for children as young as 6 months for selected conditions, precautions do apply. Overdosage: If you think you have taken too much of this medicine contact a poison control center or emergency room at once. NOTE: This medicine is only for you. Do not share this medicine with others. What if I miss a dose? Keep appointments for follow-up doses. It is important not to miss your dose. Call your health care provider if you are unable to keep an appointment. What may interact with this medication? Do not take this medicine with any of the following medicines: live vaccines This medicine may also interact with the following medicines: cisplatin This list may not describe all possible interactions. Give your health care provider a list of all the medicines, herbs, non-prescription drugs, or dietary supplements you use. Also tell them if you smoke, drink alcohol, or use illegal drugs. Some items may interact with your medicine. What should I watch for while using this medication? Your condition will be monitored carefully while you are receiving this medicine. You may need blood work done while you are taking this  medicine. This medicine can cause serious infusion reactions. To reduce the risk your health care provider may give you other medicines to take before receiving this one. Be sure to follow the directions from your health care provider. This medicine may increase your risk of getting an infection. Call your health care provider for advice if you get a fever, chills, sore throat, or other symptoms of a cold or flu. Do not treat yourself. Try to avoid being around people who are sick. Call your health care provider if you are around anyone with measles, chickenpox, or if you develop sores or blisters that do not heal properly. Avoid taking medicines that contain aspirin, acetaminophen, ibuprofen, naproxen, or ketoprofen unless instructed by your health care provider. These medicines may hide a fever. This medicine may cause serious skin reactions. They can happen weeks to months after starting the medicine. Contact your health care provider right away if you notice fevers or flu-like symptoms with a rash. The rash may be red or purple and then turn into blisters or peeling of the skin. Or, you might notice a red rash with swelling of the face, lips or lymph nodes in your neck or under your arms. In some patients, this medicine may cause a serious brain infection that may cause death. If you have any problems seeing, thinking, speaking, walking, or standing, tell your healthcare professional right away. If  you cannot reach your healthcare professional, urgently seek other source of medical care. Do not become pregnant while taking this medicine or for at least 12 months after stopping it. Women should inform their health care provider if they wish to become pregnant or think they might be pregnant. There is potential for serious harm to an unborn child. Talk to your health care provider for more information. Women should use a reliable form of birth control while taking this medicine and for 12 months after  stopping it. Do not breast-feed while taking this medicine or for at least 6 months after stopping it. What side effects may I notice from receiving this medication? Side effects that you should report to your health care provider as soon as possible: allergic reactions (skin rash, itching or hives; swelling of the face, lips, or tongue) diarrhea edema (sudden weight gain; swelling of the ankles, feet, hands or other unusual swelling; trouble breathing) fast, irregular heartbeat heart attack (trouble breathing; pain or tightness in the chest, neck, back or arms; unusually weak or tired) infection (fever, chills, cough, sore throat, pain or trouble passing urine) kidney injury (trouble passing urine or change in the amount of urine) liver injury (dark yellow or brown urine; general ill feeling or flu-like symptoms; loss of appetite, right upper belly pain; unusually weak or tired, yellowing of the eyes or skin) low blood pressure (dizziness; feeling faint or lightheaded, falls; unusually weak or tired) low red blood cell counts (trouble breathing; feeling faint; lightheaded, falls; unusually weak or tired) mouth sores redness, blistering, peeling, or loosening of the skin, including inside the mouth stomach pain unusual bruising or bleeding wheezing (trouble breathing with loud or whistling sounds) vomiting Side effects that usually do not require medical attention (report to your health care provider if they continue or are bothersome): headache joint pain muscle cramps, pain nausea This list may not describe all possible side effects. Call your doctor for medical advice about side effects. You may report side effects to FDA at 1-800-FDA-1088. Where should I keep my medication? This medicine is given in a hospital or clinic. It will not be stored at home. NOTE: This sheet is a summary. It may not cover all possible information. If you have questions about this medicine, talk to your  doctor, pharmacist, or health care provider.  2022 Elsevier/Gold Standard (2020-01-20 00:00:00)

## 2021-04-07 NOTE — Progress Notes (Signed)
Pt is fatigue but the pain is under control. ? ?

## 2021-04-09 ENCOUNTER — Other Ambulatory Visit: Payer: Self-pay

## 2021-04-09 ENCOUNTER — Encounter: Payer: Self-pay | Admitting: Licensed Clinical Social Worker

## 2021-04-09 ENCOUNTER — Inpatient Hospital Stay: Payer: BC Managed Care – PPO | Admitting: Licensed Clinical Social Worker

## 2021-04-09 DIAGNOSIS — C8200 Follicular lymphoma grade I, unspecified site: Secondary | ICD-10-CM

## 2021-04-09 NOTE — Progress Notes (Unsigned)
[facility]  [phone] [phone]  ID: Melinda Hall OB: [birth year]  MR#: [id number]  XTK#:[id number]  Patient Care Team: [name], MD as PCP - General (Family Medicine) [name], MD as Consulting Physician (Oncology) [name], MD as Consulting Physician (Neurosurgery)  CHIEF COMPLAINT: Recurrent stage III follicular lymphoma, grade 1/2.    INTERVAL HISTORY: Patient returns to clinic today for further evaluation and consideration of cycle 2 of 4 of weekly Rituxan.  She has persistent weakness and fatigue, but states her night sweats have improved after her first infusion.  She denies any fevers.  She does not complaining of any abdominal discomfort today.  She has no neurologic complaints. She has no chest pain, shortness of breath, cough, or hemoptysis.  She denies any nausea, vomiting, constipation, or diarrhea.  She has no urinary complaints.  Patient offers no further specific complaints today.  REVIEW OF SYSTEMS:   Review of Systems  Constitutional:  Positive for diaphoresis and malaise/fatigue. Negative for fever and weight loss.  Respiratory: Negative.  Negative for cough, hemoptysis and shortness of breath.   Cardiovascular: Negative.  Negative for chest pain and leg swelling.  Gastrointestinal: Negative.  Negative for abdominal pain.  Genitourinary: Negative.  Negative for dysuria.  Musculoskeletal: Negative.  Negative for back pain.  Skin: Negative.  Negative for rash.  Neurological:  Positive for weakness. Negative for tingling, sensory change, focal weakness and headaches.  Psychiatric/Behavioral: Negative.  The patient is not nervous/anxious.    As per HPI. Otherwise, a complete review of systems is negative.  PAST MEDICAL HISTORY: Past Medical History:  Diagnosis Date   Cancer ([place])    Dermoid tumor    hx of--left ovary   Gastric bypass status for obesity 2004   s/p roux-en-y   Generalized headaches     frequent   Heart murmur    History of cardiac murmur    Intraductal papilloma of breast, left 06/18/2017   05/2017 - s/p excision 07/2017   Osteoporosis 03/2016   DEXA -2.6 spine   Salivary gland stone    x 2   Smoker     PAST SURGICAL HISTORY: Past Surgical History:  Procedure Laterality Date   ABDOMINAL HYSTERECTOMY     only ovary was removed   ANTERIOR CERVICAL DECOMP/DISCECTOMY FUSION  12/2016   [facility]  ~1992   for dermoid ovarian cysts   BREAST BIOPSY Left 06/13/2017   papilloma with excision 07/2017   BREAST LUMPECTOMY WITH NEEDLE LOCALIZATION Left 08/10/2017   intraductal papilloma - [place], [place], MD   CHOLECYSTECTOMY  2000s   COLONOSCOPY WITH PROPOFOL N/A 11/27/2015   TA rpt 5 yrs Lucilla Lame, MD)   GASTRIC BYPASS  2004   roux en y [facility])   POLYPECTOMY  11/27/2015   Procedure: POLYPECTOMY;  Surgeon: Lucilla Lame, MD;  Location: [facility];  Service: Endoscopy;;   REDUCTION MAMMAPLASTY Bilateral 66's   SALIVARY GLAND SURGERY     SALIVARY STONE REMOVAL      FAMILY HISTORY: Family History  Problem Relation Age of Onset   Cancer Father 61       leukemia   Hypertension Mother    Diabetes Neg Hx    Stroke Neg Hx    Coronary artery disease Neg Hx    Breast cancer Neg Hx     ADVANCED DIRECTIVES (Y/N):  N  HEALTH MAINTENANCE: Social History   Tobacco Use   Smoking  status: Former    Packs/day: 0.25    Years: 20.00    Pack years: 5.00    Types: Cigarettes    Quit date: 10/31/2012    Years since quitting: 8.4   Smokeless tobacco: Never  Vaping Use   Vaping Use: Never used  Substance Use Topics   Alcohol use: Not Currently   Drug use: No     Colonoscopy:  PAP:  Bone density:  Lipid panel:  Allergies  Allergen Reactions   Boniva [Ibandronic Acid] Other (See Comments)    Diffuse body aches    Current Outpatient Medications  Medication Sig Dispense Refill   calcium carbonate (OS-CAL)  600 MG TABS tablet Take 600 mg by mouth daily with breakfast.     Cholecalciferol (VITAMIN D3) 1000 units CAPS Take 3 capsules (3,000 Units total) by mouth daily.     ferrous sulfate 325 (65 FE) MG tablet Take 1 tablet (325 mg total) by mouth every Monday, Wednesday, and Friday.     Magnesium 250 MG TABS Take 1 tablet (250 mg total) by mouth daily.  0   methocarbamol (ROBAXIN) 500 MG tablet Take 1 tablet (500 mg total) by mouth 3 (three) times daily as needed for muscle spasms (sedation precautions). 40 tablet 1   PROLIA 60 MG/ML SOSY injection INJECT '60MG'$  UNDER THE SKIN EVERY 6 MONTHS 1 mL 0   vitamin B-12 (CYANOCOBALAMIN) 1000 MCG tablet Take 1 tablet (1,000 mcg total) by mouth daily.     zinc gluconate 50 MG tablet Take 1 tablet (50 mg total) by mouth daily.     No current facility-administered medications for this visit.    OBJECTIVE: There were no vitals filed for this visit.    There is no height or weight on file to calculate BMI.    ECOG FS:0 - Asymptomatic  General: Well-developed, well-nourished, no acute distress. Eyes: Pink conjunctiva, anicteric sclera. HEENT: Normocephalic, moist mucous membranes. Lungs: No audible wheezing or coughing. Heart: Regular rate and rhythm. Abdomen: Soft, nontender, no obvious distention. Musculoskeletal: No edema, cyanosis, or clubbing. Neuro: Alert, answering all questions appropriately. Cranial nerves grossly intact. Skin: No rashes or petechiae noted. Psych: Normal affect.  LAB RESULTS:  Lab Results  Component Value Date   NA 137 04/07/2021   K 5.1 04/07/2021   CL 104 04/07/2021   CO2 29 04/07/2021   GLUCOSE 97 04/07/2021   BUN 18 04/07/2021   CREATININE 0.48 04/07/2021   CALCIUM 9.2 04/07/2021   PROT 6.9 04/07/2021   ALBUMIN 4.3 04/07/2021   AST 29 04/07/2021   ALT 36 04/07/2021   ALKPHOS 49 04/07/2021   BILITOT 1.1 04/07/2021   GFRNONAA >60 04/07/2021   GFRAA >60 07/10/2013    Lab Results  Component Value Date   WBC  5.7 04/07/2021   NEUTROABS 2.9 04/07/2021   HGB 15.9 (H) 04/07/2021   HCT 47.4 (H) 04/07/2021   MCV 92.9 04/07/2021   PLT 200 04/07/2021     STUDIES: CT Abdomen Pelvis W Contrast  Result Date: 03/15/2021 CLINICAL DATA:  Follow-up lymphoma. EXAM: CT ABDOMEN AND PELVIS WITH CONTRAST TECHNIQUE: Multidetector CT imaging of the abdomen and pelvis was performed using the standard protocol following bolus administration of intravenous contrast. RADIATION DOSE REDUCTION: This exam was performed according to the departmental dose-optimization program which includes automated exposure control, adjustment of the mA and/or kV according to patient size and/or use of iterative reconstruction technique. CONTRAST:  117m OMNIPAQUE IOHEXOL 300 MG/ML  SOLN COMPARISON:  Multiple priors including  most recent CT November 27, 2020 FINDINGS: Lower chest: No acute abnormality. Hepatobiliary: Tiny hypodense hepatic lesions are stable from prior examinations and statistically likely to reflect benign cysts or hemangiomas. Gallbladder surgically absent. Mild prominence of the biliary tree is favored reservoir effect post cholecystectomy. Pancreas: No pancreatic ductal dilation or evidence of acute inflammation. Spleen: Normal in size without focal abnormality. Adrenals/Urinary Tract: Bilateral adrenal glands appear normal. No hydronephrosis. Kidneys demonstrate symmetric enhancement excretion of contrast material. No solid enhancing renal mass. Urinary bladder is unremarkable for degree of distension. Stomach/Bowel: Radiopaque enteric contrast material traverses the cecum. Prior Roux-en-Y gastric bypass. No pathologic dilation of large or small bowel. Terminal ileum appears normal. The appendix is not confidently identified however there is no pericecal inflammation. Moderate volume of formed stool throughout the colon suggestive of constipation. No evidence of acute bowel inflammation. Vascular/Lymphatic: Normal caliber abdominal  aorta. Interval increase in size of the right retroperitoneal soft tissue mass which extends along the crus of the diaphragm. The mass effaces/narrows the IVC and left renal vein now measuring 4.1 x 2.8 cm on image 34/2 previously 2.6 x 1.8 cm. No new sites of abdominopelvic adenopathy. Reproductive: Uterus and bilateral adnexa are unremarkable. Other: No significant abdominopelvic free fluid. No pneumoperitoneum. Postsurgical change in the anterior abdominal wall. Calcified granulomata in the posterior left gluteal subcutaneous soft tissues. Musculoskeletal: L3 vertebral body hemangioma. Multilevel degenerative changes spine. IMPRESSION: 1. Interval increase in size of the right retroperitoneal soft tissue mass which now effaces/narrows the IVC and left renal vein and measures 4.1 x 2.8 cm previously 2.6 x 1.8 cm. 2. No new sites of abdominopelvic adenopathy and no splenomegaly. 3. Moderate volume of formed stool throughout the colon suggestive of constipation. 4. Prior Roux-en-Y gastric bypass without evidence of bowel obstruction. These results will be called to the ordering clinician or representative by the Radiologist Assistant, and communication documented in the PACS or [employer]. Electronically Signed   By: Dahlia Bailiff M.D.   On: 03/15/2021 12:38    ONCOLOGY HISTORY: Retroperitoneal lymph node biopsy on April 21, 2020 confirmed the diagnosis. PET scan on April 14, 2020 revealed a hypermetabolic right sided retroperitoneal nodal mass along with hypermetabolic mesenteric adenopathy.  Patient also noted to have bilateral axillary adenopathy.  Although patient was asymptomatic and active surveillance was offered, she elected to pursue treatment and received IV Rituxan weekly x4 completing on May 28, 2020.  Repeat PET scan from August 24, 2020 reviewed independently with significant treatment response, although likely residual disease.  Patient previously declined maintenance Rituxan.   ASSESSMENT:  Recurrent stage III follicular lymphoma, grade 1/2.    PLAN:    1.  Recurrent stage III follicular lymphoma, grade 1/2:  Repeat imaging on March 15, 2021 reviewed independently with progression of disease.  Patient is also symptomatic.  She has agreed to reinitiate weekly Rituxan x4 followed by maintenance Rituxan.  Can also consider Zevalin in the near future.  Proceed with cycle 2 of 4 of weekly Rituxan.  Return to clinic in 1 week for further evaluation and consideration of cycle 3.   2.  Hypermetabolic thyroid nodule: Previously, biopsy was negative for malignancy. 3.  Elevated liver enzymes: Resolved. 4.  Polycythemia: Chronic and unchanged. 5.  Leukopenia: Resolved.  I spent a total of 30 minutes reviewing chart data, face-to-face evaluation with the patient, counseling and coordination of care as detailed above.  Patient expressed understanding and was in agreement with this plan. She also understands that She can  call clinic at any time with any questions, concerns, or complaints.    Cancer Staging  Follicular lymphoma ([place]) Staging form: Hodgkin and Non-Hodgkin Lymphoma, AJCC 8th Edition - Clinical stage from 7/84/1282: Stage III (Follicular lymphoma) - Signed by [name], MD on 04/29/2020 Stage prefix: Initial diagnosis   [name], MD   04/09/2021 9:10 AM

## 2021-04-09 NOTE — Progress Notes (Signed)
[facility] CSW Progress Note ? ?Clinical Social Worker met with patient to discuss illness adjustment concerns. Patient expressed concerns about adjusting to her current diagnosis.  Patient expressed occasional bouts of crying, anxiety and difficulty accepting how she feels physically, especially when she tired. Patient discussed family dynamics, she stated she has a very supportive family and she feels cared for and loved, but she also feels pressure not "be sick or say I am tired" because she does not want to worry them. CSW and patient discussed different ways to communicate with her family about her concerns and ways to gently st up boundaries when she needs them.  CSW and patient spoke about ways to practice mindfulness and strategies for helping to process feelings about current diagnosis. CSW and patient discussed work and disability application, and next steps for that process. Patient agreed to contact CSW if she has any questions or concerns.  CSW scheduled next appointment for 04/23/2021 at 9:30AM ? ? ? ?Deaundra Kutzer , LCSW ?

## 2021-04-09 NOTE — Telephone Encounter (Signed)
Prolia injection received in the office today from [facility]. Placed paperwork with the Prolia box in the fridge. Added note to the appt desk--DO NOT CHARGE FOR MED ?

## 2021-04-14 ENCOUNTER — Other Ambulatory Visit: Payer: Self-pay

## 2021-04-14 ENCOUNTER — Inpatient Hospital Stay (HOSPITAL_BASED_OUTPATIENT_CLINIC_OR_DEPARTMENT_OTHER): Payer: BC Managed Care – PPO | Admitting: Oncology

## 2021-04-14 ENCOUNTER — Inpatient Hospital Stay: Payer: BC Managed Care – PPO

## 2021-04-14 VITALS — BP 119/85 | HR 58 | Temp 97.9°F | Resp 16 | Ht 65.0 in | Wt 162.8 lb

## 2021-04-14 VITALS — BP 106/67 | HR 60

## 2021-04-14 DIAGNOSIS — Z5112 Encounter for antineoplastic immunotherapy: Secondary | ICD-10-CM | POA: Diagnosis not present

## 2021-04-14 DIAGNOSIS — C8220 Follicular lymphoma grade III, unspecified, unspecified site: Secondary | ICD-10-CM | POA: Diagnosis not present

## 2021-04-14 DIAGNOSIS — Z79899 Other long term (current) drug therapy: Secondary | ICD-10-CM | POA: Diagnosis not present

## 2021-04-14 DIAGNOSIS — C8218 Follicular lymphoma grade II, lymph nodes of multiple sites: Secondary | ICD-10-CM

## 2021-04-14 LAB — CBC WITH DIFFERENTIAL/PLATELET
Abs Immature Granulocytes: 0.01 K/uL (ref 0.00–0.07)
Basophils Absolute: 0.1 K/uL (ref 0.0–0.1)
Basophils Relative: 1 %
Eosinophils Absolute: 0.1 K/uL (ref 0.0–0.5)
Eosinophils Relative: 2 %
HCT: 47 % — ABNORMAL HIGH (ref 36.0–46.0)
Hemoglobin: 15.4 g/dL — ABNORMAL HIGH (ref 12.0–15.0)
Immature Granulocytes: 0 %
Lymphocytes Relative: 29 %
Lymphs Abs: 1.9 K/uL (ref 0.7–4.0)
MCH: 30.6 pg (ref 26.0–34.0)
MCHC: 32.8 g/dL (ref 30.0–36.0)
MCV: 93.4 fL (ref 80.0–100.0)
Monocytes Absolute: 0.7 K/uL (ref 0.1–1.0)
Monocytes Relative: 10 %
Neutro Abs: 3.9 K/uL (ref 1.7–7.7)
Neutrophils Relative %: 58 %
Platelets: 200 K/uL (ref 150–400)
RBC: 5.03 MIL/uL (ref 3.87–5.11)
RDW: 13.5 % (ref 11.5–15.5)
WBC: 6.7 K/uL (ref 4.0–10.5)
nRBC: 0 % (ref 0.0–0.2)

## 2021-04-14 LAB — COMPREHENSIVE METABOLIC PANEL
ALT: 42 U/L (ref 0–44)
AST: 30 U/L (ref 15–41)
Albumin: 4 g/dL (ref 3.5–5.0)
Alkaline Phosphatase: 48 U/L (ref 38–126)
Anion gap: 6 (ref 5–15)
BUN: 19 mg/dL (ref 8–23)
CO2: 29 mmol/L (ref 22–32)
Calcium: 9.1 mg/dL (ref 8.9–10.3)
Chloride: 102 mmol/L (ref 98–111)
Creatinine, Ser: 0.6 mg/dL (ref 0.44–1.00)
GFR, Estimated: >60 mL/min (ref >60)
Glucose, Bld: 97 mg/dL (ref 70–99)
Potassium: 4.2 mmol/L (ref 3.5–5.1)
Sodium: 137 mmol/L (ref 135–145)
Total Bilirubin: 0.8 mg/dL (ref 0.3–1.2)
Total Protein: 6.6 g/dL (ref 6.5–8.1)

## 2021-04-14 MED ORDER — DIPHENHYDRAMINE HCL 25 MG PO CAPS
25.0000 mg | ORAL_CAPSULE | Freq: Once | ORAL | Status: AC
  Administered 2021-04-14: 25 mg via ORAL
  Filled 2021-04-14: qty 1

## 2021-04-14 MED ORDER — SODIUM CHLORIDE 0.9 % IV SOLN
Freq: Once | INTRAVENOUS | Status: AC
  Filled 2021-04-14: qty 250

## 2021-04-14 MED ORDER — SODIUM CHLORIDE 0.9 % IV SOLN
375.0000 mg/m2 | Freq: Once | INTRAVENOUS | Status: AC
  Administered 2021-04-14: 700 mg via INTRAVENOUS
  Filled 2021-04-14: qty 70

## 2021-04-14 MED ORDER — ACETAMINOPHEN 325 MG PO TABS
650.0000 mg | ORAL_TABLET | Freq: Once | ORAL | Status: AC
  Administered 2021-04-14: 650 mg via ORAL
  Filled 2021-04-14: qty 2

## 2021-04-14 NOTE — Progress Notes (Signed)
Pt c/o generalized achiness, but not everyday. Pt also reports fatigue not relieved by rest. States she has nausea on the day of and the day after chemo. ?

## 2021-04-15 ENCOUNTER — Ambulatory Visit (INDEPENDENT_AMBULATORY_CARE_PROVIDER_SITE_OTHER): Payer: BC Managed Care – PPO

## 2021-04-15 DIAGNOSIS — M810 Age-related osteoporosis without current pathological fracture: Secondary | ICD-10-CM

## 2021-04-15 MED ORDER — DENOSUMAB 60 MG/ML ~~LOC~~ SOSY
60.0000 mg | PREFILLED_SYRINGE | Freq: Once | SUBCUTANEOUS | Status: AC
  Administered 2021-04-15: 60 mg via SUBCUTANEOUS

## 2021-04-15 NOTE — Progress Notes (Signed)
Per orders of Allie Bossier, in the absence of Dr. Danise Mina, injection of Prolia given by Loreen Freud. ?Patient tolerated injection well.  ?

## 2021-04-18 NOTE — Progress Notes (Signed)
?[place]  ?[phone] [phone] ? ?ID: Melinda Hall OB: 09/17/1960  MR#: [id number]  DDU#:[id number] ? ?Patient Care Team: ?[name], MD as PCP - General (Family Medicine) ?[name], MD as Consulting Physician (Oncology) ?[name], MD as Consulting Physician (Neurosurgery) ? ?CHIEF COMPLAINT: Recurrent stage III follicular lymphoma, grade 1/2.   ? ?INTERVAL HISTORY: Patient returns to clinic today for further evaluation and consideration of cycle 4 of 4 of weekly Rituxan.  She continues to have chronic weakness and fatigue, but otherwise is tolerating her treatment well.  She does not complain of myalgia today.  She no longer has any night sweats.  She denies any fevers. She does not complaining of any abdominal discomfort today.  She has no neurologic complaints. She has no chest pain, shortness of breath, cough, or hemoptysis.  She denies any nausea, vomiting, constipation, or diarrhea.  She has no urinary complaints.  Patient offers no further specific complaints today. ? ?REVIEW OF SYSTEMS:   ?Review of Systems  ?Constitutional:  Positive for malaise/fatigue. Negative for diaphoresis, fever and weight loss.  ?Respiratory: Negative.  Negative for cough, hemoptysis and shortness of breath.   ?Cardiovascular: Negative.  Negative for chest pain and leg swelling.  ?Gastrointestinal: Negative.  Negative for abdominal pain.  ?Genitourinary: Negative.  Negative for dysuria.  ?Musculoskeletal: Negative.  Negative for back pain and myalgias.  ?Skin: Negative.  Negative for rash.  ?Neurological:  Positive for weakness. Negative for tingling, sensory change, focal weakness and headaches.  ?Psychiatric/Behavioral: Negative.  The patient is not nervous/anxious.   ? ?As per HPI. Otherwise, a complete review of systems is negative. ? ?PAST MEDICAL HISTORY: ?Past Medical History:  ?Diagnosis Date  ? Cancer [facility])   ? Dermoid tumor   ? hx of--left ovary  ?  Gastric bypass status for obesity 2004  ? s/p roux-en-y  ? Generalized headaches   ? frequent  ? Heart murmur   ? History of cardiac murmur   ? Intraductal papilloma of breast, left 06/18/2017  ? 05/2017 - s/p excision 07/2017  ? Osteoporosis 03/2016  ? DEXA -2.6 spine  ? Salivary gland stone   ? x 2  ? Smoker   ? ? ?PAST SURGICAL HISTORY: ?Past Surgical History:  ?Procedure Laterality Date  ? ABDOMINAL HYSTERECTOMY    ? only ovary was removed  ? ANTERIOR CERVICAL DECOMP/DISCECTOMY FUSION  12/2016  ? Pool  ? APPENDECTOMY  1980  ? BILATERAL OOPHORECTOMY  ~1992  ? for dermoid ovarian cysts  ? BREAST BIOPSY Left 06/13/2017  ? papilloma with excision 07/2017  ? BREAST LUMPECTOMY WITH NEEDLE LOCALIZATION Left 08/10/2017  ? intraductal papilloma - Pabon, Marjory Lies, MD  ? CHOLECYSTECTOMY  2000s  ? COLONOSCOPY WITH PROPOFOL N/A 11/27/2015  ? TA rpt 5 yrs Lucilla Lame, MD)  ? GASTRIC BYPASS  2004  ? roux en y [facility])  ? POLYPECTOMY  11/27/2015  ? Procedure: POLYPECTOMY;  Surgeon: Lucilla Lame, MD;  Location: [place];  Service: Endoscopy;;  ? REDUCTION MAMMAPLASTY Bilateral 1990's  ? SALIVARY GLAND SURGERY    ? SALIVARY STONE REMOVAL    ? ? ?FAMILY HISTORY: ?Family History  ?Problem Relation Age of Onset  ? Cancer Father 60  ?     leukemia  ? Hypertension Mother   ? Diabetes Neg Hx   ? Stroke Neg Hx   ? Coronary artery disease Neg Hx   ? Breast cancer Neg Hx   ? ? ?ADVANCED DIRECTIVES (Y/N):  N ? ?HEALTH MAINTENANCE: ?Social History  ? ?Tobacco Use  ? Smoking status: Former  ?  Packs/day: 0.25  ?  Years: 20.00  ?  Pack years: 5.00  ?  Types: Cigarettes  ?  Quit date: 10/31/2012  ?  Years since quitting: 8.4  ? Smokeless tobacco: Never  ?Vaping Use  ? Vaping Use: Never used  ?Substance Use Topics  ? Alcohol use: Not Currently  ? Drug use: No  ? ? ? Colonoscopy: ? PAP: ? Bone density: ? Lipid panel: ? ?Allergies  ?Allergen Reactions  ? Boniva [Ibandronic Acid] Other (See Comments)  ?  Diffuse body aches  ? ? ?Current  Outpatient Medications  ?Medication Sig Dispense Refill  ? calcium carbonate (OS-CAL) 600 MG TABS tablet Take 600 mg by mouth daily with breakfast.    ? Cholecalciferol (VITAMIN D3) 1000 units CAPS Take 3 capsules (3,000 Units total) by mouth daily.    ? ferrous sulfate 325 (65 FE) MG tablet Take 1 tablet (325 mg total) by mouth every Monday, Wednesday, and Friday.    ? Magnesium 250 MG TABS Take 1 tablet (250 mg total) by mouth daily.  0  ? methocarbamol (ROBAXIN) 500 MG tablet Take 1 tablet (500 mg total) by mouth 3 (three) times daily as needed for muscle spasms (sedation precautions). 40 tablet 1  ? PROLIA 60 MG/ML SOSY injection INJECT '60MG'$  UNDER THE SKIN EVERY 6 MONTHS 1 mL 0  ? vitamin B-12 (CYANOCOBALAMIN) 1000 MCG tablet Take 1 tablet (1,000 mcg total) by mouth daily.    ? zinc gluconate 50 MG tablet Take 1 tablet (50 mg total) by mouth daily.    ? ?No current facility-administered medications for this visit.  ? ? ?OBJECTIVE: ?Vitals:  ? 04/21/21 0829  ?BP: 134/88  ?Pulse: (!) 58  ?Resp: 16  ?Temp: (!) 97.2 ?F (36.2 ?C)  ?SpO2: 99%  ?   Body mass index is 27.29 kg/m?Marland Kitchen    ECOG FS:0 - Asymptomatic ? ?General: Well-developed, well-nourished, no acute distress. ?Eyes: Pink conjunctiva, anicteric sclera. ?HEENT: Normocephalic, moist mucous membranes. ?Lungs: No audible wheezing or coughing. ?Heart: Regular rate and rhythm. ?Abdomen: Soft, nontender, no obvious distention. ?Musculoskeletal: No edema, cyanosis, or clubbing. ?Neuro: Alert, answering all questions appropriately. Cranial nerves grossly intact. ?Skin: No rashes or petechiae noted. ?Psych: Normal affect. ? ?LAB RESULTS: ? ?Lab Results  ?Component Value Date  ? NA 135 04/21/2021  ? K 5.7 (H) 04/21/2021  ? CL 101 04/21/2021  ? CO2 30 04/21/2021  ? GLUCOSE 99 04/21/2021  ? BUN 19 04/21/2021  ? CREATININE 0.64 04/21/2021  ? CALCIUM 9.1 04/21/2021  ? PROT 6.9 04/21/2021  ? ALBUMIN 4.1 04/21/2021  ? AST 27 04/21/2021  ? ALT 42 04/21/2021  ? ALKPHOS 50  04/21/2021  ? BILITOT 0.8 04/21/2021  ? GFRNONAA >60 04/21/2021  ? GFRAA >60 07/10/2013  ? ? ?Lab Results  ?Component Value Date  ? WBC 6.2 04/21/2021  ? NEUTROABS 3.8 04/21/2021  ? HGB 15.4 (H) 04/21/2021  ? HCT 47.5 (H) 04/21/2021  ? MCV 94.2 04/21/2021  ? PLT 200 04/21/2021  ? ? ? ?STUDIES: ?No results found. ? ?ONCOLOGY HISTORY: Retroperitoneal lymph node biopsy on April 21, 2020 confirmed the diagnosis. PET scan on April 14, 2020 revealed a hypermetabolic right sided retroperitoneal nodal mass along with hypermetabolic mesenteric adenopathy.  Patient also noted to have bilateral axillary adenopathy.  Although patient was asymptomatic and active surveillance was offered, she elected to pursue treatment and received IV  Rituxan weekly x4 completing on May 28, 2020.  Repeat PET scan from August 24, 2020 reviewed independently with significant treatment response, although likely residual disease.  Patient previously declined maintenance Rituxan.  ? ?ASSESSMENT: Recurrent stage III follicular lymphoma, grade 1/2.   ? ?PLAN:   ? ?1.  Recurrent stage III follicular lymphoma, grade 1/2:  Repeat imaging on March 15, 2021 reviewed independently with progression of disease.  Patient is also symptomatic.  She has agreed to reinitiate weekly Rituxan x4 followed by maintenance Rituxan every 8 weeks for up to 2 years.  Can also consider Zevalin in the near future.  Proceed with cycle 4 of 4 of weekly Rituxan today.  Repeat CT scan in approximately 6 weeks and then patient will return to clinic in 8 weeks for further evaluation and initiation of maintenance Rituxan.    ?2.  Hypermetabolic thyroid nodule: Previously, biopsy was negative for malignancy. ?3.  Elevated liver enzymes: Resolved. ?4.  Polycythemia: Chronic and unchanged. ?5.  Myalgias: Patient does not complain of this today.  Continue ibuprofen as needed. ?6.  Hyperkalemia: Patient was given 1 dose of IV Lasix today. ? ? ?Patient expressed understanding and was in  agreement with this plan. She also understands that She can call clinic at any time with any questions, concerns, or complaints.  ? ? Cancer Staging  ?Follicular lymphoma ([place]) ?Staging form: Hodgkin and Non-Hodgkin

## 2021-04-21 ENCOUNTER — Inpatient Hospital Stay: Payer: BC Managed Care – PPO

## 2021-04-21 ENCOUNTER — Other Ambulatory Visit: Payer: Self-pay

## 2021-04-21 ENCOUNTER — Inpatient Hospital Stay (HOSPITAL_BASED_OUTPATIENT_CLINIC_OR_DEPARTMENT_OTHER): Payer: BC Managed Care – PPO | Admitting: Oncology

## 2021-04-21 VITALS — BP 104/69 | HR 68 | Temp 98.9°F | Resp 17

## 2021-04-21 VITALS — BP 134/88 | HR 58 | Temp 97.2°F | Resp 16 | Wt 164.0 lb

## 2021-04-21 DIAGNOSIS — Z5112 Encounter for antineoplastic immunotherapy: Secondary | ICD-10-CM | POA: Diagnosis not present

## 2021-04-21 DIAGNOSIS — C8220 Follicular lymphoma grade III, unspecified, unspecified site: Secondary | ICD-10-CM | POA: Diagnosis not present

## 2021-04-21 DIAGNOSIS — C8218 Follicular lymphoma grade II, lymph nodes of multiple sites: Secondary | ICD-10-CM

## 2021-04-21 DIAGNOSIS — Z79899 Other long term (current) drug therapy: Secondary | ICD-10-CM | POA: Diagnosis not present

## 2021-04-21 LAB — COMPREHENSIVE METABOLIC PANEL
ALT: 42 U/L (ref 0–44)
AST: 27 U/L (ref 15–41)
Albumin: 4.1 g/dL (ref 3.5–5.0)
Alkaline Phosphatase: 50 U/L (ref 38–126)
Anion gap: 4 — ABNORMAL LOW (ref 5–15)
BUN: 19 mg/dL (ref 8–23)
CO2: 30 mmol/L (ref 22–32)
Calcium: 9.1 mg/dL (ref 8.9–10.3)
Chloride: 101 mmol/L (ref 98–111)
Creatinine, Ser: 0.64 mg/dL (ref 0.44–1.00)
GFR, Estimated: >60 mL/min (ref >60)
Glucose, Bld: 99 mg/dL (ref 70–99)
Potassium: 5.7 mmol/L — ABNORMAL HIGH (ref 3.5–5.1)
Sodium: 135 mmol/L (ref 135–145)
Total Bilirubin: 0.8 mg/dL (ref 0.3–1.2)
Total Protein: 6.9 g/dL (ref 6.5–8.1)

## 2021-04-21 LAB — CBC WITH DIFFERENTIAL/PLATELET
Abs Immature Granulocytes: 0.02 10*3/uL (ref 0.00–0.07)
Basophils Absolute: 0.1 10*3/uL (ref 0.0–0.1)
Basophils Relative: 1 %
Eosinophils Absolute: 0.1 10*3/uL (ref 0.0–0.5)
Eosinophils Relative: 1 %
HCT: 47.5 % — ABNORMAL HIGH (ref 36.0–46.0)
Hemoglobin: 15.4 g/dL — ABNORMAL HIGH (ref 12.0–15.0)
Immature Granulocytes: 0 %
Lymphocytes Relative: 26 %
Lymphs Abs: 1.6 10*3/uL (ref 0.7–4.0)
MCH: 30.6 pg (ref 26.0–34.0)
MCHC: 32.4 g/dL (ref 30.0–36.0)
MCV: 94.2 fL (ref 80.0–100.0)
Monocytes Absolute: 0.6 10*3/uL (ref 0.1–1.0)
Monocytes Relative: 10 %
Neutro Abs: 3.8 10*3/uL (ref 1.7–7.7)
Neutrophils Relative %: 62 %
Platelets: 200 10*3/uL (ref 150–400)
RBC: 5.04 MIL/uL (ref 3.87–5.11)
RDW: 13.2 % (ref 11.5–15.5)
WBC: 6.2 10*3/uL (ref 4.0–10.5)
nRBC: 0 % (ref 0.0–0.2)

## 2021-04-21 MED ORDER — FUROSEMIDE 10 MG/ML IJ SOLN
20.0000 mg | Freq: Once | INTRAMUSCULAR | Status: AC
  Administered 2021-04-21: 20 mg via INTRAVENOUS
  Filled 2021-04-21: qty 2

## 2021-04-21 MED ORDER — SODIUM CHLORIDE 0.9 % IV SOLN
375.0000 mg/m2 | Freq: Once | INTRAVENOUS | Status: AC
  Administered 2021-04-21: 700 mg via INTRAVENOUS
  Filled 2021-04-21: qty 50

## 2021-04-21 MED ORDER — SODIUM CHLORIDE 0.9 % IV SOLN: 375.0000 mg/m2 | Freq: Once | INTRAVENOUS | Status: DC

## 2021-04-21 MED ORDER — ACETAMINOPHEN 325 MG PO TABS
650.0000 mg | ORAL_TABLET | Freq: Once | ORAL | Status: AC
  Administered 2021-04-21: 650 mg via ORAL
  Filled 2021-04-21: qty 2

## 2021-04-21 MED ORDER — SODIUM CHLORIDE 0.9 % IV SOLN
Freq: Once | INTRAVENOUS | Status: AC
  Filled 2021-04-21: qty 250

## 2021-04-21 MED ORDER — DIPHENHYDRAMINE HCL 25 MG PO CAPS
25.0000 mg | ORAL_CAPSULE | Freq: Once | ORAL | Status: AC
  Administered 2021-04-21: 25 mg via ORAL
  Filled 2021-04-21: qty 1

## 2021-04-21 NOTE — Progress Notes (Signed)
Pt reports nausea the day of treatment but resolves within 1-2 days. Denies vomiting. Pt states she has motivation/desire to be more active but has very little energy.  ?

## 2021-04-21 NOTE — Progress Notes (Signed)
1000: Potassium 5.7MD team made aware.  ? 1021: per Tiffany RN per Dr. Grayland Ormond give Lasix 20 MG IV and proceed with Rituximab treatment as scheduled.  ? ?

## 2021-04-21 NOTE — Patient Instructions (Signed)
[facility] CANCER CTR AT [place]  Discharge Instructions: ?Thank you for choosing [facility] to provide your oncology and hematology care.  ?If you have a lab appointment with the [facility], please go directly to the [facility] and check in at the registration area. ? ?Wear comfortable clothing and clothing appropriate for easy access to any Portacath or PICC line.  ? ?We strive to give you quality time with your provider. You may need to reschedule your appointment if you arrive late (15 or more minutes).  Arriving late affects you and other patients whose appointments are after yours.  Also, if you miss three or more appointments without notifying the office, you may be dismissed from the clinic at the provider?s discretion.    ?  ?For prescription refill requests, have your pharmacy contact our office and allow 72 hours for refills to be completed.   ? ?Today you received the following chemotherapy and/or immunotherapy agents Rituximab     ?  ?To help prevent nausea and vomiting after your treatment, we encourage you to take your nausea medication as directed. ? ?BELOW ARE SYMPTOMS THAT SHOULD BE REPORTED IMMEDIATELY: ?*FEVER GREATER THAN 100.4 F (38 ?C) OR HIGHER ?*CHILLS OR SWEATING ?*NAUSEA AND VOMITING THAT IS NOT CONTROLLED WITH YOUR NAUSEA MEDICATION ?*UNUSUAL SHORTNESS OF BREATH ?*UNUSUAL BRUISING OR BLEEDING ?*URINARY PROBLEMS (pain or burning when urinating, or frequent urination) ?*BOWEL PROBLEMS (unusual diarrhea, constipation, pain near the anus) ?TENDERNESS IN MOUTH AND THROAT WITH OR WITHOUT PRESENCE OF ULCERS (sore throat, sores in mouth, or a toothache) ?UNUSUAL RASH, SWELLING OR PAIN  ?UNUSUAL VAGINAL DISCHARGE OR ITCHING  ? ?Items with * indicate a potential emergency and should be followed up as soon as possible or go to the Emergency Department if any problems should occur. ? ?Please show the CHEMOTHERAPY ALERT CARD or IMMUNOTHERAPY ALERT CARD at check-in to  the Emergency Department and triage nurse. ? ?Should you have questions after your visit or need to cancel or reschedule your appointment, please contact [facility] CANCER [place] AT [place]  [phone] and follow the prompts.  Office hours are 8:00 a.m. to 4:30 p.m. Monday - Friday. Please note that voicemails left after 4:00 p.m. may not be returned until the following business day.  We are closed weekends and major holidays. You have access to a nurse at all times for urgent questions. Please call the main number to the clinic [phone] and follow the prompts. ? ?For any non-urgent questions, you may also contact your provider using MyChart. We now offer e-Visits for anyone 38 and older to request care online for non-urgent symptoms. For details visit [email or web address]. ?  ?Also download the MyChart app! Go to the app store, search "MyChart", open the app, select [facility], and log in with your MyChart username and password. ? ?Due to Covid, a mask is required upon entering the hospital/clinic. If you do not have a mask, one will be given to you upon arrival. For doctor visits, patients may have 1 support person aged 72 or older with them. For treatment visits, patients cannot have anyone with them due to current Covid guidelines and our immunocompromised population.  ?

## 2021-04-22 ENCOUNTER — Encounter: Payer: Self-pay | Admitting: Oncology

## 2021-04-23 ENCOUNTER — Inpatient Hospital Stay: Payer: BC Managed Care – PPO | Admitting: Licensed Clinical Social Worker

## 2021-04-23 ENCOUNTER — Other Ambulatory Visit: Payer: Self-pay

## 2021-04-23 DIAGNOSIS — C8218 Follicular lymphoma grade II, lymph nodes of multiple sites: Secondary | ICD-10-CM

## 2021-04-23 NOTE — Progress Notes (Signed)
[facility] CSW Progress Note ? ?Clinical Social Worker met with patient to follow-up on previous counseling appointment. Patient stated she is feeling well today but is still experiencing fatigue.  Patient mentioned on Wednesday after her appointment w/ oncologist, she was very fatigued but was still abel to clearly state boundaries to her mother about her need for rest.  Patient stated she napped for three hours and then felt much better than before when she would force herself to speak with her mother when she was tired.  Patient stated she is also feeling less anxious because she as able to begin her disability application and had her interview with the special security office and it went well. CSW and patient discussed different coping strategies to assist with anxiety and to help support boundaries.  CSW and patient discussed Advance Directives and Living [place].  CSW gave patient Advance Directives package to take home and discuss with her spouse.  Patient scheduled appointment with CSW for 05/10/2021 at 2:00PM ? ? ? ?Melinda Bazaldua LCSW, LCSW ?

## 2021-05-07 ENCOUNTER — Ambulatory Visit
Admission: RE | Admit: 2021-05-07 | Discharge: 2021-05-07 | Disposition: A | Discharge status: Home / Self Care | Payer: BC Managed Care – PPO | Source: Ambulatory Visit | Attending: Family Medicine | Admitting: Family Medicine

## 2021-05-07 DIAGNOSIS — Z1231 Encounter for screening mammogram for malignant neoplasm of breast: Secondary | ICD-10-CM | POA: Diagnosis not present

## 2021-05-10 ENCOUNTER — Inpatient Hospital Stay: Payer: BC Managed Care – PPO | Admitting: Licensed Clinical Social Worker

## 2021-05-10 ENCOUNTER — Encounter: Payer: Self-pay | Admitting: Oncology

## 2021-05-10 NOTE — Progress Notes (Signed)
[facility] CSW Progress Note ? ?Clinical Social Worker met with patient to discuss adjustment issues. Note completed on session date 04/23/2021. ? ? ? ?Kinzlie Harney LCSW, LCSW ?

## 2021-05-11 ENCOUNTER — Encounter: Payer: Self-pay | Admitting: Emergency Medicine

## 2021-05-11 ENCOUNTER — Encounter: Payer: Self-pay | Admitting: Gastroenterology

## 2021-05-17 ENCOUNTER — Other Ambulatory Visit: Payer: Self-pay

## 2021-05-17 ENCOUNTER — Encounter: Payer: Self-pay | Admitting: Gastroenterology

## 2021-05-17 ENCOUNTER — Ambulatory Visit: Payer: BC Managed Care – PPO | Admitting: Anesthesiology

## 2021-05-17 ENCOUNTER — Ambulatory Visit
Admission: RE | Admit: 2021-05-17 | Discharge: 2021-05-17 | Disposition: A | Discharge status: Home / Self Care | Payer: BC Managed Care – PPO | Source: Ambulatory Visit | Attending: Gastroenterology | Admitting: Gastroenterology

## 2021-05-17 ENCOUNTER — Encounter
Admission: RE | Disposition: A | Discharge status: Home / Self Care | Payer: Self-pay | Source: Ambulatory Visit | Attending: Gastroenterology

## 2021-05-17 DIAGNOSIS — Z860100 Personal history of colon polyps, unspecified: Secondary | ICD-10-CM

## 2021-05-17 DIAGNOSIS — D124 Benign neoplasm of descending colon: Secondary | ICD-10-CM | POA: Insufficient documentation

## 2021-05-17 DIAGNOSIS — Z1211 Encounter for screening for malignant neoplasm of colon: Secondary | ICD-10-CM

## 2021-05-17 DIAGNOSIS — Z79899 Other long term (current) drug therapy: Secondary | ICD-10-CM | POA: Insufficient documentation

## 2021-05-17 DIAGNOSIS — Z9884 Bariatric surgery status: Secondary | ICD-10-CM | POA: Insufficient documentation

## 2021-05-17 DIAGNOSIS — D125 Benign neoplasm of sigmoid colon: Secondary | ICD-10-CM | POA: Diagnosis not present

## 2021-05-17 DIAGNOSIS — D123 Benign neoplasm of transverse colon: Secondary | ICD-10-CM | POA: Diagnosis not present

## 2021-05-17 DIAGNOSIS — K635 Polyp of colon: Secondary | ICD-10-CM

## 2021-05-17 DIAGNOSIS — D120 Benign neoplasm of cecum: Secondary | ICD-10-CM | POA: Diagnosis not present

## 2021-05-17 DIAGNOSIS — Z87891 Personal history of nicotine dependence: Secondary | ICD-10-CM | POA: Insufficient documentation

## 2021-05-17 DIAGNOSIS — E669 Obesity, unspecified: Secondary | ICD-10-CM | POA: Insufficient documentation

## 2021-05-17 DIAGNOSIS — Z6827 Body mass index (BMI) 27.0-27.9, adult: Secondary | ICD-10-CM | POA: Diagnosis not present

## 2021-05-17 HISTORY — PX: POLYPECTOMY: SHX5525

## 2021-05-17 HISTORY — DX: Follicular lymphoma, unspecified, unspecified site: C82.90

## 2021-05-17 HISTORY — PX: COLONOSCOPY WITH PROPOFOL: SHX5780

## 2021-05-17 SURGERY — COLONOSCOPY WITH PROPOFOL
Anesthesia: General | Site: Rectum

## 2021-05-17 MED ORDER — LACTATED RINGERS IV SOLN: INTRAVENOUS | Status: DC

## 2021-05-17 MED ORDER — OXYCODONE HCL 5 MG/5ML PO SOLN: 5.0000 mg | Freq: Once | ORAL | Status: DC | PRN

## 2021-05-17 MED ORDER — PROPOFOL 10 MG/ML IV BOLUS
INTRAVENOUS | Status: DC | PRN
  Administered 2021-05-17 (×4): 50 mg via INTRAVENOUS

## 2021-05-17 MED ORDER — STERILE WATER FOR IRRIGATION IR SOLN
Status: DC | PRN
  Administered 2021-05-17: 250 mL

## 2021-05-17 MED ORDER — LIDOCAINE HCL (CARDIAC) PF 100 MG/5ML IV SOSY
PREFILLED_SYRINGE | INTRAVENOUS | Status: DC | PRN
  Administered 2021-05-17: 30 mg via INTRAVENOUS

## 2021-05-17 MED ORDER — SODIUM CHLORIDE 0.9 % IV SOLN: INTRAVENOUS | Status: DC

## 2021-05-17 MED ORDER — OXYCODONE HCL 5 MG PO TABS: 5.0000 mg | ORAL_TABLET | Freq: Once | ORAL | Status: DC | PRN

## 2021-05-17 SURGICAL SUPPLY — 8 items
GOWN CVR UNV OPN BCK APRN NK (MISCELLANEOUS) ×4 IMPLANT
GOWN ISOL THUMB LOOP REG UNIV (MISCELLANEOUS) ×6
KIT PRC NS LF DISP ENDO (KITS) ×2 IMPLANT
KIT PROCEDURE OLYMPUS (KITS) ×3
MANIFOLD NEPTUNE II (INSTRUMENTS) ×3 IMPLANT
SNARE COLD EXACTO (MISCELLANEOUS) ×1 IMPLANT
TRAP ETRAP POLY (MISCELLANEOUS) ×1 IMPLANT
WATER STERILE IRR 250ML POUR (IV SOLUTION) ×3 IMPLANT

## 2021-05-17 NOTE — H&P (Signed)
? ?Lucilla Lame, MD [facility] ?[address] ?[place], [place] [address] ?[phone] ?Fax : [phone] ? ?Primary Care Physician:  [name], MD ?Primary Gastroenterologist:  Dr. Allen Norris ? ?Pre-Procedure History & Physical: ?HPI:  Melinda Hall is a 61 y.o. female is here for an colonoscopy. ?  ?Past Medical History:  ?Diagnosis Date  ? Cancer [facility])   ? Dermoid tumor   ? hx of--left ovary  ? Follicular lymphoma ([place])   ? Gastric bypass status for obesity 2004  ? s/p roux-en-y  ? Generalized headaches   ? frequent  ? Heart murmur   ? History of cardiac murmur   ? Intraductal papilloma of breast, left 06/18/2017  ? 05/2017 - s/p excision 07/2017  ? Osteoporosis 03/2016  ? DEXA -2.6 spine  ? Salivary gland stone   ? x 2  ? Smoker   ? ? ?Past Surgical History:  ?Procedure Laterality Date  ? ABDOMINAL HYSTERECTOMY    ? only ovary was removed  ? ANTERIOR CERVICAL DECOMP/DISCECTOMY FUSION  12/2016  ? Pool  ? APPENDECTOMY  1980  ? BILATERAL OOPHORECTOMY  ~1992  ? for dermoid ovarian cysts  ? BREAST BIOPSY Left 06/13/2017  ? papilloma with excision 07/2017  ? BREAST LUMPECTOMY WITH NEEDLE LOCALIZATION Left 08/10/2017  ? intraductal papilloma - Pabon, Marjory Lies, MD  ? CHOLECYSTECTOMY  2000s  ? COLONOSCOPY WITH PROPOFOL N/A 11/27/2015  ? TA rpt 5 yrs Lucilla Lame, MD)  ? GASTRIC BYPASS  2004  ? roux en y [facility])  ? POLYPECTOMY  11/27/2015  ? Procedure: POLYPECTOMY;  Surgeon: Lucilla Lame, MD;  Location: [place];  Service: Endoscopy;;  ? REDUCTION MAMMAPLASTY Bilateral 1990's  ? SALIVARY GLAND SURGERY    ? SALIVARY STONE REMOVAL    ? ? ?Prior to Admission medications   ?Medication Sig Start Date End Date Taking? Authorizing Provider  ?calcium carbonate (OS-CAL) 600 MG TABS tablet Take 600 mg by mouth daily with breakfast.   Yes [provider]  ?Cholecalciferol (VITAMIN D3) 1000 units CAPS Take 3 capsules (3,000 Units total) by mouth daily. 10/19/16  Yes [name], MD  ?ferrous sulfate 325  (65 FE) MG tablet Take 1 tablet (325 mg total) by mouth every Monday, Wednesday, and Friday. 12/05/18  Yes [name], MD  ?Magnesium 250 MG TABS Take 1 tablet (250 mg total) by mouth daily. 02/25/20  Yes [name], MD  ?PROLIA 60 MG/ML SOSY injection INJECT '60MG'$  UNDER THE SKIN EVERY 6 MONTHS 03/24/21  Yes [name], MD  ?vitamin B-12 (CYANOCOBALAMIN) 1000 MCG tablet Take 1 tablet (1,000 mcg total) by mouth daily. 10/22/16  Yes [name], MD  ?zinc gluconate 50 MG tablet Take 1 tablet (50 mg total) by mouth daily. 02/25/20  Yes [name], MD  ? ? ?Allergies as of 04/01/2021 - Review Complete 04/01/2021  ?Allergen Reaction Noted  ? Boniva [ibandronic acid] Other (See Comments) 09/11/2019  ? ? ?Family History  ?Problem Relation Age of Onset  ? Cancer Father 77  ?     leukemia  ? Hypertension Mother   ? Diabetes Neg Hx   ? Stroke Neg Hx   ? Coronary artery disease Neg Hx   ? Breast cancer Neg Hx   ? ? ?Social History  ? ?Socioeconomic History  ? Marital status: Married  ?  Spouse name: Not on file  ? Number of children: Not on file  ? Years of education: Not on file  ? Highest education level: Not on  file  ?Occupational History  ? Not on file  ?Tobacco Use  ? Smoking status: Former  ?  Packs/day: 0.25  ?  Years: 20.00  ?  Pack years: 5.00  ?  Types: Cigarettes  ?  Quit date: 10/31/2012  ?  Years since quitting: 8.5  ? Smokeless tobacco: Never  ?Vaping Use  ? Vaping Use: Never used  ?Substance and Sexual Activity  ? Alcohol use: Not Currently  ? Drug use: No  ? Sexual activity: Not on file  ?Other Topics Concern  ? Not on file  ?Social History Narrative  ? Caffeine: 2 cups coffee/day  ? Lives with husband [name]) and daughter, 3 dogs, 1 ferret  ? 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 From [place]  ? Activity: no regular exercise, walks some at work  ? Diet: good water, fruits/vegetables daily  ? ?Social Determinants of Health  ? ?Financial Resource Strain: Medium Risk  ?  Difficulty of Paying Living Expenses: Somewhat hard  ?Food Insecurity: No Food Insecurity  ? Worried About Charity fundraiser in the Last Year: Never true  ? Ran Out of Food in the Last Year: Never true  ?Transportation Needs: No Transportation Needs  ? Lack of Transportation (Medical): No  ? Lack of Transportation (Non-Medical): No  ?Physical Activity: Inactive  ? Days of Exercise per Week: 0 days  ? Minutes of Exercise per Session: 0 min  ?Stress: Stress Concern Present  ? Feeling of Stress : To some extent  ?Social Connections: Moderately Isolated  ? Frequency of Communication with Friends and Family: Three times a week  ? Frequency of Social Gatherings with Friends and Family: More than three times a week  ? Attends Religious Services: Never  ? Active Member of Clubs or Organizations: Not on file  ? Attends Archivist Meetings: Never  ? Marital Status: Married  ?Intimate Partner Violence: Unknown  ? Fear of Current or Ex-Partner: No  ? Emotionally Abused: No  ? Physically Abused: No  ? Sexually Abused: Not on file  ? ? ?Review of Systems: ?See HPI, otherwise negative ROS ? ?Physical Exam: ?BP 111/70   Pulse (!) 58   Temp (!) 97.2 ?F (36.2 ?C) (Temporal)   Ht '5\' 5"'$  (1.651 m)   Wt 74.4 kg   SpO2 98%   BMI 27.29 kg/m?  ?General:   Alert,  pleasant and cooperative in NAD ?Head:  Normocephalic and atraumatic. ?Neck:  Supple; no masses or thyromegaly. ?Lungs:  Clear throughout to auscultation.    ?Heart:  Regular rate and rhythm. ?Abdomen:  Soft, nontender and nondistended. Normal bowel sounds, without guarding, and without rebound.   ?Neurologic:  Alert and  oriented x4;  grossly normal neurologically. ? ?Impression/Plan: ?Melinda Hall is here for an colonoscopy to be performed for a history of adenomatous polyps on 2017 ? ?Risks, benefits, limitations, and alternatives regarding  colonoscopy have been reviewed with the patient.  Questions have been answered.  All parties agreeable. ? ? ?Lucilla Lame, MD  05/17/2021, 8:24 AM ?

## 2021-05-17 NOTE — Anesthesia Postprocedure Evaluation (Signed)
Anesthesia Post Note ? ?Patient: Melinda Hall ? ?Procedure(s) Performed: COLONOSCOPY WITH PROPOFOL ([place]) ?POLYPECTOMY ([facility]) ? ? ?  ?Patient location during evaluation: PACU ?Anesthesia Type: General ?Level of consciousness: awake and alert ?Pain management: pain level controlled ?Vital Signs Assessment: post-procedure vital signs reviewed and stable ?Respiratory status: spontaneous breathing, nonlabored ventilation, respiratory function stable and patient connected to nasal cannula oxygen ?Cardiovascular status: blood pressure returned to baseline and stable ?Postop Assessment: no apparent nausea or vomiting ?Anesthetic complications: no ? ? ?No notable events documented. ? ?Fidel Levy ? ? ? ? ? ?

## 2021-05-17 NOTE — Transfer of Care (Signed)
Immediate Anesthesia Transfer of Care Note ? ?Patient: Melinda Hall ? ?Procedure(s) Performed: COLONOSCOPY WITH PROPOFOL (Rect[facility]) ?POLYPECTOMY (Rectum) ? ?Patient Location: PACU ? ?Anesthesia Type: General ? ?Level of Consciousness: awake, alert  and patient cooperative ? ?Airway and Oxygen Therapy: Patient Spontanous Breathing and Patient connected to supplemental oxygen ? ?Post-op Assessment: Post-op Vital signs reviewed, Patient's Cardiovascular Status Stable, Respiratory Function Stable, Patent Airway and No signs of Nausea or vomiting ? ?Post-op Vital Signs: Reviewed and stable ? ?Complications: No notable events documented. ? ?

## 2021-05-17 NOTE — Anesthesia Preprocedure Evaluation (Signed)
Anesthesia Evaluation  ?Patient identified by MRN, date of birth, ID band ?Patient awake ? ? ? ?Reviewed: ?NPO status  ? ?History of Anesthesia Complications ?Negative for: history of anesthetic complications ? ?Airway ?Mallampati: II ? ?TM Distance: >3 FB ?Neck ROM: full ? ? ? Dental ? ?(+) Missing ?  ?Pulmonary ?neg pulmonary ROS, former smoker,  ?  ?Pulmonary exam normal ? ? ? ? ? ? ? Cardiovascular ?Exercise Tolerance: Good ?negative cardio ROS ?Normal cardiovascular exam ? ? ?  ?Neuro/Psych ? Headaches, ACDF; ?negative psych ROS  ? GI/Hepatic ?Neg liver ROS, S/p gastric bypass : 2004 ? ?  ?Endo/Other  ?negative endocrine ROS ? Renal/GU ?negative Renal ROS  ?negative genitourinary ?  ?Musculoskeletal ? ? Abdominal ?  ?Peds ? Hematology ? ?(+) Blood dyscrasia, anemia , Recurrent stage III follicular lymphoma > Rituxan. ? ?   ?Anesthesia Other Findings ? ? Reproductive/Obstetrics ? ?  ? ? ? ? ? ? ? ? ? ? ? ? ? ?  ?  ? ? ? ? ? ? ? ? ?Anesthesia Physical ?Anesthesia Plan ? ?ASA: 3 ? ?Anesthesia Plan: General  ? ?Post-op Pain Management:   ? ?Induction:  ? ?PONV Risk Score and Plan: 3 and TIVA, Propofol infusion and Ondansetron ? ?Airway Management Planned:  ? ?Additional Equipment:  ? ?Intra-op Plan:  ? ?Post-operative Plan:  ? ?Informed Consent: I have reviewed the patients History and Physical, chart, labs and discussed the procedure including the risks, benefits and alternatives for the proposed anesthesia with the patient or authorized representative who has indicated his/her understanding and acceptance.  ? ? ? ? ? ?Plan Discussed with: CRNA ? ?Anesthesia Plan Comments:   ? ? ? ? ? ? ?Anesthesia Quick Evaluation ? ?

## 2021-05-17 NOTE — Op Note (Signed)
[facility] ?Gastroenterology ?Patient Name: Melinda Hall ?Procedure Date: 05/17/2021 8:53 AM ?MRN: [id number] ?Account #: [id number] ?Date of Birth: [birth year] ?Admit Type: Outpatient ?Age: 61 ?Room: [facility] OR ROOM 01 ?Gender: Female ?Note Status: Finalized ?Instrument Name: [id number] ?Procedure:             Colonoscopy ?Indications:           High risk colon cancer surveillance: Personal history  ?                       of colonic polyps ?Providers:             Lucilla Lame MD, MD ?Referring MD:          [name] (Referring MD) ?Medicines:             Propofol per Anesthesia ?Complications:         No immediate complications. ?Procedure:             Pre-Anesthesia Assessment: ?                       - Prior to the procedure, a History and Physical was  ?                       performed, and patient medications and allergies were  ?                       reviewed. The patient's tolerance of previous  ?                       anesthesia was also reviewed. The risks and benefits  ?                       of the procedure and the sedation options and risks  ?                       were discussed with the patient. All questions were  ?                       answered, and informed consent was obtained. Prior  ?                       Anticoagulants: The patient has taken no previous  ?                       anticoagulant or antiplatelet agents. ASA Grade  ?                       Assessment: II - A patient with mild systemic disease.  ?                       After reviewing the risks and benefits, the patient  ?                       was deemed in satisfactory condition to undergo the  ?                       procedure. ?  After obtaining informed consent, the colonoscope was  ?                       passed under direct vision. Throughout the procedure,  ?                       the patient's blood pressure, pulse, and oxygen  ?                       saturations were monitored  continuously. The was  ?                       introduced through the anus and advanced to the the  ?                       cecum, identified by appendiceal orifice and ileocecal  ?                       valve. The colonoscopy was performed without  ?                       difficulty. The patient tolerated the procedure well.  ?                       The quality of the bowel preparation was good. ?Findings: ?     The perianal and digital rectal examinations were normal. ?     A 4 mm polyp was found in the cecum. The polyp was sessile. The polyp  ?     was removed with a cold snare. Resection and retrieval were complete. ?     Two sessile polyps were found in the transverse colon. The polyps were 4  ?     to 6 mm in size. These polyps were removed with a cold snare. Resection  ?     and retrieval were complete. ?     A 4 mm polyp was found in the descending colon. The polyp was sessile.  ?     The polyp was removed with a cold snare. Resection and retrieval were  ?     complete. ?     A 4 mm polyp was found in the sigmoid colon. The polyp was sessile. The  ?     polyp was removed with a cold snare. Resection and retrieval were  ?     complete. ?Impression:            - One 4 mm polyp in the cecum, removed with a cold  ?                       snare. Resected and retrieved. ?                       - Two 4 to 6 mm polyps in the transverse colon,  ?                       removed with a cold snare. Resected and retrieved. ?                       - One 4 mm polyp in the descending colon, removed with  ?  a cold snare. Resected and retrieved. ?                       - One 4 mm polyp in the sigmoid colon, removed with a  ?                       cold snare. Resected and retrieved. ?Recommendation:        - Discharge patient to home. ?                       - Resume previous diet. ?                       - Continue present medications. ?                       - Await pathology results. ?                        - Repeat colonoscopy in 3 years for surveillance. ?Procedure Code(s):     --- Professional --- ?                       [phone], Colonoscopy, flexible; with removal of  ?                       tumor(s), polyp(s), or other lesion(s) by snare  ?                       technique ?Diagnosis Code(s):     --- Professional --- ?                       Z86.010, Personal history of colonic polyps ?                       K63.5, Polyp of colon ?CPT copyright 2019 American Medical Association. All rights reserved. ?The codes documented in this report are preliminary and upon coder review may  ?be revised to meet current compliance requirements. ?Lucilla Lame MD, MD ?05/17/2021 9:25:30 AM ?This report has been signed electronically. ?Number of Addenda: 0 ?Note Initiated On: 05/17/2021 8:53 AM ?Scope Withdrawal Time: 0 hours 11 minutes 58 seconds  ?Total Procedure Duration: 0 hours 19 minutes 12 seconds  ?Estimated Blood Loss:  Estimated blood loss: none. ?     [facility] ?

## 2021-05-18 ENCOUNTER — Encounter: Payer: Self-pay | Admitting: Gastroenterology

## 2021-05-19 LAB — SURGICAL PATHOLOGY

## 2021-05-20 ENCOUNTER — Encounter: Payer: Self-pay | Admitting: Gastroenterology

## 2021-05-22 ENCOUNTER — Encounter: Payer: Self-pay | Admitting: Family Medicine

## 2021-05-31 ENCOUNTER — Ambulatory Visit: Payer: BC Managed Care – PPO

## 2021-06-02 ENCOUNTER — Ambulatory Visit
Admission: RE | Admit: 2021-06-02 | Discharge: 2021-06-02 | Disposition: A | Discharge status: Home / Self Care | Payer: BC Managed Care – PPO | Source: Ambulatory Visit | Attending: Oncology | Admitting: Oncology

## 2021-06-02 DIAGNOSIS — Z9889 Other specified postprocedural states: Secondary | ICD-10-CM | POA: Diagnosis not present

## 2021-06-02 DIAGNOSIS — Z9049 Acquired absence of other specified parts of digestive tract: Secondary | ICD-10-CM | POA: Diagnosis not present

## 2021-06-02 DIAGNOSIS — C8290 Follicular lymphoma, unspecified, unspecified site: Secondary | ICD-10-CM | POA: Diagnosis not present

## 2021-06-02 DIAGNOSIS — C8218 Follicular lymphoma grade II, lymph nodes of multiple sites: Secondary | ICD-10-CM | POA: Insufficient documentation

## 2021-06-02 DIAGNOSIS — D1800 Hemangioma unspecified site: Secondary | ICD-10-CM | POA: Diagnosis not present

## 2021-06-02 LAB — POCT I-STAT CREATININE: Creatinine, Ser: 0.7 mg/dL (ref 0.44–1.00)

## 2021-06-02 MED ORDER — IOHEXOL 300 MG/ML  SOLN
100.0000 mL | Freq: Once | INTRAMUSCULAR | Status: AC | PRN
Start: 2021-06-02 — End: 2021-06-02
  Administered 2021-06-02: 100 mL via INTRAVENOUS

## 2021-06-03 ENCOUNTER — Ambulatory Visit: Payer: BC Managed Care – PPO | Admitting: Nurse Practitioner

## 2021-06-03 ENCOUNTER — Inpatient Hospital Stay: Payer: BC Managed Care – PPO | Attending: Oncology | Admitting: Licensed Clinical Social Worker

## 2021-06-03 ENCOUNTER — Other Ambulatory Visit: Payer: BC Managed Care – PPO

## 2021-06-03 DIAGNOSIS — E041 Nontoxic single thyroid nodule: Secondary | ICD-10-CM | POA: Insufficient documentation

## 2021-06-03 DIAGNOSIS — D751 Secondary polycythemia: Secondary | ICD-10-CM | POA: Insufficient documentation

## 2021-06-03 DIAGNOSIS — Z5112 Encounter for antineoplastic immunotherapy: Secondary | ICD-10-CM | POA: Insufficient documentation

## 2021-06-03 DIAGNOSIS — C8200 Follicular lymphoma grade I, unspecified site: Secondary | ICD-10-CM

## 2021-06-03 DIAGNOSIS — C8223 Follicular lymphoma grade III, unspecified, intra-abdominal lymph nodes: Secondary | ICD-10-CM | POA: Insufficient documentation

## 2021-06-03 NOTE — Progress Notes (Signed)
[place] CSW Progress Note ? ?Clinical Social Worker met with patient to CAT scan results and adjustment issues. Patient reported her Cat scan results did not have the results she was hoping for. Patient stated the tumor is small but not much smaller, and not as small as it got after the very first round of treatment. Patient stated she is having trouble with this news, although she is glad to know the tumor did not grow.  Patient stated she is cycling through emotions, and she will be feeling fine and then she remembers the tumor is still there and did not shrink significantly.  CSW and patient discussed different ways to focus on what she could do and how shift the thoughts on what will enhance her daily living. Patient stated she was comforted to know there was a plan of action moving forward in treatment.  CSW and patient discussed how to manage anxiety due to results, and how to speak with her mother and daughter about the results.  CSW and patient discussed continuing to make travel plans in the fall and ways to create back-up plans, if there was a need to adjust the plans due to treatment.  Patient will continue working on mindfulness and scheduled an appointment for May 24th at 1:00PM ? ? ?Lovell Roe LCSW, LCSW ?

## 2021-06-16 ENCOUNTER — Encounter: Payer: Self-pay | Admitting: Oncology

## 2021-06-20 NOTE — Progress Notes (Signed)
[place]  [phone]) [phone] [phone]) [phone]  ID: Melinda Hall OB: [birth year]  MR#: [id number]  OMV#:[id number]  Patient Care Team: [name], MD as PCP - General (Family Medicine) [name], MD as Consulting Physician (Oncology) [name], MD as Consulting Physician (Neurosurgery)  CHIEF COMPLAINT: Recurrent stage III follicular lymphoma, grade 1/2.    INTERVAL HISTORY: Patient returns to clinic today for further evaluation, discussion of her imaging results, and continuation of maintenance Rituxan.  She continues to have chronic weakness and fatigue and was recently started on treatment for depression.  She otherwise feels well.  She denies any fevers, night sweats, or weight loss.  She does not complaining of any abdominal discomfort today.  She has no neurologic complaints. She has no chest pain, shortness of breath, cough, or hemoptysis.  She denies any nausea, vomiting, constipation, or diarrhea.  She has no urinary complaints.  Patient offers no further specific complaints today.  REVIEW OF SYSTEMS:   Review of Systems  Constitutional:  Positive for malaise/fatigue. Negative for diaphoresis, fever and weight loss.  Respiratory: Negative.  Negative for cough, hemoptysis and shortness of breath.   Cardiovascular: Negative.  Negative for chest pain and leg swelling.  Gastrointestinal: Negative.  Negative for abdominal pain.  Genitourinary: Negative.  Negative for dysuria.  Musculoskeletal: Negative.  Negative for back pain and myalgias.  Skin: Negative.  Negative for rash.  Neurological:  Positive for weakness. Negative for tingling, sensory change, focal weakness and headaches.  Psychiatric/Behavioral:  Positive for depression. The patient is not nervous/anxious.    As per HPI. Otherwise, a complete review of systems is negative.  PAST MEDICAL HISTORY: Past Medical History:  Diagnosis Date   Cancer ([place])    Dermoid tumor     hx of--left ovary   Follicular lymphoma ([place])    Gastric bypass status for obesity 2004   s/p roux-en-y   Generalized headaches    frequent   Heart murmur    History of cardiac murmur    Intraductal papilloma of breast, left 06/18/2017   05/2017 - s/p excision 07/2017   Osteoporosis 03/2016   DEXA -2.6 spine   Salivary gland stone    x 2   Smoker     PAST SURGICAL HISTORY: Past Surgical History:  Procedure Laterality Date   ABDOMINAL HYSTERECTOMY     only ovary was removed   ANTERIOR CERVICAL DECOMP/DISCECTOMY FUSION  12/2016   [place]  ~1992   for dermoid ovarian cysts   BREAST BIOPSY Left 06/13/2017   papilloma with excision 07/2017   BREAST LUMPECTOMY WITH NEEDLE LOCALIZATION Left 08/10/2017   intraductal papilloma - Caroleen Hamman F, MD   CHOLECYSTECTOMY  2000s   COLONOSCOPY WITH PROPOFOL N/A 11/27/2015   TA rpt 5 yrs Lucilla Lame, MD)   COLONOSCOPY WITH PROPOFOL N/A 05/17/2021   TAs, SSPs, rpt 3 yrs Allen Norris, Darren, MD)   GASTRIC BYPASS  2004   roux en y [facility])   POLYPECTOMY  11/27/2015   Procedure: POLYPECTOMY;  Surgeon: Lucilla Lame, MD;  Location: [facility];  Service: Endoscopy;;   POLYPECTOMY  05/17/2021   Procedure: POLYPECTOMY;  Surgeon: Lucilla Lame, MD;  Location: [facility];  Service: Endoscopy;;   REDUCTION MAMMAPLASTY Bilateral 1990's   SALIVARY GLAND SURGERY     SALIVARY STONE REMOVAL      FAMILY HISTORY: Family History  Problem Relation Age of Onset   Cancer Father 76  leukemia   Hypertension Mother    Diabetes Neg Hx    Stroke Neg Hx    Coronary artery disease Neg Hx    Breast cancer Neg Hx     ADVANCED DIRECTIVES (Y/N):  N  HEALTH MAINTENANCE: Social History   Tobacco Use   Smoking status: Former    Packs/day: 0.25    Years: 20.00    Pack years: 5.00    Types: Cigarettes    Quit date: 10/31/2012    Years since quitting: 8.6   Smokeless tobacco: Never  Vaping Use    Vaping Use: Never used  Substance Use Topics   Alcohol use: Not Currently   Drug use: No     Colonoscopy:  PAP:  Bone density:  Lipid panel:  Allergies  Allergen Reactions   Boniva [Ibandronic Acid] Other (See Comments)    Diffuse body aches    Current Outpatient Medications  Medication Sig Dispense Refill   buPROPion ER (WELLBUTRIN SR) 100 MG 12 hr tablet Take 1 tablet (100 mg total) by mouth in the morning. 30 tablet 6   calcium carbonate (OS-CAL) 600 MG TABS tablet Take 600 mg by mouth daily with breakfast.     Cholecalciferol (VITAMIN D3) 1000 units CAPS Take 3 capsules (3,000 Units total) by mouth daily.     ferrous sulfate 325 (65 FE) MG tablet Take 1 tablet (325 mg total) by mouth every Monday, Wednesday, and Friday.     Magnesium 250 MG TABS Take 1 tablet (250 mg total) by mouth daily.  0   PROLIA 60 MG/ML SOSY injection INJECT '60MG'$  UNDER THE SKIN EVERY 6 MONTHS 1 mL 0   vitamin B-12 (CYANOCOBALAMIN) 1000 MCG tablet Take 1 tablet (1,000 mcg total) by mouth daily.     zinc gluconate 50 MG tablet Take 1 tablet (50 mg total) by mouth daily.     No current facility-administered medications for this visit.    OBJECTIVE: Vitals:   06/23/21 0921  BP: 118/82  Pulse: 64  Resp: 18  Temp: 97.7 F (36.5 C)     Body mass index is 27.27 kg/m.    ECOG FS:0 - Asymptomatic  General: Well-developed, well-nourished, no acute distress. Eyes: Pink conjunctiva, anicteric sclera. HEENT: Normocephalic, moist mucous membranes. Lungs: No audible wheezing or coughing. Heart: Regular rate and rhythm. Abdomen: Soft, nontender, no obvious distention. Musculoskeletal: No edema, cyanosis, or clubbing. Neuro: Alert, answering all questions appropriately. Cranial nerves grossly intact. Skin: No rashes or petechiae noted. Psych: Normal affect.   LAB RESULTS:  Lab Results  Component Value Date   NA 137 06/23/2021   K 4.8 06/23/2021   CL 105 06/23/2021   CO2 28 06/23/2021   GLUCOSE  98 06/23/2021   BUN 22 06/23/2021   CREATININE 0.65 06/23/2021   CALCIUM 9.2 06/23/2021   PROT 6.9 06/23/2021   ALBUMIN 4.2 06/23/2021   AST 32 06/23/2021   ALT 43 06/23/2021   ALKPHOS 42 06/23/2021   BILITOT 0.9 06/23/2021   GFRNONAA >60 06/23/2021   GFRAA >60 07/10/2013    Lab Results  Component Value Date   WBC 5.9 06/23/2021   NEUTROABS 3.4 06/23/2021   HGB 15.1 (H) 06/23/2021   HCT 45.2 06/23/2021   MCV 93.8 06/23/2021   PLT 197 06/23/2021     STUDIES: DG Tibia/Fibula Right  Result Date: 06/22/2021 CLINICAL DATA:  Right leg pain EXAM: RIGHT TIBIA AND FIBULA - 2 VIEW COMPARISON:  None Available. FINDINGS: There is no evidence of fracture or  other focal bone lesions. Soft tissues are unremarkable. IMPRESSION: Negative. Electronically Signed   By: Fidela Salisbury M.D.   On: 06/22/2021 00:25   CT CHEST ABDOMEN PELVIS W CONTRAST  Result Date: 06/02/2021 CLINICAL DATA:  Non-Hodgkin's lymphoma restaging, follicular lymphoma, ongoing chemotherapy * Tracking Code: BO * EXAM: CT CHEST, ABDOMEN, AND PELVIS WITH CONTRAST TECHNIQUE: Multidetector CT imaging of the chest, abdomen and pelvis was performed following the standard protocol during bolus administration of intravenous contrast. RADIATION DOSE REDUCTION: This exam was performed according to the departmental dose-optimization program which includes automated exposure control, adjustment of the mA and/or kV according to patient size and/or use of iterative reconstruction technique. CONTRAST:  150m OMNIPAQUE IOHEXOL 300 MG/ML SOLN, additional oral enteric contrast COMPARISON:  CT abdomen pelvis, 03/15/2021, PET-CT, 08/24/2020 FINDINGS: CT CHEST FINDINGS Cardiovascular: No significant vascular findings. Normal heart size. No pericardial effusion. Mediastinum/Nodes: No enlarged mediastinal, hilar, or axillary lymph nodes. Thymic remnant in the anterior mediastinum. Thyroid gland, trachea, and esophagus demonstrate no significant findings.  Lungs/Pleura: Lungs are clear. No pleural effusion or pneumothorax. Musculoskeletal: No chest wall mass or suspicious osseous lesions identified. CT ABDOMEN PELVIS FINDINGS Hepatobiliary: No focal liver abnormality is seen. Status post cholecystectomy. Mild postoperative biliary ductal dilatation. Pancreas: Unremarkable. No pancreatic ductal dilatation or surrounding inflammatory changes. Spleen: Normal in size without significant abnormality. Adrenals/Urinary Tract: Adrenal glands are unremarkable. Kidneys are normal, without renal calculi, solid lesion, or hydronephrosis. Bladder is unremarkable. Stomach/Bowel: Status post Roux type gastric bypass. Appendix is not clearly visualized. No evidence of bowel wall thickening, distention, or inflammatory changes. Vascular/Lymphatic: No significant vascular findings are present. Slight interval decrease in size of a retroperitoneal soft tissue mass or lymph node underlying the vena cava at the level of the renal veins, measuring 3.7 x 2.3 cm, previously 4.1 x 2.8 cm (series 2, image 56). Reproductive: No mass or other abnormality. Other: No abdominal wall hernia or abnormality. No ascites. Musculoskeletal: No acute osseous findings. Incidental, benign trabeculated vertebral body hemangioma of L3 (series 6, image 84). IMPRESSION: 1. Slight interval decrease in size of a retroperitoneal soft tissue mass or lymph node underlying the vena cava at the level of the renal veins. Findings are consistent with treatment response. 2. No other lymphadenopathy or other evidence of lymphoma in the chest, abdomen, or pelvis. 3. Normal spleen. Electronically Signed   By: ADelanna AhmadiM.D.   On: 06/02/2021 15:17    ONCOLOGY HISTORY: Retroperitoneal lymph node biopsy on April 21, 2020 confirmed the diagnosis. PET scan on April 14, 2020 revealed a hypermetabolic right sided retroperitoneal nodal mass along with hypermetabolic mesenteric adenopathy.  Patient also noted to have bilateral  axillary adenopathy.  Although patient was asymptomatic and active surveillance was offered, she elected to pursue treatment and received IV Rituxan weekly x4 completing on May 28, 2020.  Repeat PET scan from August 24, 2020 reviewed independently with significant treatment response, although likely residual disease.  Patient previously declined maintenance Rituxan.   ASSESSMENT: Recurrent stage III follicular lymphoma, grade 1/2.    PLAN:    1.  Recurrent stage III follicular lymphoma, grade 1/2:  Repeat imaging on March 15, 2021 reviewed independently with progression of disease.  Patient is also symptomatic.  She has agreed to reinitiate weekly Rituxan x4 followed by maintenance Rituxan every 8 weeks for up to 2 years.  Can also consider Zevalin in the near future.  Repeat imaging on Jun 02, 2021 reviewed independently and reported as above with continued improvement of  disease burden.  Patient has now completed 4 weekly cycles of Rituxan.  Proceed with cycle 1 of maintenance Rituxan.  Return to clinic in 8 weeks for further evaluation and consideration of cycle 2.  Will reimage in approximately November 2023.   2.  Hypermetabolic thyroid nodule: Previously, biopsy was negative for malignancy. 3.  Elevated liver enzymes: Resolved. 4.  Polycythemia: Chronic and unchanged.  Patient's hemoglobin is 15.1 today. 5.  Myalgias: Patient does not complain of this today.  Continue ibuprofen as needed. 6.  Hyperkalemia: Resolved. 7.  Depression: Continue monitoring and treatment per primary care.   Patient expressed understanding and was in agreement with this plan. She also understands that She can call clinic at any time with any questions, concerns, or complaints.    Cancer Staging  Follicular lymphoma ([place]) Staging form: Hodgkin and Non-Hodgkin Lymphoma, AJCC 8th Edition - Clinical stage from 7/34/2876: Stage III (Follicular lymphoma) - Signed by [name], MD on 04/29/2020 Stage prefix:  Initial diagnosis   [name], MD   06/25/2021 6:30 AM

## 2021-06-21 ENCOUNTER — Encounter: Payer: Self-pay | Admitting: Family Medicine

## 2021-06-21 ENCOUNTER — Ambulatory Visit (INDEPENDENT_AMBULATORY_CARE_PROVIDER_SITE_OTHER)
Admission: RE | Admit: 2021-06-21 | Discharge: 2021-06-21 | Disposition: A | Discharge status: Home / Self Care | Payer: BC Managed Care – PPO | Source: Ambulatory Visit | Attending: Family Medicine | Admitting: Family Medicine

## 2021-06-21 ENCOUNTER — Ambulatory Visit (INDEPENDENT_AMBULATORY_CARE_PROVIDER_SITE_OTHER): Payer: BC Managed Care – PPO | Admitting: Family Medicine

## 2021-06-21 VITALS — BP 122/84 | HR 58 | Temp 97.7°F | Ht 65.0 in | Wt 166.1 lb

## 2021-06-21 DIAGNOSIS — M79604 Pain in right leg: Secondary | ICD-10-CM

## 2021-06-21 DIAGNOSIS — F4321 Adjustment disorder with depressed mood: Secondary | ICD-10-CM | POA: Insufficient documentation

## 2021-06-21 DIAGNOSIS — C8200 Follicular lymphoma grade I, unspecified site: Secondary | ICD-10-CM

## 2021-06-21 MED ORDER — BUPROPION HCL ER (SR) 100 MG PO TB12: 100.0000 mg | ORAL_TABLET | Freq: Every morning | ORAL | 6 refills | Status: DC

## 2021-06-21 NOTE — Assessment & Plan Note (Signed)
Reviewed recent lymphoma treatment course, pending first maintenance treatment later this week. Appreciate oncology care of patient.

## 2021-06-21 NOTE — Assessment & Plan Note (Signed)
Point tender medial to head of fibula, as well as tender along posterior lateral malleolus.  Overall benign exam although some decreased pedal pulses noted on exam.  Anticipate pain may be related to venous insufficiency - discussed trial of 20-30 mmHg knee high compression stockings, update if trouble tolerating or worsening symptoms.  Will also check R leg films and consider ABIs pending results of above.  In interim, Rx tramadol for breakthrough pain.  voltaren was ineffective.

## 2021-06-21 NOTE — Patient Instructions (Addendum)
Comienze wellbutrin SL '100mg'$  diarios en la maana.  Placa de pierna derecha hoy.  Trate medias de compresion para dolor de pierna derecha. Si empeora, pare y dejeme saber para evaluar circulacion de las piernas. Vaya a tienda de equipos medicos y busque compresion 20-30 mmHg.  Regresar en 1.5 meses para seguimiento.  La remitiremos a Risk analyst.

## 2021-06-21 NOTE — Progress Notes (Signed)
Patient ID: Melinda Hall, female    DOB: 27-Mar-1960, 61 y.o.   MRN: [id number]  This visit was conducted in person.  BP 122/84   Pulse (!) 58   Temp 97.7 F (36.5 C) (Temporal)   Ht '5\' 5"'$  (1.651 m)   Wt 166 lb 2 oz (75.4 kg)   SpO2 97%   BMI 27.64 kg/m    CC: R leg pain, check mole Subjective:   HPI: Melinda Hall is a 61 y.o. female presenting on 06/21/2021 for Leg Pain (C/o R leg pain/cramps, from anterior knee to ankle. Started yrs ago but worsened in last 2 wks. ) and Nevus (Wants mole on anterior upper R leg checked.  Noticed has increased in size. )   Notes new pain to R leg pain below knee to ankle as well as ankle weakness. Points to R lateral ankle as well as R lateral leg below knee. Describes sharp stabbing pain. No numbness or burning pain  Chronic L foot numbness.  Notes small nodule to R anterior distal thigh may be growing.  Denies inciting trauma/injury or fall.  No chest pain, dyspnea.  No benefit with voltaren topical gel.   Diagnosed 2022 with stage 3 follicular lymphoma s/p chemo course (IV Rituxan x4 wks 05/2020) - [facility]. Symptoms progressed 01/2021, saw oncology who restarted Rituxan treatment x4 wks, plan to start maintenance Rituxan q2 mo with f/u onc visit scheduled later this week. Night sweats have improved significantly, but notes ongoing fatigue, progressively worse over the past 2 weeks.  No pain.   Notes worsening situational mood due to this latest lymphoma remission. Currently on short term disability due to all of the above.  Latest potassium was high - planned rpt this week through onc.  No h/o seizures.   S/p prolia injection 03/2021.      Relevant past medical, surgical, family and social history reviewed and updated as indicated. Interim medical history since our last visit reviewed. Allergies and medications reviewed and updated. Outpatient Medications Prior to Visit  Medication Sig Dispense Refill   calcium carbonate (OS-CAL) 600 MG  TABS tablet Take 600 mg by mouth daily with breakfast.     Cholecalciferol (VITAMIN D3) 1000 units CAPS Take 3 capsules (3,000 Units total) by mouth daily.     ferrous sulfate 325 (65 FE) MG tablet Take 1 tablet (325 mg total) by mouth every Monday, Wednesday, and Friday.     Magnesium 250 MG TABS Take 1 tablet (250 mg total) by mouth daily.  0   PROLIA 60 MG/ML SOSY injection INJECT '60MG'$  UNDER THE SKIN EVERY 6 MONTHS 1 mL 0   vitamin B-12 (CYANOCOBALAMIN) 1000 MCG tablet Take 1 tablet (1,000 mcg total) by mouth daily.     zinc gluconate 50 MG tablet Take 1 tablet (50 mg total) by mouth daily.     No facility-administered medications prior to visit.     Per HPI unless specifically indicated in ROS section below Review of Systems  Objective:  BP 122/84   Pulse (!) 58   Temp 97.7 F (36.5 C) (Temporal)   Ht '5\' 5"'$  (1.651 m)   Wt 166 lb 2 oz (75.4 kg)   SpO2 97%   BMI 27.64 kg/m   Wt Readings from Last 3 Encounters:  06/21/21 166 lb 2 oz (75.4 kg)  05/17/21 164 lb (74.4 kg)  04/21/21 164 lb (74.4 kg)      Physical Exam Vitals and nursing note reviewed.  Constitutional:  Appearance: Normal appearance. She is not ill-appearing.  Eyes:     Extraocular Movements: Extraocular movements intact.     Pupils: Pupils are equal, round, and reactive to light.  Cardiovascular:     Rate and Rhythm: Normal rate and regular rhythm.     Pulses: Normal pulses.     Heart sounds: Murmur (2/6 systolic USB) heard.  Pulmonary:     Effort: Pulmonary effort is normal. No respiratory distress.     Breath sounds: Normal breath sounds. No wheezing, rhonchi or rales.  Musculoskeletal:        General: Tenderness present.     Right lower leg: No edema.     Left lower leg: No edema.     Comments:  Diminished pedal pulses bilaterally FROM bilateral knees and ankles without ligament laxity No pain at bilateral malleoli Point tender to palpation to leg below right knee just medial to head of fibula  and lateral to anterior tibia.  No palpable cords, no popliteal fullness.   Skin:    General: Skin is warm and dry.     Findings: No rash.  Neurological:     Mental Status: She is alert.  Psychiatric:        Mood and Affect: Mood normal.        Behavior: Behavior normal.      Results for orders placed or performed during the hospital encounter of 06/02/21  I-STAT creatinine  Result Value Ref Range   Creatinine, Ser 0.70 0.44 - 1.00 mg/dL   Lab Results  Component Value Date   IRON 93 02/24/2021   TIBC 338.8 02/24/2021   FERRITIN 42.8 02/24/2021    Lab Results  Component Value Date   WBC 6.2 04/21/2021   HGB 15.4 (H) 04/21/2021   HCT 47.5 (H) 04/21/2021   MCV 94.2 04/21/2021   PLT 200 04/21/2021    Lab Results  Component Value Date   CREATININE 0.70 06/02/2021   BUN 19 04/21/2021   NA 135 04/21/2021   K 5.7 (H) 04/21/2021   CL 101 04/21/2021   CO2 30 04/21/2021       06/21/2021   12:42 PM 04/05/2021    3:52 PM 02/24/2021    3:33 PM 02/25/2020    3:15 PM 12/05/2018    4:11 PM  Depression screen PHQ 2/9  Decreased Interest 2 1 0 0 1  Down, Depressed, Hopeless 1 1 0 0 0  PHQ - 2 Score 3 2 0 0 1  Altered sleeping '1 1   1  '$ Tired, decreased energy '2 2   1  '$ Change in appetite 3 0   1  Feeling bad or failure about yourself  1 0   0  Trouble concentrating '1 1   1  '$ Moving slowly or fidgety/restless 1 0   0  Suicidal thoughts 0 0   0  PHQ-9 Score '12 6   5  '$ Difficult doing work/chores Very difficult Somewhat difficult          06/21/2021   12:43 PM 12/05/2018    4:11 PM  GAD 7 : Generalized Anxiety Score  Nervous, Anxious, on Edge 1 0  Control/stop worrying 2 0  Worry too much - different things 2 1  Trouble relaxing 1 0  Restless 1 0  Easily annoyed or irritable 0 0  Afraid - awful might happen 0 1  Total GAD 7 Score 7 2   Assessment & Plan:   Problem List Items Addressed This Visit  Right leg pain - Primary    Point tender medial to head of fibula, as  well as tender along posterior lateral malleolus.  Overall benign exam although some decreased pedal pulses noted on exam.  Anticipate pain may be related to venous insufficiency - discussed trial of 20-30 mmHg knee high compression stockings, update if trouble tolerating or worsening symptoms.  Will also check R leg films and consider ABIs pending results of above.  In interim, Rx tramadol for breakthrough pain.  voltaren was ineffective.        Relevant Orders   DG Tibia/Fibula Right   Follicular lymphoma ([place])    Reviewed recent lymphoma treatment course, pending first maintenance treatment later this week. Appreciate oncology care of patient.        Situational depression    Follicular lymphoma treatment course this year has significantly affected mood. She is interested in trial of antidepressant . Given marked fatigue, will start activating antidepressant wellbutrin SR '100mg'$  once daily, reviewing customary antidepressant precautions. RTC 6 wks f/u visit.        Relevant Medications   buPROPion ER (WELLBUTRIN SR) 100 MG 12 hr tablet     Meds ordered this encounter  Medications   buPROPion ER (WELLBUTRIN SR) 100 MG 12 hr tablet    Sig: Take 1 tablet (100 mg total) by mouth in the morning.    Dispense:  30 tablet    Refill:  6   Orders Placed This Encounter  Procedures   DG Tibia/Fibula Right    Standing Status:   Future    Number of Occurrences:   1    Standing Expiration Date:   06/22/2022    Order Specific Question:   Reason for Exam (SYMPTOM  OR DIAGNOSIS REQUIRED)    Answer:   right leg pain    Order Specific Question:   Preferred imaging location?    Answer:   [facility]     Patient Instructions  Comienze wellbutrin SL '100mg'$  diarios en la maana.  Placa de pierna derecha hoy.  Trate medias de compresion para dolor de pierna derecha. Si empeora, pare y dejeme saber para evaluar circulacion de las piernas. Vaya a tienda de equipos medicos y busque  compresion 20-30 mmHg.  Regresar en 1.5 meses para seguimiento.  La remitiremos a Risk analyst.   Follow up plan: Return in about 6 weeks (around 08/02/2021), or if symptoms worsen or fail to improve, for follow up visit.  [name], MD

## 2021-06-21 NOTE — Assessment & Plan Note (Signed)
Follicular lymphoma treatment course this year has significantly affected mood. She is interested in trial of antidepressant . Given marked fatigue, will start activating antidepressant wellbutrin SR '100mg'$  once daily, reviewing customary antidepressant precautions. RTC 6 wks f/u visit.

## 2021-06-23 ENCOUNTER — Inpatient Hospital Stay (HOSPITAL_BASED_OUTPATIENT_CLINIC_OR_DEPARTMENT_OTHER): Payer: BC Managed Care – PPO | Admitting: Oncology

## 2021-06-23 ENCOUNTER — Inpatient Hospital Stay: Payer: BC Managed Care – PPO

## 2021-06-23 VITALS — BP 118/82 | HR 64 | Temp 97.7°F | Resp 18 | Wt 163.9 lb

## 2021-06-23 VITALS — BP 128/98 | HR 49

## 2021-06-23 DIAGNOSIS — C8218 Follicular lymphoma grade II, lymph nodes of multiple sites: Secondary | ICD-10-CM

## 2021-06-23 DIAGNOSIS — E041 Nontoxic single thyroid nodule: Secondary | ICD-10-CM | POA: Diagnosis not present

## 2021-06-23 DIAGNOSIS — C8223 Follicular lymphoma grade III, unspecified, intra-abdominal lymph nodes: Secondary | ICD-10-CM | POA: Diagnosis not present

## 2021-06-23 DIAGNOSIS — C8200 Follicular lymphoma grade I, unspecified site: Secondary | ICD-10-CM | POA: Diagnosis not present

## 2021-06-23 DIAGNOSIS — Z5112 Encounter for antineoplastic immunotherapy: Secondary | ICD-10-CM | POA: Diagnosis not present

## 2021-06-23 DIAGNOSIS — D751 Secondary polycythemia: Secondary | ICD-10-CM | POA: Diagnosis not present

## 2021-06-23 LAB — CBC WITH DIFFERENTIAL/PLATELET
Abs Immature Granulocytes: 0.02 10*3/uL (ref 0.00–0.07)
Basophils Absolute: 0 10*3/uL (ref 0.0–0.1)
Basophils Relative: 1 %
Eosinophils Absolute: 0.1 10*3/uL (ref 0.0–0.5)
Eosinophils Relative: 1 %
HCT: 45.2 % (ref 36.0–46.0)
Hemoglobin: 15.1 g/dL — ABNORMAL HIGH (ref 12.0–15.0)
Immature Granulocytes: 0 %
Lymphocytes Relative: 31 %
Lymphs Abs: 1.8 10*3/uL (ref 0.7–4.0)
MCH: 31.3 pg (ref 26.0–34.0)
MCHC: 33.4 g/dL (ref 30.0–36.0)
MCV: 93.8 fL (ref 80.0–100.0)
Monocytes Absolute: 0.6 10*3/uL (ref 0.1–1.0)
Monocytes Relative: 10 %
Neutro Abs: 3.4 10*3/uL (ref 1.7–7.7)
Neutrophils Relative %: 57 %
Platelets: 197 10*3/uL (ref 150–400)
RBC: 4.82 MIL/uL (ref 3.87–5.11)
RDW: 13 % (ref 11.5–15.5)
WBC: 5.9 10*3/uL (ref 4.0–10.5)
nRBC: 0 % (ref 0.0–0.2)

## 2021-06-23 LAB — COMPREHENSIVE METABOLIC PANEL
ALT: 43 U/L (ref 0–44)
AST: 32 U/L (ref 15–41)
Albumin: 4.2 g/dL (ref 3.5–5.0)
Alkaline Phosphatase: 42 U/L (ref 38–126)
Anion gap: 4 — ABNORMAL LOW (ref 5–15)
BUN: 22 mg/dL (ref 8–23)
CO2: 28 mmol/L (ref 22–32)
Calcium: 9.2 mg/dL (ref 8.9–10.3)
Chloride: 105 mmol/L (ref 98–111)
Creatinine, Ser: 0.65 mg/dL (ref 0.44–1.00)
GFR, Estimated: >60 mL/min (ref >60)
Glucose, Bld: 98 mg/dL (ref 70–99)
Potassium: 4.8 mmol/L (ref 3.5–5.1)
Sodium: 137 mmol/L (ref 135–145)
Total Bilirubin: 0.9 mg/dL (ref 0.3–1.2)
Total Protein: 6.9 g/dL (ref 6.5–8.1)

## 2021-06-23 MED ORDER — SODIUM CHLORIDE 0.9 % IV SOLN
375.0000 mg/m2 | Freq: Once | INTRAVENOUS | Status: AC
  Administered 2021-06-23: 700 mg via INTRAVENOUS
  Filled 2021-06-23: qty 50

## 2021-06-23 MED ORDER — DIPHENHYDRAMINE HCL 25 MG PO CAPS
25.0000 mg | ORAL_CAPSULE | Freq: Once | ORAL | Status: AC
  Administered 2021-06-23: 25 mg via ORAL
  Filled 2021-06-23: qty 1

## 2021-06-23 MED ORDER — SODIUM CHLORIDE 0.9 % IV SOLN
Freq: Once | INTRAVENOUS | Status: AC
  Filled 2021-06-23: qty 250

## 2021-06-23 MED ORDER — ACETAMINOPHEN 325 MG PO TABS
650.0000 mg | ORAL_TABLET | Freq: Once | ORAL | Status: AC
  Administered 2021-06-23: 650 mg via ORAL
  Filled 2021-06-23: qty 2

## 2021-06-24 ENCOUNTER — Inpatient Hospital Stay: Payer: BC Managed Care – PPO | Admitting: Licensed Clinical Social Worker

## 2021-06-24 DIAGNOSIS — C8200 Follicular lymphoma grade I, unspecified site: Secondary | ICD-10-CM

## 2021-06-25 ENCOUNTER — Encounter: Payer: Self-pay | Admitting: Oncology

## 2021-06-25 NOTE — Progress Notes (Signed)
[facility] CSW Progress Note  Holiday representative met with patient to discuss adjustment concerns during treatment. Patient stated she is feeling much better and has been using mindfulness and acceptance skills to manage  stress and anxiety.  Patient stated she spoke with her mother and daughter at length about the results of the scan and treatment plan moving forward.  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  CSW and patient discussed being flexible about needing to change plans and how to continue using skills to help with acceptance and mindfulness.  Patient stated she will probably retire in October, and will possibly return to work on a part-time basis.  Patient stated the long term disability process is cumbersome and her employer is willing to work with her and adjust her schedule as needed, therefore she may decide to stop trying to get disability, but has not made up her mind yet. Patient stated she will contact CSW to reschedule appointment when she has made final decision about work/disability.     Adelene Amas, LCSW

## 2021-07-13 ENCOUNTER — Other Ambulatory Visit: Payer: Self-pay | Admitting: Family Medicine

## 2021-07-15 ENCOUNTER — Encounter: Payer: Self-pay | Admitting: *Deleted

## 2021-07-15 NOTE — Telephone Encounter (Signed)
Ok to send 90-day rx, per pharmacy request? 

## 2021-07-16 ENCOUNTER — Encounter: Payer: Self-pay | Admitting: Family Medicine

## 2021-07-16 DIAGNOSIS — L989 Disorder of the skin and subcutaneous tissue, unspecified: Secondary | ICD-10-CM

## 2021-07-16 NOTE — Telephone Encounter (Signed)
ERx 

## 2021-07-19 DIAGNOSIS — L989 Disorder of the skin and subcutaneous tissue, unspecified: Secondary | ICD-10-CM | POA: Insufficient documentation

## 2021-08-18 ENCOUNTER — Ambulatory Visit: Payer: BC Managed Care – PPO | Admitting: Oncology

## 2021-08-18 ENCOUNTER — Inpatient Hospital Stay (HOSPITAL_BASED_OUTPATIENT_CLINIC_OR_DEPARTMENT_OTHER): Payer: BC Managed Care – PPO | Admitting: Medical Oncology

## 2021-08-18 ENCOUNTER — Inpatient Hospital Stay: Payer: BC Managed Care – PPO | Attending: Oncology

## 2021-08-18 VITALS — BP 139/99 | HR 60 | Temp 96.5°F | Resp 16 | Wt 170.0 lb

## 2021-08-18 DIAGNOSIS — C8218 Follicular lymphoma grade II, lymph nodes of multiple sites: Secondary | ICD-10-CM

## 2021-08-18 DIAGNOSIS — E042 Nontoxic multinodular goiter: Secondary | ICD-10-CM | POA: Diagnosis not present

## 2021-08-18 DIAGNOSIS — Z5112 Encounter for antineoplastic immunotherapy: Secondary | ICD-10-CM | POA: Diagnosis not present

## 2021-08-18 DIAGNOSIS — C8200 Follicular lymphoma grade I, unspecified site: Secondary | ICD-10-CM | POA: Diagnosis not present

## 2021-08-18 DIAGNOSIS — E875 Hyperkalemia: Secondary | ICD-10-CM

## 2021-08-18 DIAGNOSIS — D751 Secondary polycythemia: Secondary | ICD-10-CM | POA: Diagnosis not present

## 2021-08-18 DIAGNOSIS — Z79899 Other long term (current) drug therapy: Secondary | ICD-10-CM | POA: Diagnosis not present

## 2021-08-18 DIAGNOSIS — F4323 Adjustment disorder with mixed anxiety and depressed mood: Secondary | ICD-10-CM

## 2021-08-18 LAB — COMPREHENSIVE METABOLIC PANEL
ALT: 27 U/L (ref 0–44)
AST: 20 U/L (ref 15–41)
Albumin: 3.8 g/dL (ref 3.5–5.0)
Alkaline Phosphatase: 37 U/L — ABNORMAL LOW (ref 38–126)
Anion gap: 4 — ABNORMAL LOW (ref 5–15)
BUN: 15 mg/dL (ref 8–23)
CO2: 27 mmol/L (ref 22–32)
Calcium: 8.8 mg/dL — ABNORMAL LOW (ref 8.9–10.3)
Chloride: 107 mmol/L (ref 98–111)
Creatinine, Ser: 0.62 mg/dL (ref 0.44–1.00)
GFR, Estimated: >60 mL/min (ref >60)
Glucose, Bld: 91 mg/dL (ref 70–99)
Potassium: 5 mmol/L (ref 3.5–5.1)
Sodium: 138 mmol/L (ref 135–145)
Total Bilirubin: 0.9 mg/dL (ref 0.3–1.2)
Total Protein: 6.3 g/dL — ABNORMAL LOW (ref 6.5–8.1)

## 2021-08-18 LAB — CBC WITH DIFFERENTIAL/PLATELET
Abs Immature Granulocytes: 0.01 10*3/uL (ref 0.00–0.07)
Basophils Absolute: 0 10*3/uL (ref 0.0–0.1)
Basophils Relative: 1 %
Eosinophils Absolute: 0.1 10*3/uL (ref 0.0–0.5)
Eosinophils Relative: 1 %
HCT: 45.7 % (ref 36.0–46.0)
Hemoglobin: 14.9 g/dL (ref 12.0–15.0)
Immature Granulocytes: 0 %
Lymphocytes Relative: 29 %
Lymphs Abs: 1.9 10*3/uL (ref 0.7–4.0)
MCH: 30.9 pg (ref 26.0–34.0)
MCHC: 32.6 g/dL (ref 30.0–36.0)
MCV: 94.8 fL (ref 80.0–100.0)
Monocytes Absolute: 0.6 10*3/uL (ref 0.1–1.0)
Monocytes Relative: 9 %
Neutro Abs: 3.8 10*3/uL (ref 1.7–7.7)
Neutrophils Relative %: 60 %
Platelets: 203 10*3/uL (ref 150–400)
RBC: 4.82 MIL/uL (ref 3.87–5.11)
RDW: 13.1 % (ref 11.5–15.5)
WBC: 6.4 10*3/uL (ref 4.0–10.5)
nRBC: 0 % (ref 0.0–0.2)

## 2021-08-18 NOTE — Progress Notes (Signed)
Returns for follow-up prior to Rituximab. She reports intermittent fatigue and occasional night sweats. She states that some days are good and some are bad.

## 2021-08-18 NOTE — Progress Notes (Signed)
[facility]  Telephone:([phone]) [phone] Fax:(336) [phone]  ID: Melinda Hall OB: 03/09/1960  MR#: [id number]  HRC#:[id number]  Patient Care Team: [name], MD as PCP - General (Family Medicine) [name], MD as Consulting Physician (Oncology) [name], MD as Consulting Physician (Neurosurgery)  CHIEF COMPLAINT: Recurrent stage III follicular lymphoma, grade 1/2.    INTERVAL HISTORY: Patient returns to clinic today for further evaluation, discussion of her imaging results, and continuation of maintenance Rituxan.  She reports that she is doing well. Some days she feels fatigued but this is improving compared to a month ago.Mood improved. She otherwise feels well.  She denies any fevers, night sweats, or weight loss.  She does not complaining of any abdominal discomfort today.  She has no neurologic complaints. She has no chest pain, shortness of breath, cough, or hemoptysis.  She denies any nausea, vomiting, constipation, or diarrhea.  She has no urinary complaints.  Patient offers no further specific complaints today.  REVIEW OF SYSTEMS:   Review of Systems  Constitutional:  Positive for malaise/fatigue. Negative for diaphoresis, fever and weight loss.  Respiratory: Negative.  Negative for cough, hemoptysis and shortness of breath.   Cardiovascular: Negative.  Negative for chest pain and leg swelling.  Gastrointestinal: Negative.  Negative for abdominal pain.  Genitourinary: Negative.  Negative for dysuria.  Musculoskeletal: Negative.  Negative for back pain and myalgias.  Skin: Negative.  Negative for rash.  Neurological:  Negative for tingling, sensory change, focal weakness, weakness and headaches.  Psychiatric/Behavioral:  Negative for depression. The patient is not nervous/anxious.     As per HPI. Otherwise, a complete review of systems is negative.  PAST MEDICAL HISTORY: Past Medical History:  Diagnosis Date   Cancer ([place])    Dermoid  tumor    hx of--left ovary   Follicular lymphoma ([facility])    Gastric bypass status for obesity 2004   s/p roux-en-y   Generalized headaches    frequent   Heart murmur    History of cardiac murmur    Intraductal papilloma of breast, left 06/18/2017   05/2017 - s/p excision 07/2017   Osteoporosis 03/2016   DEXA -2.6 spine   Salivary gland stone    x 2   Smoker     PAST SURGICAL HISTORY: Past Surgical History:  Procedure Laterality Date   ABDOMINAL HYSTERECTOMY     only ovary was removed   ANTERIOR CERVICAL DECOMP/DISCECTOMY FUSION  12/2016   [facility]  ~1992   for dermoid ovarian cysts   BREAST BIOPSY Left 06/13/2017   papilloma with excision 07/2017   BREAST LUMPECTOMY WITH NEEDLE LOCALIZATION Left 08/10/2017   intraductal papilloma - Caroleen Hamman F, MD   CHOLECYSTECTOMY  2000s   COLONOSCOPY WITH PROPOFOL N/A 11/27/2015   TA rpt 5 yrs Lucilla Lame, MD)   COLONOSCOPY WITH PROPOFOL N/A 05/17/2021   TAs, SSPs, rpt 3 yrs Allen Norris, Darren, MD)   GASTRIC BYPASS  2004   roux en y [facility])   POLYPECTOMY  11/27/2015   Procedure: POLYPECTOMY;  Surgeon: Lucilla Lame, MD;  Location: [facility];  Service: Endoscopy;;   POLYPECTOMY  05/17/2021   Procedure: POLYPECTOMY;  Surgeon: Lucilla Lame, MD;  Location: [facility];  Service: Endoscopy;;   REDUCTION MAMMAPLASTY Bilateral 1990's   SALIVARY GLAND SURGERY     SALIVARY STONE REMOVAL      FAMILY HISTORY: Family History  Problem Relation Age of Onset   Cancer  Father 63       leukemia   Hypertension Mother    Diabetes Neg Hx    Stroke Neg Hx    Coronary artery disease Neg Hx    Breast cancer Neg Hx     ADVANCED DIRECTIVES (Y/N):  N  HEALTH MAINTENANCE: Social History   Tobacco Use   Smoking status: Former    Packs/day: 0.25    Years: 20.00    Total pack years: 5.00    Types: Cigarettes    Quit date: 10/31/2012    Years since quitting: 8.8   Smokeless tobacco: Never   Vaping Use   Vaping Use: Never used  Substance Use Topics   Alcohol use: Not Currently   Drug use: No     Colonoscopy:  PAP:  Bone density:  Lipid panel:  Allergies  Allergen Reactions   Boniva [Ibandronic Acid] Other (See Comments)    Diffuse body aches    Current Outpatient Medications  Medication Sig Dispense Refill   buPROPion ER (WELLBUTRIN SR) 100 MG 12 hr tablet TAKE 1 TABLET (100 MG TOTAL) BY MOUTH IN THE MORNING 90 tablet 2   calcium carbonate (OS-CAL) 600 MG TABS tablet Take 600 mg by mouth daily with breakfast.     Cholecalciferol (VITAMIN D3) 1000 units CAPS Take 3 capsules (3,000 Units total) by mouth daily.     ferrous sulfate 325 (65 FE) MG tablet Take 1 tablet (325 mg total) by mouth every Monday, Wednesday, and Friday.     Magnesium 250 MG TABS Take 1 tablet (250 mg total) by mouth daily.  0   PROLIA 60 MG/ML SOSY injection INJECT '60MG'$  UNDER THE SKIN EVERY 6 MONTHS 1 mL 0   vitamin B-12 (CYANOCOBALAMIN) 1000 MCG tablet Take 1 tablet (1,000 mcg total) by mouth daily.     zinc gluconate 50 MG tablet Take 1 tablet (50 mg total) by mouth daily.     No current facility-administered medications for this visit.    OBJECTIVE: Vitals:   08/18/21 0848  BP: (!) 139/99  Pulse: 60  Resp: 16  Temp: (!) 96.5 F (35.8 C)  SpO2: 100%     Body mass index is 28.29 kg/m.    ECOG FS:0 - Asymptomatic  General: Well-developed, well-nourished, no acute distress. Eyes: Pink conjunctiva, anicteric sclera. HEENT: Normocephalic, moist mucous membranes. Lungs: No audible wheezing or coughing. Heart: Regular rate and rhythm. Abdomen: Soft, nontender, no obvious distention. Musculoskeletal: No edema, cyanosis, or clubbing. Neuro: Alert, answering all questions appropriately. Cranial nerves grossly intact. Skin: No rashes or petechiae noted. Psych: Normal affect.   LAB RESULTS:  Lab Results  Component Value Date   NA 138 08/18/2021   K 5.0 08/18/2021   CL 107  08/18/2021   CO2 27 08/18/2021   GLUCOSE 91 08/18/2021   BUN 15 08/18/2021   CREATININE 0.62 08/18/2021   CALCIUM 8.8 (L) 08/18/2021   PROT 6.3 (L) 08/18/2021   ALBUMIN 3.8 08/18/2021   AST 20 08/18/2021   ALT 27 08/18/2021   ALKPHOS 37 (L) 08/18/2021   BILITOT 0.9 08/18/2021   GFRNONAA >60 08/18/2021   GFRAA >60 07/10/2013    Lab Results  Component Value Date   WBC 6.4 08/18/2021   NEUTROABS 3.8 08/18/2021   HGB 14.9 08/18/2021   HCT 45.7 08/18/2021   MCV 94.8 08/18/2021   PLT 203 08/18/2021     STUDIES: No results found.  ONCOLOGY HISTORY: Retroperitoneal lymph node biopsy on April 21, 2020 confirmed the diagnosis.  PET scan on April 14, 2020 revealed a hypermetabolic right sided retroperitoneal nodal mass along with hypermetabolic mesenteric adenopathy.  Patient also noted to have bilateral axillary adenopathy.  Although patient was asymptomatic and active surveillance was offered, she elected to pursue treatment and received IV Rituxan weekly x4 completing on May 28, 2020.  Repeat PET scan from August 24, 2020 reviewed independently with significant treatment response, although likely residual disease.  Patient previously declined maintenance Rituxan.   ASSESSMENT: Recurrent stage III follicular lymphoma, grade 1/2.    PLAN:    1.  Recurrent stage III follicular lymphoma, grade 1/2:  Repeat imaging on March 15, 2021 reviewed independently with progression of disease.  Patient is also symptomatic.  She has agreed to reinitiate weekly Rituxan x4 followed by maintenance Rituxan every 8 weeks for up to 2 years.  Can also consider Zevalin in the near future.  Repeat imaging on Jun 02, 2021 reviewed independently and reported as above with continued improvement of disease burden.  Patient has now completed 4 weekly cycles of Rituxan.  Proceed with cycle 2 of maintenance Rituxan.  Return to clinic in 8 weeks for further evaluation and consideration of cycle 3.  Will reimage in  approximately November 2023.   2.  Hypermetabolic thyroid nodule: Chronic. Previously, biopsy was negative for malignancy. 4.  Polycythemia: Chronic and improved.  Patient's hemoglobin is 14.9 today. 6.  Hyperkalemia: Resolved. 7.  Depression: Chronic- Improved. Continue primary care follow up.    Patient expressed understanding and was in agreement with this plan. She also understands that She can call clinic at any time with any questions, concerns, or complaints.    Cancer Staging  Follicular lymphoma ([place]) Staging form: Hodgkin and Non-Hodgkin Lymphoma, AJCC 8th Edition - Clinical stage from 03/12/4707: Stage III (Follicular lymphoma) - Signed by [name], MD on 04/29/2020 Stage prefix: Initial diagnosis   Hughie Closs, PA-C   08/18/2021 9:34 AM

## 2021-08-19 ENCOUNTER — Inpatient Hospital Stay: Payer: BC Managed Care – PPO

## 2021-08-19 VITALS — BP 128/90 | HR 62 | Temp 96.1°F | Resp 16

## 2021-08-19 DIAGNOSIS — C8200 Follicular lymphoma grade I, unspecified site: Secondary | ICD-10-CM | POA: Diagnosis not present

## 2021-08-19 DIAGNOSIS — Z5112 Encounter for antineoplastic immunotherapy: Secondary | ICD-10-CM | POA: Diagnosis not present

## 2021-08-19 DIAGNOSIS — Z79899 Other long term (current) drug therapy: Secondary | ICD-10-CM | POA: Diagnosis not present

## 2021-08-19 DIAGNOSIS — C8218 Follicular lymphoma grade II, lymph nodes of multiple sites: Secondary | ICD-10-CM

## 2021-08-19 MED ORDER — SODIUM CHLORIDE 0.9 % IV SOLN
375.0000 mg/m2 | Freq: Once | INTRAVENOUS | Status: AC
  Administered 2021-08-19: 700 mg via INTRAVENOUS
  Filled 2021-08-19: qty 50

## 2021-08-19 MED ORDER — SODIUM CHLORIDE 0.9 % IV SOLN: 375.0000 mg/m2 | Freq: Once | INTRAVENOUS | Status: DC

## 2021-08-19 MED ORDER — SODIUM CHLORIDE 0.9 % IV SOLN
Freq: Once | INTRAVENOUS | Status: AC
  Filled 2021-08-19: qty 250

## 2021-08-19 MED ORDER — DIPHENHYDRAMINE HCL 25 MG PO CAPS
25.0000 mg | ORAL_CAPSULE | Freq: Once | ORAL | Status: AC
  Administered 2021-08-19: 25 mg via ORAL
  Filled 2021-08-19: qty 1

## 2021-08-19 MED ORDER — ACETAMINOPHEN 325 MG PO TABS
650.0000 mg | ORAL_TABLET | Freq: Once | ORAL | Status: AC
  Administered 2021-08-19: 650 mg via ORAL
  Filled 2021-08-19: qty 2

## 2021-08-19 NOTE — Patient Instructions (Signed)
[facility]  Discharge Instructions: Thank you for choosing [facility] to provide your oncology and hematology care.  If you have a lab appointment with the [facility], please go directly to the [facility] and check in at the registration area.  Wear comfortable clothing and clothing appropriate for easy access to any Portacath or PICC line.   We strive to give you quality time with your provider. You may need to reschedule your appointment if you arrive late (15 or more minutes).  Arriving late affects you and other patients whose appointments are after yours.  Also, if you miss three or more appointments without notifying the office, you may be dismissed from the clinic at the provider's discretion.      For prescription refill requests, have your pharmacy contact our office and allow 72 hours for refills to be completed.    Today you received the following chemotherapy and/or immunotherapy agents Rituxin      To help prevent nausea and vomiting after your treatment, we encourage you to take your nausea medication as directed.  BELOW ARE SYMPTOMS THAT SHOULD BE REPORTED IMMEDIATELY: *FEVER GREATER THAN 100.4 F (38 C) OR HIGHER *CHILLS OR SWEATING *NAUSEA AND VOMITING THAT IS NOT CONTROLLED WITH YOUR NAUSEA MEDICATION *UNUSUAL SHORTNESS OF BREATH *UNUSUAL BRUISING OR BLEEDING *URINARY PROBLEMS (pain or burning when urinating, or frequent urination) *BOWEL PROBLEMS (unusual diarrhea, constipation, pain near the anus) TENDERNESS IN MOUTH AND THROAT WITH OR WITHOUT PRESENCE OF ULCERS (sore throat, sores in mouth, or a toothache) UNUSUAL RASH, SWELLING OR PAIN  UNUSUAL VAGINAL DISCHARGE OR ITCHING   Items with * indicate a potential emergency and should be followed up as soon as possible or go to the Emergency Department if any problems should occur.  Please show the CHEMOTHERAPY ALERT CARD or IMMUNOTHERAPY ALERT CARD at check-in to the  Emergency Department and triage nurse.  Should you have questions after your visit or need to cancel or reschedule your appointment, please contact [facility] [place] AT [place]  [phone] and follow the prompts.  Office hours are 8:00 a.m. to 4:30 p.m. Monday - Friday. Please note that voicemails left after 4:00 p.m. may not be returned until the following business day.  We are closed weekends and major holidays. You have access to a nurse at all times for urgent questions. Please call the main number to the clinic [phone] and follow the prompts.  For any non-urgent questions, you may also contact your provider using MyChart. We now offer e-Visits for anyone 68 and older to request care online for non-urgent symptoms. For details visit [email or web address].   Also download the MyChart app! Go to the app store, search "MyChart", open the app, select [facility], and log in with your MyChart username and password.  Masks are optional in the cancer centers. If you would like for your care team to wear a mask while they are taking care of you, please let them know. For doctor visits, patients may have with them one support person who is at least 62 years old. At this time, visitors are not allowed in the infusion area.

## 2021-08-23 ENCOUNTER — Other Ambulatory Visit: Payer: Self-pay

## 2021-09-10 ENCOUNTER — Other Ambulatory Visit: Payer: Self-pay | Admitting: Oncology

## 2021-09-10 DIAGNOSIS — C8218 Follicular lymphoma grade II, lymph nodes of multiple sites: Secondary | ICD-10-CM

## 2021-09-10 NOTE — Progress Notes (Signed)
ON PATHWAY REGIMEN - Lymphoma and CLL  No Change  Continue With Treatment as Ordered.  Original Decision Date/Time: 04/29/2020 09:47     Administer weekly:     Rituximab-xxxx   **Always confirm dose/schedule in your pharmacy ordering system**  Patient Characteristics: Follicular Lymphoma, Grades 1, 2, and 3A, First Line, Stage III / IV, Asymptomatic or Low Bulk Disease Disease Type: Follicular Lymphoma, Grade 1, 2, or 3A Disease Type: Not Applicable Disease Type: Not Applicable Line of Therapy: First Line Disease Characteristics: Asymptomatic or Low Bulk Disease Intent of Therapy: Non-Curative / Palliative Intent, Discussed with Patient

## 2021-09-13 ENCOUNTER — Other Ambulatory Visit: Payer: Self-pay | Admitting: Family Medicine

## 2021-09-13 DIAGNOSIS — M810 Age-related osteoporosis without current pathological fracture: Secondary | ICD-10-CM

## 2021-09-15 ENCOUNTER — Other Ambulatory Visit: Payer: Self-pay

## 2021-09-20 ENCOUNTER — Other Ambulatory Visit: Payer: Self-pay

## 2021-09-24 ENCOUNTER — Other Ambulatory Visit: Payer: Self-pay

## 2021-09-29 ENCOUNTER — Telehealth: Payer: Self-pay

## 2021-09-29 DIAGNOSIS — M810 Age-related osteoporosis without current pathological fracture: Secondary | ICD-10-CM

## 2021-09-29 NOTE — Telephone Encounter (Addendum)
Patient gets Prolia through mail order pharmacy. This will be delivered. Need to set up lab work. Next injection due 10/17/21

## 2021-09-30 NOTE — Telephone Encounter (Signed)
Spoke with patient, Lab 9/1 and NV 10/19/21

## 2021-10-01 ENCOUNTER — Other Ambulatory Visit (INDEPENDENT_AMBULATORY_CARE_PROVIDER_SITE_OTHER): Payer: BC Managed Care – PPO

## 2021-10-01 DIAGNOSIS — M810 Age-related osteoporosis without current pathological fracture: Secondary | ICD-10-CM

## 2021-10-01 LAB — BASIC METABOLIC PANEL
BUN: 14 mg/dL (ref 6–23)
CO2: 29 mEq/L (ref 19–32)
Calcium: 9.1 mg/dL (ref 8.4–10.5)
Chloride: 106 mEq/L (ref 96–112)
Creatinine, Ser: 0.6 mg/dL (ref 0.40–1.20)
GFR: 96.57 mL/min (ref >60.00)
Glucose, Bld: 83 mg/dL (ref 70–99)
Potassium: 4.6 mEq/L (ref 3.5–5.1)
Sodium: 140 mEq/L (ref 135–145)

## 2021-10-06 NOTE — Telephone Encounter (Signed)
CrCl is 119.84 mL/min Calcium normal 9.1

## 2021-10-07 ENCOUNTER — Other Ambulatory Visit: Payer: Self-pay | Admitting: Oncology

## 2021-10-13 ENCOUNTER — Other Ambulatory Visit: Payer: BC Managed Care – PPO

## 2021-10-13 ENCOUNTER — Ambulatory Visit: Payer: BC Managed Care – PPO | Admitting: Oncology

## 2021-10-13 NOTE — Progress Notes (Signed)
[place]  [phone] [phone]  ID: Melinda Hall OB: Apr 03, 1960  MR#: [id number]  ZJI#:[id number]  Patient Care Team: [name], MD as PCP - General (Family Medicine) [name], MD as Consulting Physician (Oncology) [name], MD as Consulting Physician (Neurosurgery)  CHIEF COMPLAINT: Recurrent stage III follicular lymphoma, grade 1/2.    INTERVAL HISTORY: Patient returns to clinic today for further evaluation and consideration of cycle 2 of maintenance Rituxan.  She currently feels well and is asymptomatic.  She does not complain of any weakness or fatigue today.  She has No night sweats.  She otherwise feels well.  She denies any fevers or weight loss.  She does not complain of any abdominal discomfort today.  She has no neurologic complaints. She has no chest pain, shortness of breath, cough, or hemoptysis.  She denies any nausea, vomiting, constipation, or diarrhea.  She has no urinary complaints.  Patient offers no further specific complaints today.  REVIEW OF SYSTEMS:   Review of Systems  Constitutional:  Positive for diaphoresis. Negative for fever, malaise/fatigue and weight loss.  Respiratory: Negative.  Negative for cough, hemoptysis and shortness of breath.   Cardiovascular: Negative.  Negative for chest pain and leg swelling.  Gastrointestinal: Negative.  Negative for abdominal pain.  Genitourinary: Negative.  Negative for dysuria.  Musculoskeletal: Negative.  Negative for back pain and myalgias.  Skin: Negative.  Negative for rash.  Neurological: Negative.  Negative for tingling, sensory change, focal weakness, weakness and headaches.  Psychiatric/Behavioral: Negative.  Negative for depression. The patient is not nervous/anxious.     As per HPI. Otherwise, a complete review of systems is negative.  PAST MEDICAL HISTORY: Past Medical History:  Diagnosis Date   Cancer ([place])    Dermoid tumor    hx  of--left ovary   Follicular lymphoma ([place])    Gastric bypass status for obesity 2004   s/p roux-en-y   Generalized headaches    frequent   Heart murmur    History of cardiac murmur    Intraductal papilloma of breast, left 06/18/2017   05/2017 - s/p excision 07/2017   Osteoporosis 03/2016   DEXA -2.6 spine   Salivary gland stone    x 2   Smoker     PAST SURGICAL HISTORY: Past Surgical History:  Procedure Laterality Date   ABDOMINAL HYSTERECTOMY     only ovary was removed   ANTERIOR CERVICAL DECOMP/DISCECTOMY FUSION  12/2016   [place]  ~1992   for dermoid ovarian cysts   BREAST BIOPSY Left 06/13/2017   papilloma with excision 07/2017   BREAST LUMPECTOMY WITH NEEDLE LOCALIZATION Left 08/10/2017   intraductal papilloma - Caroleen Hamman F, MD   CHOLECYSTECTOMY  2000s   COLONOSCOPY WITH PROPOFOL N/A 11/27/2015   TA rpt 5 yrs Lucilla Lame, MD)   COLONOSCOPY WITH PROPOFOL N/A 05/17/2021   TAs, SSPs, rpt 3 yrs Allen Norris, Darren, MD)   GASTRIC BYPASS  2004   roux en y [facility])   POLYPECTOMY  11/27/2015   Procedure: POLYPECTOMY;  Surgeon: Lucilla Lame, MD;  Location: [place];  Service: Endoscopy;;   POLYPECTOMY  05/17/2021   Procedure: POLYPECTOMY;  Surgeon: Lucilla Lame, MD;  Location: [place];  Service: Endoscopy;;   REDUCTION MAMMAPLASTY Bilateral 1990's   SALIVARY GLAND SURGERY     SALIVARY STONE REMOVAL      FAMILY HISTORY: Family History  Problem Relation Age of Onset  Cancer Father 22       leukemia   Hypertension Mother    Diabetes Neg Hx    Stroke Neg Hx    Coronary artery disease Neg Hx    Breast cancer Neg Hx     ADVANCED DIRECTIVES (Y/N):  N  HEALTH MAINTENANCE: Social History   Tobacco Use   Smoking status: Former    Packs/day: 0.25    Years: 20.00    Total pack years: 5.00    Types: Cigarettes    Quit date: 10/31/2012    Years since quitting: 8.9   Smokeless tobacco: Never  Vaping Use    Vaping Use: Never used  Substance Use Topics   Alcohol use: Not Currently   Drug use: No     Colonoscopy:  PAP:  Bone density:  Lipid panel:  Allergies  Allergen Reactions   Boniva [Ibandronic Acid] Other (See Comments)    Diffuse body aches    Current Outpatient Medications  Medication Sig Dispense Refill   buPROPion ER (WELLBUTRIN SR) 100 MG 12 hr tablet TAKE 1 TABLET (100 MG TOTAL) BY MOUTH IN THE MORNING 90 tablet 2   calcium carbonate (OS-CAL) 600 MG TABS tablet Take 600 mg by mouth daily with breakfast.     Cholecalciferol (VITAMIN D3) 1000 units CAPS Take 3 capsules (3,000 Units total) by mouth daily.     denosumab (PROLIA) 60 MG/ML SOSY injection PRESCRIBER TO INJECT '60MG'$  SUBCUTANEOUSLY EVERY 6 MONTHS 1 mL 0   ferrous sulfate 325 (65 FE) MG tablet Take 1 tablet (325 mg total) by mouth every Monday, Wednesday, and Friday.     Magnesium 250 MG TABS Take 1 tablet (250 mg total) by mouth daily.  0   vitamin B-12 (CYANOCOBALAMIN) 1000 MCG tablet Take 1 tablet (1,000 mcg total) by mouth daily.     zinc gluconate 50 MG tablet Take 1 tablet (50 mg total) by mouth daily.     No current facility-administered medications for this visit.    OBJECTIVE: Vitals:   10/14/21 0837  BP: 124/88  Pulse: 62  Temp: 98.3 F (36.8 C)  SpO2: 100%     Body mass index is 27.62 kg/m.    ECOG FS:0 - Asymptomatic  General: Well-developed, well-nourished, no acute distress. Eyes: Pink conjunctiva, anicteric sclera. HEENT: Normocephalic, moist mucous membranes. Lungs: No audible wheezing or coughing. Heart: Regular rate and rhythm. Abdomen: Soft, nontender, no obvious distention. Musculoskeletal: No edema, cyanosis, or clubbing. Neuro: Alert, answering all questions appropriately. Cranial nerves grossly intact. Skin: No rashes or petechiae noted. Psych: Normal affect.  LAB RESULTS:  Lab Results  Component Value Date   NA 139 10/14/2021   K 4.7 10/14/2021   CL 109 10/14/2021    CO2 27 10/14/2021   GLUCOSE 100 (H) 10/14/2021   BUN 16 10/14/2021   CREATININE 0.64 10/14/2021   CALCIUM 8.5 (L) 10/14/2021   PROT 6.4 (L) 10/14/2021   ALBUMIN 3.8 10/14/2021   AST 20 10/14/2021   ALT 29 10/14/2021   ALKPHOS 44 10/14/2021   BILITOT 1.0 10/14/2021   GFRNONAA >60 10/14/2021   GFRAA >60 07/10/2013    Lab Results  Component Value Date   WBC 6.2 10/14/2021   NEUTROABS 3.7 10/14/2021   HGB 14.8 10/14/2021   HCT 45.5 10/14/2021   MCV 94.2 10/14/2021   PLT 202 10/14/2021     STUDIES: No results found.  ONCOLOGY HISTORY: Retroperitoneal lymph node biopsy on April 21, 2020 confirmed the diagnosis. PET scan on April 14, 2020 revealed a hypermetabolic right sided retroperitoneal nodal mass along with hypermetabolic mesenteric adenopathy.  Patient also noted to have bilateral axillary adenopathy.  Although patient was asymptomatic and active surveillance was offered, she elected to pursue treatment and received IV Rituxan weekly x4 completing on May 28, 2020.  Repeat PET scan from August 24, 2020 reviewed independently with significant treatment response, although likely residual disease.  Patient previously declined maintenance Rituxan.   ASSESSMENT: Recurrent stage III follicular lymphoma, grade 1/2.    PLAN:    1.  Recurrent stage III follicular lymphoma, grade 1/2:  Repeat imaging on March 15, 2021 reviewed independently with progression of disease.  Patient is also was symptomatic.  She has agreed to reinitiate weekly Rituxan x4 followed by maintenance Rituxan every 8 weeks for up to 2 years.  Can also consider Zevalin in the near future.  Repeat imaging on Jun 02, 2021 reviewed independently with continued improvement of disease burden.  Patient has now completed 4 weekly cycles of Rituxan.  Proceed with cycle 2 of maintenance Rituxan.  Return to clinic in 8 weeks for further evaluation and consideration of cycle 3.  Repeat imaging with CT scan 1 to 2 days prior to  next treatment.     2.  Hypermetabolic thyroid nodule: Previously, biopsy was negative for malignancy. 3.  Elevated liver enzymes: Resolved. 4.  Polycythemia:. 5.  Myalgias: Patient does not complain of this today.  Continue ibuprofen as needed. 6.  Hyperkalemia: Resolved. 7.  Depression: Significantly improved.  Continue current medications as prescribed.   Patient expressed understanding and was in agreement with this plan. She also understands that She can call clinic at any time with any questions, concerns, or complaints.    Cancer Staging  Follicular lymphoma ([place]) Staging form: Hodgkin and Non-Hodgkin Lymphoma, AJCC 8th Edition - Clinical stage from 7/59/1638: Stage III (Follicular lymphoma) - Signed by [name], MD on 04/29/2020 Stage prefix: Initial diagnosis   [name], MD   10/14/2021 10:57 AM

## 2021-10-14 ENCOUNTER — Inpatient Hospital Stay: Payer: BC Managed Care – PPO | Attending: Oncology

## 2021-10-14 ENCOUNTER — Encounter: Payer: Self-pay | Admitting: Oncology

## 2021-10-14 ENCOUNTER — Inpatient Hospital Stay (HOSPITAL_BASED_OUTPATIENT_CLINIC_OR_DEPARTMENT_OTHER): Payer: BC Managed Care – PPO | Admitting: Oncology

## 2021-10-14 ENCOUNTER — Inpatient Hospital Stay: Payer: BC Managed Care – PPO

## 2021-10-14 VITALS — BP 124/88 | HR 62 | Temp 98.3°F | Ht 65.0 in | Wt 166.0 lb

## 2021-10-14 VITALS — BP 126/81 | HR 60

## 2021-10-14 DIAGNOSIS — C8218 Follicular lymphoma grade II, lymph nodes of multiple sites: Secondary | ICD-10-CM

## 2021-10-14 DIAGNOSIS — Z5112 Encounter for antineoplastic immunotherapy: Secondary | ICD-10-CM | POA: Insufficient documentation

## 2021-10-14 DIAGNOSIS — C8200 Follicular lymphoma grade I, unspecified site: Secondary | ICD-10-CM | POA: Diagnosis not present

## 2021-10-14 DIAGNOSIS — Z79899 Other long term (current) drug therapy: Secondary | ICD-10-CM | POA: Diagnosis not present

## 2021-10-14 LAB — CBC WITH DIFFERENTIAL/PLATELET
Abs Immature Granulocytes: 0.02 10*3/uL (ref 0.00–0.07)
Basophils Absolute: 0.1 10*3/uL (ref 0.0–0.1)
Basophils Relative: 1 %
Eosinophils Absolute: 0.1 10*3/uL (ref 0.0–0.5)
Eosinophils Relative: 2 %
HCT: 45.5 % (ref 36.0–46.0)
Hemoglobin: 14.8 g/dL (ref 12.0–15.0)
Immature Granulocytes: 0 %
Lymphocytes Relative: 27 %
Lymphs Abs: 1.7 10*3/uL (ref 0.7–4.0)
MCH: 30.6 pg (ref 26.0–34.0)
MCHC: 32.5 g/dL (ref 30.0–36.0)
MCV: 94.2 fL (ref 80.0–100.0)
Monocytes Absolute: 0.7 10*3/uL (ref 0.1–1.0)
Monocytes Relative: 11 %
Neutro Abs: 3.7 10*3/uL (ref 1.7–7.7)
Neutrophils Relative %: 59 %
Platelets: 202 10*3/uL (ref 150–400)
RBC: 4.83 MIL/uL (ref 3.87–5.11)
RDW: 13.1 % (ref 11.5–15.5)
WBC: 6.2 10*3/uL (ref 4.0–10.5)
nRBC: 0 % (ref 0.0–0.2)

## 2021-10-14 LAB — COMPREHENSIVE METABOLIC PANEL
ALT: 29 U/L (ref 0–44)
AST: 20 U/L (ref 15–41)
Albumin: 3.8 g/dL (ref 3.5–5.0)
Alkaline Phosphatase: 44 U/L (ref 38–126)
Anion gap: 3 — ABNORMAL LOW (ref 5–15)
BUN: 16 mg/dL (ref 8–23)
CO2: 27 mmol/L (ref 22–32)
Calcium: 8.5 mg/dL — ABNORMAL LOW (ref 8.9–10.3)
Chloride: 109 mmol/L (ref 98–111)
Creatinine, Ser: 0.64 mg/dL (ref 0.44–1.00)
GFR, Estimated: >60 mL/min (ref >60)
Glucose, Bld: 100 mg/dL — ABNORMAL HIGH (ref 70–99)
Potassium: 4.7 mmol/L (ref 3.5–5.1)
Sodium: 139 mmol/L (ref 135–145)
Total Bilirubin: 1 mg/dL (ref 0.3–1.2)
Total Protein: 6.4 g/dL — ABNORMAL LOW (ref 6.5–8.1)

## 2021-10-14 MED ORDER — SODIUM CHLORIDE 0.9 % IV SOLN
375.0000 mg/m2 | Freq: Once | INTRAVENOUS | Status: AC
  Administered 2021-10-14: 700 mg via INTRAVENOUS
  Filled 2021-10-14: qty 50

## 2021-10-14 MED ORDER — ACETAMINOPHEN 325 MG PO TABS
650.0000 mg | ORAL_TABLET | Freq: Once | ORAL | Status: AC
  Administered 2021-10-14: 650 mg via ORAL
  Filled 2021-10-14: qty 2

## 2021-10-14 MED ORDER — DIPHENHYDRAMINE HCL 25 MG PO CAPS
50.0000 mg | ORAL_CAPSULE | Freq: Once | ORAL | Status: AC
  Administered 2021-10-14: 50 mg via ORAL
  Filled 2021-10-14: qty 2

## 2021-10-14 MED ORDER — SODIUM CHLORIDE 0.9 % IV SOLN
Freq: Once | INTRAVENOUS | Status: AC
  Filled 2021-10-14: qty 250

## 2021-10-14 MED ORDER — SODIUM CHLORIDE 0.9 % IV SOLN: 375.0000 mg/m2 | Freq: Once | INTRAVENOUS | Status: DC

## 2021-10-19 ENCOUNTER — Ambulatory Visit (INDEPENDENT_AMBULATORY_CARE_PROVIDER_SITE_OTHER): Payer: BC Managed Care – PPO | Admitting: *Deleted

## 2021-10-19 DIAGNOSIS — M810 Age-related osteoporosis without current pathological fracture: Secondary | ICD-10-CM

## 2021-10-19 MED ORDER — DENOSUMAB 60 MG/ML ~~LOC~~ SOSY
60.0000 mg | PREFILLED_SYRINGE | Freq: Once | SUBCUTANEOUS | Status: AC
  Administered 2021-10-19: 60 mg via SUBCUTANEOUS

## 2021-10-19 NOTE — Progress Notes (Signed)
Per orders of Dr. Danise Mina, injection of Prolia given by Tammi Sou. Patient tolerated injection well.

## 2021-10-20 ENCOUNTER — Other Ambulatory Visit: Payer: BC Managed Care – PPO

## 2021-10-20 ENCOUNTER — Ambulatory Visit: Payer: BC Managed Care – PPO | Admitting: Oncology

## 2021-10-20 ENCOUNTER — Ambulatory Visit: Payer: BC Managed Care – PPO

## 2021-10-21 ENCOUNTER — Ambulatory Visit: Payer: BC Managed Care – PPO

## 2021-10-21 ENCOUNTER — Other Ambulatory Visit: Payer: BC Managed Care – PPO

## 2021-10-21 ENCOUNTER — Ambulatory Visit: Payer: BC Managed Care – PPO | Admitting: Oncology

## 2021-12-13 ENCOUNTER — Ambulatory Visit
Admission: RE | Admit: 2021-12-13 | Discharge: 2021-12-13 | Disposition: A | Discharge status: Home / Self Care | Payer: BC Managed Care – PPO | Source: Ambulatory Visit | Attending: Oncology | Admitting: Oncology

## 2021-12-13 DIAGNOSIS — C8218 Follicular lymphoma grade II, lymph nodes of multiple sites: Secondary | ICD-10-CM | POA: Insufficient documentation

## 2021-12-13 DIAGNOSIS — C8210 Follicular lymphoma grade II, unspecified site: Secondary | ICD-10-CM | POA: Diagnosis not present

## 2021-12-13 DIAGNOSIS — C8200 Follicular lymphoma grade I, unspecified site: Secondary | ICD-10-CM | POA: Diagnosis not present

## 2021-12-13 DIAGNOSIS — K769 Liver disease, unspecified: Secondary | ICD-10-CM | POA: Diagnosis not present

## 2021-12-13 DIAGNOSIS — I771 Stricture of artery: Secondary | ICD-10-CM | POA: Diagnosis not present

## 2021-12-13 DIAGNOSIS — R918 Other nonspecific abnormal finding of lung field: Secondary | ICD-10-CM | POA: Diagnosis not present

## 2021-12-13 DIAGNOSIS — I700 Atherosclerosis of aorta: Secondary | ICD-10-CM | POA: Diagnosis not present

## 2021-12-13 DIAGNOSIS — R1900 Intra-abdominal and pelvic swelling, mass and lump, unspecified site: Secondary | ICD-10-CM | POA: Diagnosis not present

## 2021-12-13 LAB — POCT I-STAT CREATININE: Creatinine, Ser: 0.6 mg/dL (ref 0.44–1.00)

## 2021-12-13 MED ORDER — IOHEXOL 300 MG/ML  SOLN
100.0000 mL | Freq: Once | INTRAMUSCULAR | Status: AC | PRN
  Administered 2021-12-13: 100 mL via INTRAVENOUS

## 2021-12-15 ENCOUNTER — Encounter: Payer: Self-pay | Admitting: Oncology

## 2021-12-15 ENCOUNTER — Inpatient Hospital Stay: Payer: BC Managed Care – PPO

## 2021-12-15 ENCOUNTER — Inpatient Hospital Stay: Payer: BC Managed Care – PPO | Attending: Oncology | Admitting: Oncology

## 2021-12-15 VITALS — BP 124/73 | HR 65

## 2021-12-15 DIAGNOSIS — C8218 Follicular lymphoma grade II, lymph nodes of multiple sites: Secondary | ICD-10-CM

## 2021-12-15 DIAGNOSIS — C8200 Follicular lymphoma grade I, unspecified site: Secondary | ICD-10-CM | POA: Insufficient documentation

## 2021-12-15 DIAGNOSIS — Z5112 Encounter for antineoplastic immunotherapy: Secondary | ICD-10-CM | POA: Diagnosis not present

## 2021-12-15 DIAGNOSIS — Z79899 Other long term (current) drug therapy: Secondary | ICD-10-CM | POA: Insufficient documentation

## 2021-12-15 LAB — COMPREHENSIVE METABOLIC PANEL
ALT: 14 U/L (ref 0–44)
AST: 17 U/L (ref 15–41)
Albumin: 3.9 g/dL (ref 3.5–5.0)
Alkaline Phosphatase: 60 U/L (ref 38–126)
Anion gap: 5 (ref 5–15)
BUN: 18 mg/dL (ref 8–23)
CO2: 26 mmol/L (ref 22–32)
Calcium: 8.7 mg/dL — ABNORMAL LOW (ref 8.9–10.3)
Chloride: 105 mmol/L (ref 98–111)
Creatinine, Ser: 0.6 mg/dL (ref 0.44–1.00)
GFR, Estimated: >60 mL/min (ref >60)
Glucose, Bld: 95 mg/dL (ref 70–99)
Potassium: 4.6 mmol/L (ref 3.5–5.1)
Sodium: 136 mmol/L (ref 135–145)
Total Bilirubin: 0.8 mg/dL (ref 0.3–1.2)
Total Protein: 6.8 g/dL (ref 6.5–8.1)

## 2021-12-15 LAB — CBC WITH DIFFERENTIAL/PLATELET
Abs Immature Granulocytes: 0.03 10*3/uL (ref 0.00–0.07)
Basophils Absolute: 0 10*3/uL (ref 0.0–0.1)
Basophils Relative: 1 %
Eosinophils Absolute: 0.1 10*3/uL (ref 0.0–0.5)
Eosinophils Relative: 2 %
HCT: 43.6 % (ref 36.0–46.0)
Hemoglobin: 14.4 g/dL (ref 12.0–15.0)
Immature Granulocytes: 1 %
Lymphocytes Relative: 26 %
Lymphs Abs: 1.6 10*3/uL (ref 0.7–4.0)
MCH: 30.3 pg (ref 26.0–34.0)
MCHC: 33 g/dL (ref 30.0–36.0)
MCV: 91.6 fL (ref 80.0–100.0)
Monocytes Absolute: 0.7 10*3/uL (ref 0.1–1.0)
Monocytes Relative: 12 %
Neutro Abs: 3.7 10*3/uL (ref 1.7–7.7)
Neutrophils Relative %: 58 %
Platelets: 266 10*3/uL (ref 150–400)
RBC: 4.76 MIL/uL (ref 3.87–5.11)
RDW: 12.8 % (ref 11.5–15.5)
WBC: 6.2 10*3/uL (ref 4.0–10.5)
nRBC: 0 % (ref 0.0–0.2)

## 2021-12-15 MED ORDER — SODIUM CHLORIDE 0.9 % IV SOLN
375.0000 mg/m2 | Freq: Once | INTRAVENOUS | Status: AC
  Administered 2021-12-15: 700 mg via INTRAVENOUS
  Filled 2021-12-15: qty 50

## 2021-12-15 MED ORDER — SODIUM CHLORIDE 0.9 % IV SOLN: 375.0000 mg/m2 | Freq: Once | INTRAVENOUS | Status: DC

## 2021-12-15 MED ORDER — ACETAMINOPHEN 325 MG PO TABS
650.0000 mg | ORAL_TABLET | Freq: Once | ORAL | Status: AC
  Administered 2021-12-15: 650 mg via ORAL
  Filled 2021-12-15: qty 2

## 2021-12-15 MED ORDER — SODIUM CHLORIDE 0.9% FLUSH
10.0000 mL | INTRAVENOUS | Status: DC | PRN
  Administered 2021-12-15: 10 mL
  Filled 2021-12-15: qty 10

## 2021-12-15 MED ORDER — SODIUM CHLORIDE 0.9 % IV SOLN
Freq: Once | INTRAVENOUS | Status: AC
  Filled 2021-12-15: qty 250

## 2021-12-15 MED ORDER — DIPHENHYDRAMINE HCL 25 MG PO CAPS
50.0000 mg | ORAL_CAPSULE | Freq: Once | ORAL | Status: AC
  Administered 2021-12-15: 50 mg via ORAL
  Filled 2021-12-15: qty 2

## 2021-12-15 NOTE — Progress Notes (Signed)
[place]  [phone]) [phone] [phone]) [phone]  ID: Melinda Hall OB: 04-07-1960  MR#: [id number]  CBU#:[id number]  Patient Care Team: [name], MD as PCP - General (Family Medicine) [name], MD as Consulting Physician (Oncology) [name], MD as Consulting Physician (Neurosurgery)  CHIEF COMPLAINT: Recurrent stage III follicular lymphoma, grade 1/2.    INTERVAL HISTORY: Patient returns to clinic today for further evaluation, discussion of her imaging results, and consideration of cycle 3 of maintenance Rituxan.  She continues to feel well and remains asymptomatic.  She is tolerating her treatments without significant side effects.  She does not complain of any weakness or fatigue today.  She has no night sweats.  She denies any fevers or weight loss.  She does not complain of any abdominal discomfort today.  She has no neurologic complaints. She has no chest pain, shortness of breath, cough, or hemoptysis.  She denies any nausea, vomiting, constipation, or diarrhea.  She has no urinary complaints.  Patient offers no specific complaints today.  REVIEW OF SYSTEMS:   Review of Systems  Constitutional: Negative.  Negative for diaphoresis, fever, malaise/fatigue and weight loss.  Respiratory: Negative.  Negative for cough, hemoptysis and shortness of breath.   Cardiovascular: Negative.  Negative for chest pain and leg swelling.  Gastrointestinal: Negative.  Negative for abdominal pain.  Genitourinary: Negative.  Negative for dysuria.  Musculoskeletal: Negative.  Negative for back pain and myalgias.  Skin: Negative.  Negative for rash.  Neurological: Negative.  Negative for tingling, sensory change, focal weakness, weakness and headaches.  Psychiatric/Behavioral: Negative.  Negative for depression. The patient is not nervous/anxious.     As per HPI. Otherwise, a complete review of systems is negative.  PAST MEDICAL HISTORY: Past Medical  History:  Diagnosis Date   Cancer ([place])    Dermoid tumor    hx of--left ovary   Follicular lymphoma ([place])    Gastric bypass status for obesity 2004   s/p roux-en-y   Generalized headaches    frequent   Heart murmur    History of cardiac murmur    Intraductal papilloma of breast, left 06/18/2017   05/2017 - s/p excision 07/2017   Osteoporosis 03/2016   DEXA -2.6 spine   Salivary gland stone    x 2   Smoker     PAST SURGICAL HISTORY: Past Surgical History:  Procedure Laterality Date   ABDOMINAL HYSTERECTOMY     only ovary was removed   ANTERIOR CERVICAL DECOMP/DISCECTOMY FUSION  12/2016   [place]  ~1992   for dermoid ovarian cysts   BREAST BIOPSY Left 06/13/2017   papilloma with excision 07/2017   BREAST LUMPECTOMY WITH NEEDLE LOCALIZATION Left 08/10/2017   intraductal papilloma - Caroleen Hamman F, MD   CHOLECYSTECTOMY  2000s   COLONOSCOPY WITH PROPOFOL N/A 11/27/2015   TA rpt 5 yrs Lucilla Lame, MD)   COLONOSCOPY WITH PROPOFOL N/A 05/17/2021   TAs, SSPs, rpt 3 yrs Allen Norris, Darren, MD)   GASTRIC BYPASS  2004   roux en y [facility])   POLYPECTOMY  11/27/2015   Procedure: POLYPECTOMY;  Surgeon: Lucilla Lame, MD;  Location: [place];  Service: Endoscopy;;   POLYPECTOMY  05/17/2021   Procedure: POLYPECTOMY;  Surgeon: Lucilla Lame, MD;  Location: [place];  Service: Endoscopy;;   REDUCTION MAMMAPLASTY Bilateral 1990's   SALIVARY GLAND SURGERY     SALIVARY STONE REMOVAL      FAMILY HISTORY: Family  History  Problem Relation Age of Onset   Cancer Father 35       leukemia   Hypertension Mother    Diabetes Neg Hx    Stroke Neg Hx    Coronary artery disease Neg Hx    Breast cancer Neg Hx     ADVANCED DIRECTIVES (Y/N):  N  HEALTH MAINTENANCE: Social History   Tobacco Use   Smoking status: Former    Packs/day: 0.25    Years: 20.00    Total pack years: 5.00    Types: Cigarettes    Quit date: 10/31/2012     Years since quitting: 9.1   Smokeless tobacco: Never  Vaping Use   Vaping Use: Never used  Substance Use Topics   Alcohol use: Not Currently   Drug use: No     Colonoscopy:  PAP:  Bone density:  Lipid panel:  Allergies  Allergen Reactions   Boniva [Ibandronic Acid] Other (See Comments)    Diffuse body aches    Current Outpatient Medications  Medication Sig Dispense Refill   buPROPion ER (WELLBUTRIN SR) 100 MG 12 hr tablet TAKE 1 TABLET (100 MG TOTAL) BY MOUTH IN THE MORNING 90 tablet 2   calcium carbonate (OS-CAL) 600 MG TABS tablet Take 600 mg by mouth daily with breakfast.     Cholecalciferol (VITAMIN D3) 1000 units CAPS Take 3 capsules (3,000 Units total) by mouth daily.     denosumab (PROLIA) 60 MG/ML SOSY injection PRESCRIBER TO INJECT '60MG'$  SUBCUTANEOUSLY EVERY 6 MONTHS 1 mL 0   ferrous sulfate 325 (65 FE) MG tablet Take 1 tablet (325 mg total) by mouth every Monday, Wednesday, and Friday.     Magnesium 250 MG TABS Take 1 tablet (250 mg total) by mouth daily.  0   vitamin B-12 (CYANOCOBALAMIN) 1000 MCG tablet Take 1 tablet (1,000 mcg total) by mouth daily.     zinc gluconate 50 MG tablet Take 1 tablet (50 mg total) by mouth daily.     No current facility-administered medications for this visit.   Facility-Administered Medications Ordered in Other Visits  Medication Dose Route Frequency Provider Last Rate Last Admin   sodium chloride flush (NS) 0.9 % injection 10 mL  10 mL Intracatheter PRN [name], MD   10 mL at 12/15/21 1108    OBJECTIVE: Vitals:   12/15/21 0938  BP: 123/87  Pulse: 68  Resp: 16  Temp: 97.9 F (36.6 C)  SpO2: 100%     Body mass index is 27.42 kg/m.    ECOG FS:0 - Asymptomatic  General: Well-developed, well-nourished, no acute distress. Eyes: Pink conjunctiva, anicteric sclera. HEENT: Normocephalic, moist mucous membranes. Lungs: No audible wheezing or coughing. Heart: Regular rate and rhythm. Abdomen: Soft, nontender, no obvious  distention. Musculoskeletal: No edema, cyanosis, or clubbing. Neuro: Alert, answering all questions appropriately. Cranial nerves grossly intact. Skin: No rashes or petechiae noted. Psych: Normal affect.  LAB RESULTS:  Lab Results  Component Value Date   NA 136 12/15/2021   K 4.6 12/15/2021   CL 105 12/15/2021   CO2 26 12/15/2021   GLUCOSE 95 12/15/2021   BUN 18 12/15/2021   CREATININE 0.60 12/15/2021   CALCIUM 8.7 (L) 12/15/2021   PROT 6.8 12/15/2021   ALBUMIN 3.9 12/15/2021   AST 17 12/15/2021   ALT 14 12/15/2021   ALKPHOS 60 12/15/2021   BILITOT 0.8 12/15/2021   GFRNONAA >60 12/15/2021   GFRAA >60 07/10/2013    Lab Results  Component Value Date  WBC 6.2 12/15/2021   NEUTROABS 3.7 12/15/2021   HGB 14.4 12/15/2021   HCT 43.6 12/15/2021   MCV 91.6 12/15/2021   PLT 266 12/15/2021     STUDIES: CT CHEST ABDOMEN PELVIS W CONTRAST  Result Date: 12/13/2021 CLINICAL DATA:  Grade 2 follicular lymphoma.  * Tracking Code: BO * EXAM: CT CHEST, ABDOMEN, AND PELVIS WITH CONTRAST TECHNIQUE: Multidetector CT imaging of the chest, abdomen and pelvis was performed following the standard protocol during bolus administration of intravenous contrast. RADIATION DOSE REDUCTION: This exam was performed according to the departmental dose-optimization program which includes automated exposure control, adjustment of the mA and/or kV according to patient size and/or use of iterative reconstruction technique. CONTRAST:  198m OMNIPAQUE IOHEXOL 300 MG/ML  SOLN COMPARISON:  06/02/2021 FINDINGS: CT CHEST FINDINGS Cardiovascular: Aortic atherosclerosis. Tortuous thoracic aorta. Borderline cardiomegaly. No central pulmonary embolism, on this non-dedicated study. Mediastinum/Nodes: No supraclavicular adenopathy. No axillary adenopathy. No mediastinal or hilar adenopathy. Lungs/Pleura: No pleural fluid. Development of innumerable bilateral ground-glass opacities throughout all lobes. Most confluent in the  right lower lobe, including medially on 57/4. Musculoskeletal: No acute osseous abnormality. CT ABDOMEN PELVIS FINDINGS Hepatobiliary: Tiny hepatic low-density lesions are similar, likely cysts. Nonspecific caudate lobe enlargement. Cholecystectomy, without biliary ductal dilatation. Pancreas: Normal, without mass or ductal dilatation. Spleen: Normal in size, without focal abnormality. Adrenals/Urinary Tract: Normal adrenal glands. Bilateral too small to characterize renal lesions are most likely cysts . In the absence of clinically indicated signs/symptoms require(s) no independent follow-up. No hydronephrosis. Normal urinary bladder. Stomach/Bowel: Surgical changes of Roux-en-Y gastric bypass with antecolic Roux loop. Otherwise normal stomach and small bowel. Normal colon and terminal ileum. Vascular/Lymphatic: Aortic atherosclerosis. Retrocaval nodal mass measures 3.5 x 2.6 cm on 59/2 versus 3.7 x 2.3 cm on the prior exam. 4.5 cm craniocaudal on coronal image 67 today versus similar on the prior exam (when remeasured). No new abdominal retroperitoneal adenopathy. No pelvic sidewall adenopathy. Reproductive: Normal uterus and adnexa. Other: No significant free fluid. No free intraperitoneal air. No evidence of omental or peritoneal disease. Musculoskeletal: Tiny right iliac sclerotic lesion is unchanged and likely a bone island. No acute osseous abnormality. IMPRESSION: 1. Similar isolated abdominal retrocaval nodal mass. No new or progressive lymphoma. 2. Development of multifocal, bilateral pulmonary ground-glass opacities. Differential considerations include atypical infection or drug toxicity. 3.  Aortic Atherosclerosis (ICD10-I70.0). Electronically Signed   By: KAbigail MiyamotoM.D.   On: 12/13/2021 14:46    ONCOLOGY HISTORY: Retroperitoneal lymph node biopsy on April 21, 2020 confirmed the diagnosis. PET scan on April 14, 2020 revealed a hypermetabolic right sided retroperitoneal nodal mass along with  hypermetabolic mesenteric adenopathy.  Patient also noted to have bilateral axillary adenopathy.  Although patient was asymptomatic and active surveillance was offered, she elected to pursue treatment and received IV Rituxan weekly x4 completing on May 28, 2020.  Repeat PET scan from August 24, 2020 reviewed independently with significant treatment response, although likely residual disease.  Patient previously declined maintenance Rituxan.   ASSESSMENT: Recurrent stage III follicular lymphoma, grade 1/2.    PLAN:    1.  Recurrent stage III follicular lymphoma, grade 1/2:  Repeat imaging on March 15, 2021 reviewed independently with progression of disease.  Patient subsequently reinitiated treatment with weekly Rituxan x4 followed by maintenance Rituxan every 8 weeks for up to 2 years.  Can also consider Zevalin in the future if necessary.  Repeat imaging on December 13, 2021 reviewed independently and reported as above with no obvious evidence  of recurrent or progressive disease.  Proceed with cycle 3 of maintenance Rituxan today.  Return to clinic in 8 weeks for further evaluation and consideration of cycle 4.  Repeat imaging in May 2024.    2.  Hypermetabolic thyroid nodule: Previously, biopsy was negative for malignancy. 3.  Elevated liver enzymes: Resolved. 4.  Polycythemia: Resolved. 5.  Myalgias: Patient does not complain of this today.  Continue ibuprofen as needed. 6.  Hyperkalemia: Resolved. 7.  Depression: Significantly improved.  Continue current medications as prescribed.  I spent a total of 30 minutes reviewing chart data, face-to-face evaluation with the patient, counseling and coordination of care as detailed above.    Patient expressed understanding and was in agreement with this plan. She also understands that She can call clinic at any time with any questions, concerns, or complaints.    Cancer Staging  Follicular lymphoma ([place]) Staging form: Hodgkin and Non-Hodgkin  Lymphoma, AJCC 8th Edition - Clinical stage from 6/75/9163: Stage III (Follicular lymphoma) - Signed by [name], MD on 04/29/2020 Stage prefix: Initial diagnosis   [name], MD   12/15/2021 1:42 PM

## 2021-12-15 NOTE — Patient Instructions (Signed)
Instrucciones al darle de alta: Discharge Instructions Gracias por elegir al [facility] de Cncer de [place] para brindarle atencin mdica de oncologa y Music therapist.   Si usted tiene una cita de laboratorio con [place], por favor vaya directamente [place] y regstrese en el rea de Control and instrumentation engineer.   Use ropa cmoda y [place] para tener fcil acceso a las vas del Portacath (acceso venoso de Engineer, site duracin) o la lnea PICC (catter central colocado por va perifrica).   Nos esforzamos por ofrecerle tiempo de calidad con su proveedor. Es posible que tenga que volver a programar su cita si llega tarde (15 minutos o ms).  El llegar tarde le afecta a usted y a otros pacientes cuyas citas son posteriores a Merchandiser, retail.  Adems, si usted falta a tres o ms citas sin avisar a la oficina, puede ser retirado(a) de la clnica a discrecin del proveedor.      Para las solicitudes de renovacin de recetas, pida a su farmacia que se ponga en contacto con nuestra oficina y deje que transcurran 66 horas para que se complete el proceso de las renovaciones.    Hoy usted recibi los siguientes agentes de quimioterapia e/o inmunoterapia: riTUXima   Para ayudar a prevenir las nuseas y los vmitos despus de su tratamiento, le recomendamos que tome su medicamento para las nuseas segn las indicaciones.  LOS SNTOMAS QUE DEBEN COMUNICARSE INMEDIATAMENTE SE INDICAN A CONTINUACIN: *FIEBRE SUPERIOR A 100.4 F (38 C) O MS *ESCALOFROS O SUDORACIN *NUSEAS Y VMITOS QUE NO SE CONTROLAN CON EL MEDICAMENTO PARA LAS NUSEAS *DIFICULTAD INUSUAL PARA RESPIRAR  *MORETONES O HEMORRAGIAS NO HABITUALES *PROBLEMAS URINARIOS (dolor o ardor al Garment/textile technologist o frecuencia para Garment/textile technologist) *PROBLEMAS INTESTINALES (diarrea inusual, estreimiento, dolor cerca del ano) SENSIBILIDAD EN LA BOCA Y EN LA GARGANTA CON O SIN LA PRESENCIA DE LCERAS (dolor de garganta, llagas en la boca o dolor de muelas/dientes) ERUPCIN,  HINCHAZN O DOLORES INUSUALES FLUJO VAGINAL INUSUAL O PICAZN/RASQUIA    Los puntos marcados con un asterisco ( *) indican una posible emergencia y debe hacer un seguimiento tan pronto como le sea posible o vaya al Departamento de Emergencias si se le presenta algn problema.  Por favor, muestre la [place] DE ADVERTENCIA DE Windy Canny DE ADVERTENCIA DE Benay Spice al registrarse en [address] de Emergencias y a la enfermera de triaje.  Si tiene preguntas despus de su visita o necesita cancelar o volver a programar su cita, por favor pngase en contacto con [facility] CANCER CTR AT [place]-MEDICAL ONCOLOGY  [phone]  y [place] instrucciones. Las horas de oficina son de 8:00 a.m. a 4:30 p.m. de lunes a viernes. Por favor, tenga en cuenta que los mensajes de voz que se dejan despus de las 4:00 p.m. posiblemente no se devolvern hasta el siguiente da de [place].  Cerramos los fines de semana y [employer]. En todo momento tiene acceso a una enfermera para preguntas urgentes. Por favor, llame al nmero principal de la clnica  [phone] y [place] instrucciones.   Para cualquier pregunta que no sea de carcter urgente, tambin puede ponerse en contacto con su proveedor [employer] Ahora ofrecemos visitas electrnicas para cualquier persona mayor de 18 aos que solicite atencin mdica en lnea para los sntomas que no sean urgentes. Para ms detalles vaya a [email or web address].   Tambin puede bajar la aplicacin de MyChart! Vaya a la tienda de aplicaciones, busque "MyChart", abra la aplicacin, seleccione [employer], e  ingrese con su nombre de usuario y la contrasea de Pharmacist, community.  Las mscaras son opcionales en los centros de Hotel manager. Si desea que su equipo de cuidados mdicos use una [employer] atienden, por favor hgaselo saber al personal. Melinda Hall una persona de apoyo que tenga por lo menos 16 aos para que le acompae a sus  citas.

## 2022-01-13 ENCOUNTER — Ambulatory Visit: Payer: BC Managed Care – PPO | Admitting: Dermatology

## 2022-02-09 ENCOUNTER — Inpatient Hospital Stay: Payer: BC Managed Care – PPO

## 2022-02-09 ENCOUNTER — Inpatient Hospital Stay: Payer: BC Managed Care – PPO | Attending: Oncology

## 2022-02-09 ENCOUNTER — Encounter: Payer: Self-pay | Admitting: Oncology

## 2022-02-09 ENCOUNTER — Inpatient Hospital Stay (HOSPITAL_BASED_OUTPATIENT_CLINIC_OR_DEPARTMENT_OTHER): Payer: BC Managed Care – PPO | Admitting: Oncology

## 2022-02-09 VITALS — BP 116/80 | HR 66 | Temp 97.5°F | Resp 18

## 2022-02-09 DIAGNOSIS — C8218 Follicular lymphoma grade II, lymph nodes of multiple sites: Secondary | ICD-10-CM

## 2022-02-09 DIAGNOSIS — C8200 Follicular lymphoma grade I, unspecified site: Secondary | ICD-10-CM | POA: Diagnosis not present

## 2022-02-09 DIAGNOSIS — Z5112 Encounter for antineoplastic immunotherapy: Secondary | ICD-10-CM | POA: Insufficient documentation

## 2022-02-09 DIAGNOSIS — Z79899 Other long term (current) drug therapy: Secondary | ICD-10-CM | POA: Diagnosis not present

## 2022-02-09 LAB — CBC WITH DIFFERENTIAL/PLATELET
Abs Immature Granulocytes: 0.03 10*3/uL (ref 0.00–0.07)
Basophils Absolute: 0 10*3/uL (ref 0.0–0.1)
Basophils Relative: 1 %
Eosinophils Absolute: 0.2 10*3/uL (ref 0.0–0.5)
Eosinophils Relative: 2 %
HCT: 43.5 % (ref 36.0–46.0)
Hemoglobin: 14.6 g/dL (ref 12.0–15.0)
Immature Granulocytes: 0 %
Lymphocytes Relative: 19 %
Lymphs Abs: 1.5 10*3/uL (ref 0.7–4.0)
MCH: 30.1 pg (ref 26.0–34.0)
MCHC: 33.6 g/dL (ref 30.0–36.0)
MCV: 89.7 fL (ref 80.0–100.0)
Monocytes Absolute: 0.9 10*3/uL (ref 0.1–1.0)
Monocytes Relative: 11 %
Neutro Abs: 5.2 10*3/uL (ref 1.7–7.7)
Neutrophils Relative %: 67 %
Platelets: 231 10*3/uL (ref 150–400)
RBC: 4.85 MIL/uL (ref 3.87–5.11)
RDW: 13.8 % (ref 11.5–15.5)
WBC: 7.8 10*3/uL (ref 4.0–10.5)
nRBC: 0 % (ref 0.0–0.2)

## 2022-02-09 LAB — COMPREHENSIVE METABOLIC PANEL
ALT: 20 U/L (ref 0–44)
AST: 20 U/L (ref 15–41)
Albumin: 3.8 g/dL (ref 3.5–5.0)
Alkaline Phosphatase: 71 U/L (ref 38–126)
Anion gap: 6 (ref 5–15)
BUN: 20 mg/dL (ref 8–23)
CO2: 26 mmol/L (ref 22–32)
Calcium: 8.8 mg/dL — ABNORMAL LOW (ref 8.9–10.3)
Chloride: 105 mmol/L (ref 98–111)
Creatinine, Ser: 0.64 mg/dL (ref 0.44–1.00)
GFR, Estimated: >60 mL/min (ref >60)
Glucose, Bld: 95 mg/dL (ref 70–99)
Potassium: 4.2 mmol/L (ref 3.5–5.1)
Sodium: 137 mmol/L (ref 135–145)
Total Bilirubin: 0.6 mg/dL (ref 0.3–1.2)
Total Protein: 6.3 g/dL — ABNORMAL LOW (ref 6.5–8.1)

## 2022-02-09 MED ORDER — ACETAMINOPHEN 325 MG PO TABS
650.0000 mg | ORAL_TABLET | Freq: Once | ORAL | Status: AC
  Administered 2022-02-09: 650 mg via ORAL
  Filled 2022-02-09: qty 2

## 2022-02-09 MED ORDER — DIPHENHYDRAMINE HCL 25 MG PO CAPS
50.0000 mg | ORAL_CAPSULE | Freq: Once | ORAL | Status: AC
  Administered 2022-02-09: 50 mg via ORAL
  Filled 2022-02-09: qty 2

## 2022-02-09 MED ORDER — SODIUM CHLORIDE 0.9 % IV SOLN: 375.0000 mg/m2 | Freq: Once | INTRAVENOUS | Status: DC

## 2022-02-09 MED ORDER — SODIUM CHLORIDE 0.9 % IV SOLN
375.0000 mg/m2 | Freq: Once | INTRAVENOUS | Status: AC
  Administered 2022-02-09: 700 mg via INTRAVENOUS
  Filled 2022-02-09: qty 50

## 2022-02-09 MED ORDER — SODIUM CHLORIDE 0.9 % IV SOLN
Freq: Once | INTRAVENOUS | Status: AC
  Filled 2022-02-09: qty 250

## 2022-02-09 NOTE — Patient Instructions (Signed)
Instrucciones al darle de alta: Discharge Instructions Gracias por elegir al [facility] de Cncer de [place] para brindarle atencin mdica de oncologa y Music therapist.   Si usted tiene una cita de laboratorio con [place], por favor vaya directamente [place] y regstrese en el rea de Control and instrumentation engineer.   Use ropa cmoda y [place] para tener fcil acceso a las vas del Portacath (acceso venoso de Engineer, site duracin) o la lnea PICC (catter central colocado por va perifrica).   Nos esforzamos por ofrecerle tiempo de calidad con su proveedor. Es posible que tenga que volver a programar su cita si llega tarde (15 minutos o ms).  El llegar tarde le afecta a usted y a otros pacientes cuyas citas son posteriores a Merchandiser, retail.  Adems, si usted falta a tres o ms citas sin avisar a la oficina, puede ser retirado(a) de la clnica a discrecin del proveedor.      Para las solicitudes de renovacin de recetas, pida a su farmacia que se ponga en contacto con nuestra oficina y deje que transcurran 23 horas para que se complete el proceso de las renovaciones.    Hoy usted recibi los siguientes agentes de quimioterapia e/o inmunoterapia Rituximab      Para ayudar a prevenir las nuseas y los vmitos despus de su tratamiento, le recomendamos que tome su medicamento para las nuseas segn las indicaciones.  LOS SNTOMAS QUE DEBEN COMUNICARSE INMEDIATAMENTE SE INDICAN A CONTINUACIN: *FIEBRE SUPERIOR A 100.4 F (38 C) O MS *ESCALOFROS O SUDORACIN *NUSEAS Y VMITOS QUE NO SE CONTROLAN CON EL MEDICAMENTO PARA LAS NUSEAS *DIFICULTAD INUSUAL PARA RESPIRAR  *MORETONES O HEMORRAGIAS NO HABITUALES *PROBLEMAS URINARIOS (dolor o ardor al Garment/textile technologist o frecuencia para Garment/textile technologist) *PROBLEMAS INTESTINALES (diarrea inusual, estreimiento, dolor cerca del ano) SENSIBILIDAD EN LA BOCA Y EN LA GARGANTA CON O SIN LA PRESENCIA DE LCERAS (dolor de garganta, llagas en la boca o dolor de muelas/dientes) ERUPCIN,  HINCHAZN O DOLORES INUSUALES FLUJO VAGINAL INUSUAL O PICAZN/RASQUIA    Los puntos marcados con un asterisco ( *) indican una posible emergencia y debe hacer un seguimiento tan pronto como le sea posible o vaya al Departamento de Emergencias si se le presenta algn problema.  Por favor, muestre la [place] DE ADVERTENCIA DE Windy Canny DE ADVERTENCIA DE Benay Spice al registrarse en [address] de Emergencias y a la enfermera de triaje.  Si tiene preguntas despus de su visita o necesita cancelar o volver a programar su cita, por favor pngase en contacto con [facility] CANCER CTR AT [place]-MEDICAL ONCOLOGY  [phone]  y [place] instrucciones. Las horas de oficina son de 8:00 a.m. a 4:30 p.m. de lunes a viernes. Por favor, tenga en cuenta que los mensajes de voz que se dejan despus de las 4:00 p.m. posiblemente no se devolvern hasta el siguiente da de [place].  Cerramos los fines de semana y [employer]. En todo momento tiene acceso a una enfermera para preguntas urgentes. Por favor, llame al nmero principal de la clnica  [phone] y [place] instrucciones.   Para cualquier pregunta que no sea de carcter urgente, tambin puede ponerse en contacto con su proveedor [employer]. Ahora ofrecemos visitas electrnicas para cualquier persona mayor de 18 aos que solicite atencin mdica en lnea para los sntomas que no sean urgentes. Para ms detalles vaya a mychart.[email or web address].   Tambin puede bajar la aplicacin de MyChart! Vaya a la tienda de aplicaciones, busque "MyChart", abra la aplicacin, seleccione  [place], e ingrese con su nombre de usuario y la contrasea de Pharmacist, community.

## 2022-02-09 NOTE — Progress Notes (Signed)
Pt here for her Rituxan treatment. Feeling well.

## 2022-02-09 NOTE — Progress Notes (Signed)
[place]  Telephone:([phone]) [phone] Fax:(336) [phone]  ID: Melinda Hall OB: [birth year]  MR#: [id number]  AYT#:[id number]  Patient Care Team: [name], MD as PCP - General (Family Medicine) [name], MD as Consulting Physician (Oncology) [name], MD as Consulting Physician (Neurosurgery)  CHIEF COMPLAINT: Recurrent stage III follicular lymphoma, grade 1/2.    INTERVAL HISTORY: Patient returns to clinic today for further evaluation and consideration of cycle 4 of maintenance Rituxan.  She continues to feel well and remains asymptomatic.  She is tolerating her treatments without significant side effects. She does not complain of any weakness or fatigue today.  She has no night sweats.  She denies any fevers or weight loss.  She does not complain of any abdominal discomfort today.  She has no neurologic complaints. She has no chest pain, shortness of breath, cough, or hemoptysis.  She denies any nausea, vomiting, constipation, or diarrhea.  She has no urinary complaints.  Patient offers no specific complaints today.  REVIEW OF SYSTEMS:   Review of Systems  Constitutional: Negative.  Negative for diaphoresis, fever, malaise/fatigue and weight loss.  Respiratory: Negative.  Negative for cough, hemoptysis and shortness of breath.   Cardiovascular: Negative.  Negative for chest pain and leg swelling.  Gastrointestinal: Negative.  Negative for abdominal pain.  Genitourinary: Negative.  Negative for dysuria.  Musculoskeletal: Negative.  Negative for back pain and myalgias.  Skin: Negative.  Negative for rash.  Neurological: Negative.  Negative for tingling, sensory change, focal weakness, weakness and headaches.  Psychiatric/Behavioral: Negative.  Negative for depression. The patient is not nervous/anxious.     As per HPI. Otherwise, a complete review of systems is negative.  PAST MEDICAL HISTORY: Past Medical History:  Diagnosis Date   Cancer  ([place])    Dermoid tumor    hx of--left ovary   Follicular lymphoma ([place])    Gastric bypass status for obesity 2004   s/p roux-en-y   Generalized headaches    frequent   Heart murmur    History of cardiac murmur    Intraductal papilloma of breast, left 06/18/2017   05/2017 - s/p excision 07/2017   Osteoporosis 03/2016   DEXA -2.6 spine   Salivary gland stone    x 2   Smoker     PAST SURGICAL HISTORY: Past Surgical History:  Procedure Laterality Date   ABDOMINAL HYSTERECTOMY     only ovary was removed   ANTERIOR CERVICAL DECOMP/DISCECTOMY FUSION  12/2016   [place]  ~1992   for dermoid ovarian cysts   BREAST BIOPSY Left 06/13/2017   papilloma with excision 07/2017   BREAST LUMPECTOMY WITH NEEDLE LOCALIZATION Left 08/10/2017   intraductal papilloma - Caroleen Hamman F, MD   CHOLECYSTECTOMY  2000s   COLONOSCOPY WITH PROPOFOL N/A 11/27/2015   TA rpt 5 yrs Lucilla Lame, MD)   COLONOSCOPY WITH PROPOFOL N/A 05/17/2021   TAs, SSPs, rpt 3 yrs Allen Norris, Darren, MD)   GASTRIC BYPASS  2004   roux en y [facility])   POLYPECTOMY  11/27/2015   Procedure: POLYPECTOMY;  Surgeon: Lucilla Lame, MD;  Location: [facility];  Service: Endoscopy;;   POLYPECTOMY  05/17/2021   Procedure: POLYPECTOMY;  Surgeon: Lucilla Lame, MD;  Location: [facility];  Service: Endoscopy;;   REDUCTION MAMMAPLASTY Bilateral 1990's   SALIVARY GLAND SURGERY     SALIVARY STONE REMOVAL      FAMILY HISTORY: Family History  Problem Relation Age of  Onset   Cancer Father 42       leukemia   Hypertension Mother    Diabetes Neg Hx    Stroke Neg Hx    Coronary artery disease Neg Hx    Breast cancer Neg Hx     ADVANCED DIRECTIVES (Y/N):  N  HEALTH MAINTENANCE: Social History   Tobacco Use   Smoking status: Former    Packs/day: 0.25    Years: 20.00    Total pack years: 5.00    Types: Cigarettes    Quit date: 10/31/2012    Years since quitting: 9.2    Smokeless tobacco: Never  Vaping Use   Vaping Use: Never used  Substance Use Topics   Alcohol use: Not Currently   Drug use: No     Colonoscopy:  PAP:  Bone density:  Lipid panel:  Allergies  Allergen Reactions   Boniva [Ibandronic Acid] Other (See Comments)    Diffuse body aches    Current Outpatient Medications  Medication Sig Dispense Refill   buPROPion ER (WELLBUTRIN SR) 100 MG 12 hr tablet TAKE 1 TABLET (100 MG TOTAL) BY MOUTH IN THE MORNING 90 tablet 2   calcium carbonate (OS-CAL) 600 MG TABS tablet Take 600 mg by mouth daily with breakfast.     Cholecalciferol (VITAMIN D3) 1000 units CAPS Take 3 capsules (3,000 Units total) by mouth daily.     denosumab (PROLIA) 60 MG/ML SOSY injection PRESCRIBER TO INJECT '60MG'$  SUBCUTANEOUSLY EVERY 6 MONTHS 1 mL 0   ferrous sulfate 325 (65 FE) MG tablet Take 1 tablet (325 mg total) by mouth every Monday, Wednesday, and Friday.     Magnesium 250 MG TABS Take 1 tablet (250 mg total) by mouth daily.  0   vitamin B-12 (CYANOCOBALAMIN) 1000 MCG tablet Take 1 tablet (1,000 mcg total) by mouth daily.     zinc gluconate 50 MG tablet Take 1 tablet (50 mg total) by mouth daily.     No current facility-administered medications for this visit.    OBJECTIVE: Vitals:   02/09/22 0847  BP: (!) 122/92  Pulse: 73  Resp: 18  Temp: (!) 97.3 F (36.3 C)  SpO2: 100%     Body mass index is 27.77 kg/m.    ECOG FS:0 - Asymptomatic  General: Well-developed, well-nourished, no acute distress. Eyes: Pink conjunctiva, anicteric sclera. HEENT: Normocephalic, moist mucous membranes. Lungs: No audible wheezing or coughing. Heart: Regular rate and rhythm. Abdomen: Soft, nontender, no obvious distention. Musculoskeletal: No edema, cyanosis, or clubbing. Neuro: Alert, answering all questions appropriately. Cranial nerves grossly intact. Skin: No rashes or petechiae noted. Psych: Normal affect.  LAB RESULTS:  Lab Results  Component Value Date   NA 137  02/09/2022   K 4.2 02/09/2022   CL 105 02/09/2022   CO2 26 02/09/2022   GLUCOSE 95 02/09/2022   BUN 20 02/09/2022   CREATININE 0.64 02/09/2022   CALCIUM 8.8 (L) 02/09/2022   PROT 6.3 (L) 02/09/2022   ALBUMIN 3.8 02/09/2022   AST 20 02/09/2022   ALT 20 02/09/2022   ALKPHOS 71 02/09/2022   BILITOT 0.6 02/09/2022   GFRNONAA >60 02/09/2022   GFRAA >60 07/10/2013    Lab Results  Component Value Date   WBC 7.8 02/09/2022   NEUTROABS 5.2 02/09/2022   HGB 14.6 02/09/2022   HCT 43.5 02/09/2022   MCV 89.7 02/09/2022   PLT 231 02/09/2022     STUDIES: No results found.  ONCOLOGY HISTORY: Retroperitoneal lymph node biopsy on April 21, 2020  confirmed the diagnosis. PET scan on April 14, 2020 revealed a hypermetabolic right sided retroperitoneal nodal mass along with hypermetabolic mesenteric adenopathy.  Patient also noted to have bilateral axillary adenopathy.  Although patient was asymptomatic and active surveillance was offered, she elected to pursue treatment and received IV Rituxan weekly x4 completing on May 28, 2020.  Repeat PET scan from August 24, 2020 reviewed independently with significant treatment response, although likely residual disease.  Patient previously declined maintenance Rituxan.   ASSESSMENT: Recurrent stage III follicular lymphoma, grade 1/2.    PLAN:    1.  Recurrent stage III follicular lymphoma, grade 1/2:  Repeat imaging on March 15, 2021 reviewed independently with progression of disease.  Patient subsequently reinitiated treatment with weekly Rituxan x4 followed by maintenance Rituxan every 8 weeks for up to 2 years.  Can also consider Zevalin in the future if necessary.  Repeat imaging on December 13, 2021 reviewed independently with no obvious evidence of recurrent or progressive disease.  Proceed with cycle 4 of maintenance Rituxan today.  Return to clinic in 8 weeks for further evaluation and consideration of cycle 5.  Repeat imaging in May 2024.    2.   Hypermetabolic thyroid nodule: Previously, biopsy was negative for malignancy. 3.  Elevated liver enzymes: Resolved. 4.  Polycythemia: Resolved. 5.  Myalgias: Patient does not complain of this today.  Continue ibuprofen as needed. 6.  Hyperkalemia: Resolved. 7.  Depression: Significantly improved.  Continue current medications as prescribed.  I spent a total of 30 minutes reviewing chart data, face-to-face evaluation with the patient, counseling and coordination of care as detailed above.   Patient expressed understanding and was in agreement with this plan. She also understands that She can call clinic at any time with any questions, concerns, or complaints.    Cancer Staging  Follicular lymphoma ([place]) Staging form: Hodgkin and Non-Hodgkin Lymphoma, AJCC 8th Edition - Clinical stage from 9/35/7017: Stage III (Follicular lymphoma) - Signed by [name], MD on 04/29/2020 Stage prefix: Initial diagnosis   [name], MD   02/09/2022 9:05 AM

## 2022-03-07 ENCOUNTER — Ambulatory Visit (INDEPENDENT_AMBULATORY_CARE_PROVIDER_SITE_OTHER): Payer: BC Managed Care – PPO | Admitting: Family Medicine

## 2022-03-07 ENCOUNTER — Encounter: Payer: Self-pay | Admitting: Family Medicine

## 2022-03-07 VITALS — BP 120/78 | HR 53 | Temp 97.3°F | Ht 63.0 in | Wt 167.5 lb

## 2022-03-07 DIAGNOSIS — E611 Iron deficiency: Secondary | ICD-10-CM

## 2022-03-07 DIAGNOSIS — M810 Age-related osteoporosis without current pathological fracture: Secondary | ICD-10-CM

## 2022-03-07 DIAGNOSIS — Z9884 Bariatric surgery status: Secondary | ICD-10-CM

## 2022-03-07 DIAGNOSIS — Z0001 Encounter for general adult medical examination with abnormal findings: Secondary | ICD-10-CM

## 2022-03-07 DIAGNOSIS — E663 Overweight: Secondary | ICD-10-CM

## 2022-03-07 DIAGNOSIS — F4321 Adjustment disorder with depressed mood: Secondary | ICD-10-CM

## 2022-03-07 DIAGNOSIS — E042 Nontoxic multinodular goiter: Secondary | ICD-10-CM

## 2022-03-07 DIAGNOSIS — C8200 Follicular lymphoma grade I, unspecified site: Secondary | ICD-10-CM

## 2022-03-07 LAB — VITAMIN D 25 HYDROXY (VIT D DEFICIENCY, FRACTURES): VITD: 35.45 ng/mL (ref 30.00–100.00)

## 2022-03-07 LAB — IBC PANEL
Iron: 69 ug/dL (ref 42–145)
Saturation Ratios: 21 % (ref 20.0–50.0)
TIBC: 329 ug/dL (ref 250.0–450.0)
Transferrin: 235 mg/dL (ref 212.0–360.0)

## 2022-03-07 LAB — LIPID PANEL
Cholesterol: 169 mg/dL (ref 0–200)
HDL: 61.9 mg/dL (ref >39.00)
LDL Cholesterol: 89 mg/dL (ref 0–99)
NonHDL: 107.4
Total CHOL/HDL Ratio: 3
Triglycerides: 93 mg/dL (ref 0.0–149.0)
VLDL: 18.6 mg/dL (ref 0.0–40.0)

## 2022-03-07 LAB — HEMOGLOBIN A1C: Hgb A1c MFr Bld: 5.3 % (ref 4.6–6.5)

## 2022-03-07 LAB — TSH: TSH: 2.57 u[IU]/mL (ref 0.35–5.50)

## 2022-03-07 LAB — VITAMIN B12: Vitamin B-12: 556 pg/mL (ref 211–911)

## 2022-03-07 LAB — FOLATE: Folate: 10.2 ng/mL (ref >5.9)

## 2022-03-07 LAB — FERRITIN: Ferritin: 35.5 ng/mL (ref 10.0–291.0)

## 2022-03-07 MED ORDER — BUPROPION HCL ER (SR) 100 MG PO TB12: 100.0000 mg | ORAL_TABLET | Freq: Every morning | ORAL | 4 refills | Status: DC

## 2022-03-07 NOTE — Assessment & Plan Note (Signed)
Update labs.  

## 2022-03-07 NOTE — Assessment & Plan Note (Signed)
Appreciate onc care - continue lymphoma maintenance treatment with Rituxan q8 wks.  She recently was approved for disability.

## 2022-03-07 NOTE — Patient Instructions (Addendum)
Laboratorios hoy La remitiremos para otro sonograma de tiroides.  Gusto verla hoy Siga wellbutrin SR '100mg'$  diarios.  Llame para cita ginecologa (Dr Kennon Rounds)  Shiela Mayer llame a hacer cita para mamograma, haga cita tambien para densitometria.  Preguntele a Dr Maryjane Hurter si recomienda vacuna contra RSV.  Regresar en 1 ao para proximo examen fisico.

## 2022-03-07 NOTE — Progress Notes (Signed)
Patient ID: Melinda Hall, female    DOB: Jul 18, 1960, 62 y.o.   MRN: [id number]  This visit was conducted in person.  BP 120/78   Pulse (!) 53   Temp (!) 97.3 F (36.3 C) (Temporal)   Ht '5\' 3"'$  (1.6 m)   Wt 167 lb 8 oz (76 kg)   SpO2 99%   BMI 29.67 kg/m    CC: CPE Subjective:   HPI: Melinda Hall is a 62 y.o. female presenting on 03/07/2022 for Annual Exam   Recent 2+ wk trip to [place] with her daughter.   S/p roux en y gastric bypass 2004.  S/p cervical neck surgery ([facility]) 2018.  Thyroid biopsy x2 returned benign (follicular thyroid nodules).   Diagnosed 2022 with stage 3 follicular lymphoma s/p chemo course (IV Rituxan x4 wks 05/2020) - sees Dr Grayland Ormond. Due to recurrence of constitutional symptoms 01/2021, restarted maintenance cycles of Rituxan, last seen 02/09/2022 with stable eval. Notes intermittent night sweatsand fatigue.   Wellbutrin SR '100mg'$  daily started 05/2021. She has also spoken with counselor through cancer center.   She has applied for disability which has been approved.   Chronic left lateral foot numbness from h/o lumbar radiculopathy.  Notes morning stiffness or with prolonged sitting.    Preventative: Colonoscopy 11/2015 - TA rpt 5 yrs ([facility]).  COLONOSCOPY WITH PROPOFOL 05/17/2021 - TAs, SSPs, rpt 3 yrs Allen Norris, Darren, MD) Well woman with OBGYN (Dr Kennon Rounds) 05/2017. S/p bilateral oophorectomy for dermoid cysts age 59 yo. Never used HRT.  Mammo 05/2021 - Birads2 @ [facility]  DEXA 11/2015: T -2.6 spine osteoporosis, saw endo Dr Graceann Congress, rec against treatment at this time.  DEXA 02/2019 T -2.7 at spine - started on prolia, last injection 10/19/2021.  Lung cancer screening - not eligible  Flu shot yearly [place] 04/2019, 09/2019, no booster Tdap 2018  RSV - to discuss with onc Shingrix - discussed. Declines as she has never had chicken pox.  Seat belt use discussed  Sunscreen use discussed. 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 Interim medical history since our last visit reviewed. Allergies and medications reviewed and updated. Outpatient Medications Prior to Visit  Medication Sig Dispense Refill   calcium carbonate (OS-CAL) 600 MG TABS tablet Take 600 mg by mouth daily with breakfast.     Cholecalciferol (VITAMIN D3) 1000 units CAPS Take 3 capsules (3,000 Units total) by mouth daily.     denosumab (PROLIA) 60 MG/ML SOSY injection PRESCRIBER TO INJECT '60MG'$  SUBCUTANEOUSLY EVERY 6 MONTHS 1 mL 0   ferrous sulfate 325 (65 FE) MG tablet Take 1 tablet (325 mg total) by mouth every Monday, Wednesday, and Friday.     Magnesium 250 MG TABS Take 1 tablet (250 mg total) by mouth daily.  0   vitamin B-12 (CYANOCOBALAMIN) 1000 MCG tablet Take 1 tablet (1,000 mcg total) by mouth daily.     zinc gluconate 50 MG tablet Take 1 tablet (50 mg total) by mouth daily.     buPROPion ER (WELLBUTRIN SR) 100 MG 12 hr tablet TAKE 1 TABLET (100 MG TOTAL) BY MOUTH IN  THE MORNING 90 tablet 2   No facility-administered medications prior to visit.     Per HPI unless specifically indicated in ROS section below Review of Systems  Constitutional:  Negative for activity change, appetite change, chills, fatigue, fever and unexpected weight change.  HENT:  Negative for hearing loss.   Eyes:  Negative for visual disturbance.  Respiratory:  Negative for cough, chest tightness, shortness of breath and wheezing.   Cardiovascular:  Positive for leg swelling. Negative for chest pain and palpitations.  Gastrointestinal:  Negative for abdominal distention, abdominal pain, blood in stool,  constipation, diarrhea, nausea and vomiting.  Genitourinary:  Negative for difficulty urinating and hematuria.  Musculoskeletal:  Negative for arthralgias, myalgias and neck pain.  Skin:  Negative for rash.  Neurological:  Negative for dizziness, seizures, syncope and headaches.  Hematological:  Negative for adenopathy. Does not bruise/bleed easily.  Psychiatric/Behavioral:  Negative for dysphoric mood. The patient is not nervous/anxious.     Objective:  BP 120/78   Pulse (!) 53   Temp (!) 97.3 F (36.3 C) (Temporal)   Ht '5\' 3"'$  (1.6 m)   Wt 167 lb 8 oz (76 kg)   SpO2 99%   BMI 29.67 kg/m   Wt Readings from Last 3 Encounters:  03/07/22 167 lb 8 oz (76 kg)  02/09/22 166 lb 14.4 oz (75.7 kg)  12/15/21 164 lb 12.8 oz (74.8 kg)      Physical Exam Vitals and nursing note reviewed.  Constitutional:      Appearance: Normal appearance. She is not ill-appearing.  HENT:     Head: Normocephalic and atraumatic.     Right Ear: Tympanic membrane, ear canal and external ear normal. There is no impacted cerumen.     Left Ear: Tympanic membrane, ear canal and external ear normal. There is no impacted cerumen.     Mouth/Throat:     Comments: Wearing mask Eyes:     General:        Right eye: No discharge.        Left eye: No discharge.     Extraocular Movements: Extraocular movements intact.     Conjunctiva/sclera: Conjunctivae normal.     Pupils: Pupils are equal, round, and reactive to light.  Neck:     Thyroid: No thyroid mass or thyromegaly.  Cardiovascular:     Rate and Rhythm: Normal rate and regular rhythm.     Pulses: Normal pulses.     Heart sounds: Normal heart sounds. No murmur heard. Pulmonary:     Effort: Pulmonary effort is normal. No respiratory distress.     Breath sounds: Normal breath sounds. No wheezing, rhonchi or rales.  Abdominal:     General: Bowel sounds are normal. There is no distension.     Palpations: Abdomen is soft. There is no mass.     Tenderness:  There is no abdominal tenderness. There is no guarding or rebound.     Hernia: No hernia is present.  Musculoskeletal:     Cervical back: Normal range of motion and neck supple. No rigidity.     Right lower leg: No edema.     Left lower leg: No edema.  Lymphadenopathy:     Cervical: No cervical adenopathy.  Skin:    General: Skin is warm and dry.     Findings: No rash.  Neurological:     General: No focal deficit present.     Mental Status: She is alert. Mental status is at baseline.  Psychiatric:  Mood and Affect: Mood normal.        Behavior: Behavior normal.       Results for orders placed or performed in visit on 02/09/22  CBC with Differential  Result Value Ref Range   WBC 7.8 4.0 - 10.5 K/uL   RBC 4.85 3.87 - 5.11 MIL/uL   Hemoglobin 14.6 12.0 - 15.0 g/dL   HCT 43.5 36.0 - 46.0 %   MCV 89.7 80.0 - 100.0 fL   MCH 30.1 26.0 - 34.0 pg   MCHC 33.6 30.0 - 36.0 g/dL   RDW 13.8 11.5 - 15.5 %   Platelets 231 150 - 400 K/uL   nRBC 0.0 0.0 - 0.2 %   Neutrophils Relative % 67 %   Neutro Abs 5.2 1.7 - 7.7 K/uL   Lymphocytes Relative 19 %   Lymphs Abs 1.5 0.7 - 4.0 K/uL   Monocytes Relative 11 %   Monocytes Absolute 0.9 0.1 - 1.0 K/uL   Eosinophils Relative 2 %   Eosinophils Absolute 0.2 0.0 - 0.5 K/uL   Basophils Relative 1 %   Basophils Absolute 0.0 0.0 - 0.1 K/uL   Immature Granulocytes 0 %   Abs Immature Granulocytes 0.03 0.00 - 0.07 K/uL  Comprehensive metabolic panel  Result Value Ref Range   Sodium 137 135 - 145 mmol/L   Potassium 4.2 3.5 - 5.1 mmol/L   Chloride 105 98 - 111 mmol/L   CO2 26 22 - 32 mmol/L   Glucose, Bld 95 70 - 99 mg/dL   BUN 20 8 - 23 mg/dL   Creatinine, Ser 0.64 0.44 - 1.00 mg/dL   Calcium 8.8 (L) 8.9 - 10.3 mg/dL   Total Protein 6.3 (L) 6.5 - 8.1 g/dL   Albumin 3.8 3.5 - 5.0 g/dL   AST 20 15 - 41 U/L   ALT 20 0 - 44 U/L   Alkaline Phosphatase 71 38 - 126 U/L   Total Bilirubin 0.6 0.3 - 1.2 mg/dL   GFR, Estimated >60 >60 mL/min    Anion gap 6 5 - 15      03/07/2022   12:36 PM 06/21/2021   12:42 PM 04/05/2021    3:52 PM 02/24/2021    3:33 PM 02/25/2020    3:15 PM  Depression screen PHQ 2/9  Decreased Interest 0 2 1 0 0  Down, Depressed, Hopeless '1 1 1 '$ 0 0  PHQ - 2 Score '1 3 2 '$ 0 0  Altered sleeping '1 1 1    '$ Tired, decreased energy '1 2 2    '$ Change in appetite 1 3 0    Feeling bad or failure about yourself  0 1 0    Trouble concentrating 0 1 1    Moving slowly or fidgety/restless 0 1 0    Suicidal thoughts 0 0 0    PHQ-9 Score '4 12 6    '$ Difficult doing work/chores Not difficult at all Very difficult Somewhat difficult         03/07/2022   12:37 PM 06/21/2021   12:43 PM 12/05/2018    4:11 PM  GAD 7 : Generalized Anxiety Score  Nervous, Anxious, on Edge 1 1 0  Control/stop worrying 1 2 0  Worry too much - different things '1 2 1  '$ Trouble relaxing 0 1 0  Restless 0 1 0  Easily annoyed or irritable 0 0 0  Afraid - awful might happen 1 0 1  Total GAD 7 Score '4 7 2  '$ Anxiety Difficulty  Not difficult at all     Assessment & Plan:   Problem List Items Addressed This Visit     Status post gastric bypass for obesity (Chronic)    Update labs.       Relevant Orders   Lipid panel   VITAMIN D 25 Hydroxy (Vit-D Deficiency, Fractures)   Vitamin B1   TSH   Hemoglobin A1c   Vitamin B12   Ferritin   IBC panel   Folate   Encounter for general adult medical examination with abnormal findings - Primary (Chronic)    Preventative protocols reviewed and updated unless pt declined. Discussed healthy diet and lifestyle.  Rec call GYN for appointment as due.      Iron deficiency    Update iron levels on MWF replacement.      Overweight (BMI 25.0-29.9)   Osteoporosis    Chronic, stable period on prolia q20mo Next injection will be due next month.  Will order DEXA to be completed when she has mammogram 05/2022.      Relevant Orders   DG Bone Density   Follicular lymphoma ([place]    Appreciate onc care - continue  lymphoma maintenance treatment with Rituxan q8 wks.  She recently was approved for disability.       Multiple thyroid nodules    She had benign thyroid biopsy 05/2020.  Will order updated thyroid ultrasound.      Relevant Orders   [place]   Situational depression    Overall stable period on wellbutrin SR '100mg'$  daily - with improvement based on PHQ9 score. She also has seen counselor which has helped.      Relevant Medications   buPROPion ER (WELLBUTRIN SR) 100 MG 12 hr tablet     Meds ordered this encounter  Medications   buPROPion ER (WELLBUTRIN SR) 100 MG 12 hr tablet    Sig: Take 1 tablet (100 mg total) by mouth in the morning.    Dispense:  90 tablet    Refill:  4    Orders Placed This Encounter  Procedures   DG Bone Density    Standing Status:   Future    Standing Expiration Date:   03/08/2023    Order Specific Question:   Reason for Exam (SYMPTOM  OR DIAGNOSIS REQUIRED)    Answer:   osteoporosis    Order Specific Question:   Preferred imaging location?    Answer:   [facility]   [place]    Standing Status:   Future    Standing Expiration Date:   03/08/2023    Order Specific Question:   Reason for Exam (SYMPTOM  OR DIAGNOSIS REQUIRED)    Answer:   follow up known thyroid nodules    Order Specific Question:   Preferred imaging location?    Answer:   [facility]   Lipid panel   VITAMIN D 25 Hydroxy (Vit-D Deficiency, Fractures)   Vitamin B1   TSH   Hemoglobin A1c   Vitamin B12   Ferritin   IBC panel   Folate    Patient Instructions  Laboratorios hoy La remitiremos para otro sonograma de tiroides.  Gusto verla hoy Siga wellbutrin SR '100mg'$  diarios.  Llame para cita ginecologa (Dr PKennon Rounds  CShiela Mayerllame a hacer cita para mamograma, haga cita tambien para densitometria.  Preguntele a Dr FMaryjane Hurtersi recomienda vacuna contra RSV.  Regresar en 1 ao para proximo examen fisico.   Follow up plan: Return in about 1 year (around 03/08/2023), or  if  symptoms worsen or fail to improve, for annual exam, prior fasting for blood work.  [name], MD

## 2022-03-07 NOTE — Assessment & Plan Note (Addendum)
Preventative protocols reviewed and updated unless pt declined. Discussed healthy diet and lifestyle.  Rec call GYN for appointment as due.

## 2022-03-07 NOTE — Assessment & Plan Note (Signed)
Chronic, stable period on prolia q96mo Next injection will be due next month.  Will order DEXA to be completed when she has mammogram 05/2022.

## 2022-03-07 NOTE — Assessment & Plan Note (Signed)
Update iron levels on MWF replacement.  

## 2022-03-07 NOTE — Assessment & Plan Note (Signed)
She had benign thyroid biopsy 05/2020.  Will order updated thyroid ultrasound.

## 2022-03-07 NOTE — Assessment & Plan Note (Signed)
Overall stable period on wellbutrin SR '100mg'$  daily - with improvement based on PHQ9 score. She also has seen counselor which has helped.

## 2022-03-11 ENCOUNTER — Ambulatory Visit
Admission: RE | Admit: 2022-03-11 | Discharge: 2022-03-11 | Disposition: A | Discharge status: Home / Self Care | Payer: BC Managed Care – PPO | Source: Ambulatory Visit | Attending: Family Medicine | Admitting: Family Medicine

## 2022-03-11 DIAGNOSIS — E042 Nontoxic multinodular goiter: Secondary | ICD-10-CM | POA: Diagnosis not present

## 2022-03-11 LAB — VITAMIN B1: Vitamin B1 (Thiamine): 10 nmol/L (ref 8–30)

## 2022-04-04 ENCOUNTER — Telehealth: Payer: Self-pay

## 2022-04-04 DIAGNOSIS — M810 Age-related osteoporosis without current pathological fracture: Secondary | ICD-10-CM

## 2022-04-04 NOTE — Telephone Encounter (Signed)
Prolia VOB initiated via [email or web address] 

## 2022-04-04 NOTE — Telephone Encounter (Signed)
Please start new PA for Prolia for patient.  She is due for next inj on or after 04/20/2022.  Thanks, Anda Kraft

## 2022-04-05 ENCOUNTER — Other Ambulatory Visit: Payer: Self-pay | Admitting: *Deleted

## 2022-04-05 DIAGNOSIS — C8200 Follicular lymphoma grade I, unspecified site: Secondary | ICD-10-CM

## 2022-04-06 ENCOUNTER — Inpatient Hospital Stay: Payer: BC Managed Care – PPO

## 2022-04-06 ENCOUNTER — Inpatient Hospital Stay: Payer: BC Managed Care – PPO | Attending: Oncology | Admitting: Oncology

## 2022-04-06 ENCOUNTER — Encounter: Payer: Self-pay | Admitting: Oncology

## 2022-04-06 VITALS — BP 131/83 | HR 59 | Temp 97.8°F | Resp 18

## 2022-04-06 DIAGNOSIS — C8218 Follicular lymphoma grade II, lymph nodes of multiple sites: Secondary | ICD-10-CM

## 2022-04-06 DIAGNOSIS — Z5112 Encounter for antineoplastic immunotherapy: Secondary | ICD-10-CM | POA: Insufficient documentation

## 2022-04-06 DIAGNOSIS — C8200 Follicular lymphoma grade I, unspecified site: Secondary | ICD-10-CM

## 2022-04-06 DIAGNOSIS — Z87891 Personal history of nicotine dependence: Secondary | ICD-10-CM | POA: Insufficient documentation

## 2022-04-06 DIAGNOSIS — Z79620 Long term (current) use of immunosuppressive biologic: Secondary | ICD-10-CM | POA: Insufficient documentation

## 2022-04-06 LAB — CBC WITH DIFFERENTIAL/PLATELET
Abs Immature Granulocytes: 0.02 10*3/uL (ref 0.00–0.07)
Basophils Absolute: 0 10*3/uL (ref 0.0–0.1)
Basophils Relative: 0 %
Eosinophils Absolute: 0.1 10*3/uL (ref 0.0–0.5)
Eosinophils Relative: 1 %
HCT: 44.1 % (ref 36.0–46.0)
Hemoglobin: 14.5 g/dL (ref 12.0–15.0)
Immature Granulocytes: 0 %
Lymphocytes Relative: 20 %
Lymphs Abs: 1.8 10*3/uL (ref 0.7–4.0)
MCH: 29.8 pg (ref 26.0–34.0)
MCHC: 32.9 g/dL (ref 30.0–36.0)
MCV: 90.7 fL (ref 80.0–100.0)
Monocytes Absolute: 1 10*3/uL (ref 0.1–1.0)
Monocytes Relative: 11 %
Neutro Abs: 6 10*3/uL (ref 1.7–7.7)
Neutrophils Relative %: 68 %
Platelets: 227 10*3/uL (ref 150–400)
RBC: 4.86 MIL/uL (ref 3.87–5.11)
RDW: 13.9 % (ref 11.5–15.5)
WBC: 9 10*3/uL (ref 4.0–10.5)
nRBC: 0 % (ref 0.0–0.2)

## 2022-04-06 LAB — CMP (CANCER CENTER ONLY)
ALT: 17 U/L (ref 0–44)
AST: 16 U/L (ref 15–41)
Albumin: 4.1 g/dL (ref 3.5–5.0)
Alkaline Phosphatase: 75 U/L (ref 38–126)
Anion gap: 8 (ref 5–15)
BUN: 15 mg/dL (ref 8–23)
CO2: 27 mmol/L (ref 22–32)
Calcium: 9.4 mg/dL (ref 8.9–10.3)
Chloride: 103 mmol/L (ref 98–111)
Creatinine: 0.55 mg/dL (ref 0.44–1.00)
GFR, Estimated: >60 mL/min (ref >60)
Glucose, Bld: 92 mg/dL (ref 70–99)
Potassium: 4.4 mmol/L (ref 3.5–5.1)
Sodium: 138 mmol/L (ref 135–145)
Total Bilirubin: 0.7 mg/dL (ref 0.3–1.2)
Total Protein: 7.1 g/dL (ref 6.5–8.1)

## 2022-04-06 MED ORDER — SODIUM CHLORIDE 0.9 % IV SOLN
375.0000 mg/m2 | Freq: Once | INTRAVENOUS | Status: AC
  Administered 2022-04-06: 700 mg via INTRAVENOUS
  Filled 2022-04-06: qty 50

## 2022-04-06 MED ORDER — ACETAMINOPHEN 325 MG PO TABS
650.0000 mg | ORAL_TABLET | Freq: Once | ORAL | Status: AC
  Administered 2022-04-06: 650 mg via ORAL
  Filled 2022-04-06: qty 2

## 2022-04-06 MED ORDER — SODIUM CHLORIDE 0.9 % IV SOLN: 375.0000 mg/m2 | Freq: Once | INTRAVENOUS | Status: DC

## 2022-04-06 MED ORDER — SODIUM CHLORIDE 0.9 % IV SOLN
Freq: Once | INTRAVENOUS | Status: AC
  Filled 2022-04-06: qty 250

## 2022-04-06 MED ORDER — DIPHENHYDRAMINE HCL 25 MG PO CAPS
50.0000 mg | ORAL_CAPSULE | Freq: Once | ORAL | Status: AC
  Administered 2022-04-06: 50 mg via ORAL
  Filled 2022-04-06: qty 2

## 2022-04-06 NOTE — Progress Notes (Signed)
Patient denies any concerns today.  

## 2022-04-06 NOTE — Progress Notes (Signed)
[facility]  Telephone:(336) [phone] [phone]) [phone]  ID: Melinda Hall OB: [birth year]  MR#: [id number]  [id number]  Patient Care Team: [name], MD as PCP - General (Family Medicine) [name], MD as Consulting Physician (Oncology) [name], MD as Consulting Physician (Neurosurgery)  CHIEF COMPLAINT: Recurrent stage III follicular lymphoma, grade 1/2.    INTERVAL HISTORY: Patient returns to clinic today for further evaluation and consideration of cycle 5 of maintenance Rituxan.  She continues to tolerate her treatments well without significant side effects.  She currently feels well and is asymptomatic.  She recently had several episodes of night sweats, but these seem to have resolved.  She has no neurologic complaints.  She does not complain of any weakness or fatigue today. She denies any fevers or weight loss.  She does not complain of any abdominal discomfort today. She has no chest pain, shortness of breath, cough, or hemoptysis.  She denies any nausea, vomiting, constipation, or diarrhea.  She has no urinary complaints.  Patient offers no further specific complaints today.  REVIEW OF SYSTEMS:   Review of Systems  Constitutional: Negative.  Negative for diaphoresis, fever, malaise/fatigue and weight loss.  Respiratory: Negative.  Negative for cough, hemoptysis and shortness of breath.   Cardiovascular: Negative.  Negative for chest pain and leg swelling.  Gastrointestinal: Negative.  Negative for abdominal pain.  Genitourinary: Negative.  Negative for dysuria.  Musculoskeletal: Negative.  Negative for back pain and myalgias.  Skin: Negative.  Negative for rash.  Neurological: Negative.  Negative for tingling, sensory change, focal weakness, weakness and headaches.  Psychiatric/Behavioral: Negative.  Negative for depression. The patient is not nervous/anxious.     As per HPI. Otherwise, a complete review of systems is  negative.  PAST MEDICAL HISTORY: Past Medical History:  Diagnosis Date   Cancer ([place])    Dermoid tumor    hx of--left ovary   Follicular lymphoma ([place])    Gastric bypass status for obesity 2004   s/p roux-en-y   Generalized headaches    frequent   Heart murmur    History of cardiac murmur    Intraductal papilloma of breast, left 06/18/2017   05/2017 - s/p excision 07/2017   Osteoporosis 03/2016   DEXA -2.6 spine   Salivary gland stone    x 2   Smoker     PAST SURGICAL HISTORY: Past Surgical History:  Procedure Laterality Date   ABDOMINAL HYSTERECTOMY     only ovary was removed   ANTERIOR CERVICAL DECOMP/DISCECTOMY FUSION  12/2016   [place]  ~1992   for dermoid ovarian cysts   BREAST BIOPSY Left 06/13/2017   papilloma with excision 07/2017   BREAST LUMPECTOMY WITH NEEDLE LOCALIZATION Left 08/10/2017   intraductal papilloma - Caroleen Hamman F, MD   CHOLECYSTECTOMY  2000s   COLONOSCOPY WITH PROPOFOL N/A 11/27/2015   TA rpt 5 yrs Lucilla Lame, MD)   COLONOSCOPY WITH PROPOFOL N/A 05/17/2021   TAs, SSPs, rpt 3 yrs Allen Norris, Darren, MD)   GASTRIC BYPASS  2004   roux en y [facility])   POLYPECTOMY  11/27/2015   Procedure: POLYPECTOMY;  Surgeon: Lucilla Lame, MD;  Location: [facility];  Service: Endoscopy;;   POLYPECTOMY  05/17/2021   Procedure: POLYPECTOMY;  Surgeon: Lucilla Lame, MD;  Location: [facility];  Service: Endoscopy;;   REDUCTION MAMMAPLASTY Bilateral 1990's   SALIVARY GLAND SURGERY     SALIVARY STONE REMOVAL  FAMILY HISTORY: Family History  Problem Relation Age of Onset   Cancer Father 67       leukemia   Hypertension Mother    Diabetes Neg Hx    Stroke Neg Hx    Coronary artery disease Neg Hx    Breast cancer Neg Hx     ADVANCED DIRECTIVES (Y/N):  N  HEALTH MAINTENANCE: Social History   Tobacco Use   Smoking status: Former    Packs/day: 0.25    Years: 20.00    Total pack years: 5.00     Types: Cigarettes    Quit date: 10/31/2012    Years since quitting: 9.4   Smokeless tobacco: Never  Vaping Use   Vaping Use: Never used  Substance Use Topics   Alcohol use: Not Currently   Drug use: No     Colonoscopy:  PAP:  Bone density:  Lipid panel:  Allergies  Allergen Reactions   Boniva [Ibandronic Acid] Other (See Comments)    Diffuse body aches    Current Outpatient Medications  Medication Sig Dispense Refill   buPROPion ER (WELLBUTRIN SR) 100 MG 12 hr tablet Take 1 tablet (100 mg total) by mouth in the morning. 90 tablet 4   calcium carbonate (OS-CAL) 600 MG TABS tablet Take 600 mg by mouth daily with breakfast.     Cholecalciferol (VITAMIN D3) 1000 units CAPS Take 3 capsules (3,000 Units total) by mouth daily.     denosumab (PROLIA) 60 MG/ML SOSY injection PRESCRIBER TO INJECT '60MG'$  SUBCUTANEOUSLY EVERY 6 MONTHS 1 mL 0   ferrous sulfate 325 (65 FE) MG tablet Take 1 tablet (325 mg total) by mouth every Monday, Wednesday, and Friday.     Magnesium 250 MG TABS Take 1 tablet (250 mg total) by mouth daily.  0   vitamin B-12 (CYANOCOBALAMIN) 1000 MCG tablet Take 1 tablet (1,000 mcg total) by mouth daily.     zinc gluconate 50 MG tablet Take 1 tablet (50 mg total) by mouth daily.     No current facility-administered medications for this visit.   Facility-Administered Medications Ordered in Other Visits  Medication Dose Route Frequency Provider Last Rate Last Admin   0.9 %  sodium chloride infusion   Intravenous Once [name], MD       acetaminophen (TYLENOL) tablet 650 mg  650 mg Oral Once [name], MD       diphenhydrAMINE (BENADRYL) capsule 50 mg  50 mg Oral Once [name], MD       riTUXimab-pvvr (RUXIENCE) 700 mg in sodium chloride 0.9 % 180 mL infusion  375 mg/m2 (Treatment Plan Recorded) Intravenous Once [name], MD        OBJECTIVE: Vitals:   04/06/22 0856  Pulse: 65  Resp: 18  Temp: 97.6 F (36.4 C)  SpO2: 100%      Body mass index is 29.49 kg/m.    ECOG FS:0 - Asymptomatic  General: Well-developed, well-nourished, no acute distress. Eyes: Pink conjunctiva, anicteric sclera. HEENT: Normocephalic, moist mucous membranes. Lungs: No audible wheezing or coughing. Heart: Regular rate and rhythm. Abdomen: Soft, nontender, no obvious distention. Musculoskeletal: No edema, cyanosis, or clubbing. Neuro: Alert, answering all questions appropriately. Cranial nerves grossly intact. Skin: No rashes or petechiae noted. Psych: Normal affect.  LAB RESULTS:  Lab Results  Component Value Date   NA 138 04/06/2022   K 4.4 04/06/2022   CL 103 04/06/2022   CO2 27 04/06/2022   GLUCOSE 92 04/06/2022   BUN  15 04/06/2022   CREATININE 0.55 04/06/2022   CALCIUM 9.4 04/06/2022   PROT 7.1 04/06/2022   ALBUMIN 4.1 04/06/2022   AST 16 04/06/2022   ALT 17 04/06/2022   ALKPHOS 75 04/06/2022   BILITOT 0.7 04/06/2022   GFRNONAA >60 04/06/2022   GFRAA >60 07/10/2013    Lab Results  Component Value Date   WBC 9.0 04/06/2022   NEUTROABS 6.0 04/06/2022   HGB 14.5 04/06/2022   HCT 44.1 04/06/2022   MCV 90.7 04/06/2022   PLT 227 04/06/2022     STUDIES: US THYROID  Result Date: 03/12/2022 CLINICAL DATA:  Thyroid nodule follow-up EXAM: THYROID ULTRASOUND TECHNIQUE: Ultrasound examination of the thyroid gland and adjacent soft tissues was performed. COMPARISON:  05/13/2020 05/01/2020 FINDINGS: Parenchymal Echotexture: Mildly heterogenous Isthmus: 0.3 cm Right lobe: 3.6 x 1.2 x 1.4 cm Left lobe: 4.3 x 1.9 x 1.9 cm _________________________________________________________ Estimated total number of nodules >/= 1 cm: 2 Number of spongiform nodules >/=  2 cm not described below (TR1): 0 Number of mixed cystic and solid nodules >/= 1.5 cm not described below (TR2): 0 _________________________________________________________ Nodule 2: Previously seen calcified nodule in the right mid thyroid lobe has decreased in size now  measuring 0.7 x 0.7 x 0.6 cm compared to 1.3 x 1.0 x 0.8 cm on the prior exam from 05/01/2020. It does not meet criteria for FNA or imaging follow-up. Remaining subcentimeter right thyroid nodules do not meet criteria for FNA or imaging follow-up. Nodule 4: 1.6 x 1.6 x 1.6 cm solid left mid thyroid nodule is unchanged in size from prior examination. Please correlate with FNA results from 05/13/2020. Nodule 5: 1.6 x 1.6 x 1.4 cm left inferior thyroid nodule is also unchanged in size. Please correlate with prior FNA results from 05/13/2020. IMPRESSION: 1. Previously biopsied left mid and inferior thyroid nodules have not significantly changed in size. Please correlate with FNA results from 05/13/2020. 2. Subcentimeter right thyroid nodules do not meet criteria for FNA or imaging follow-up. The above is in keeping with the ACR TI-RADS recommendations - J Am Coll Radiol 2017;14:587-595. Electronically Signed   By: Miachel Roux M.D.   On: 03/12/2022 12:27    ONCOLOGY HISTORY: Retroperitoneal lymph node biopsy on April 21, 2020 confirmed the diagnosis. PET scan on April 14, 2020 revealed a hypermetabolic right sided retroperitoneal nodal mass along with hypermetabolic mesenteric adenopathy.  Patient also noted to have bilateral axillary adenopathy.  Although patient was asymptomatic and active surveillance was offered, she elected to pursue treatment and received IV Rituxan weekly x4 completing on May 28, 2020.  Repeat PET scan from August 24, 2020 reviewed independently with significant treatment response, although likely residual disease.  Patient previously declined maintenance Rituxan.   ASSESSMENT: Recurrent stage III follicular lymphoma, grade 1/2.    PLAN:    Recurrent stage III follicular lymphoma, grade 1/2:  Repeat imaging on March 15, 2021 reviewed independently with progression of disease.  Patient subsequently reinitiated treatment and received weekly Rituxan x 4 between March 31, 2021 and April 21, 2021.  She then initiated maintenance Rituxan every 8 weeks on Jun 23, 2021.  Will continue treatment every 8 weeks for up to 2 years. Can also consider Zevalin in the future if necessary.  Repeat imaging on December 13, 2021 reviewed independently with no obvious evidence of recurrent or progressive disease.  Proceed with cycle 5 of maintenance Rituxan today.  Return to clinic in 8 weeks for further evaluation and consideration of cycle 6.  Will repeat imaging with CT scan 2 to 3 days prior to next infusion. Hypermetabolic thyroid nodule: Previously, biopsy was negative for malignancy. Myalgias: Patient does not complain of this today.  Continue ibuprofen as needed. Depression: Significantly improved.  Continue current medications as prescribed.   Patient expressed understanding and was in agreement with this plan. She also understands that She can call clinic at any time with any questions, concerns, or complaints.    Cancer Staging  Follicular lymphoma ([place]) Staging form: Hodgkin and Non-Hodgkin Lymphoma, AJCC 8th Edition - Clinical stage from [id number]: Stage III (Follicular lymphoma) - Signed by [name], MD on 04/29/2020 Stage prefix: Initial diagnosis   [name], MD   04/06/2022 9:31 AM

## 2022-04-11 ENCOUNTER — Other Ambulatory Visit: Payer: Self-pay | Admitting: Family Medicine

## 2022-04-11 DIAGNOSIS — Z1231 Encounter for screening mammogram for malignant neoplasm of breast: Secondary | ICD-10-CM

## 2022-04-15 NOTE — Telephone Encounter (Signed)
Following up on prolia authorization

## 2022-04-27 ENCOUNTER — Telehealth: Payer: Self-pay | Admitting: Family Medicine

## 2022-04-27 ENCOUNTER — Other Ambulatory Visit (HOSPITAL_COMMUNITY): Payer: Self-pay

## 2022-04-27 NOTE — Telephone Encounter (Signed)
Pt stated she usually had prolia inj every 6 months & her last inj was 10/19/21. Pt stated she hasn't received any calls as to yet regarding her next location. Call back # [phone]

## 2022-04-27 NOTE — Telephone Encounter (Signed)
Pharmacy Patient Advocate Encounter   Received notification that prior authorization for Prolia is required/requested.    PA submitted on 04/27/22 to (ins) [employer] [place] via [employer]  # [id number] Status is pending

## 2022-05-03 NOTE — Telephone Encounter (Signed)
Message sent to work on [place]. I have sent message to patient to let her know were we are as well.

## 2022-05-03 NOTE — Telephone Encounter (Signed)
Following up on auth for Prolia

## 2022-05-04 ENCOUNTER — Ambulatory Visit (INDEPENDENT_AMBULATORY_CARE_PROVIDER_SITE_OTHER): Payer: BC Managed Care – PPO | Admitting: Family Medicine

## 2022-05-04 VITALS — BP 122/84 | HR 74 | Temp 97.2°F | Ht 63.0 in | Wt 163.2 lb

## 2022-05-04 DIAGNOSIS — S8991XA Unspecified injury of right lower leg, initial encounter: Secondary | ICD-10-CM | POA: Insufficient documentation

## 2022-05-04 NOTE — Progress Notes (Signed)
Patient ID: Melinda Hall, female    DOB: 1960/09/21, 62 y.o.   MRN: [id number]  This visit was conducted in person.  BP 122/84   Pulse 74   Temp (!) 97.2 F (36.2 C) (Temporal)   Ht 5\' 3"  (1.6 m)   Wt 163 lb 4 oz (74 kg)   SpO2 96%   BMI 28.92 kg/m    CC: R knee injury  Subjective:   HPI: Melinda Hall is a 62 y.o. female presenting on 05/04/2022 for Knee Injury (Injured R knee while working in garden on 04/28/22. Tried ice therapy and keeping elevated. )   DOI: 04/28/2022 Injured right knee while working in the garden last week - while pulling heavy piece of wood. Severe pain to R medial knee at that time. Needed to take left over opiate.  So far has tried ice to knee and leg elevation (2 pillows at night).  Pain has improved but still uncomfortable. Notes mild swelling to tender area.  Knee feels unstable when walking. No locking in place.   No prior knee injury.   Notes ongoing chronic numbness to left lateral foot, more recently feeling tightness to left heel into achilles tendon.      Relevant past medical, surgical, family and social history reviewed and updated as indicated. Interim medical history since our last visit reviewed. Allergies and medications reviewed and updated. Outpatient Medications Prior to Visit  Medication Sig Dispense Refill   buPROPion ER (WELLBUTRIN SR) 100 MG 12 hr tablet Take 1 tablet (100 mg total) by mouth in the morning. 90 tablet 4   calcium carbonate (OS-CAL) 600 MG TABS tablet Take 600 mg by mouth daily with breakfast.     Cholecalciferol (VITAMIN D3) 1000 units CAPS Take 3 capsules (3,000 Units total) by mouth daily.     denosumab (PROLIA) 60 MG/ML SOSY injection PRESCRIBER TO INJECT 60MG  SUBCUTANEOUSLY EVERY 6 MONTHS 1 mL 0   ferrous sulfate 325 (65 FE) MG tablet Take 1 tablet (325 mg total) by mouth every Monday, Wednesday, and Friday.     Magnesium 250 MG TABS Take 1 tablet (250 mg total) by mouth daily.  0   vitamin B-12  (CYANOCOBALAMIN) 1000 MCG tablet Take 1 tablet (1,000 mcg total) by mouth daily.     zinc gluconate 50 MG tablet Take 1 tablet (50 mg total) by mouth daily.     No facility-administered medications prior to visit.     Per HPI unless specifically indicated in ROS section below Review of Systems  Objective:  BP 122/84   Pulse 74   Temp (!) 97.2 F (36.2 C) (Temporal)   Ht 5\' 3"  (1.6 m)   Wt 163 lb 4 oz (74 kg)   SpO2 96%   BMI 28.92 kg/m   Wt Readings from Last 3 Encounters:  05/04/22 163 lb 4 oz (74 kg)  04/06/22 166 lb 8 oz (75.5 kg)  03/07/22 167 lb 8 oz (76 kg)      Physical Exam Vitals and nursing note reviewed.  Constitutional:      Appearance: Normal appearance. She is not ill-appearing.  Musculoskeletal:        General: Swelling and tenderness present.     Comments:  L knee WNL R knee exam: No deformity on inspection. ++ pain with palpation of medial knee. Marked swelling with warmth and tenderness to medial knee, no erythema Limited ROM in flex and extension due to pain, no crepitus. Mild popliteal fullness. Neg drawer test.  Neg mcmurray test. ++ pain with valgus stress. No PFgrind. No abnormal patellar mobility.   Skin:    General: Skin is warm and dry.     Findings: No rash.  Neurological:     Mental Status: She is alert.       Assessment & Plan:   Problem List Items Addressed This Visit     Right knee injury, initial encounter - Primary    Anticipate MCL sprain based on exam and mechanism of injury.  Supportive measures reviewed as per instructions - rest, ice, elevation, compression and NSAID use reviewed. Discussed brace use.  MCL sprain handout provided.  Advised schedule sports med eval in 1-2 wks if not improving as expected. Pt agrees with plan.         No orders of the defined types were placed in this encounter.   No orders of the defined types were placed in this encounter.   Patient Instructions  Creo que tiene esguince de  ligamento lateral interno de la rodilla derecha.  Siga elevando pierna, hielo como [employer], use tylenol para dolor como necesite y ibuprofeno 400-600mg  con comidas por 3-5 dias.  Use faja de rodilla.  Le voy a dar ejercicios para hacer tambien  Dejeme saber si no mejora con esto. Si no va mejorando en 1-2 semanas, haga cita con Dr Lorelei Pont para re-evaluacion.   Follow up plan: No follow-ups on file.  [name], MD

## 2022-05-04 NOTE — Patient Instructions (Addendum)
Creo que tiene esguince de ligamento lateral interno de la rodilla derecha.  Siga elevando pierna, hielo como [employer], use tylenol para dolor como necesite y ibuprofeno 400-600mg  con comidas por 3-5 dias.  Use faja de rodilla.  Le voy a dar ejercicios para hacer tambien  Dejeme saber si no mejora con esto. Si no va mejorando en 1-2 semanas, haga cita con Dr Lorelei Pont para re-evaluacion.

## 2022-05-04 NOTE — Assessment & Plan Note (Addendum)
Anticipate MCL sprain based on exam and mechanism of injury.  Supportive measures reviewed as per instructions - rest, ice, elevation, compression and NSAID use reviewed. Discussed brace use.  MCL sprain handout provided.  Advised schedule sports med eval in 1-2 wks if not improving as expected. Pt agrees with plan.

## 2022-05-26 ENCOUNTER — Ambulatory Visit
Admission: RE | Admit: 2022-05-26 | Discharge: 2022-05-26 | Disposition: A | Discharge status: Home / Self Care | Payer: BC Managed Care – PPO | Source: Ambulatory Visit | Attending: Family Medicine | Admitting: Family Medicine

## 2022-05-26 DIAGNOSIS — Z1231 Encounter for screening mammogram for malignant neoplasm of breast: Secondary | ICD-10-CM | POA: Diagnosis not present

## 2022-05-26 DIAGNOSIS — M810 Age-related osteoporosis without current pathological fracture: Secondary | ICD-10-CM | POA: Insufficient documentation

## 2022-05-26 DIAGNOSIS — Z780 Asymptomatic menopausal state: Secondary | ICD-10-CM | POA: Diagnosis not present

## 2022-05-26 DIAGNOSIS — M8589 Other specified disorders of bone density and structure, multiple sites: Secondary | ICD-10-CM | POA: Diagnosis not present

## 2022-05-27 ENCOUNTER — Ambulatory Visit
Admission: RE | Admit: 2022-05-27 | Discharge: 2022-05-27 | Disposition: A | Discharge status: Home / Self Care | Payer: BC Managed Care – PPO | Source: Ambulatory Visit | Attending: Oncology | Admitting: Oncology

## 2022-05-27 ENCOUNTER — Other Ambulatory Visit (HOSPITAL_COMMUNITY): Payer: Self-pay

## 2022-05-27 DIAGNOSIS — C8218 Follicular lymphoma grade II, lymph nodes of multiple sites: Secondary | ICD-10-CM | POA: Insufficient documentation

## 2022-05-27 DIAGNOSIS — C8200 Follicular lymphoma grade I, unspecified site: Secondary | ICD-10-CM | POA: Diagnosis not present

## 2022-05-27 DIAGNOSIS — R918 Other nonspecific abnormal finding of lung field: Secondary | ICD-10-CM | POA: Diagnosis not present

## 2022-05-27 MED ORDER — IOHEXOL 300 MG/ML  SOLN
100.0000 mL | Freq: Once | INTRAMUSCULAR | Status: AC | PRN
  Administered 2022-05-27: 100 mL via INTRAVENOUS

## 2022-05-27 NOTE — Telephone Encounter (Signed)
Called insurance to follow up on PA.   PA approved. Effective 04/27/22-04/27/23

## 2022-05-27 NOTE — Telephone Encounter (Signed)
Pt ready for scheduling for Prolia on or after : 05/27/22  Out-of-pocket cost due at time of visit: $327  Primary: [employer] [place] Prolia co-insurance: 20% Admin fee co-insurance: 20%  Secondary: --- Prolia co-insurance:  Admin fee co-insurance:   Medical Benefit Details: Date Benefits were checked: 04/05/22 Deductible: $1610 met of $1350 required/ Coinsurance: 20%/ Admin Fee: 20%  Prior Auth: Approved PA# Expiration Date: 04/27/2023   Pharmacy benefit: Copay $--- If patient wants fill through the pharmacy benefit please send prescription to:  --- , and include estimated need by date in rx notes. Pharmacy will ship medication directly to the office.  Patient is eligible for Prolia Copay Card. Copay Card can make patient's cost as little as $25. Link to apply: [email or web address]  ** This summary of benefits is an estimation of the patient's out-of-pocket cost. Exact cost may very based on individual plan coverage.

## 2022-05-30 MED ORDER — ALENDRONATE SODIUM 70 MG PO TABS: 70.0000 mg | ORAL_TABLET | ORAL | 3 refills | Status: DC

## 2022-05-30 NOTE — Telephone Encounter (Addendum)
Spoke with pt relaying Dr. Timoteo Expose message. Pt verbalizes understanding, agrees to try Fosamax and scheduled 4 mo f/u on 10/03/22 at 8:30. Fyi to Dr. Reece Agar.   E-scribed Fosamax.

## 2022-05-30 NOTE — Telephone Encounter (Signed)
Called patient let her know that her co pay will be $327.00. she declined scheduling and would like to know if there is something that is less expensive for her to try. Let patient know I will send message to Dr. Sharen Hones and someone will give her call with his recommendation.

## 2022-05-30 NOTE — Addendum Note (Signed)
Addended by: Nanci Pina on: 05/30/2022 02:37 PM   Modules accepted: Orders

## 2022-05-30 NOTE — Telephone Encounter (Addendum)
She could try fosamax once weekly dosing. This is in same family as boniva which previously caused diffuse body aches - would she be willing to try this?  Would take Fosamax first thing in the morning on empty stomach with a glass of water, separate from medications and food, and need to stay upright after taking medication for 30-60 min. Let us know if any GI upset or body aches develop.  If desired, may send fosamax to pharmacy - I've pended Rx Schedule appt in August for follow up.

## 2022-05-30 NOTE — Addendum Note (Signed)
Addended by: Eustaquio Boyden on: 05/30/2022 01:59 PM   Modules accepted: Orders

## 2022-05-31 ENCOUNTER — Other Ambulatory Visit: Payer: Self-pay

## 2022-05-31 ENCOUNTER — Other Ambulatory Visit: Payer: Self-pay | Admitting: *Deleted

## 2022-05-31 DIAGNOSIS — C8218 Follicular lymphoma grade II, lymph nodes of multiple sites: Secondary | ICD-10-CM

## 2022-06-01 ENCOUNTER — Inpatient Hospital Stay: Payer: BC Managed Care – PPO | Attending: Oncology

## 2022-06-01 ENCOUNTER — Inpatient Hospital Stay (HOSPITAL_BASED_OUTPATIENT_CLINIC_OR_DEPARTMENT_OTHER): Payer: BC Managed Care – PPO | Admitting: Oncology

## 2022-06-01 ENCOUNTER — Other Ambulatory Visit: Payer: Self-pay

## 2022-06-01 ENCOUNTER — Encounter: Payer: Self-pay | Admitting: Oncology

## 2022-06-01 ENCOUNTER — Inpatient Hospital Stay: Payer: BC Managed Care – PPO

## 2022-06-01 DIAGNOSIS — C8218 Follicular lymphoma grade II, lymph nodes of multiple sites: Secondary | ICD-10-CM

## 2022-06-01 DIAGNOSIS — C8200 Follicular lymphoma grade I, unspecified site: Secondary | ICD-10-CM | POA: Insufficient documentation

## 2022-06-01 DIAGNOSIS — Z5112 Encounter for antineoplastic immunotherapy: Secondary | ICD-10-CM | POA: Insufficient documentation

## 2022-06-01 LAB — CBC WITH DIFFERENTIAL/PLATELET
Abs Immature Granulocytes: 0.05 10*3/uL (ref 0.00–0.07)
Basophils Absolute: 0.1 10*3/uL (ref 0.0–0.1)
Basophils Relative: 1 %
Eosinophils Absolute: 0.2 10*3/uL (ref 0.0–0.5)
Eosinophils Relative: 2 %
HCT: 44.2 % (ref 36.0–46.0)
Hemoglobin: 14.3 g/dL (ref 12.0–15.0)
Immature Granulocytes: 1 %
Lymphocytes Relative: 23 %
Lymphs Abs: 1.9 10*3/uL (ref 0.7–4.0)
MCH: 29.4 pg (ref 26.0–34.0)
MCHC: 32.4 g/dL (ref 30.0–36.0)
MCV: 90.9 fL (ref 80.0–100.0)
Monocytes Absolute: 0.9 10*3/uL (ref 0.1–1.0)
Monocytes Relative: 10 %
Neutro Abs: 5.5 10*3/uL (ref 1.7–7.7)
Neutrophils Relative %: 63 %
Platelets: 270 10*3/uL (ref 150–400)
RBC: 4.86 MIL/uL (ref 3.87–5.11)
RDW: 14.1 % (ref 11.5–15.5)
WBC: 8.6 10*3/uL (ref 4.0–10.5)
nRBC: 0 % (ref 0.0–0.2)

## 2022-06-01 LAB — CMP (CANCER CENTER ONLY)
ALT: 15 U/L (ref 0–44)
AST: 17 U/L (ref 15–41)
Albumin: 4.1 g/dL (ref 3.5–5.0)
Alkaline Phosphatase: 78 U/L (ref 38–126)
Anion gap: 7 (ref 5–15)
BUN: 18 mg/dL (ref 8–23)
CO2: 27 mmol/L (ref 22–32)
Calcium: 9.2 mg/dL (ref 8.9–10.3)
Chloride: 102 mmol/L (ref 98–111)
Creatinine: 0.58 mg/dL (ref 0.44–1.00)
GFR, Estimated: >60 mL/min (ref >60)
Glucose, Bld: 86 mg/dL (ref 70–99)
Potassium: 4.2 mmol/L (ref 3.5–5.1)
Sodium: 136 mmol/L (ref 135–145)
Total Bilirubin: 0.5 mg/dL (ref 0.3–1.2)
Total Protein: 6.9 g/dL (ref 6.5–8.1)

## 2022-06-01 MED ORDER — HEPARIN SOD (PORK) LOCK FLUSH 100 UNIT/ML IV SOLN
500.0000 [IU] | Freq: Once | INTRAVENOUS | Status: DC | PRN
  Filled 2022-06-01: qty 5

## 2022-06-01 MED ORDER — ACETAMINOPHEN 325 MG PO TABS
650.0000 mg | ORAL_TABLET | Freq: Once | ORAL | Status: AC
  Administered 2022-06-01: 650 mg via ORAL
  Filled 2022-06-01: qty 2

## 2022-06-01 MED ORDER — SODIUM CHLORIDE 0.9 % IV SOLN
375.0000 mg/m2 | Freq: Once | INTRAVENOUS | Status: AC
  Administered 2022-06-01: 700 mg via INTRAVENOUS
  Filled 2022-06-01: qty 50

## 2022-06-01 MED ORDER — SODIUM CHLORIDE 0.9 % IV SOLN
Freq: Once | INTRAVENOUS | Status: AC
  Filled 2022-06-01: qty 250

## 2022-06-01 MED ORDER — DIPHENHYDRAMINE HCL 25 MG PO CAPS
50.0000 mg | ORAL_CAPSULE | Freq: Once | ORAL | Status: AC
  Administered 2022-06-01: 50 mg via ORAL
  Filled 2022-06-01: qty 2

## 2022-06-01 NOTE — Progress Notes (Signed)
[facility]  [phone] [phone]  ID: Melinda Hall OB: [birth year]  MR#: [id number]  QIO#:[id number]  Patient Care Team: Eustaquio Boyden, MD as PCP - General (Family Medicine) Jeralyn Ruths, MD as Consulting Physician (Oncology) Lucy Chris, MD as Consulting Physician (Neurosurgery)  CHIEF COMPLAINT: Recurrent stage III follicular lymphoma, grade 1/2.    INTERVAL HISTORY: Patient returns to clinic today for further evaluation, discussion of her imaging results, and continuation of maintenance Rituxan.  She continues to feel well and remains asymptomatic.  She is tolerating her treatments without significant side effects.  She denies any fevers, night sweats, or weight loss.  She has no neurologic complaints.  She does not complain of any weakness or fatigue today. She has no chest pain, shortness of breath, cough, or hemoptysis.  She denies any nausea, vomiting, constipation, or diarrhea.  She has no urinary complaints.  Patient offers no specific complaints today.  REVIEW OF SYSTEMS:   Review of Systems  Constitutional: Negative.  Negative for diaphoresis, fever, malaise/fatigue and weight loss.  Respiratory: Negative.  Negative for cough, hemoptysis and shortness of breath.   Cardiovascular: Negative.  Negative for chest pain and leg swelling.  Gastrointestinal: Negative.  Negative for abdominal pain.  Genitourinary: Negative.  Negative for dysuria.  Musculoskeletal: Negative.  Negative for back pain and myalgias.  Skin: Negative.  Negative for rash.  Neurological: Negative.  Negative for tingling, sensory change, focal weakness, weakness and headaches.  Psychiatric/Behavioral: Negative.  Negative for depression. The patient is not nervous/anxious.     As per HPI. Otherwise, a complete review of systems is negative.  PAST MEDICAL HISTORY: Past Medical History:  Diagnosis Date   Cancer (HCC)    Dermoid tumor    hx of--left ovary    Follicular lymphoma (HCC)    Gastric bypass status for obesity 2004   s/p roux-en-y   Generalized headaches    frequent   Heart murmur    History of cardiac murmur    Intraductal papilloma of breast, left 06/18/2017   05/2017 - s/p excision 07/2017   Osteoporosis 03/2016   DEXA -2.6 spine   Salivary gland stone    x 2   Smoker     PAST SURGICAL HISTORY: Past Surgical History:  Procedure Laterality Date   ABDOMINAL HYSTERECTOMY     only ovary was removed   ANTERIOR CERVICAL DECOMP/DISCECTOMY FUSION  12/2016   Pool   APPENDECTOMY  1980   BILATERAL OOPHORECTOMY  ~1992   for dermoid ovarian cysts   BREAST BIOPSY Left 06/13/2017   papilloma with excision 07/2017   BREAST LUMPECTOMY WITH NEEDLE LOCALIZATION Left 08/10/2017   intraductal papilloma - Sterling Big F, MD   CHOLECYSTECTOMY  2000s   COLONOSCOPY WITH PROPOFOL N/A 11/27/2015   TA rpt 5 yrs Midge Minium, MD)   COLONOSCOPY WITH PROPOFOL N/A 05/17/2021   TAs, SSPs, rpt 3 yrs Servando Snare, Darren, MD)   GASTRIC BYPASS  2004   roux en y [facility])   POLYPECTOMY  11/27/2015   Procedure: POLYPECTOMY;  Surgeon: Midge Minium, MD;  Location: [facility];  Service: Endoscopy;;   POLYPECTOMY  05/17/2021   Procedure: POLYPECTOMY;  Surgeon: Midge Minium, MD;  Location: [facility];  Service: Endoscopy;;   REDUCTION MAMMAPLASTY Bilateral 1990's   SALIVARY GLAND SURGERY     SALIVARY STONE REMOVAL      FAMILY HISTORY: Family History  Problem Relation Age of Onset   Cancer Father 83  leukemia   Hypertension Mother    Diabetes Neg Hx    Stroke Neg Hx    Coronary artery disease Neg Hx    Breast cancer Neg Hx     ADVANCED DIRECTIVES (Y/N):  N  HEALTH MAINTENANCE: Social History   Tobacco Use   Smoking status: Former    Packs/day: 0.25    Years: 20.00    Additional pack years: 0.00    Total pack years: 5.00    Types: Cigarettes    Quit date: 10/31/2012    Years since quitting: 9.5   Smokeless tobacco:  Never  Vaping Use   Vaping Use: Never used  Substance Use Topics   Alcohol use: Not Currently   Drug use: No     Colonoscopy:  PAP:  Bone density:  Lipid panel:  Allergies  Allergen Reactions   Boniva [Ibandronic Acid] Other (See Comments)    Diffuse body aches    Current Outpatient Medications  Medication Sig Dispense Refill   alendronate (FOSAMAX) 70 MG tablet Take 1 tablet (70 mg total) by mouth every 7 (seven) days. Take with a full glass of water on an empty stomach. 4 tablet 3   buPROPion ER (WELLBUTRIN SR) 100 MG 12 hr tablet Take 1 tablet (100 mg total) by mouth in the morning. 90 tablet 4   calcium carbonate (OS-CAL) 600 MG TABS tablet Take 600 mg by mouth daily with breakfast.     Cholecalciferol (VITAMIN D3) 1000 units CAPS Take 3 capsules (3,000 Units total) by mouth daily.     ferrous sulfate 325 (65 FE) MG tablet Take 1 tablet (325 mg total) by mouth every Monday, Wednesday, and Friday.     Magnesium 250 MG TABS Take 1 tablet (250 mg total) by mouth daily.  0   vitamin B-12 (CYANOCOBALAMIN) 1000 MCG tablet Take 1 tablet (1,000 mcg total) by mouth daily.     zinc gluconate 50 MG tablet Take 1 tablet (50 mg total) by mouth daily.     No current facility-administered medications for this visit.   Facility-Administered Medications Ordered in Other Visits  Medication Dose Route Frequency Provider Last Rate Last Admin   heparin lock flush 100 unit/mL  500 Units Intracatheter Once PRN Jeralyn Ruths, MD        OBJECTIVE: Vitals:   06/01/22 0841  BP: 124/87  Pulse: (!) 59  Resp: 16  Temp: (!) 96.4 F (35.8 C)  SpO2: 100%     Body mass index is 29.19 kg/m.    ECOG FS:0 - Asymptomatic  General: Well-developed, well-nourished, no acute distress. Eyes: Pink conjunctiva, anicteric sclera. HEENT: Normocephalic, moist mucous membranes. Lungs: No audible wheezing or coughing. Heart: Regular rate and rhythm. Abdomen: Soft, nontender, no obvious  distention. Musculoskeletal: No edema, cyanosis, or clubbing. Neuro: Alert, answering all questions appropriately. Cranial nerves grossly intact. Skin: No rashes or petechiae noted. Psych: Normal affect.  LAB RESULTS:  Lab Results  Component Value Date   NA 136 06/01/2022   K 4.2 06/01/2022   CL 102 06/01/2022   CO2 27 06/01/2022   GLUCOSE 86 06/01/2022   BUN 18 06/01/2022   CREATININE 0.58 06/01/2022   CALCIUM 9.2 06/01/2022   PROT 6.9 06/01/2022   ALBUMIN 4.1 06/01/2022   AST 17 06/01/2022   ALT 15 06/01/2022   ALKPHOS 78 06/01/2022   BILITOT 0.5 06/01/2022   GFRNONAA >60 06/01/2022   GFRAA >60 07/10/2013    Lab Results  Component Value Date   WBC  8.6 06/01/2022   NEUTROABS 5.5 06/01/2022   HGB 14.3 06/01/2022   HCT 44.2 06/01/2022   MCV 90.9 06/01/2022   PLT 270 06/01/2022     STUDIES: CT CHEST ABDOMEN PELVIS W CONTRAST  Result Date: 05/30/2022 CLINICAL DATA:  Restaging follicular lymphoma, ongoing treatment * Tracking Code: BO * EXAM: CT CHEST, ABDOMEN, AND PELVIS WITH CONTRAST TECHNIQUE: Multidetector CT imaging of the chest, abdomen and pelvis was performed following the standard protocol during bolus administration of intravenous contrast. RADIATION DOSE REDUCTION: This exam was performed according to the departmental dose-optimization program which includes automated exposure control, adjustment of the mA and/or kV according to patient size and/or use of iterative reconstruction technique. CONTRAST:  OMNIPAQUE IOHEXOL 300 MG/ML SOLN additional oral enteric contrast COMPARISON:  12/13/2021 FINDINGS: CT CHEST FINDINGS Cardiovascular: Aortic atherosclerosis. Normal heart size. No pericardial effusion. Mediastinum/Nodes: No enlarged mediastinal, hilar, or axillary lymph nodes. Unchanged thymic rebound in the anterior mediastinum (series 2, image 14) thyroid gland, trachea, and esophagus demonstrate no significant findings. Lungs/Pleura: Scattered ground-glass  airspace opacities, which are fluctuant compared to prior examination (series 3, image 41) no pleural effusion or pneumothorax. Musculoskeletal: No chest wall abnormality. No acute osseous findings. CT ABDOMEN PELVIS FINDINGS Hepatobiliary: No focal liver abnormality is seen. Status post cholecystectomy. No biliary dilatation. Pancreas: Unremarkable. No pancreatic ductal dilatation or surrounding inflammatory changes. Spleen: Normal in size without significant abnormality. Adrenals/Urinary Tract: Adrenal glands are unremarkable. Kidneys are normal, without renal calculi, solid lesion, or hydronephrosis. Bladder is unremarkable. Stomach/Bowel: Status post Roux type gastric bypass. Appendix is not clearly visualized and may be surgically absent. No evidence of bowel wall thickening, distention, or inflammatory changes. Vascular/Lymphatic: No significant vascular findings are present. Unchanged, enlarged retroperitoneal mass or lymph node underlying the inferior vena cava measuring 3.4 x 2.1 cm (series 2, image 60). No other enlarged abdominal or pelvic lymph nodes. Reproductive: No mass or other abnormality. Other: No abdominal wall hernia or abnormality. No ascites. Musculoskeletal: No acute osseous findings. IMPRESSION: 1. Unchanged, enlarged retroperitoneal mass or lymph node underlying the inferior vena cava measuring 3.4 x 2.1 cm. No other enlarged lymph nodes in the chest, abdomen, or pelvis. 2. Scattered ground-glass airspace opacities, which are fluctuant compared to prior examination. Findings are consistent with ongoing nonspecific infection or inflammation, including drug toxicity in the setting of chemotherapy or immunotherapy. 3. Status post Roux type gastric bypass. Aortic Atherosclerosis (ICD10-I70.0). Electronically Signed   By: Jearld Lesch M.D.   On: 05/30/2022 15:02   MM 3D SCREENING MAMMOGRAM BILATERAL BREAST  Result Date: 05/27/2022 CLINICAL DATA:  Screening. EXAM: DIGITAL SCREENING BILATERAL  MAMMOGRAM WITH TOMOSYNTHESIS AND CAD TECHNIQUE: Bilateral screening digital craniocaudal and mediolateral oblique mammograms were obtained. Bilateral screening digital breast tomosynthesis was performed. The images were evaluated with computer-aided detection. COMPARISON:  Previous exam(s). ACR Breast Density Category c: The breasts are heterogeneously dense, which may obscure small masses. FINDINGS: There are no findings suspicious for malignancy. IMPRESSION: No mammographic evidence of malignancy. A result letter of this screening mammogram will be mailed directly to the patient. RECOMMENDATION: Screening mammogram in one year. (Code:[id number]-B-01Y) BI-RADS CATEGORY  1: Negative. Electronically Signed   By: Annia Belt M.D.   On: 05/27/2022 15:21   DG Bone Density  Result Date: 05/26/2022 EXAM: DUAL X-RAY ABSORPTIOMETRY (DXA) FOR BONE MINERAL DENSITY IMPRESSION: Your patient Melinda Hall completed a BMD test on 05/26/2022 using the [employer] iDXA DXA System (software version: 14.10) manufactured by [employer]. The following summarizes the  results of our evaluation. Technologist: SCE PATIENT BIOGRAPHICAL: Name: Hall, Melinda Patient ID: [id number] Birth Date: 09-06-1960 Height: 63.0 in. Gender: Female Exam Date: 05/26/2022 Weight: 161.5 lbs. Indications: Height Loss, Hysterectomy, Lymphoma, Oophorectomy Bilateral, Postmenopausal, Vitamin D Deficiency Fractures: Treatments: calcium w/ vit D, Prolia, Vitamin D DENSITOMETRY RESULTS: Site      Region      Measured Date Measured Age WHO Classification Young Adult T-score BMD         %Change vs. Previous Significant Change (*) AP Spine L2-L4 05/26/2021 62.5 Osteopenia -1.9 0.983 g/cm2 11.8% Yes AP Spine L2-L4 02/13/2019 59.2 Osteoporosis -2.7 0.879 g/cm2 -2.0% - AP Spine L2-L4 12/01/2015 55.0 Osteoporosis -2.6 0.897 g/cm2 - - DualFemur Total Right 05/26/2022 62.5 Osteopenia -1.4 0.832 g/cm2 9.8% Yes DualFemur Total Right 02/13/2019 59.2 Osteopenia -2.0 0.758  g/cm2 -6.5% Yes DualFemur Total Right 12/01/2015 56.0 Osteopenia -1.6 0.811 g/cm2 - - DualFemur Total Mean 05/26/2022 62.5 Osteopenia -1.3 0.840 g/cm2 8.1% Yes DualFemur Total Mean 02/13/2019 59.2 Osteopenia -1.8 0.777 g/cm2 -3.8% Yes DualFemur Total Mean 12/01/2015 56.0 Osteopenia -1.6 0.808 g/cm2 - - ASSESSMENT: The BMD measured at AP Spine L2-L4 is 0.983 g/cm2 with a T-score of -1.9. This patient is considered osteopenic according to World Health Organization [facility]) criteria. The scan quality is good. Compared with prior study, there has been a significant increase in the spine. Compared with prior study, there has been a significant increase in the total hip. L1 was excluded due to degenerative changes. Patient is not a candidate for FRAX due to Prolia. World Science writer [facility]) criteria for post-menopausal, Caucasian Women: Normal:                   T-score at or above -1 SD Osteopenia/low bone mass: T-score between -1 and -2.5 SD Osteoporosis:             T-score at or below -2.5 SD RECOMMENDATIONS: 1. All patients should optimize calcium and vitamin D intake. 2. Consider FDA-approved medical therapies in postmenopausal women and men aged 76 years and older, based on the following: a. A hip or vertebral(clinical or morphometric) fracture b. T-score < -2.5 at the femoral neck or spine after appropriate evaluation to exclude secondary causes c. Low bone mass (T-score between -1.0 and -2.5 at the femoral neck or spine) and a 10-year probability of a hip fracture > 3% or a 10-year probability of a major osteoporosis-related fracture > 20% based on the [place]-adapted WHO algorithm 3. Clinician judgment and/or patient preferences may indicate treatment for people with 10-year fracture probabilities above or below these levels FOLLOW-UP: People with diagnosed cases of osteoporosis or at high risk for fracture should have regular bone mineral density tests. For patients eligible for Medicare, routine testing is  allowed once every 2 years. The testing frequency can be increased to one year for patients who have rapidly progressing disease, those who are receiving or discontinuing medical therapy to restore bone mass, or have additional risk factors. I have reviewed this report, and agree with the above findings. Mark A. Tyron Russell, M.D. [facility] Electronically Signed   By: Ulyses Southward M.D.   On: 05/26/2022 10:39    ONCOLOGY HISTORY: Retroperitoneal lymph node biopsy on April 21, 2020 confirmed the diagnosis. PET scan on April 14, 2020 revealed a hypermetabolic right sided retroperitoneal nodal mass along with hypermetabolic mesenteric adenopathy.  Patient also noted to have bilateral axillary adenopathy.  Although patient was asymptomatic and active surveillance was offered, she elected to pursue treatment  and received IV Rituxan weekly x4 completing on May 28, 2020.  Repeat PET scan from August 24, 2020 reviewed independently with significant treatment response, although likely residual disease.  Patient previously declined maintenance Rituxan.   ASSESSMENT: Recurrent stage III follicular lymphoma, grade 1/2.    PLAN:    Recurrent stage III follicular lymphoma, grade 1/2:  Repeat imaging on March 15, 2021 reviewed independently with progression of disease.  Patient subsequently reinitiated treatment and received weekly Rituxan x 4 between March 31, 2021 and April 21, 2021.  She then initiated maintenance Rituxan every 8 weeks on Jun 23, 2021.  Will continue treatment every 8 weeks for up to 2 years. Can also consider Zevalin in the future if necessary.  Repeat CT scan on May 27, 2022 reviewed independently and reported as above with no obvious evidence of recurrent or progressive disease.  Enlarged retroperitoneal lymph node is unchanged in size.  Proceed with cycle 6 of maintenance Rituxan today.  Return to clinic in 8 weeks for further evaluation and consideration of cycle 7.  Repeat imaging in  approximately October 2024.   Hypermetabolic thyroid nodule: Previously, biopsy was negative for malignancy. Myalgias: Patient does not complain of this today.  Continue ibuprofen as needed. Depression: Significantly improved.  Continue current medications as prescribed.   I spent a total of 30 minutes reviewing chart data, face-to-face evaluation with the patient, counseling and coordination of care as detailed above.   Patient expressed understanding and was in agreement with this plan. She also understands that She can call clinic at any time with any questions, concerns, or complaints.    Cancer Staging  Follicular lymphoma (HCC) Staging form: Hodgkin and Non-Hodgkin Lymphoma, AJCC 8th Edition - Clinical stage from 04/29/2020: Stage III (Follicular lymphoma) - Signed by Jeralyn Ruths, MD on 04/29/2020 Stage prefix: Initial diagnosis   Jeralyn Ruths, MD   06/01/2022 11:09 AM

## 2022-07-12 ENCOUNTER — Other Ambulatory Visit: Payer: Self-pay | Admitting: Oncology

## 2022-07-22 ENCOUNTER — Encounter: Payer: Self-pay | Admitting: Family Medicine

## 2022-07-22 ENCOUNTER — Ambulatory Visit (INDEPENDENT_AMBULATORY_CARE_PROVIDER_SITE_OTHER): Payer: BC Managed Care – PPO | Admitting: Family Medicine

## 2022-07-22 VITALS — BP 118/76 | HR 72 | Temp 97.3°F | Ht 63.0 in | Wt 167.2 lb

## 2022-07-22 DIAGNOSIS — M5416 Radiculopathy, lumbar region: Secondary | ICD-10-CM

## 2022-07-22 DIAGNOSIS — C8210 Follicular lymphoma grade II, unspecified site: Secondary | ICD-10-CM

## 2022-07-22 DIAGNOSIS — M7751 Other enthesopathy of right foot: Secondary | ICD-10-CM | POA: Diagnosis not present

## 2022-07-22 MED ORDER — NAPROXEN 500 MG PO TABS: 500.0000 mg | ORAL_TABLET | Freq: Two times a day (BID) | ORAL | 0 refills | Status: DC

## 2022-07-22 NOTE — Progress Notes (Unsigned)
Ph: [phone] Fax: [phone]   Patient ID: Melinda Hall, female    DOB: 1960/02/02, 62 y.o.   MRN: [id number]  This visit was conducted in person.  BP 118/76   Pulse 72   Temp (!) 97.3 F (36.3 C) (Temporal)   Ht 5\' 3"  (1.6 m)   Wt 167 lb 4 oz (75.9 kg)   SpO2 97%   BMI 29.63 kg/m    CC: R foot pain at heel  Subjective:   HPI: Melinda Hall is a 62 y.o. female presenting on 07/22/2022 for Foot Pain (C/o R foot pain in heel. Started about 2 mos ago. Pain is constant now. 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   2 mo h/o R foot pain posterior heel. Painful to walk. One day noted L foot swelled up but that has since resolved. No other redness, warmth of foot.  Has tried no medications for this.  Trouble walking due to pain.  New lower lumbar back pain after prolonged car ride. No radiculopathy, saddle anesthesia, fever, bowel/bladder incontinence.  Denies inciting trauma/injury or falls.   Ongoing chronic numbness to left lateral foot, more recently feeling tightness to left heel into achilles tendon.   Recurrent stage III follicular lymphoma followed by oncology. Getting infusions q2 months since 05/2021 planned 2 yr treatment.   MRI lumbar spine 2022 IMPRESSION: 1. Mild multilevel lumbar spondylosis of the lumbar spine without interval progression compared to previous MRI 07/29/2016. 2. Mild-to-moderate left and borderline-mild right foraminal stenosis at L4-L5.     Relevant past medical, surgical, family and social history reviewed and updated as indicated. Interim medical history since our last visit reviewed. Allergies and medications reviewed and updated. Outpatient Medications Prior to Visit  Medication Sig Dispense Refill   alendronate (FOSAMAX) 70 MG tablet Take 1 tablet (70 mg total) by mouth every 7 (seven) days. Take with a full glass of water on an empty stomach. 4 tablet 3   buPROPion ER (WELLBUTRIN SR) 100 MG 12 hr tablet Take 1  tablet (100 mg total) by mouth in the morning. 90 tablet 4   calcium carbonate (OS-CAL) 600 MG TABS tablet Take 600 mg by mouth daily with breakfast.     Cholecalciferol (VITAMIN D3) 1000 units CAPS Take 3 capsules (3,000 Units total) by mouth daily.     ferrous sulfate 325 (65 FE) MG tablet Take 1 tablet (325 mg total) by mouth every Monday, Wednesday, and Friday.     Magnesium 250 MG TABS Take 1 tablet (250 mg total) by mouth daily.  0   vitamin B-12 (CYANOCOBALAMIN) 1000 MCG tablet Take 1 tablet (1,000 mcg total) by mouth daily.     zinc gluconate 50 MG tablet Take 1 tablet (50 mg total) by mouth daily.     No facility-administered medications prior to visit.     Per HPI unless specifically indicated in ROS section below Review of Systems  Objective:  BP 118/76   Pulse 72   Temp (!) 97.3 F (36.3 C) (Temporal)   Ht 5\' 3"  (1.6 m)   Wt 167 lb 4 oz (75.9 kg)   SpO2 97%   BMI 29.63 kg/m   Wt Readings from Last 3 Encounters:  07/22/22 167 lb 4 oz (75.9 kg)  06/01/22 164 lb 12.8 oz (74.8 kg)  05/04/22 163 lb 4 oz (74 kg)      Physical Exam    Results for orders placed or performed in visit on 06/01/22  CMP (Cancer Center only)  Result Value Ref Range   Sodium 136 135 - 145 mmol/L   Potassium 4.2 3.5 - 5.1 mmol/L   Chloride 102 98 - 111 mmol/L   CO2 27 22 - 32 mmol/L   Glucose, Bld 86 70 - 99 mg/dL   BUN 18 8 - 23 mg/dL   Creatinine 6.21 3.08 - 1.00 mg/dL   Calcium 9.2 8.9 - 65.7 mg/dL   Total Protein 6.9 6.5 - 8.1 g/dL   Albumin 4.1 3.5 - 5.0 g/dL   AST 17 15 - 41 U/L   ALT 15 0 - 44 U/L   Alkaline Phosphatase 78 38 - 126 U/L   Total Bilirubin 0.5 0.3 - 1.2 mg/dL   GFR, Estimated >84 >69 mL/min   Anion gap 7 5 - 15  CBC with Differential/Platelet  Result Value Ref Range   WBC 8.6 4.0 - 10.5 K/uL   RBC 4.86 3.87 - 5.11 MIL/uL   Hemoglobin 14.3 12.0 - 15.0 g/dL   HCT 62.9 52.8 - 41.3 %   MCV 90.9 80.0 - 100.0 fL   MCH 29.4 26.0 - 34.0 pg   MCHC 32.4 30.0 - 36.0  g/dL   RDW 24.4 01.0 - 27.2 %   Platelets 270 150 - 400 K/uL   nRBC 0.0 0.0 - 0.2 %   Neutrophils Relative % 63 %   Neutro Abs 5.5 1.7 - 7.7 K/uL   Lymphocytes Relative 23 %   Lymphs Abs 1.9 0.7 - 4.0 K/uL   Monocytes Relative 10 %   Monocytes Absolute 0.9 0.1 - 1.0 K/uL   Eosinophils Relative 2 %   Eosinophils Absolute 0.2 0.0 - 0.5 K/uL   Basophils Relative 1 %   Basophils Absolute 0.1 0.0 - 0.1 K/uL   Immature Granulocytes 1 %   Abs Immature Granulocytes 0.05 0.00 - 0.07 K/uL    Assessment & Plan:   Problem List Items Addressed This Visit   None    Meds ordered this encounter  Medications   naproxen (NAPROSYN) 500 MG tablet    Sig: Take 1 tablet (500 mg total) by mouth 2 (two) times daily with a meal. For 1 week then as needed    Dispense:  40 tablet    Refill:  0    No orders of the defined types were placed in this encounter.   Patient Instructions  Creo que tiene bursitis del tobillo izquierdo y de pronto inflamacion del tendon cuando se entra al hueso del tobillo.  Use naproxen 500mg  dos veces al dia con comida por 1 semana y luego cuando necesite. Puede usar voltaren topical tambien (sin receta).  Compre heel lift gel inserts para los zapatos. Voy a ver si tengo ejercicios para esto.  Si no mejora con esto, puede hacer cita con Dr Patsy Lager.   Follow up plan: Return if symptoms worsen or fail to improve.  Eustaquio Boyden, MD

## 2022-07-22 NOTE — Patient Instructions (Addendum)
Creo que tiene bursitis del tobillo izquierdo y de pronto inflamacion del tendon cuando se entra al hueso del tobillo.  Use naproxen 500mg  dos veces al dia con comida por 1 semana y luego cuando necesite. Puede usar voltaren topical tambien (sin receta).  Compre heel lift gel inserts para los zapatos. Voy a ver si tengo ejercicios para esto.  Si no mejora con esto, puede hacer cita con Dr Patsy Lager.

## 2022-07-23 ENCOUNTER — Encounter: Payer: Self-pay | Admitting: Family Medicine

## 2022-07-23 DIAGNOSIS — M7661 Achilles tendinitis, right leg: Secondary | ICD-10-CM | POA: Insufficient documentation

## 2022-07-23 DIAGNOSIS — M7751 Other enthesopathy of right foot: Secondary | ICD-10-CM | POA: Insufficient documentation

## 2022-07-23 NOTE — Assessment & Plan Note (Addendum)
Anticipate R lateral retrocalcaneal bursitis r/o achilles tendinopathy/enthesitis present for 2 months.  Supportive measures recommended including leg elevation, heel lift gel pad use, supportive shoes, Rx NSAID course naprosyn 500mg  BID with meals x1-2 wks then PRN.  Provided with exercises for achilles tendonitis.  May see sports med if not improved with this.  Exam not consistent with lumbar radiculopathy.

## 2022-07-23 NOTE — Assessment & Plan Note (Addendum)
H/o this, overall stable period.  Does have chronic L lateral foot numbness.

## 2022-07-23 NOTE — Assessment & Plan Note (Signed)
Appreciate oncology care - receiving Q8 wk maintenance rituximab therapy planned for 2 yrs to complete 05/2023.

## 2022-07-27 ENCOUNTER — Other Ambulatory Visit: Payer: Self-pay

## 2022-07-27 ENCOUNTER — Ambulatory Visit: Payer: BC Managed Care – PPO | Admitting: Medical Oncology

## 2022-07-27 ENCOUNTER — Ambulatory Visit: Payer: BC Managed Care – PPO

## 2022-07-27 ENCOUNTER — Other Ambulatory Visit: Payer: BC Managed Care – PPO

## 2022-07-27 DIAGNOSIS — C8200 Follicular lymphoma grade I, unspecified site: Secondary | ICD-10-CM

## 2022-07-28 ENCOUNTER — Encounter: Payer: Self-pay | Admitting: Nurse Practitioner

## 2022-07-28 ENCOUNTER — Inpatient Hospital Stay: Payer: BC Managed Care – PPO

## 2022-07-28 ENCOUNTER — Inpatient Hospital Stay (HOSPITAL_BASED_OUTPATIENT_CLINIC_OR_DEPARTMENT_OTHER): Payer: BC Managed Care – PPO | Admitting: Nurse Practitioner

## 2022-07-28 ENCOUNTER — Encounter: Payer: Self-pay | Admitting: Oncology

## 2022-07-28 ENCOUNTER — Inpatient Hospital Stay: Payer: BC Managed Care – PPO | Attending: Oncology

## 2022-07-28 VITALS — BP 118/90 | HR 64 | Temp 95.4°F | Resp 18 | Ht 63.0 in | Wt 165.8 lb

## 2022-07-28 DIAGNOSIS — C8218 Follicular lymphoma grade II, lymph nodes of multiple sites: Secondary | ICD-10-CM

## 2022-07-28 DIAGNOSIS — Z5112 Encounter for antineoplastic immunotherapy: Secondary | ICD-10-CM | POA: Insufficient documentation

## 2022-07-28 DIAGNOSIS — E875 Hyperkalemia: Secondary | ICD-10-CM

## 2022-07-28 DIAGNOSIS — C8200 Follicular lymphoma grade I, unspecified site: Secondary | ICD-10-CM

## 2022-07-28 LAB — COMPREHENSIVE METABOLIC PANEL
ALT: 22 U/L (ref 0–44)
AST: 19 U/L (ref 15–41)
Albumin: 4.2 g/dL (ref 3.5–5.0)
Alkaline Phosphatase: 54 U/L (ref 38–126)
Anion gap: 7 (ref 5–15)
BUN: 29 mg/dL — ABNORMAL HIGH (ref 8–23)
CO2: 25 mmol/L (ref 22–32)
Calcium: 9.3 mg/dL (ref 8.9–10.3)
Chloride: 106 mmol/L (ref 98–111)
Creatinine, Ser: 0.72 mg/dL (ref 0.44–1.00)
GFR, Estimated: >60 mL/min (ref >60)
Glucose, Bld: 92 mg/dL (ref 70–99)
Potassium: 5.3 mmol/L — ABNORMAL HIGH (ref 3.5–5.1)
Sodium: 138 mmol/L (ref 135–145)
Total Bilirubin: 0.7 mg/dL (ref 0.3–1.2)
Total Protein: 6.8 g/dL (ref 6.5–8.1)

## 2022-07-28 LAB — CBC WITH DIFFERENTIAL/PLATELET
Abs Immature Granulocytes: 0.02 10*3/uL (ref 0.00–0.07)
Basophils Absolute: 0 10*3/uL (ref 0.0–0.1)
Basophils Relative: 1 %
Eosinophils Absolute: 0.1 10*3/uL (ref 0.0–0.5)
Eosinophils Relative: 2 %
HCT: 46.3 % — ABNORMAL HIGH (ref 36.0–46.0)
Hemoglobin: 15.2 g/dL — ABNORMAL HIGH (ref 12.0–15.0)
Immature Granulocytes: 0 %
Lymphocytes Relative: 26 %
Lymphs Abs: 1.9 10*3/uL (ref 0.7–4.0)
MCH: 29.8 pg (ref 26.0–34.0)
MCHC: 32.8 g/dL (ref 30.0–36.0)
MCV: 90.8 fL (ref 80.0–100.0)
Monocytes Absolute: 0.8 10*3/uL (ref 0.1–1.0)
Monocytes Relative: 11 %
Neutro Abs: 4.4 10*3/uL (ref 1.7–7.7)
Neutrophils Relative %: 60 %
Platelets: 236 10*3/uL (ref 150–400)
RBC: 5.1 MIL/uL (ref 3.87–5.11)
RDW: 13.4 % (ref 11.5–15.5)
WBC: 7.3 10*3/uL (ref 4.0–10.5)
nRBC: 0 % (ref 0.0–0.2)

## 2022-07-28 MED ORDER — ACETAMINOPHEN 325 MG PO TABS
650.0000 mg | ORAL_TABLET | Freq: Once | ORAL | Status: AC
  Administered 2022-07-28: 650 mg via ORAL
  Filled 2022-07-28: qty 2

## 2022-07-28 MED ORDER — SODIUM CHLORIDE 0.9 % IV SOLN
375.0000 mg/m2 | Freq: Once | INTRAVENOUS | Status: AC
  Administered 2022-07-28: 700 mg via INTRAVENOUS
  Filled 2022-07-28: qty 50

## 2022-07-28 MED ORDER — DIPHENHYDRAMINE HCL 25 MG PO CAPS
50.0000 mg | ORAL_CAPSULE | Freq: Once | ORAL | Status: AC
  Administered 2022-07-28: 50 mg via ORAL
  Filled 2022-07-28: qty 2

## 2022-07-28 MED ORDER — SODIUM CHLORIDE 0.9 % IV SOLN
Freq: Once | INTRAVENOUS | Status: AC
  Filled 2022-07-28: qty 250

## 2022-07-28 NOTE — Progress Notes (Signed)
[facility]  Telephone:([phone] Fax:([phone]  ID: Melinda Hall OB: 21-Sep-1960  MR#: [id number]  AOZ#:[id number]  Patient Care Team: Eustaquio Boyden, MD as PCP - General (Family Medicine) Jeralyn Ruths, MD as Consulting Physician (Oncology) Lucy Chris, MD as Consulting Physician (Neurosurgery)  CHIEF COMPLAINT: Recurrent stage III follicular lymphoma, grade 1/2.    INTERVAL HISTORY: Patient returns to clinic today for further evaluation and continuation of maintenance Rituxan.  She continues to feel well and remains asymptomatic.  She is tolerating her treatments without significant side effects. She has some hot flashes and night sweats a couple of times a week but only in week 6-8 prior to her next cycle of rituximab. Not worsening. She denies any fevers or weight loss.  She has no neurologic complaints.  She does not complain of any weakness or fatigue today. She has no chest pain, shortness of breath, cough, or hemoptysis.  She denies any nausea, vomiting, constipation, or diarrhea.  She has no urinary complaints.  Patient offers no specific complaints today.  REVIEW OF SYSTEMS:   Review of Systems  Constitutional: Negative.  Negative for diaphoresis, fever, malaise/fatigue and weight loss.  Respiratory: Negative.  Negative for cough, hemoptysis and shortness of breath.   Cardiovascular: Negative.  Negative for chest pain and leg swelling.  Gastrointestinal: Negative.  Negative for abdominal pain.  Genitourinary: Negative.  Negative for dysuria.  Musculoskeletal: Negative.  Negative for back pain and myalgias.  Skin: Negative.  Negative for rash.  Neurological: Negative.  Negative for tingling, sensory change, focal weakness, weakness and headaches.  Psychiatric/Behavioral: Negative.  Negative for depression. The patient is not nervous/anxious.   As per HPI. Otherwise, a complete review of systems is negative.  PAST MEDICAL HISTORY: Past  Medical History:  Diagnosis Date   Cancer (HCC)    Dermoid tumor    hx of--left ovary   Follicular lymphoma (HCC)    Gastric bypass status for obesity 2004   s/p roux-en-y   Generalized headaches    frequent   Heart murmur    History of cardiac murmur    Intraductal papilloma of breast, left 06/18/2017   05/2017 - s/p excision 07/2017   Osteoporosis 03/2016   DEXA -2.6 spine   Salivary gland stone    x 2   Smoker     PAST SURGICAL HISTORY: Past Surgical History:  Procedure Laterality Date   ABDOMINAL HYSTERECTOMY     only ovary was removed   ANTERIOR CERVICAL DECOMP/DISCECTOMY FUSION  12/2016   Pool   APPENDECTOMY  1980   BILATERAL OOPHORECTOMY  ~1992   for dermoid ovarian cysts   BREAST BIOPSY Left 06/13/2017   papilloma with excision 07/2017   BREAST LUMPECTOMY WITH NEEDLE LOCALIZATION Left 08/10/2017   intraductal papilloma - Sterling Big F, MD   CHOLECYSTECTOMY  2000s   COLONOSCOPY WITH PROPOFOL N/A 11/27/2015   TA rpt 5 yrs Midge Minium, MD)   COLONOSCOPY WITH PROPOFOL N/A 05/17/2021   TAs, SSPs, rpt 3 yrs Servando Snare, Darren, MD)   GASTRIC BYPASS  2004   roux en y [facility])   POLYPECTOMY  11/27/2015   Procedure: POLYPECTOMY;  Surgeon: Midge Minium, MD;  Location: [facility];  Service: Endoscopy;;   POLYPECTOMY  05/17/2021   Procedure: POLYPECTOMY;  Surgeon: Midge Minium, MD;  Location: [facility];  Service: Endoscopy;;   REDUCTION MAMMAPLASTY Bilateral 1990's   SALIVARY GLAND SURGERY     SALIVARY STONE REMOVAL      FAMILY HISTORY:  Family History  Problem Relation Age of Onset   Cancer Father 41       leukemia   Hypertension Mother    Diabetes Neg Hx    Stroke Neg Hx    Coronary artery disease Neg Hx    Breast cancer Neg Hx     ADVANCED DIRECTIVES (Y/N):  N  HEALTH MAINTENANCE: Social History   Tobacco Use   Smoking status: Former    Packs/day: 0.25    Years: 20.00    Additional pack years: 0.00    Total pack years: 5.00    Types:  Cigarettes    Quit date: 10/31/2012    Years since quitting: 9.7   Smokeless tobacco: Never  Vaping Use   Vaping Use: Never used  Substance Use Topics   Alcohol use: Not Currently   Drug use: No     Colonoscopy:  PAP:  Bone density:  Lipid panel:  Allergies  Allergen Reactions   Boniva [Ibandronic Acid] Other (See Comments)    Diffuse body aches    Current Outpatient Medications  Medication Sig Dispense Refill   alendronate (FOSAMAX) 70 MG tablet Take 1 tablet (70 mg total) by mouth every 7 (seven) days. Take with a full glass of water on an empty stomach. 4 tablet 3   buPROPion ER (WELLBUTRIN SR) 100 MG 12 hr tablet Take 1 tablet (100 mg total) by mouth in the morning. 90 tablet 4   calcium carbonate (OS-CAL) 600 MG TABS tablet Take 600 mg by mouth daily with breakfast.     Cholecalciferol (VITAMIN D3) 1000 units CAPS Take 3 capsules (3,000 Units total) by mouth daily.     ferrous sulfate 325 (65 FE) MG tablet Take 1 tablet (325 mg total) by mouth every Monday, Wednesday, and Friday.     Magnesium 250 MG TABS Take 1 tablet (250 mg total) by mouth daily.  0   naproxen (NAPROSYN) 500 MG tablet Take 1 tablet (500 mg total) by mouth 2 (two) times daily with a meal. For 1 week then as needed 40 tablet 0   vitamin B-12 (CYANOCOBALAMIN) 1000 MCG tablet Take 1 tablet (1,000 mcg total) by mouth daily.     zinc gluconate 50 MG tablet Take 1 tablet (50 mg total) by mouth daily.     No current facility-administered medications for this visit.    OBJECTIVE: Vitals:   07/28/22 0852  BP: (!) 118/90  Pulse: 64  Resp: 18  Temp: (!) 95.4 F (35.2 C)  SpO2: 100%     Body mass index is 29.37 kg/m.    ECOG FS:0 - Asymptomatic  General: Well-developed, well-nourished, no acute distress. Eyes: Pink conjunctiva, anicteric sclera. HEENT: Normocephalic, moist mucous membranes. Lungs: No audible wheezing or coughing. Heart: Regular rate and rhythm. Abdomen: Soft, nontender, no obvious  distention. Musculoskeletal: No edema, cyanosis, or clubbing. Neuro: Alert, answering all questions appropriately. Cranial nerves grossly intact. Skin: No rashes or petechiae noted. Psych: Normal affect.  LAB RESULTS:  Lab Results  Component Value Date   NA 138 07/28/2022   K 5.3 (H) 07/28/2022   CL 106 07/28/2022   CO2 25 07/28/2022   GLUCOSE 92 07/28/2022   BUN 29 (H) 07/28/2022   CREATININE 0.72 07/28/2022   CALCIUM 9.3 07/28/2022   PROT 6.8 07/28/2022   ALBUMIN 4.2 07/28/2022   AST 19 07/28/2022   ALT 22 07/28/2022   ALKPHOS 54 07/28/2022   BILITOT 0.7 07/28/2022   GFRNONAA >60 07/28/2022   GFRAA >60  07/10/2013    Lab Results  Component Value Date   WBC 7.3 07/28/2022   NEUTROABS 4.4 07/28/2022   HGB 15.2 (H) 07/28/2022   HCT 46.3 (H) 07/28/2022   MCV 90.8 07/28/2022   PLT 236 07/28/2022     STUDIES: No results found.  ONCOLOGY HISTORY: Retroperitoneal lymph node biopsy on April 21, 2020 confirmed the diagnosis. PET scan on April 14, 2020 revealed a hypermetabolic right sided retroperitoneal nodal mass along with hypermetabolic mesenteric adenopathy.  Patient also noted to have bilateral axillary adenopathy.  Although patient was asymptomatic and active surveillance was offered, she elected to pursue treatment and received IV Rituxan weekly x4 completing on May 28, 2020.  Repeat PET scan from August 24, 2020 reviewed independently with significant treatment response, although likely residual disease.  Patient previously declined maintenance Rituxan.   ASSESSMENT: Recurrent stage III follicular lymphoma, grade 1/2.    PLAN:    Recurrent stage III follicular lymphoma, grade 1/2:  Repeat imaging on March 15, 2021 with progression of disease.  Patient subsequently reinitiated treatment and received weekly Rituxan x 4 between March 31, 2021 and April 21, 2021.  She then initiated maintenance Rituxan every 8 weeks on Jun 23, 2021.  Will continue treatment every 8 weeks  for up to 2 years. Can also consider Zevalin in the future if necessary.  Repeat CT scan on May 27, 2022 without obvious evidence of recurrent or progressive disease.  Enlarged retroperitoneal lymph node is unchanged in size.  Proceed with cycle 7 of maintenance Rituxan today.  Return to clinic in 8 weeks for further evaluation and consideration of cycle 7.  Repeat imaging in approximately October 2024. Hypermetabolic thyroid nodule: Previously, biopsy was negative for malignancy. Myalgias: Patient does not complain of this today.  Continue ibuprofen as needed. Depression: Significantly improved.  Continue current medications as prescribed. Hyperkalemia- K 5.3 today. Reduce NSAIDs as able. Currently taking scheduled d/t foot pain/inflammation.  I've asked her to come next week to recheck.   Disposition Rituximab today 1 week- lab only (bmp) 8 weeks- lab, Dr Orlie Dakin, rituximab- la  I spent a total of 30 minutes reviewing chart data, face-to-face evaluation with the patient, counseling and coordination of care as detailed above.  Patient expressed understanding and was in agreement with this plan. She also understands that She can call clinic at any time with any questions, concerns, or complaints.    Cancer Staging  Follicular lymphoma (HCC) Staging form: Hodgkin and Non-Hodgkin Lymphoma, AJCC 8th Edition - Clinical stage from 04/29/2020: Stage III (Follicular lymphoma) - Signed by Jeralyn Ruths, MD on 04/29/2020 Stage prefix: Initial diagnosis   Alinda Dooms, NP   07/28/2022

## 2022-07-28 NOTE — Patient Instructions (Signed)
Instrucciones al darle de alta: Discharge Instructions Gracias por elegir al [facility] de Cncer de [place] para brindarle atencin mdica de oncologa y Teacher, English as a foreign language.   Si usted tiene una cita de laboratorio con [employer] de [place], por favor vaya directamente al [employer] de Cncer y regstrese en el rea de Engineer, maintenance (IT).   Use ropa cmoda y [place] para tener fcil acceso a las vas del Portacath (acceso venoso de Set designer duracin) o la lnea PICC (catter central colocado por va perifrica).   Nos esforzamos por ofrecerle tiempo de calidad con su proveedor. Es posible que tenga que volver a programar su cita si llega tarde (15 minutos o ms).  El llegar tarde le afecta a usted y a otros pacientes cuyas citas son posteriores a Armed forces operational officer.  Adems, si usted falta a tres o ms citas sin avisar a la oficina, puede ser retirado(a) de la clnica a discrecin del proveedor.      Para las solicitudes de renovacin de recetas, pida a su farmacia que se ponga en contacto con nuestra oficina y deje que transcurran 72 horas para que se complete el proceso de las renovaciones.    Hoy usted recibi los siguientes agentes de quimioterapia e/o inmunoterapia Ruxience      Para ayudar a prevenir las nuseas y los vmitos despus de su tratamiento, le recomendamos que tome su medicamento para las nuseas segn las indicaciones.  LOS SNTOMAS QUE DEBEN COMUNICARSE INMEDIATAMENTE SE INDICAN A CONTINUACIN: *FIEBRE SUPERIOR A 100.4 F (38 C) O MS *ESCALOFROS O SUDORACIN *NUSEAS Y VMITOS QUE NO SE CONTROLAN CON EL MEDICAMENTO PARA LAS NUSEAS *DIFICULTAD INUSUAL PARA RESPIRAR  *MORETONES O HEMORRAGIAS NO HABITUALES *PROBLEMAS URINARIOS (dolor o ardor al Geographical information systems officer o frecuencia para Geographical information systems officer) *PROBLEMAS INTESTINALES (diarrea inusual, estreimiento, dolor cerca del ano) SENSIBILIDAD EN LA BOCA Y EN LA GARGANTA CON O SIN LA PRESENCIA DE LCERAS (dolor de garganta, llagas en la boca o dolor de muelas/dientes) ERUPCIN,  HINCHAZN O DOLORES INUSUALES FLUJO VAGINAL INUSUAL O PICAZN/RASQUIA    Los puntos marcados con un asterisco ( *) indican una posible emergencia y debe hacer un seguimiento tan pronto como le sea posible o vaya al Departamento de Emergencias si se le presenta algn problema.  Por favor, muestre la [place] DE ADVERTENCIA DE Marc Morgans DE ADVERTENCIA DE Gardiner Fanti al registrarse en [address] de Emergencias y a la enfermera de triaje.  Si tiene preguntas despus de su visita o necesita cancelar o volver a programar su cita, por favor pngase en contacto con [facility]  [phone]  y siga las instrucciones. Las horas de oficina son de 8:00 a.m. a 4:30 p.m. de lunes a viernes. Por favor, tenga en cuenta que los mensajes de voz que se dejan despus de las 4:00 p.m. posiblemente no se devolvern hasta el siguiente da de [employer].  Cerramos los fines de semana y [employer]. En todo momento tiene acceso a una enfermera para preguntas urgentes. Por favor, llame al nmero principal de la clnica  [phone] y siga las instrucciones.   Para cualquier pregunta que no sea de carcter urgente, tambin puede ponerse en contacto con su proveedor [employer]. Ahora ofrecemos visitas electrnicas para cualquier persona mayor de 18 aos que solicite atencin mdica en lnea para los sntomas que no sean urgentes. Para ms detalles vaya a [email or web address].   Tambin puede bajar la aplicacin de MyChart! Vaya a la tienda de aplicaciones, busque "MyChart", abra la aplicacin,  seleccione [place], e ingrese con su nombre de usuario y la contrasea de MyChart.   

## 2022-07-28 NOTE — Progress Notes (Signed)
No concerns for the provider. 

## 2022-08-20 ENCOUNTER — Encounter: Payer: Self-pay | Admitting: Oncology

## 2022-08-22 ENCOUNTER — Encounter: Payer: Self-pay | Admitting: Family Medicine

## 2022-08-24 ENCOUNTER — Other Ambulatory Visit: Payer: Self-pay | Admitting: *Deleted

## 2022-08-24 DIAGNOSIS — E875 Hyperkalemia: Secondary | ICD-10-CM

## 2022-08-25 ENCOUNTER — Inpatient Hospital Stay: Payer: BC Managed Care – PPO | Attending: Oncology

## 2022-08-25 DIAGNOSIS — C8218 Follicular lymphoma grade II, lymph nodes of multiple sites: Secondary | ICD-10-CM | POA: Diagnosis not present

## 2022-08-25 DIAGNOSIS — E875 Hyperkalemia: Secondary | ICD-10-CM

## 2022-08-25 LAB — POTASSIUM: Potassium: 4.2 mmol/L (ref 3.5–5.1)

## 2022-09-05 ENCOUNTER — Ambulatory Visit: Payer: BC Managed Care – PPO | Admitting: Family Medicine

## 2022-09-09 ENCOUNTER — Other Ambulatory Visit: Payer: Self-pay | Admitting: Family Medicine

## 2022-09-09 DIAGNOSIS — M810 Age-related osteoporosis without current pathological fracture: Secondary | ICD-10-CM

## 2022-09-15 ENCOUNTER — Encounter (INDEPENDENT_AMBULATORY_CARE_PROVIDER_SITE_OTHER): Payer: Self-pay

## 2022-09-20 ENCOUNTER — Other Ambulatory Visit: Payer: Self-pay

## 2022-09-20 DIAGNOSIS — C8218 Follicular lymphoma grade II, lymph nodes of multiple sites: Secondary | ICD-10-CM

## 2022-09-20 NOTE — Progress Notes (Unsigned)
    Melinda Zuercher T. Reford Olliff, MD, CAQ Sports Medicine [facility] [address] [place], [address]  Phone: [phone]  FAX: [phone]  Melinda Hall - 62 y.o. female  MRN [id number]  Date of Birth: 1960-09-06  Date: 09/21/2022  PCP: Eustaquio Boyden, MD  Referral: Eustaquio Boyden, MD  No chief complaint on file.  Subjective:   Melinda Hall is a 62 y.o. very pleasant female patient with There is no height or weight on file to calculate BMI. who presents with the following:  Patient is a very pleasant patient, new to me today referred courtesy of Dr. Sharen Hones for evaluation of some right-sided posterior heel pain, probable Achilles tendinopathy.    Review of Systems is noted in the HPI, as appropriate  Objective:   There were no vitals taken for this visit.  GEN: No acute distress; alert,appropriate. PULM: Breathing comfortably in no respiratory distress PSYCH: Normally interactive.   Laboratory and Imaging Data:  Assessment and Plan:   ***

## 2022-09-21 ENCOUNTER — Inpatient Hospital Stay (HOSPITAL_BASED_OUTPATIENT_CLINIC_OR_DEPARTMENT_OTHER): Payer: BC Managed Care – PPO | Admitting: Oncology

## 2022-09-21 ENCOUNTER — Encounter: Payer: Self-pay | Admitting: Oncology

## 2022-09-21 ENCOUNTER — Inpatient Hospital Stay: Payer: BC Managed Care – PPO | Attending: Oncology

## 2022-09-21 ENCOUNTER — Inpatient Hospital Stay: Payer: BC Managed Care – PPO

## 2022-09-21 ENCOUNTER — Ambulatory Visit: Payer: BC Managed Care – PPO | Admitting: Oncology

## 2022-09-21 ENCOUNTER — Ambulatory Visit: Payer: BC Managed Care – PPO

## 2022-09-21 ENCOUNTER — Other Ambulatory Visit: Payer: BC Managed Care – PPO

## 2022-09-21 ENCOUNTER — Ambulatory Visit: Payer: BC Managed Care – PPO | Admitting: Family Medicine

## 2022-09-21 ENCOUNTER — Encounter: Payer: Self-pay | Admitting: Family Medicine

## 2022-09-21 VITALS — BP 118/80 | HR 68 | Temp 98.5°F | Ht 63.0 in | Wt 165.5 lb

## 2022-09-21 VITALS — BP 123/57 | HR 57 | Temp 98.1°F | Resp 19

## 2022-09-21 DIAGNOSIS — M7672 Peroneal tendinitis, left leg: Secondary | ICD-10-CM | POA: Diagnosis not present

## 2022-09-21 DIAGNOSIS — Z87891 Personal history of nicotine dependence: Secondary | ICD-10-CM | POA: Insufficient documentation

## 2022-09-21 DIAGNOSIS — C8218 Follicular lymphoma grade II, lymph nodes of multiple sites: Secondary | ICD-10-CM

## 2022-09-21 DIAGNOSIS — C8213 Follicular lymphoma grade II, intra-abdominal lymph nodes: Secondary | ICD-10-CM | POA: Diagnosis not present

## 2022-09-21 DIAGNOSIS — Z5112 Encounter for antineoplastic immunotherapy: Secondary | ICD-10-CM | POA: Diagnosis not present

## 2022-09-21 DIAGNOSIS — M7661 Achilles tendinitis, right leg: Secondary | ICD-10-CM | POA: Diagnosis not present

## 2022-09-21 LAB — CMP (CANCER CENTER ONLY)
ALT: 19 U/L (ref 0–44)
AST: 19 U/L (ref 15–41)
Albumin: 4.1 g/dL (ref 3.5–5.0)
Alkaline Phosphatase: 51 U/L (ref 38–126)
Anion gap: 6 (ref 5–15)
BUN: 14 mg/dL (ref 8–23)
CO2: 27 mmol/L (ref 22–32)
Calcium: 9.3 mg/dL (ref 8.9–10.3)
Chloride: 105 mmol/L (ref 98–111)
Creatinine: 0.68 mg/dL (ref 0.44–1.00)
GFR, Estimated: >60 mL/min (ref >60)
Glucose, Bld: 104 mg/dL — ABNORMAL HIGH (ref 70–99)
Potassium: 4.8 mmol/L (ref 3.5–5.1)
Sodium: 138 mmol/L (ref 135–145)
Total Bilirubin: 0.6 mg/dL (ref 0.3–1.2)
Total Protein: 6.7 g/dL (ref 6.5–8.1)

## 2022-09-21 LAB — CBC WITH DIFFERENTIAL/PLATELET
Abs Immature Granulocytes: 0.02 10*3/uL (ref 0.00–0.07)
Basophils Absolute: 0.1 10*3/uL (ref 0.0–0.1)
Basophils Relative: 1 %
Eosinophils Absolute: 0.1 10*3/uL (ref 0.0–0.5)
Eosinophils Relative: 1 %
HCT: 47.7 % — ABNORMAL HIGH (ref 36.0–46.0)
Hemoglobin: 15.4 g/dL — ABNORMAL HIGH (ref 12.0–15.0)
Immature Granulocytes: 0 %
Lymphocytes Relative: 31 %
Lymphs Abs: 2.3 10*3/uL (ref 0.7–4.0)
MCH: 30 pg (ref 26.0–34.0)
MCHC: 32.3 g/dL (ref 30.0–36.0)
MCV: 92.8 fL (ref 80.0–100.0)
Monocytes Absolute: 0.8 10*3/uL (ref 0.1–1.0)
Monocytes Relative: 11 %
Neutro Abs: 4.2 10*3/uL (ref 1.7–7.7)
Neutrophils Relative %: 56 %
Platelets: 247 10*3/uL (ref 150–400)
RBC: 5.14 MIL/uL — ABNORMAL HIGH (ref 3.87–5.11)
RDW: 13.1 % (ref 11.5–15.5)
WBC: 7.5 10*3/uL (ref 4.0–10.5)
nRBC: 0 % (ref 0.0–0.2)

## 2022-09-21 MED ORDER — SODIUM CHLORIDE 0.9 % IV SOLN
Freq: Once | INTRAVENOUS | Status: AC
  Filled 2022-09-21: qty 250

## 2022-09-21 MED ORDER — NITROGLYCERIN 0.2 MG/HR TD PT24: MEDICATED_PATCH | TRANSDERMAL | 2 refills | Status: DC

## 2022-09-21 MED ORDER — DIPHENHYDRAMINE HCL 25 MG PO CAPS
50.0000 mg | ORAL_CAPSULE | Freq: Once | ORAL | Status: AC
  Administered 2022-09-21: 50 mg via ORAL
  Filled 2022-09-21: qty 2

## 2022-09-21 MED ORDER — SODIUM CHLORIDE 0.9 % IV SOLN
375.0000 mg/m2 | Freq: Once | INTRAVENOUS | Status: AC
  Administered 2022-09-21: 700 mg via INTRAVENOUS
  Filled 2022-09-21: qty 50

## 2022-09-21 MED ORDER — PREDNISONE 20 MG PO TABS: ORAL_TABLET | ORAL | 0 refills | Status: DC

## 2022-09-21 MED ORDER — ACETAMINOPHEN 325 MG PO TABS
650.0000 mg | ORAL_TABLET | Freq: Once | ORAL | Status: AC
  Administered 2022-09-21: 650 mg via ORAL
  Filled 2022-09-21: qty 2

## 2022-09-21 NOTE — Progress Notes (Signed)
Having concerns about nightly night sweating. It used to be every once in a while at night put has become more consistent.

## 2022-09-21 NOTE — Progress Notes (Signed)
[facility]  Telephone:([phone] Fax:([phone]  ID: Melinda Hall OB: Aug 28, 1960  MR#: [id number]  TKZ#:[id number]  Patient Care Team: Eustaquio Boyden, MD as PCP - General (Family Medicine) Jeralyn Ruths, MD as Consulting Physician (Oncology) Lucy Chris, MD as Consulting Physician (Neurosurgery)  CHIEF COMPLAINT: Recurrent stage III follicular lymphoma, grade 1/2.    INTERVAL HISTORY: Patient returns to clinic today for further evaluation and continuation of maintenance Rituxan.  She has noticed an increased in night sweats, but otherwise feels well.  She is tolerating her treatments without significant side effects.  She denies any recent fevers or illnesses.  She has a good appetite and denies weight loss.  She has no neurologic complaints.  She does not complain of any weakness or fatigue today. She has no chest pain, shortness of breath, cough, or hemoptysis.  She denies any nausea, vomiting, constipation, or diarrhea.  She has no urinary complaints.  Patient has no further specific complaints today.  REVIEW OF SYSTEMS:   Review of Systems  Constitutional:  Positive for diaphoresis. Negative for fever, malaise/fatigue and weight loss.  Respiratory: Negative.  Negative for cough, hemoptysis and shortness of breath.   Cardiovascular: Negative.  Negative for chest pain and leg swelling.  Gastrointestinal: Negative.  Negative for abdominal pain.  Genitourinary: Negative.  Negative for dysuria.  Musculoskeletal: Negative.  Negative for back pain and myalgias.  Skin: Negative.  Negative for rash.  Neurological:  Positive for sensory change. Negative for tingling, focal weakness, weakness and headaches.  Psychiatric/Behavioral: Negative.  Negative for depression. The patient is not nervous/anxious.     As per HPI. Otherwise, a complete review of systems is negative.  PAST MEDICAL HISTORY: Past Medical History:  Diagnosis Date   Cancer (HCC)     Dermoid tumor    hx of--left ovary   Follicular lymphoma (HCC)    Gastric bypass status for obesity 2004   s/p roux-en-y   Generalized headaches    frequent   Heart murmur    History of cardiac murmur    Intraductal papilloma of breast, left 06/18/2017   05/2017 - s/p excision 07/2017   Osteoporosis 03/2016   DEXA -2.6 spine   Salivary gland stone    x 2   Smoker     PAST SURGICAL HISTORY: Past Surgical History:  Procedure Laterality Date   ABDOMINAL HYSTERECTOMY     only ovary was removed   ANTERIOR CERVICAL DECOMP/DISCECTOMY FUSION  12/2016   Pool   APPENDECTOMY  1980   BILATERAL OOPHORECTOMY  ~1992   for dermoid ovarian cysts   BREAST BIOPSY Left 06/13/2017   papilloma with excision 07/2017   BREAST LUMPECTOMY WITH NEEDLE LOCALIZATION Left 08/10/2017   intraductal papilloma - Sterling Big F, MD   CHOLECYSTECTOMY  2000s   COLONOSCOPY WITH PROPOFOL N/A 11/27/2015   TA rpt 5 yrs Midge Minium, MD)   COLONOSCOPY WITH PROPOFOL N/A 05/17/2021   TAs, SSPs, rpt 3 yrs Servando Snare, Darren, MD)   GASTRIC BYPASS  2004   roux en y [facility])   POLYPECTOMY  11/27/2015   Procedure: POLYPECTOMY;  Surgeon: Midge Minium, MD;  Location: [facility];  Service: Endoscopy;;   POLYPECTOMY  05/17/2021   Procedure: POLYPECTOMY;  Surgeon: Midge Minium, MD;  [facility];  Service: Endoscopy;;   REDUCTION MAMMAPLASTY Bilateral 1990's   SALIVARY GLAND SURGERY     SALIVARY STONE REMOVAL      FAMILY HISTORY: Family History  Problem Relation Age of Onset  Cancer Father 42       leukemia   Hypertension Mother    Diabetes Neg Hx    Stroke Neg Hx    Coronary artery disease Neg Hx    Breast cancer Neg Hx     ADVANCED DIRECTIVES (Y/N):  N  HEALTH MAINTENANCE: Social History   Tobacco Use   Smoking status: Former    Current packs/day: 0.00    Average packs/day: 0.3 packs/day for 20.0 years (5.0 ttl pk-yrs)    Types: Cigarettes    Start date: 10/31/1992    Quit date:  10/31/2012    Years since quitting: 9.8   Smokeless tobacco: Never  Vaping Use   Vaping status: Never Used  Substance Use Topics   Alcohol use: Not Currently   Drug use: No     Colonoscopy:  PAP:  Bone density:  Lipid panel:  Allergies  Allergen Reactions   Boniva [Ibandronic Acid] Other (See Comments)    Diffuse body aches    Current Outpatient Medications  Medication Sig Dispense Refill   alendronate (FOSAMAX) 70 MG tablet TAKE 1 TABLET (70 MG TOTAL) BY MOUTH EVERY 7 DAYS WITH FULL GLASS WATER ON EMPTY STOMACH 4 tablet 0   buPROPion ER (WELLBUTRIN SR) 100 MG 12 hr tablet Take 1 tablet (100 mg total) by mouth in the morning. 90 tablet 4   calcium carbonate (OS-CAL) 600 MG TABS tablet Take 600 mg by mouth daily with breakfast.     Cholecalciferol (VITAMIN D3) 1000 units CAPS Take 3 capsules (3,000 Units total) by mouth daily.     ferrous sulfate 325 (65 FE) MG tablet Take 1 tablet (325 mg total) by mouth every Monday, Wednesday, and Friday.     Magnesium 250 MG TABS Take 1 tablet (250 mg total) by mouth daily.  0   naproxen (NAPROSYN) 500 MG tablet Take 1 tablet (500 mg total) by mouth 2 (two) times daily with a meal. For 1 week then as needed 40 tablet 0   vitamin B-12 (CYANOCOBALAMIN) 1000 MCG tablet Take 1 tablet (1,000 mcg total) by mouth daily.     zinc gluconate 50 MG tablet Take 1 tablet (50 mg total) by mouth daily.     nitroGLYCERIN (NITRODUR - DOSED IN MG/24 HR) 0.2 mg/hr patch Apply 1/4 patch to affected area as directed by MD and change every 24 hours. 30 patch 2   predniSONE (DELTASONE) 20 MG tablet 2 tabs po daily for 5 days, then 1 tab po daily for 5 days 15 tablet 0   No current facility-administered medications for this visit.    OBJECTIVE: Vitals:   09/21/22 0839  BP: (!) 104/90  Pulse: 67  Resp: 16  Temp: (!) 97.5 F (36.4 C)  SpO2: 100%     Body mass index is 28.91 kg/m.    ECOG FS:0 - Asymptomatic  General: Well-developed, well-nourished, no acute  distress. Eyes: Pink conjunctiva, anicteric sclera. HEENT: Normocephalic, moist mucous membranes. Lungs: No audible wheezing or coughing. Heart: Regular rate and rhythm. Abdomen: Soft, nontender, no obvious distention. Musculoskeletal: No edema, cyanosis, or clubbing. Neuro: Alert, answering all questions appropriately. Cranial nerves grossly intact. Skin: No rashes or petechiae noted. Psych: Normal affect.  LAB RESULTS:  Lab Results  Component Value Date   NA 138 09/21/2022   K 4.8 09/21/2022   CL 105 09/21/2022   CO2 27 09/21/2022   GLUCOSE 104 (H) 09/21/2022   BUN 14 09/21/2022   CREATININE 0.68 09/21/2022   CALCIUM 9.3  09/21/2022   PROT 6.7 09/21/2022   ALBUMIN 4.1 09/21/2022   AST 19 09/21/2022   ALT 19 09/21/2022   ALKPHOS 51 09/21/2022   BILITOT 0.6 09/21/2022   GFRNONAA >60 09/21/2022   GFRAA >60 07/10/2013    Lab Results  Component Value Date   WBC 7.5 09/21/2022   NEUTROABS 4.2 09/21/2022   HGB 15.4 (H) 09/21/2022   HCT 47.7 (H) 09/21/2022   MCV 92.8 09/21/2022   PLT 247 09/21/2022     STUDIES: No results found.  ONCOLOGY HISTORY: Retroperitoneal lymph node biopsy on April 21, 2020 confirmed the diagnosis. PET scan on April 14, 2020 revealed a hypermetabolic right sided retroperitoneal nodal mass along with hypermetabolic mesenteric adenopathy.  Patient also noted to have bilateral axillary adenopathy.  Although patient was asymptomatic and active surveillance was offered, she elected to pursue treatment and received IV Rituxan weekly x4 completing on May 28, 2020.  Repeat PET scan from August 24, 2020 reviewed independently with significant treatment response, although likely residual disease.  Patient previously declined maintenance Rituxan.   ASSESSMENT: Recurrent stage III follicular lymphoma, grade 1/2.    PLAN:    Recurrent stage III follicular lymphoma, grade 1/2:  Repeat imaging on March 15, 2021 reviewed independently with progression of  disease.  Patient subsequently reinitiated treatment and received weekly Rituxan x 4 between March 31, 2021 and April 21, 2021.  She then initiated maintenance Rituxan every 8 weeks on Jun 23, 2021.  Will continue treatment every 8 weeks for up to 2 years. Repeat CT scan on May 27, 2022 reviewed independently with no obvious evidence of recurrent or progressive disease.  Enlarged retroperitoneal lymph node is unchanged in size.  Proceed with cycle 7 of maintenance Rituxan today.  Return to clinic in 8 weeks for further evaluation and consideration of cycle 8.  Will repeat imaging prior to next treatment.  Hypermetabolic thyroid nodule: Previously, biopsy was negative for malignancy. Depression: Significantly improved.  Continue current medications as prescribed. Hot flashes: Patient reports they are slightly worse but did not affect her day-to-day activity.  Repeat imaging as above.  Patient expressed understanding and was in agreement with this plan. She also understands that She can call clinic at any time with any questions, concerns, or complaints.    Cancer Staging  Follicular lymphoma (HCC) Staging form: Hodgkin and Non-Hodgkin Lymphoma, AJCC 8th Edition - Clinical stage from 04/29/2020: Stage III (Follicular lymphoma) - Signed by Jeralyn Ruths, MD on 04/29/2020 Stage prefix: Initial diagnosis   Jeralyn Ruths, MD   09/21/2022 4:47 PM

## 2022-09-21 NOTE — Patient Instructions (Signed)
Instrucciones al darle de alta: Discharge Instructions Gracias por elegir al [facility] de Cncer de [place] para brindarle atencin mdica de oncologa y Teacher, English as a foreign language.   Si usted tiene una cita de laboratorio con [employer] de [place], por favor vaya directamente al [employer] de Cncer y regstrese en el rea de Engineer, maintenance (IT).   Use ropa cmoda y [place] para tener fcil acceso a las vas del Portacath (acceso venoso de Set designer duracin) o la lnea PICC (catter central colocado por va perifrica).   Nos esforzamos por ofrecerle tiempo de calidad con su proveedor. Es posible que tenga que volver a programar su cita si llega tarde (15 minutos o ms).  El llegar tarde le afecta a usted y a otros pacientes cuyas citas son posteriores a Armed forces operational officer.  Adems, si usted falta a tres o ms citas sin avisar a la oficina, puede ser retirado(a) de la clnica a discrecin del proveedor.      Para las solicitudes de renovacin de recetas, pida a su farmacia que se ponga en contacto con nuestra oficina y deje que transcurran 72 horas para que se complete el proceso de las renovaciones.    Hoy usted recibi los siguientes agentes de quimioterapia e/o inmunoterapia rituximab      Para ayudar a prevenir las nuseas y los vmitos despus de su tratamiento, le recomendamos que tome su medicamento para las nuseas segn las indicaciones.  LOS SNTOMAS QUE DEBEN COMUNICARSE INMEDIATAMENTE SE INDICAN A CONTINUACIN: *FIEBRE SUPERIOR A 100.4 F (38 C) O MS *ESCALOFROS O SUDORACIN *NUSEAS Y VMITOS QUE NO SE CONTROLAN CON EL MEDICAMENTO PARA LAS NUSEAS *DIFICULTAD INUSUAL PARA RESPIRAR  *MORETONES O HEMORRAGIAS NO HABITUALES *PROBLEMAS URINARIOS (dolor o ardor al Geographical information systems officer o frecuencia para Geographical information systems officer) *PROBLEMAS INTESTINALES (diarrea inusual, estreimiento, dolor cerca del ano) SENSIBILIDAD EN LA BOCA Y EN LA GARGANTA CON O SIN LA PRESENCIA DE LCERAS (dolor de garganta, llagas en la boca o dolor de muelas/dientes) ERUPCIN,  HINCHAZN O DOLORES INUSUALES FLUJO VAGINAL INUSUAL O PICAZN/RASQUIA    Los puntos marcados con un asterisco ( *) indican una posible emergencia y debe hacer un seguimiento tan pronto como le sea posible o vaya al Departamento de Emergencias si se le presenta algn problema.  Por favor, muestre la [place] DE ADVERTENCIA DE Marc Morgans DE ADVERTENCIA DE Gardiner Fanti al registrarse en [address] de Emergencias y a la enfermera de triaje.  Si tiene preguntas despus de su visita o necesita cancelar o volver a programar su cita, por favor pngase en contacto con [facility]  [phone]  y siga las instrucciones. Las horas de oficina son de 8:00 a.m. a 4:30 p.m. de lunes a viernes. Por favor, tenga en cuenta que los mensajes de voz que se dejan despus de las 4:00 p.m. posiblemente no se devolvern hasta el siguiente da de [place].  Cerramos los fines de semana y [employer]. En todo momento tiene acceso a una enfermera para preguntas urgentes. Por favor, llame al nmero principal de la clnica  [phone] y siga las instrucciones.   Para cualquier pregunta que no sea de carcter urgente, tambin puede ponerse en contacto con su proveedor [employer]. Ahora ofrecemos visitas electrnicas para cualquier persona mayor de 18 aos que solicite atencin mdica en lnea para los sntomas que no sean urgentes. Para ms detalles vaya a [email or web address].   Tambin puede bajar la aplicacin de MyChart! Vaya a la tienda de aplicaciones, busque "MyChart", abra la aplicacin,  seleccione [place], e ingrese con su nombre de usuario y la contrasea de Clinical cytogeneticist.

## 2022-09-21 NOTE — Patient Instructions (Addendum)
Rehabilitacin del tendn de Aquiles  Empiece fcil.  Est bien si hay una molestia leve, pero si hay dolor, reduzca la cantidad de rehabilitacin que est haciendo.  Para la rehabilitacin del tendn de Aquiles, bsicamente solo necesitas concentrarte en el tobillo que sube y baja.  Inicio: Elevacin de pantorrilla mientras est sentado Primero baje y luego suba con ambos pies. Ayude a levantar con las manos y luego baje lentamente  Comienza con 3 series de 10 repeticiones. Aumentar en 5 repeticiones cada 3 das.  El objetivo son 3 series de 30 repeticiones.  Si se siente bien con 3 series de 30, agregue una mochila con 5 libras  Aumente 5 libras por semana hasta un mximo de 30 libras   Alternativa: Si tienes pesas en casa, puedes poner una mancuerna en posicin vertical sobre la rodilla del taln afectado.  Sube con ayuda de tus manos y luego baja lentamente.   Si esto es fcil, entonces cambie a pantorrillas de pie sobre un escaln.  Lawerance Sabal de cualquier marca elevar el taln y aliviar la tensin del tendn de Aquiles.  Me gusta especialmente la marca llamada taloneras Tuli.  Tambin cubren la parte posterior del taln.  Pueden ser difciles de Clinical research associate a menos que puedas realizar pedidos desde la computadora, como en [employer].  Cualquier talonera est bien, incluidas las que puedes Personal assistant.

## 2022-09-22 ENCOUNTER — Other Ambulatory Visit: Payer: Self-pay

## 2022-09-26 ENCOUNTER — Other Ambulatory Visit: Payer: Self-pay

## 2022-10-04 ENCOUNTER — Encounter: Payer: Self-pay | Admitting: Family Medicine

## 2022-10-04 ENCOUNTER — Ambulatory Visit (INDEPENDENT_AMBULATORY_CARE_PROVIDER_SITE_OTHER): Payer: Medicare HMO | Admitting: Family Medicine

## 2022-10-04 ENCOUNTER — Encounter: Payer: Self-pay | Admitting: Oncology

## 2022-10-04 VITALS — BP 122/84 | HR 64 | Temp 97.4°F | Ht 63.0 in | Wt 165.1 lb

## 2022-10-04 DIAGNOSIS — F4323 Adjustment disorder with mixed anxiety and depressed mood: Secondary | ICD-10-CM | POA: Diagnosis not present

## 2022-10-04 DIAGNOSIS — F4321 Adjustment disorder with depressed mood: Secondary | ICD-10-CM | POA: Diagnosis not present

## 2022-10-04 DIAGNOSIS — M7661 Achilles tendinitis, right leg: Secondary | ICD-10-CM

## 2022-10-04 DIAGNOSIS — F5104 Psychophysiologic insomnia: Secondary | ICD-10-CM

## 2022-10-04 DIAGNOSIS — Z23 Encounter for immunization: Secondary | ICD-10-CM

## 2022-10-04 DIAGNOSIS — C8220 Follicular lymphoma grade III, unspecified, unspecified site: Secondary | ICD-10-CM

## 2022-10-04 DIAGNOSIS — G4700 Insomnia, unspecified: Secondary | ICD-10-CM | POA: Insufficient documentation

## 2022-10-04 MED ORDER — HYDROXYZINE HCL 25 MG PO TABS: 12.5000 mg | ORAL_TABLET | Freq: Three times a day (TID) | ORAL | 0 refills | Status: DC | PRN

## 2022-10-04 NOTE — Progress Notes (Signed)
Ph: [phone] Fax: [phone]   Patient ID: Melinda Hall, female    DOB: [birth year], 62 y.o.   MRN: [id number]  This visit was conducted in person.  BP 122/84   Pulse 64   Temp (!) 97.4 F (36.3 C) (Temporal)   Ht 5\' 3"  (1.6 m)   Wt 165 lb 2 oz (74.9 kg)   SpO2 98%   BMI 29.25 kg/m    CC: 4 mo f/u visit  Subjective:   HPI: Melinda Hall is a 61 y.o. female presenting on 10/04/2022 for Medical Management of Chronic Issues (Here for 4 mo f/u.)   Recent stressful trip to [place] to see sick brother with kidney cancer. She just restarted wellbutrin SR 100mg  daily.  Notes worsening difficulty with sleep maintenance insomnia. She is taking melatonin at night time. Can average 5 hours of sleep. No daytime naps.   Recurrent stage III follicular lymphoma followed by oncology. Getting infusions q2 months since 05/2021 planned 2 yr treatment. She notes recurring hot flashes. Pending f/u CT scan 10/21/2022.   Recently seen by sports medicine for left achilles tendonitis > peroneal tendonitis. Recommended nitroglycerine protocol with eccentric stretching exercises, as well as prednisone course. Placed in heel lift.   She notes worsening left lateral foot numbness in h/o lumbar herniated disc (lumbar MRI 2018 with possible left L5 nerve root compression).      Relevant past medical, surgical, family and social history reviewed and updated as indicated. Interim medical history since our last visit reviewed. Allergies and medications reviewed and updated. Outpatient Medications Prior to Visit  Medication Sig Dispense Refill   alendronate (FOSAMAX) 70 MG tablet TAKE 1 TABLET (70 MG TOTAL) BY MOUTH EVERY 7 DAYS WITH FULL GLASS WATER ON EMPTY STOMACH 4 tablet 0   buPROPion ER (WELLBUTRIN SR) 100 MG 12 hr tablet Take 1 tablet (100 mg total) by mouth in the morning. 90 tablet 4   calcium carbonate (OS-CAL) 600 MG TABS tablet Take 600 mg by mouth daily with breakfast.     Cholecalciferol  (VITAMIN D3) 1000 units CAPS Take 3 capsules (3,000 Units total) by mouth daily.     ferrous sulfate 325 (65 FE) MG tablet Take 1 tablet (325 mg total) by mouth every Monday, Wednesday, and Friday.     Magnesium 250 MG TABS Take 1 tablet (250 mg total) by mouth daily.  0   naproxen (NAPROSYN) 500 MG tablet Take 1 tablet (500 mg total) by mouth 2 (two) times daily with a meal. For 1 week then as needed 40 tablet 0   nitroGLYCERIN (NITRODUR - DOSED IN MG/24 HR) 0.2 mg/hr patch Apply 1/4 patch to affected area as directed by MD and change every 24 hours. 30 patch 2   predniSONE (DELTASONE) 20 MG tablet 2 tabs po daily for 5 days, then 1 tab po daily for 5 days 15 tablet 0   vitamin B-12 (CYANOCOBALAMIN) 1000 MCG tablet Take 1 tablet (1,000 mcg total) by mouth daily.     zinc gluconate 50 MG tablet Take 1 tablet (50 mg total) by mouth daily.     No facility-administered medications prior to visit.     Per HPI unless specifically indicated in ROS section below Review of Systems  Objective:  BP 122/84   Pulse 64   Temp (!) 97.4 F (36.3 C) (Temporal)   Ht 5\' 3"  (1.6 m)   Wt 165 lb 2 oz (74.9 kg)   SpO2 98%   BMI 29.25 kg/m  Wt Readings from Last 3 Encounters:  10/04/22 165 lb 2 oz (74.9 kg)  09/21/22 165 lb 8 oz (75.1 kg)  09/21/22 163 lb 3.2 oz (74 kg)      Physical Exam Vitals and nursing note reviewed.  Constitutional:      Appearance: Normal appearance. She is not ill-appearing.  HENT:     Head: Normocephalic and atraumatic.     Mouth/Throat:     Mouth: Mucous membranes are moist.     Pharynx: Oropharynx is clear. No oropharyngeal exudate or posterior oropharyngeal erythema.  Eyes:     Extraocular Movements: Extraocular movements intact.     Conjunctiva/sclera: Conjunctivae normal.     Pupils: Pupils are equal, round, and reactive to light.  Cardiovascular:     Rate and Rhythm: Normal rate and regular rhythm.     Pulses: Normal pulses.     Heart sounds: Normal heart  sounds. No murmur heard. Pulmonary:     Effort: Pulmonary effort is normal. No respiratory distress.     Breath sounds: Normal breath sounds. No wheezing, rhonchi or rales.  Musculoskeletal:     Cervical back: Normal range of motion and neck supple.     Right lower leg: No edema.     Left lower leg: No edema.  Skin:    General: Skin is warm and dry.     Findings: No rash.  Neurological:     Mental Status: She is alert.  Psychiatric:        Mood and Affect: Mood normal.        Behavior: Behavior normal.        Assessment & Plan:   Problem List Items Addressed This Visit     Follicular lymphoma (HCC) - Primary (Chronic)    Appreciate oncology care - continues regular maintenance rituximab infusions wit planned surveillance imaging later this month      Adjustment disorder with mixed anxiety and depressed mood    Reviewed recent family stressors, support provided. Continue wellbutrin SR 100mg  daily.  Discussed insomnia as per above.       Situational depression    See above.       Relevant Medications   hydrOXYzine (ATARAX) 25 MG tablet   Tendonitis, Achilles, right    Appreciate sports medicine care. Continue current treatment plan.       Insomnia    Sleep maintenance insomnia, worse with recent stressors, present since prior to restarting wellbutrin.  Reviewed sleep hygiene.  Ok to continue melatonin. Will also trial PRN hydroxyzine       Other Visit Diagnoses     Need for influenza vaccination       Relevant Orders   Flu vaccine trivalent PF, 6mos and older(Flulaval,Afluria,Fluarix,Fluzone) (Completed)        Meds ordered this encounter  Medications   hydrOXYzine (ATARAX) 25 MG tablet    Sig: Take 0.5-1 tablets (12.5-25 mg total) by mouth 3 (three) times daily as needed.    Dispense:  30 tablet    Refill:  0    Orders Placed This Encounter  Procedures   Flu vaccine trivalent PF, 6mos and older(Flulaval,Afluria,Fluarix,Fluzone)    Patient  Instructions  Flu shot today Puede tratar hydroxyzine 25mg  1/2-1 tableta por la noche para dormir.  Gusto verla hoy  Regresar en 5 meses para proximo examen fisico (despues de 03/08/2023)   Follow up plan: Return in about 5 months (around 03/08/2023).  Eustaquio Boyden, MD

## 2022-10-04 NOTE — Assessment & Plan Note (Signed)
Appreciate oncology care - continues regular maintenance rituximab infusions wit planned surveillance imaging later this month

## 2022-10-04 NOTE — Assessment & Plan Note (Addendum)
Sleep maintenance insomnia, worse with recent stressors, present since prior to restarting wellbutrin.  Reviewed sleep hygiene.  Ok to continue melatonin. Will also trial PRN hydroxyzine

## 2022-10-04 NOTE — Assessment & Plan Note (Addendum)
Reviewed recent family stressors, support provided. Continue wellbutrin SR 100mg  daily.  Discussed insomnia as per above.

## 2022-10-04 NOTE — Assessment & Plan Note (Signed)
See above

## 2022-10-04 NOTE — Patient Instructions (Addendum)
Flu shot today Puede tratar hydroxyzine 25mg  1/2-1 tableta por la noche para dormir.  Gusto verla hoy  Regresar en 5 meses para proximo examen fisico (despues de 03/08/2023)

## 2022-10-04 NOTE — Assessment & Plan Note (Signed)
Appreciate sports medicine care. Continue current treatment plan.

## 2022-10-05 ENCOUNTER — Other Ambulatory Visit: Payer: Self-pay | Admitting: Family Medicine

## 2022-10-05 ENCOUNTER — Other Ambulatory Visit: Payer: Self-pay

## 2022-10-05 DIAGNOSIS — M810 Age-related osteoporosis without current pathological fracture: Secondary | ICD-10-CM

## 2022-10-06 NOTE — Telephone Encounter (Signed)
Plz submit PA.

## 2022-10-12 ENCOUNTER — Encounter: Payer: Self-pay | Admitting: Family Medicine

## 2022-10-12 ENCOUNTER — Other Ambulatory Visit (HOSPITAL_COMMUNITY): Payer: Self-pay

## 2022-10-12 ENCOUNTER — Encounter: Payer: Self-pay | Admitting: Oncology

## 2022-10-12 NOTE — Telephone Encounter (Signed)
Please sign off on rx in this encounter as PA team is unable to resolve RX requests. Thank you

## 2022-10-12 NOTE — Telephone Encounter (Signed)
Per test claim, medication is covered, no PA submitted at this time.

## 2022-10-12 NOTE — Telephone Encounter (Signed)
Wasn't fully sure of pt's message, since it's in [place]. However, I recognized the phrase, "el medicamento de la osteoporosis" so I called pt asking about osteopetrosis med. Chart shows she is currently taking Fosamax wkly.   Pt confirms she is taking Fosamax wkly but was taking Prolia inj every 6 mos but had to stop due to insurance no longer covering. Says she now has Medicare and wants to go back to Prolia, if Medicare covers it. Plz advise.

## 2022-10-14 ENCOUNTER — Encounter: Payer: Self-pay | Admitting: Family Medicine

## 2022-10-14 NOTE — Telephone Encounter (Signed)
Pt has been on fosamax. She is interested in Prolia now with medicare advantage plan.  Can we price this out for pt?

## 2022-10-14 NOTE — Telephone Encounter (Signed)
Replied via other mychart message.

## 2022-10-19 ENCOUNTER — Telehealth: Payer: Self-pay

## 2022-10-19 ENCOUNTER — Ambulatory Visit (INDEPENDENT_AMBULATORY_CARE_PROVIDER_SITE_OTHER): Payer: Medicare HMO | Admitting: Family

## 2022-10-19 ENCOUNTER — Encounter: Payer: Self-pay | Admitting: Family

## 2022-10-19 ENCOUNTER — Other Ambulatory Visit (HOSPITAL_COMMUNITY): Payer: Self-pay

## 2022-10-19 VITALS — BP 130/72 | HR 80 | Temp 97.9°F | Ht 64.0 in | Wt 167.6 lb

## 2022-10-19 DIAGNOSIS — R300 Dysuria: Secondary | ICD-10-CM | POA: Insufficient documentation

## 2022-10-19 LAB — POCT URINALYSIS DIPSTICK
Bilirubin, UA: NEGATIVE
Glucose, UA: NEGATIVE
Ketones, UA: NEGATIVE
Nitrite, UA: NEGATIVE
Protein, UA: NEGATIVE
Spec Grav, UA: 1.015 (ref 1.010–1.025)
Urobilinogen, UA: 0.2 U/dL
pH, UA: 5.5 (ref 5.0–8.0)

## 2022-10-19 LAB — URINALYSIS, ROUTINE W REFLEX MICROSCOPIC
Bilirubin Urine: NEGATIVE
Ketones, ur: NEGATIVE
Nitrite: NEGATIVE
Specific Gravity, Urine: 1.015 (ref 1.000–1.030)
Total Protein, Urine: NEGATIVE
Urine Glucose: NEGATIVE
Urobilinogen, UA: 0.2 (ref 0.0–1.0)
pH: 6 (ref 5.0–8.0)

## 2022-10-19 MED ORDER — AMOXICILLIN-POT CLAVULANATE 875-125 MG PO TABS
1.0000 | ORAL_TABLET | Freq: Two times a day (BID) | ORAL | 0 refills | Status: AC
Start: 2022-10-19 — End: 2022-10-26

## 2022-10-19 NOTE — Telephone Encounter (Signed)
Prolia VOB initiated via [email or web address]

## 2022-10-19 NOTE — Progress Notes (Signed)
Assessment & Plan:  Dysuria Assessment & Plan: Afebrile.  Point-of-care urine moderate hematuria, leukocytes.  Negative nitrites.  I went ahead and sent in Augmentin advised patient to start antibiotic if symptoms were to worsen.  Otherwise patient and husband are inclined to wait on urine culture.  Urine culture, urinalysis pending at this time  Orders: -     Amoxicillin-Pot Clavulanate; Take 1 tablet by mouth 2 (two) times daily for 7 days.  Dispense: 14 tablet; Refill: 0 -     POCT urinalysis dipstick -     Urinalysis, Routine w reflex microscopic -     Urine Culture     Return precautions given.   Risks, benefits, and alternatives of the medications and treatment plan prescribed today were discussed, and patient expressed understanding.   Education regarding symptom management and diagnosis given to patient on AVS either electronically or printed.  No follow-ups on file.  Rennie Plowman, FNP  Subjective:    Patient ID: Melinda Hall, female    DOB: February 03, 1960, 62 y.o.   MRN: [id number]  CC: Melinda Hall is a 62 y.o. female who presents today for an acute visit.    HPI: Accompanied by husband Patient speaks spanish  Complains of dysuria x one day  Denies N, F, N  No recent antibiotics.    Leaves Sunday for [place]        History of follicular lymphoma; following with oncology.  Maintained on IV chemo.   No h/o ckd   Allergies: Boniva [ibandronic acid] Current Outpatient Medications on File Prior to Visit  Medication Sig Dispense Refill   alendronate (FOSAMAX) 70 MG tablet TAKE 1 TABLET (70 MG TOTAL) BY MOUTH EVERY 7 DAYS WITH FULL GLASS WATER ON EMPTY STOMACH 12 tablet 1   buPROPion ER (WELLBUTRIN SR) 100 MG 12 hr tablet Take 1 tablet (100 mg total) by mouth in the morning. 90 tablet 4   calcium carbonate (OS-CAL) 600 MG TABS tablet Take 600 mg by mouth daily with breakfast.     Cholecalciferol (VITAMIN D3) 1000 units CAPS Take 3 capsules (3,000 Units  total) by mouth daily.     ferrous sulfate 325 (65 FE) MG tablet Take 1 tablet (325 mg total) by mouth every Monday, Wednesday, and Friday.     hydrOXYzine (ATARAX) 25 MG tablet Take 0.5-1 tablets (12.5-25 mg total) by mouth 3 (three) times daily as needed. 30 tablet 0   Magnesium 250 MG TABS Take 1 tablet (250 mg total) by mouth daily.  0   naproxen (NAPROSYN) 500 MG tablet Take 1 tablet (500 mg total) by mouth 2 (two) times daily with a meal. For 1 week then as needed 40 tablet 0   nitroGLYCERIN (NITRODUR - DOSED IN MG/24 HR) 0.2 mg/hr patch Apply 1/4 patch to affected area as directed by MD and change every 24 hours. 30 patch 2   predniSONE (DELTASONE) 20 MG tablet 2 tabs po daily for 5 days, then 1 tab po daily for 5 days 15 tablet 0   vitamin B-12 (CYANOCOBALAMIN) 1000 MCG tablet Take 1 tablet (1,000 mcg total) by mouth daily.     zinc gluconate 50 MG tablet Take 1 tablet (50 mg total) by mouth daily.     No current facility-administered medications on file prior to visit.    Review of Systems  Constitutional:  Negative for chills and fever.  Respiratory:  Negative for cough.   Cardiovascular:  Negative for chest pain and palpitations.  Gastrointestinal:  Negative  for nausea and vomiting.  Genitourinary:  Positive for dysuria. Negative for flank pain.      Objective:    BP 130/72   Pulse 80   Temp 97.9 F (36.6 C) (Oral)   Ht 5\' 4"  (1.626 m)   Wt 167 lb 9.6 oz (76 kg)   SpO2 99%   BMI 28.77 kg/m   BP Readings from Last 3 Encounters:  10/19/22 130/72  10/04/22 122/84  09/21/22 118/80   Wt Readings from Last 3 Encounters:  10/19/22 167 lb 9.6 oz (76 kg)  10/04/22 165 lb 2 oz (74.9 kg)  09/21/22 165 lb 8 oz (75.1 kg)    Physical Exam Vitals reviewed.  Constitutional:      Appearance: She is well-developed.  Cardiovascular:     Rate and Rhythm: Normal rate and regular rhythm.     Pulses: Normal pulses.     Heart sounds: Normal heart sounds.  Pulmonary:      Effort: Pulmonary effort is normal.     Breath sounds: Normal breath sounds. No wheezing, rhonchi or rales.  Skin:    General: Skin is warm and dry.  Neurological:     Mental Status: She is alert.  Psychiatric:        Speech: Speech normal.        Behavior: Behavior normal.        Thought Content: Thought content normal.

## 2022-10-19 NOTE — Assessment & Plan Note (Addendum)
Afebrile.  Point-of-care urine moderate hematuria, leukocytes.  Negative nitrites.  I went ahead and sent in Augmentin advised patient to start antibiotic if symptoms were to worsen.  Otherwise patient and husband are inclined to wait on urine culture.  Urine culture, urinalysis pending at this time

## 2022-10-19 NOTE — Patient Instructions (Signed)
I have sent in Augmentin (antibiotic) for you to start if symptoms were to worsen.  Otherwise, we will wait on urine culture. Please drink plenty of water.  Ensure to take probiotics while on antibiotics and also for 2 weeks after completion. This can either be by eating yogurt daily or taking a probiotic supplement over the counter such as Culturelle.It is important to re-colonize the gut with good bacteria and also to prevent any diarrheal infections associated with antibiotic use.

## 2022-10-20 ENCOUNTER — Ambulatory Visit: Payer: Medicare HMO | Admitting: Family

## 2022-10-21 ENCOUNTER — Ambulatory Visit
Admission: RE | Admit: 2022-10-21 | Discharge: 2022-10-21 | Disposition: A | Discharge status: Home / Self Care | Payer: Medicare HMO | Source: Ambulatory Visit | Attending: Oncology | Admitting: Oncology

## 2022-10-21 DIAGNOSIS — E042 Nontoxic multinodular goiter: Secondary | ICD-10-CM | POA: Diagnosis not present

## 2022-10-21 DIAGNOSIS — C8218 Follicular lymphoma grade II, lymph nodes of multiple sites: Secondary | ICD-10-CM | POA: Diagnosis not present

## 2022-10-21 DIAGNOSIS — R1909 Other intra-abdominal and pelvic swelling, mass and lump: Secondary | ICD-10-CM | POA: Diagnosis not present

## 2022-10-21 DIAGNOSIS — D1809 Hemangioma of other sites: Secondary | ICD-10-CM | POA: Diagnosis not present

## 2022-10-21 DIAGNOSIS — C8290 Follicular lymphoma, unspecified, unspecified site: Secondary | ICD-10-CM | POA: Diagnosis not present

## 2022-10-21 MED ORDER — IOHEXOL 300 MG/ML  SOLN
100.0000 mL | Freq: Once | INTRAMUSCULAR | Status: AC | PRN
  Administered 2022-10-21: 100 mL via INTRAVENOUS

## 2022-10-22 LAB — URINE CULTURE
MICRO NUMBER:: 15483758
SPECIMEN QUALITY:: ADEQUATE

## 2022-10-25 NOTE — Addendum Note (Signed)
Addended by: [place], Amisha Pospisil on: 10/25/2022 03:47 PM   Modules accepted: Orders

## 2022-10-28 ENCOUNTER — Other Ambulatory Visit: Payer: BC Managed Care – PPO

## 2022-10-31 IMAGING — CT CT ABD-PELV W/ CM
2 of 5 series · 14 of 46 positions shown, 16 images · IV contrast (agent unspecified)
Comparison: Multiple priors including most recent CT November 27, 2020

CLINICAL DATA: Follow-up lymphoma.

EXAM:
CT ABDOMEN AND PELVIS WITH CONTRAST
TECHNIQUE: Multidetector CT imaging of the abdomen and pelvis was performed
using the standard protocol following bolus administration of
intravenous contrast.

[Series 2: abd pelvis 5.00 · axial · 0.65mm/px · z∈[-1524,-1064]mm · 11 of 104 slices shown, 13 images]
[im 6/104  soft-tissue]
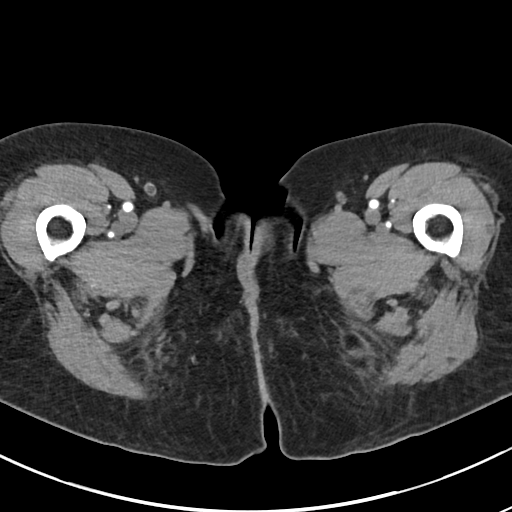
[im 6/104  bone]
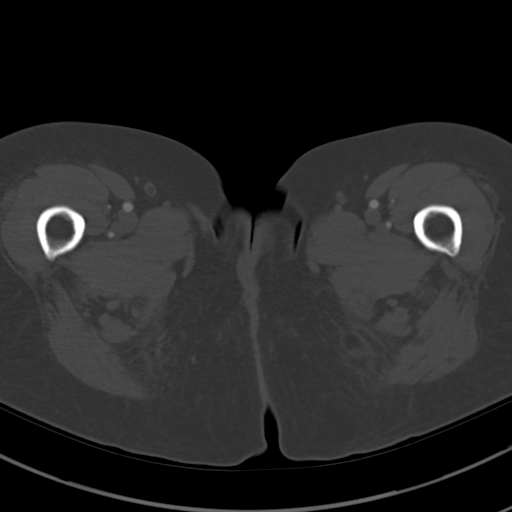
[im 17/104  soft-tissue]
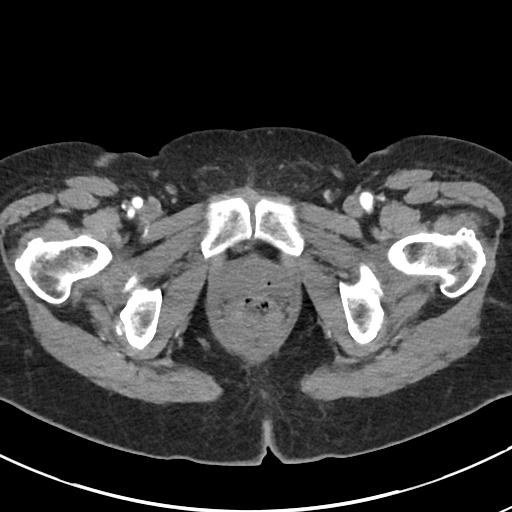
[im 28/104  soft-tissue]
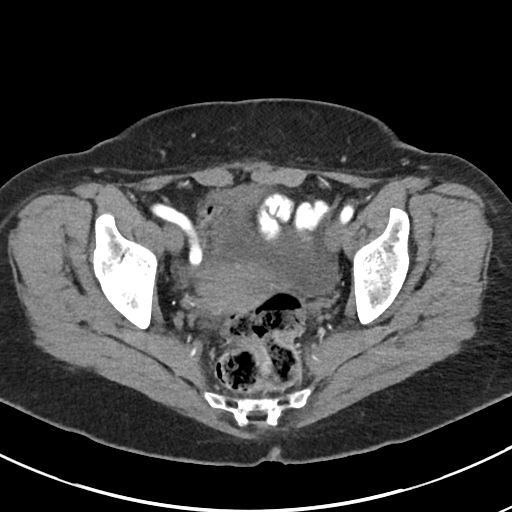
[im 33/104  soft-tissue]
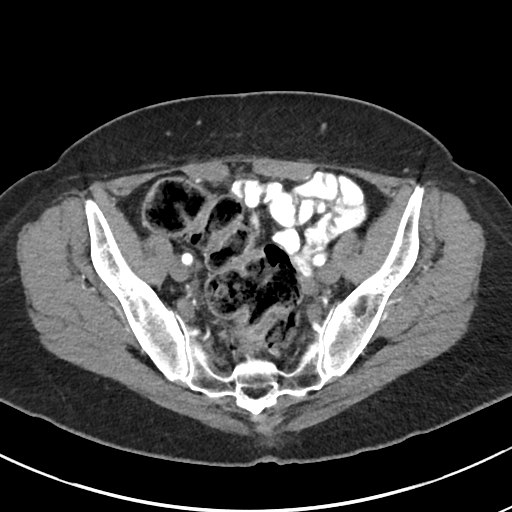
[im 44/104  soft-tissue]
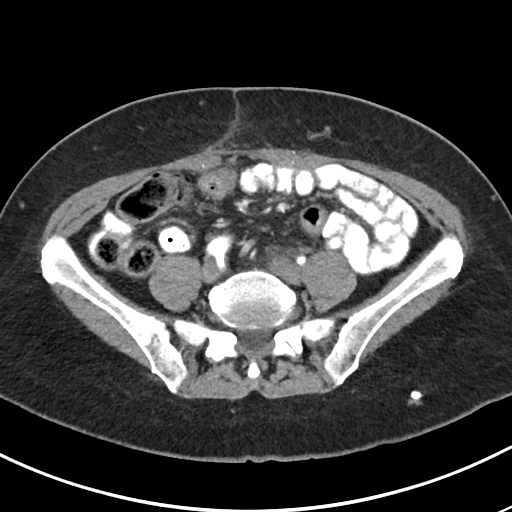
[im 55/104  soft-tissue]
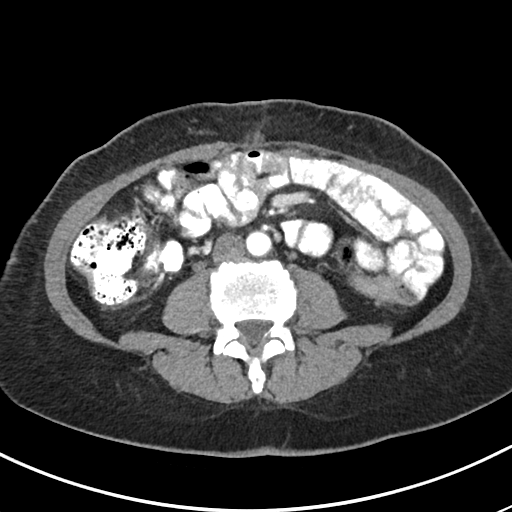
[im 60/104  soft-tissue]
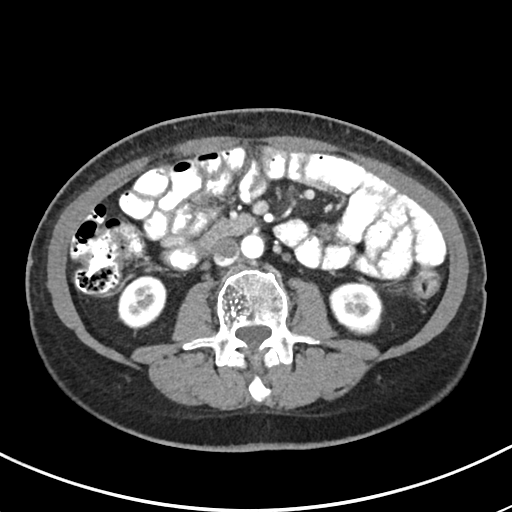
[im 71/104  soft-tissue]
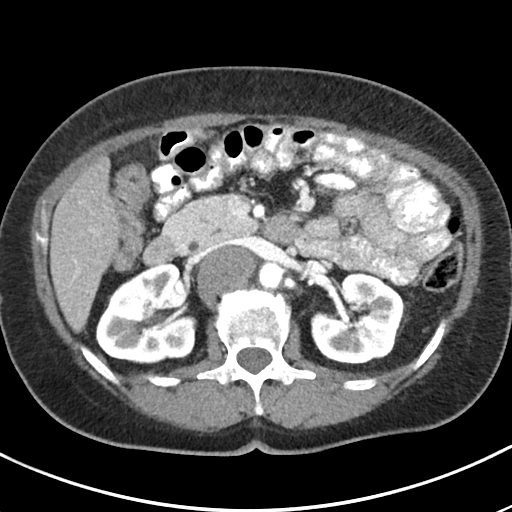
[im 76/104  soft-tissue]
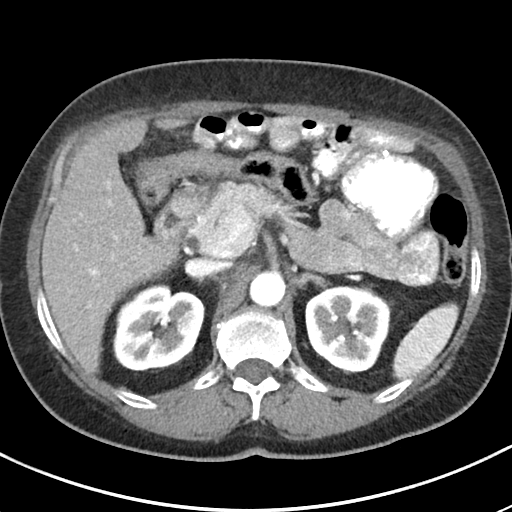
[im 76/104  bone]
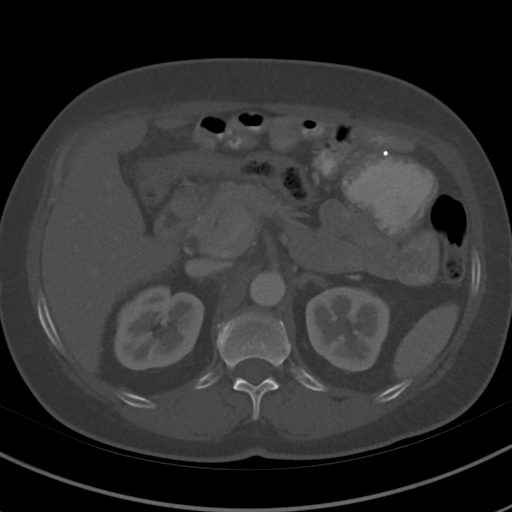
[im 87/104  soft-tissue]
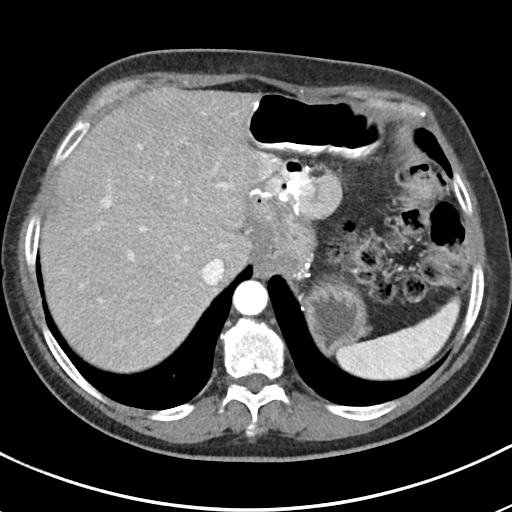
[im 98/104  soft-tissue]
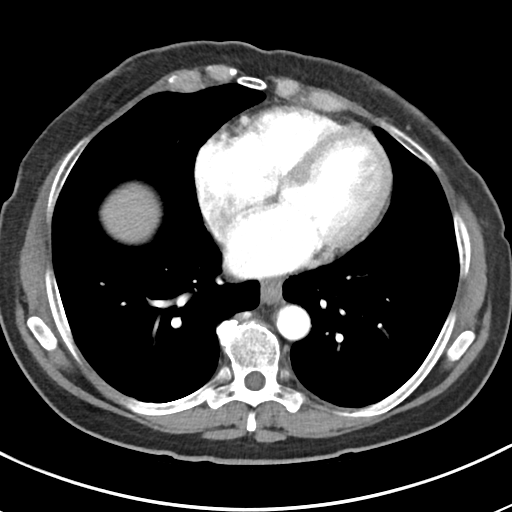

[Series 4: coronals abd pelvis 2.00 cor · coronal · 0.65mm/px · 3 of 140 slices shown]
[im 47/140  soft-tissue]
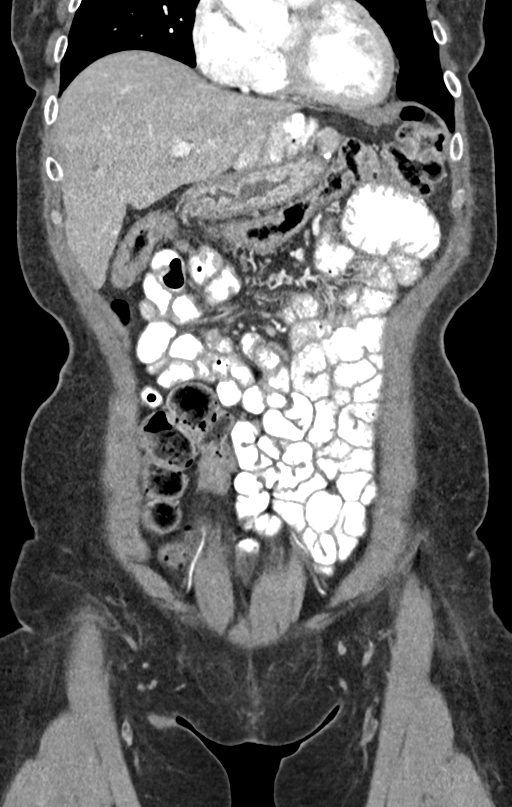
[im 62/140  soft-tissue]
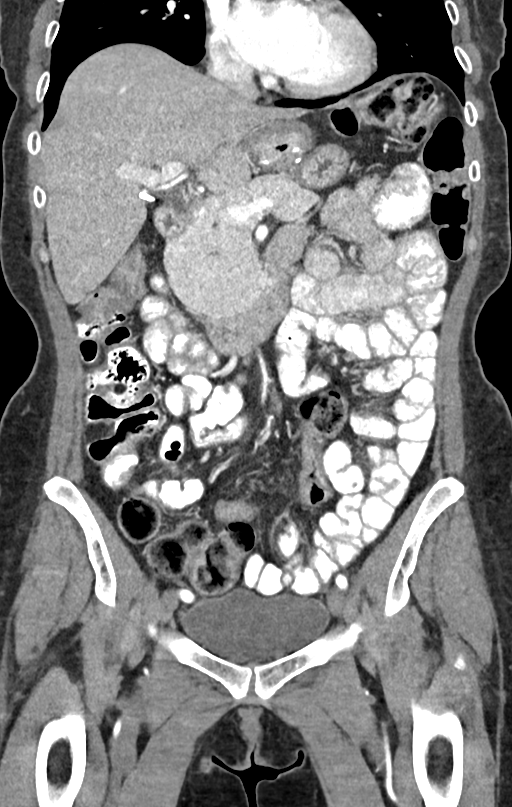
[im 78/140  soft-tissue]
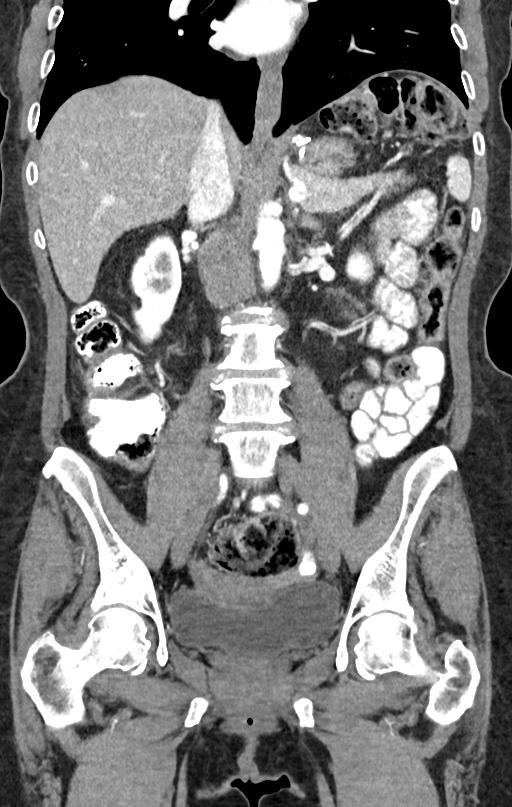

[14 of 46 positions shown; findings below may reference images not displayed]

RADIATION DOSE REDUCTION: This exam was performed according to the
departmental dose-optimization program which includes automated
exposure control, adjustment of the mA and/or kV according to
patient size and/or use of iterative reconstruction technique.

CONTRAST:  100mL OMNIPAQUE IOHEXOL 300 MG/ML  SOLN
FINDINGS: Lower chest: No acute abnormality.

Hepatobiliary: Tiny hypodense hepatic lesions are stable from prior
examinations and statistically likely to reflect benign cysts or
hemangiomas. Gallbladder surgically absent. Mild prominence of the
biliary tree is favored reservoir effect post cholecystectomy.

Pancreas: No pancreatic ductal dilation or evidence of acute
inflammation.

Spleen: Normal in size without focal abnormality.

Adrenals/Urinary Tract: Bilateral adrenal glands appear normal. No
hydronephrosis. Kidneys demonstrate symmetric enhancement excretion
of contrast material. No solid enhancing renal mass. Urinary bladder
is unremarkable for degree of distension.

Stomach/Bowel: Radiopaque enteric contrast material traverses the
cecum. Prior Roux-en-Y gastric bypass. No pathologic dilation of
large or small bowel. Terminal ileum appears normal. The appendix is
not confidently identified however there is no pericecal
inflammation. Moderate volume of formed stool throughout the colon
suggestive of constipation. No evidence of acute bowel inflammation.

Vascular/Lymphatic: Normal caliber abdominal aorta. Interval
increase in size of the right retroperitoneal soft tissue mass which
extends along the crus of the diaphragm. The mass effaces/narrows
the IVC and left renal vein now measuring 4.1 x 2.8 cm on image 34/2
previously 2.6 x 1.8 cm. No new sites of abdominopelvic adenopathy.

Reproductive: Uterus and bilateral adnexa are unremarkable.

Other: No significant abdominopelvic free fluid. No
pneumoperitoneum. Postsurgical change in the anterior abdominal
wall. Calcified granulomata in the posterior left gluteal
subcutaneous soft tissues.

Musculoskeletal: L3 vertebral body hemangioma. Multilevel
degenerative changes spine.
IMPRESSION: 1. Interval increase in size of the right retroperitoneal soft
tissue mass which now effaces/narrows the IVC and left renal vein
and measures 4.1 x 2.8 cm previously 2.6 x 1.8 cm.
2. No new sites of abdominopelvic adenopathy and no splenomegaly.
3. Moderate volume of formed stool throughout the colon suggestive
of constipation.
4. Prior Roux-en-Y gastric bypass without evidence of bowel
obstruction.

These results will be called to the ordering clinician or
representative by the Radiologist Assistant, and communication
documented in the PACS or [REDACTED].

## 2022-11-08 NOTE — Progress Notes (Unsigned)
Bartt Gonzaga T. Rayvin Abid, MD, CAQ Sports Medicine [facility] at [facility] [address] [place], [address]  Phone: [phone]  FAX: [phone]  Lynze Ashmore - 62 y.o. female  MRN [id number]  Date of Birth: 04-08-1960  Date: 11/09/2022  PCP: Eustaquio Boyden, MD  Referral: Eustaquio Boyden, MD  No chief complaint on file.  Subjective:   Melinda Hall is a 62 y.o. very pleasant female patient with There is no height or weight on file to calculate BMI. who presents with the following:  Patient is here to follow-up on right-sided Achilles tendinopathy.  Saw her on September 21, 2022.  At that point, I gave her a round of some prednisone and also started her on nitroglycerin protocol.  She also has started [employer]'s eccentric training program.  She also had some to a lesser extent peroneal tendinopathy on the left.    Review of Systems is noted in the HPI, as appropriate  Objective:   There were no vitals taken for this visit.  GEN: No acute distress; alert,appropriate. PULM: Breathing comfortably in no respiratory distress PSYCH: Normally interactive.   Laboratory and Imaging Data:  Assessment and Plan:   ***

## 2022-11-09 ENCOUNTER — Encounter: Payer: Self-pay | Admitting: Family Medicine

## 2022-11-09 ENCOUNTER — Ambulatory Visit (INDEPENDENT_AMBULATORY_CARE_PROVIDER_SITE_OTHER): Payer: Medicare HMO | Admitting: Family Medicine

## 2022-11-09 ENCOUNTER — Other Ambulatory Visit: Payer: Self-pay | Admitting: Oncology

## 2022-11-09 VITALS — BP 126/72 | HR 69 | Temp 98.6°F | Ht 64.0 in | Wt 170.0 lb

## 2022-11-09 DIAGNOSIS — M7672 Peroneal tendinitis, left leg: Secondary | ICD-10-CM

## 2022-11-09 DIAGNOSIS — M7661 Achilles tendinitis, right leg: Secondary | ICD-10-CM

## 2022-11-09 DIAGNOSIS — C8218 Follicular lymphoma grade II, lymph nodes of multiple sites: Secondary | ICD-10-CM

## 2022-11-09 MED ORDER — PREDNISONE 20 MG PO TABS: ORAL_TABLET | ORAL | 0 refills | Status: DC

## 2022-11-09 NOTE — Patient Instructions (Signed)
Increase the nitroglycerine patch to a full patch each day  We will repeat another round of prednisone, too.

## 2022-11-16 ENCOUNTER — Inpatient Hospital Stay: Payer: BC Managed Care – PPO

## 2022-11-16 ENCOUNTER — Encounter: Payer: Self-pay | Admitting: Oncology

## 2022-11-16 ENCOUNTER — Inpatient Hospital Stay: Payer: BC Managed Care – PPO | Attending: Oncology

## 2022-11-16 ENCOUNTER — Inpatient Hospital Stay (HOSPITAL_BASED_OUTPATIENT_CLINIC_OR_DEPARTMENT_OTHER): Payer: BC Managed Care – PPO | Admitting: Oncology

## 2022-11-16 VITALS — BP 113/80 | HR 59

## 2022-11-16 DIAGNOSIS — C8213 Follicular lymphoma grade II, intra-abdominal lymph nodes: Secondary | ICD-10-CM | POA: Diagnosis not present

## 2022-11-16 DIAGNOSIS — C8218 Follicular lymphoma grade II, lymph nodes of multiple sites: Secondary | ICD-10-CM

## 2022-11-16 DIAGNOSIS — Z87891 Personal history of nicotine dependence: Secondary | ICD-10-CM | POA: Insufficient documentation

## 2022-11-16 DIAGNOSIS — Z79620 Long term (current) use of immunosuppressive biologic: Secondary | ICD-10-CM | POA: Insufficient documentation

## 2022-11-16 DIAGNOSIS — Z5112 Encounter for antineoplastic immunotherapy: Secondary | ICD-10-CM | POA: Diagnosis not present

## 2022-11-16 LAB — CBC WITH DIFFERENTIAL/PLATELET
Abs Immature Granulocytes: 0.05 10*3/uL (ref 0.00–0.07)
Basophils Absolute: 0.1 10*3/uL (ref 0.0–0.1)
Basophils Relative: 1 %
Eosinophils Absolute: 0.2 10*3/uL (ref 0.0–0.5)
Eosinophils Relative: 3 %
HCT: 45.3 % (ref 36.0–46.0)
Hemoglobin: 14.8 g/dL (ref 12.0–15.0)
Immature Granulocytes: 1 %
Lymphocytes Relative: 30 %
Lymphs Abs: 2.3 10*3/uL (ref 0.7–4.0)
MCH: 30.3 pg (ref 26.0–34.0)
MCHC: 32.7 g/dL (ref 30.0–36.0)
MCV: 92.6 fL (ref 80.0–100.0)
Monocytes Absolute: 0.8 10*3/uL (ref 0.1–1.0)
Monocytes Relative: 10 %
Neutro Abs: 4.2 10*3/uL (ref 1.7–7.7)
Neutrophils Relative %: 55 %
Platelets: 253 10*3/uL (ref 150–400)
RBC: 4.89 MIL/uL (ref 3.87–5.11)
RDW: 13.2 % (ref 11.5–15.5)
WBC: 7.6 10*3/uL (ref 4.0–10.5)
nRBC: 0 % (ref 0.0–0.2)

## 2022-11-16 MED ORDER — SODIUM CHLORIDE 0.9 % IV SOLN
Freq: Once | INTRAVENOUS | Status: AC
  Filled 2022-11-16: qty 250

## 2022-11-16 MED ORDER — SODIUM CHLORIDE 0.9 % IV SOLN
375.0000 mg/m2 | Freq: Once | INTRAVENOUS | Status: AC
  Administered 2022-11-16: 700 mg via INTRAVENOUS
  Filled 2022-11-16: qty 50

## 2022-11-16 MED ORDER — DIPHENHYDRAMINE HCL 25 MG PO CAPS
50.0000 mg | ORAL_CAPSULE | Freq: Once | ORAL | Status: AC
  Administered 2022-11-16: 50 mg via ORAL
  Filled 2022-11-16: qty 2

## 2022-11-16 MED ORDER — ACETAMINOPHEN 325 MG PO TABS
650.0000 mg | ORAL_TABLET | Freq: Once | ORAL | Status: AC
  Administered 2022-11-16: 650 mg via ORAL
  Filled 2022-11-16: qty 2

## 2022-11-16 NOTE — Patient Instructions (Signed)
Rituximab Injection What is this medication? RITUXIMAB (ri TUX i mab) treats leukemia and lymphoma. It works by blocking a protein that causes cancer cells to grow and multiply. This helps to slow or stop the spread of cancer cells. It may also be used to treat autoimmune conditions, such as arthritis. It works by slowing down an overactive immune system. It is a monoclonal antibody. This medicine may be used for other purposes; ask your health care provider or pharmacist if you have questions. COMMON BRAND NAME(S): RIABNI, Rituxan, RUXIENCE, truxima What should I tell my care team before I take this medication? They need to know if you have any of these conditions: Chest pain Heart disease Immune system problems Infection, such as chickenpox, cold sores, hepatitis B, herpes Irregular heartbeat or rhythm Kidney disease Low blood counts, such as low white cells, platelets, red cells Lung disease Recent or upcoming vaccine An unusual or allergic reaction to rituximab, other medications, foods, dyes, or preservatives Pregnant or trying to get pregnant Breast-feeding How should I use this medication? This medication is injected into a vein. It is given by a care team in a hospital or clinic setting. A special MedGuide will be given to you before each treatment. Be sure to read this information carefully each time. Talk to your care team about the use of this medication in children. While this medication may be prescribed for children as young as 6 months for selected conditions, precautions do apply. Overdosage: If you think you have taken too much of this medicine contact a poison control center or emergency room at once. NOTE: This medicine is only for you. Do not share this medicine with others. What if I miss a dose? Keep appointments for follow-up doses. It is important not to miss your dose. Call your care team if you are unable to keep an appointment. What may interact with this  medication? Do not take this medication with any of the following: Live vaccines This medication may also interact with the following: Cisplatin This list may not describe all possible interactions. Give your health care provider a list of all the medicines, herbs, non-prescription drugs, or dietary supplements you use. Also tell them if you smoke, drink alcohol, or use illegal drugs. Some items may interact with your medicine. What should I watch for while using this medication? Your condition will be monitored carefully while you are receiving this medication. You may need blood work while taking this medication. This medication can cause serious infusion reactions. To reduce the risk your care team may give you other medications to take before receiving this one. Be sure to follow the directions from your care team. This medication may increase your risk of getting an infection. Call your care team for advice if you get a fever, chills, sore throat, or other symptoms of a cold or flu. Do not treat yourself. Try to avoid being around people who are sick. Call your care team if you are around anyone with measles, chickenpox, or if you develop sores or blisters that do not heal properly. Avoid taking medications that contain aspirin, acetaminophen, ibuprofen, naproxen, or ketoprofen unless instructed by your care team. These medications may hide a fever. This medication may cause serious skin reactions. They can happen weeks to months after starting the medication. Contact your care team right away if you notice fevers or flu-like symptoms with a rash. The rash may be red or purple and then turn into blisters or peeling of the skin.  You may also notice a red rash with swelling of the face, lips, or lymph nodes in your neck or under your arms. In some patients, this medication may cause a serious brain infection that may cause death. If you have any problems seeing, thinking, speaking, walking, or  standing, tell your care team right away. If you cannot reach your care team, urgently seek another source of medical care. Talk to your care team if you may be pregnant. Serious birth defects can occur if you take this medication during pregnancy and for 12 months after the last dose. You will need a negative pregnancy test before starting this medication. Contraception is recommended while taking this medication and for 12 months after the last dose. Your care team can help you find the option that works for you. Do not breastfeed while taking this medication and for at least 6 months after the last dose. What side effects may I notice from receiving this medication? Side effects that you should report to your care team as soon as possible: Allergic reactions or angioedema--skin rash, itching or hives, swelling of the face, eyes, lips, tongue, arms, or legs, trouble swallowing or breathing Bowel blockage--stomach cramping, unable to have a bowel movement or pass gas, loss of appetite, vomiting Dizziness, loss of balance or coordination, confusion or trouble speaking Heart attack--pain or tightness in the chest, shoulders, arms, or jaw, nausea, shortness of breath, cold or clammy skin, feeling faint or lightheaded Heart rhythm changes--fast or irregular heartbeat, dizziness, feeling faint or lightheaded, chest pain, trouble breathing Infection--fever, chills, cough, sore throat, wounds that don't heal, pain or trouble when passing urine, general feeling of discomfort or being unwell Infusion reactions--chest pain, shortness of breath or trouble breathing, feeling faint or lightheaded Kidney injury--decrease in the amount of urine, swelling of the ankles, hands, or feet Liver injury--right upper belly pain, loss of appetite, nausea, light-colored stool, dark yellow or brown urine, yellowing skin or eyes, unusual weakness or fatigue Redness, blistering, peeling, or loosening of the skin, including  inside the mouth Stomach pain that is severe, does not go away, or gets worse Tumor lysis syndrome (TLS)--nausea, vomiting, diarrhea, decrease in the amount of urine, dark urine, unusual weakness or fatigue, confusion, muscle pain or cramps, fast or irregular heartbeat, joint pain Side effects that usually do not require medical attention (report to your care team if they continue or are bothersome): Headache Joint pain Nausea Runny or stuffy nose Unusual weakness or fatigue This list may not describe all possible side effects. Call your doctor for medical advice about side effects. You may report side effects to FDA at 1-800-FDA-1088. Where should I keep my medication? This medication is given in a hospital or clinic. It will not be stored at home. NOTE: This sheet is a summary. It may not cover all possible information. If you have questions about this medicine, talk to your doctor, pharmacist, or health care provider.  2024 Elsevier/Gold Standard (2021-06-10 00:00:00)

## 2022-11-16 NOTE — Progress Notes (Signed)
[facility]  [phone] [phone]  ID: Melinda Hall OB: 1961-01-28  MR#: [id number]  AOZ#:[id number]  Patient Care Team: Eustaquio Boyden, MD as PCP - General (Family Medicine) Jeralyn Ruths, MD as Consulting Physician (Oncology) Lucy Chris, MD as Consulting Physician (Neurosurgery)  CHIEF COMPLAINT: Recurrent stage III follicular lymphoma, grade 1/2.    INTERVAL HISTORY: Patient returns to clinic today for further evaluation and continuation of maintenance Rituxan.  She continues to have occasional night sweats, but these do not affect her day-to-day activity.  She otherwise feels well.  She continues to tolerate her treatment without significant side effects.  She denies any recent fevers or illnesses.  She has a good appetite and denies weight loss.  She has no neurologic complaints.  She does not complain of any weakness or fatigue today. She has no chest pain, shortness of breath, cough, or hemoptysis.  She denies any nausea, vomiting, constipation, or diarrhea.  She has no urinary complaints.  Patient offers no further specific complaints today.  REVIEW OF SYSTEMS:   Review of Systems  Constitutional:  Positive for diaphoresis. Negative for fever, malaise/fatigue and weight loss.  Respiratory: Negative.  Negative for cough, hemoptysis and shortness of breath.   Cardiovascular: Negative.  Negative for chest pain and leg swelling.  Gastrointestinal: Negative.  Negative for abdominal pain.  Genitourinary: Negative.  Negative for dysuria.  Musculoskeletal: Negative.  Negative for back pain and myalgias.  Skin: Negative.  Negative for rash.  Neurological:  Positive for sensory change. Negative for tingling, focal weakness, weakness and headaches.  Psychiatric/Behavioral: Negative.  Negative for depression. The patient is not nervous/anxious.     As per HPI. Otherwise, a complete review of systems is negative.  PAST MEDICAL  HISTORY: Past Medical History:  Diagnosis Date   Cancer (HCC)    Dermoid tumor    hx of--left ovary   Follicular lymphoma (HCC)    Gastric bypass status for obesity 2004   s/p roux-en-y   Generalized headaches    frequent   Heart murmur    History of cardiac murmur    Intraductal papilloma of breast, left 06/18/2017   05/2017 - s/p excision 07/2017   Osteoporosis 03/2016   DEXA -2.6 spine   Salivary gland stone    x 2   Smoker     PAST SURGICAL HISTORY: Past Surgical History:  Procedure Laterality Date   ABDOMINAL HYSTERECTOMY     only ovary was removed   ANTERIOR CERVICAL DECOMP/DISCECTOMY FUSION  12/2016   Pool   APPENDECTOMY  1980   BILATERAL OOPHORECTOMY  ~1992   for dermoid ovarian cysts   BREAST BIOPSY Left 06/13/2017   papilloma with excision 07/2017   BREAST LUMPECTOMY WITH NEEDLE LOCALIZATION Left 08/10/2017   intraductal papilloma - Sterling Big F, MD   CHOLECYSTECTOMY  2000s   COLONOSCOPY WITH PROPOFOL N/A 11/27/2015   TA rpt 5 yrs Midge Minium, MD)   COLONOSCOPY WITH PROPOFOL N/A 05/17/2021   TAs, SSPs, rpt 3 yrs Servando Snare, Darren, MD)   GASTRIC BYPASS  2004   roux en y [facility])   POLYPECTOMY  11/27/2015   Procedure: POLYPECTOMY;  Surgeon: Midge Minium, MD;  Location: [facility];  Service: Endoscopy;;   POLYPECTOMY  05/17/2021   Procedure: POLYPECTOMY;  Surgeon: Midge Minium, MD;  Location: [facility];  Service: Endoscopy;;   REDUCTION MAMMAPLASTY Bilateral 1990's   SALIVARY GLAND SURGERY     SALIVARY STONE REMOVAL      FAMILY  HISTORY: Family History  Problem Relation Age of Onset   Hypertension Mother    Cancer Father 65       leukemia   Cancer Brother 50       kidney   Diabetes Neg Hx    Stroke Neg Hx    Coronary artery disease Neg Hx    Breast cancer Neg Hx     ADVANCED DIRECTIVES (Y/N):  N  HEALTH MAINTENANCE: Social History   Tobacco Use   Smoking status: Former    Current packs/day: 0.00    Average packs/day: 0.3  packs/day for 20.0 years (5.0 ttl pk-yrs)    Types: Cigarettes    Start date: 10/31/1992    Quit date: 10/31/2012    Years since quitting: 10.0   Smokeless tobacco: Never  Vaping Use   Vaping status: Never Used  Substance Use Topics   Alcohol use: Not Currently   Drug use: No     Colonoscopy:  PAP:  Bone density:  Lipid panel:  Allergies  Allergen Reactions   Boniva [Ibandronic Acid] Other (See Comments)    Diffuse body aches    Current Outpatient Medications  Medication Sig Dispense Refill   alendronate (FOSAMAX) 70 MG tablet TAKE 1 TABLET (70 MG TOTAL) BY MOUTH EVERY 7 DAYS WITH FULL GLASS WATER ON EMPTY STOMACH 12 tablet 1   buPROPion ER (WELLBUTRIN SR) 100 MG 12 hr tablet Take 1 tablet (100 mg total) by mouth in the morning. 90 tablet 4   calcium carbonate (OS-CAL) 600 MG TABS tablet Take 600 mg by mouth daily with breakfast.     Cholecalciferol (VITAMIN D3) 1000 units CAPS Take 3 capsules (3,000 Units total) by mouth daily.     ferrous sulfate 325 (65 FE) MG tablet Take 1 tablet (325 mg total) by mouth every Monday, Wednesday, and Friday.     hydrOXYzine (ATARAX) 25 MG tablet Take 0.5-1 tablets (12.5-25 mg total) by mouth 3 (three) times daily as needed. 30 tablet 0   Magnesium 250 MG TABS Take 1 tablet (250 mg total) by mouth daily.  0   naproxen (NAPROSYN) 500 MG tablet Take 1 tablet (500 mg total) by mouth 2 (two) times daily with a meal. For 1 week then as needed 40 tablet 0   nitroGLYCERIN (NITRODUR - DOSED IN MG/24 HR) 0.2 mg/hr patch Apply 1/4 patch to affected area as directed by MD and change every 24 hours. 30 patch 2   predniSONE (DELTASONE) 20 MG tablet 2 tabs po daily for 5 days, then 1 tab po daily for 5 days 15 tablet 0   vitamin B-12 (CYANOCOBALAMIN) 1000 MCG tablet Take 1 tablet (1,000 mcg total) by mouth daily.     zinc gluconate 50 MG tablet Take 1 tablet (50 mg total) by mouth daily.     No current facility-administered medications for this visit.     OBJECTIVE: Vitals:   11/16/22 0837  BP: 131/86  Pulse: (!) 57  Resp: 16  Temp: (!) 96.2 F (35.7 C)  SpO2: 100%     Body mass index is 28.91 kg/m.    ECOG FS:0 - Asymptomatic  General: Well-developed, well-nourished, no acute distress. Eyes: Pink conjunctiva, anicteric sclera. HEENT: Normocephalic, moist mucous membranes. Lungs: No audible wheezing or coughing. Heart: Regular rate and rhythm. Abdomen: Soft, nontender, no obvious distention. Musculoskeletal: No edema, cyanosis, or clubbing. Neuro: Alert, answering all questions appropriately. Cranial nerves grossly intact. Skin: No rashes or petechiae noted. Psych: Normal affect.  LAB RESULTS:  Lab Results  Component Value Date   NA 138 09/21/2022   K 4.8 09/21/2022   CL 105 09/21/2022   CO2 27 09/21/2022   GLUCOSE 104 (H) 09/21/2022   BUN 14 09/21/2022   CREATININE 0.68 09/21/2022   CALCIUM 9.3 09/21/2022   PROT 6.7 09/21/2022   ALBUMIN 4.1 09/21/2022   AST 19 09/21/2022   ALT 19 09/21/2022   ALKPHOS 51 09/21/2022   BILITOT 0.6 09/21/2022   GFRNONAA >60 09/21/2022   GFRAA >60 07/10/2013    Lab Results  Component Value Date   WBC 7.6 11/16/2022   NEUTROABS 4.2 11/16/2022   HGB 14.8 11/16/2022   HCT 45.3 11/16/2022   MCV 92.6 11/16/2022   PLT 253 11/16/2022     STUDIES: CT CHEST ABDOMEN PELVIS W CONTRAST  Result Date: 10/21/2022 CLINICAL DATA:  Hematologic malignancy, follow-up history of follicular lymphoma. * Tracking Code: BO * EXAM: CT CHEST, ABDOMEN, AND PELVIS WITH CONTRAST TECHNIQUE: Multidetector CT imaging of the chest, abdomen and pelvis was performed following the standard protocol during bolus administration of intravenous contrast. RADIATION DOSE REDUCTION: This exam was performed according to the departmental dose-optimization program which includes automated exposure control, adjustment of the mA and/or kV according to patient size and/or use of iterative reconstruction technique.  CONTRAST:  OMNIPAQUE IOHEXOL 300 MG/ML  SOLN COMPARISON:  Multiple priors including most recent CT May 27, 2022 FINDINGS: CT CHEST FINDINGS Cardiovascular: Normal caliber thoracic aorta. No central pulmonary embolus on this nondedicated study. Normal size heart. No significant pericardial effusion/thickening. Mediastinum/Nodes: Similar size of the bilateral thyroid nodules measure up to 16 mm in the left lobe of the thyroid on image 5/2 previously assessed by thyroid ultrasound and FNA. Similar similar thymic rebound. No pathologically enlarged mediastinal, hilar or axillary lymph nodes. The esophagus is grossly unremarkable. Lungs/Pleura: No aggressive lytic or blastic lesion of bone. Interval resolution of the ground-glass opacity seen on prior examination. No pleural effusion. No pneumothorax. Musculoskeletal: No aggressive lytic or blastic lesion of bone. CT ABDOMEN PELVIS FINDINGS Hepatobiliary: Diffuse hepatic steatosis. Scattered tiny hypodensities for instance in the left lobe of the liver measuring 8 mm on image 38/2 are stable from prior examinations and while technically too small to accurately characterize compatible with a benign finding such as a cysts. Gallbladder surgically absent. Similar prominence of the biliary tree compatible with reservoir effect post cholecystectomy. Pancreas: No pancreatic ductal dilation or evidence of acute inflammation. Spleen: No splenomegaly. Adrenals/Urinary Tract: Bilateral adrenal glands appear normal. No hydronephrosis. Kidneys demonstrate symmetric enhancement. Urinary bladder is unremarkable for degree of distension. Stomach/Bowel: Radiopaque enteric contrast material traverses the ascending colon. Prior gastric bypass surgery. Small bowel small bowel intussusception at the suture line in the anterior left hemiabdomen on image 50/2. No pathologic dilation of small or large bowel. No evidence of acute bowel inflammation. Vascular/Lymphatic: Normal caliber  abdominal aorta. Retrocaval retroperitoneal soft tissue mass/lymph node at the level of the renal veins measures 2.8 x 1.9 cm on image 58/2 previously 3.4 x 2.1 cm. No new pathologically enlarged abdominal or pelvic lymph nodes. Reproductive: Uterus and bilateral adnexa are unremarkable. Other: No significant abdominopelvic free fluid. Musculoskeletal: No aggressive lytic or blastic lesion of bone. L3 vertebral body hemangioma. Multilevel degenerative changes spine. IMPRESSION: 1. Interval decrease in size of the retrocaval retroperitoneal soft tissue mass/lymph node at the level of the renal veins. No new pathologically enlarged lymph nodes within the chest, abdomen or pelvis. 2. Diffuse hepatic steatosis. 3. Small bowel small bowel  intussusception at a gastric bypass enterotomy suture line in the anterior left hemiabdomen, likely transient. No evidence of bowel obstruction or acute bowel inflammation. Electronically Signed   By: Maudry Mayhew M.D.   On: 10/21/2022 16:34    ONCOLOGY HISTORY: Retroperitoneal lymph node biopsy on April 21, 2020 confirmed the diagnosis. PET scan on April 14, 2020 revealed a hypermetabolic right sided retroperitoneal nodal mass along with hypermetabolic mesenteric adenopathy.  Patient also noted to have bilateral axillary adenopathy.  Although patient was asymptomatic and active surveillance was offered, she elected to pursue treatment and received IV Rituxan weekly x4 completing on May 28, 2020.  Repeat PET scan from August 24, 2020 reviewed independently with significant treatment response, although likely residual disease.  Patient previously declined maintenance Rituxan.   ASSESSMENT: Recurrent stage III follicular lymphoma, grade 1/2.    PLAN:    Recurrent stage III follicular lymphoma, grade 1/2:  Repeat imaging on March 15, 2021 reviewed independently with progression of disease.  Patient subsequently reinitiated treatment and received weekly Rituxan x 4 between  March 31, 2021 and April 21, 2021.  She then initiated maintenance Rituxan every 8 weeks on Jun 23, 2021.  Will continue treatment every 8 weeks for up to 2 years completing in May 2025.  Repeat CT scan on October 21, 2022 reviewed independently and reported as above with continued improvement of disease burden.  Proceed with cycle 8 of maintenance Rituxan today.  Return to clinic in 8 weeks for further evaluation and consideration of cycle 9. Hypermetabolic thyroid nodule: Previously, biopsy was negative for malignancy. Depression: Significantly improved.  Continue current medications as prescribed. Hot flashes: Intermittent.  Given improvement of disease burden, unlikely related to underlying malignancy.  I spent a total of 30 minutes reviewing chart data, face-to-face evaluation with the patient, counseling and coordination of care as detailed above.   Patient expressed understanding and was in agreement with this plan. She also understands that She can call clinic at any time with any questions, concerns, or complaints.    Cancer Staging  Follicular lymphoma (HCC) Staging form: Hodgkin and Non-Hodgkin Lymphoma, AJCC 8th Edition - Clinical stage from 04/29/2020: Stage III (Follicular lymphoma) - Signed by Jeralyn Ruths, MD on 04/29/2020 Stage prefix: Initial diagnosis   Jeralyn Ruths, MD   11/16/2022 1:45 PM

## 2022-11-17 ENCOUNTER — Ambulatory Visit: Payer: BC Managed Care – PPO | Admitting: Family Medicine

## 2022-11-18 ENCOUNTER — Other Ambulatory Visit: Payer: Self-pay

## 2022-11-21 NOTE — Telephone Encounter (Signed)
Pt seen today, asked for update on Prolia benefits verification status - can we check on this? Thanks.

## 2022-11-23 ENCOUNTER — Other Ambulatory Visit (HOSPITAL_COMMUNITY): Payer: Self-pay

## 2022-11-23 NOTE — Telephone Encounter (Signed)
Pharmacy Patient Advocate Encounter   Received notification from  [facility] Portal that prior authorization for PROLIA is required/requested.   Insurance verification completed.   The patient is insured through [employer] .   Per test claim: PA required; PA submitted to [employer] via Availity Key/confirmation #/[id number] Status is pending

## 2022-11-24 NOTE — Telephone Encounter (Signed)
Faxed Prolia prior authorization to [employer] at [phone] status is pending.

## 2022-11-24 NOTE — Telephone Encounter (Signed)
Patient is covered by [employer] and [employer].   [employer] Medicare - member responsible for 20% copay no deductible, PA required (approved)  [employer] - member responsible for 20% copay, deductible has been met PA required (submitted)

## 2022-12-01 ENCOUNTER — Other Ambulatory Visit (HOSPITAL_COMMUNITY): Payer: Self-pay

## 2022-12-01 ENCOUNTER — Encounter: Payer: Self-pay | Admitting: Oncology

## 2022-12-01 NOTE — Telephone Encounter (Signed)
Pt ready for scheduling for Prolia on or after : 12/01/22  Out-of-pocket cost due at time of visit: $0  Primary: [employer] Prolia co-insurance: 100% Admin fee co-insurance: 0%  Secondary: [employer] - Medicare Prolia co-insurance: 20% Admin fee co-insurance: 0%  Medical Benefit Details: Date Benefits were checked: 12/01/22 Deductible: met/ Coinsurance: 20%/ Admin Fee: 0%  Prior Auth: Approved PA# [id number] ([employer]) Expiration Date: 11/24/22 to 11/23/23  # of doses approved: 2 visits  Prior Auth: pending PA# [id number] Monia Pouch) Expiration Date:   # of doses approved:  Pharmacy benefit: Copay $-- If patient wants fill through the pharmacy benefit please send prescription to:  -- , and include estimated need by date in rx notes. Pharmacy will ship medication directly to the office.  Patient not eligible for Prolia Copay Card. Copay Card can make patient's cost as little as $25. Link to apply: [email or web address]://[email or web address]  ** This summary of benefits is an estimation of the patient's out-of-pocket cost. Exact cost may very based on individual plan coverage.    Call reference: [id number]

## 2022-12-01 NOTE — Telephone Encounter (Signed)
Received approval for Prolia injection from 11/24/22 to 11/23/23 for 2 visits   Pa # [id number]  Approval letter indexed to media tab

## 2022-12-06 NOTE — Telephone Encounter (Signed)
Left message to return call to our office.  

## 2022-12-14 ENCOUNTER — Other Ambulatory Visit: Payer: Self-pay

## 2022-12-14 DIAGNOSIS — M810 Age-related osteoporosis without current pathological fracture: Secondary | ICD-10-CM

## 2022-12-14 NOTE — Telephone Encounter (Signed)
Called patient reviewed all following information including appointment, Co pay due at time of visit and if pick up of injection is needed from outside pharmacy.     Out of pocket for patient: $0   Lab appointment :  11/25  Nurse visit:  11/27  Lab order placed: Yes  Prolia has been  [x]   Ordered  []   Script sent to local pharmacy for patient to bring   []   Script sent to Specialty pharmacy   []   Script sent to [facility] to deliver

## 2022-12-25 NOTE — Progress Notes (Unsigned)
    Tamyrah Burbage T. Adriaan Maltese, MD, CAQ Sports Medicine [facility] at [facility] [address] [place], [address]  Phone: [phone]  FAX: [phone]  Blaiklee Pawloski - 62 y.o. female  MRN [id number]  Date of Birth: [birth year]  Date: 12/26/2022  PCP: Eustaquio Boyden, MD  Referral: Eustaquio Boyden, MD  No chief complaint on file.  Subjective:   Melinda Hall is a 62 y.o. very pleasant female patient with There is no height or weight on file to calculate BMI. who presents with the following:  Patient presents for follow-up on right-sided Achilles tendinopathy.  I saw her in August then again in October and follow-up.  She had titrated upwards on her nitroglycerin protocol, now she is using a full patch.  She also has been doing [employer] eccentric training program.  She also had been using a Tuli's heel cup.    Review of Systems is noted in the HPI, as appropriate  Objective:   There were no vitals taken for this visit.  GEN: No acute distress; alert,appropriate. PULM: Breathing comfortably in no respiratory distress PSYCH: Normally interactive.   Laboratory and Imaging Data:  Assessment and Plan:   ***

## 2022-12-26 ENCOUNTER — Encounter: Payer: Self-pay | Admitting: Family Medicine

## 2022-12-26 ENCOUNTER — Ambulatory Visit (INDEPENDENT_AMBULATORY_CARE_PROVIDER_SITE_OTHER): Payer: Medicare HMO | Admitting: Family Medicine

## 2022-12-26 ENCOUNTER — Other Ambulatory Visit (INDEPENDENT_AMBULATORY_CARE_PROVIDER_SITE_OTHER): Payer: Medicare HMO

## 2022-12-26 VITALS — BP 110/90 | HR 65 | Temp 98.1°F | Ht 64.0 in | Wt 170.2 lb

## 2022-12-26 DIAGNOSIS — M7661 Achilles tendinitis, right leg: Secondary | ICD-10-CM

## 2022-12-26 DIAGNOSIS — M810 Age-related osteoporosis without current pathological fracture: Secondary | ICD-10-CM | POA: Diagnosis not present

## 2022-12-26 DIAGNOSIS — M7662 Achilles tendinitis, left leg: Secondary | ICD-10-CM | POA: Diagnosis not present

## 2022-12-26 LAB — BASIC METABOLIC PANEL
BUN: 17 mg/dL (ref 6–23)
CO2: 30 meq/L (ref 19–32)
Calcium: 9.4 mg/dL (ref 8.4–10.5)
Chloride: 104 meq/L (ref 96–112)
Creatinine, Ser: 0.67 mg/dL (ref 0.40–1.20)
GFR: 93.23 mL/min (ref >60.00)
Glucose, Bld: 90 mg/dL (ref 70–99)
Potassium: 4.7 meq/L (ref 3.5–5.1)
Sodium: 141 meq/L (ref 135–145)

## 2022-12-26 MED ORDER — NITROGLYCERIN 0.2 MG/HR TD PT24: MEDICATED_PATCH | TRANSDERMAL | 5 refills | Status: DC

## 2022-12-27 ENCOUNTER — Other Ambulatory Visit: Payer: Self-pay | Admitting: Family Medicine

## 2022-12-27 NOTE — Telephone Encounter (Signed)
last visit Dr. Patsy Lager tendonitis 12/26/22 Dr. Reece Agar 10/04/22 f/lu  Next visit n/a  last refill  07/22/22 #40 no rf

## 2022-12-28 ENCOUNTER — Ambulatory Visit: Payer: Medicare HMO

## 2022-12-28 DIAGNOSIS — M810 Age-related osteoporosis without current pathological fracture: Secondary | ICD-10-CM | POA: Diagnosis not present

## 2022-12-28 MED ORDER — DENOSUMAB 60 MG/ML ~~LOC~~ SOSY
60.0000 mg | PREFILLED_SYRINGE | Freq: Once | SUBCUTANEOUS | Status: AC
  Administered 2022-12-28: 60 mg via SUBCUTANEOUS

## 2022-12-28 MED ORDER — DENOSUMAB 60 MG/ML ~~LOC~~ SOSY: 60.0000 mg | PREFILLED_SYRINGE | Freq: Once | SUBCUTANEOUS | Status: DC

## 2022-12-28 NOTE — Progress Notes (Signed)
Patient presented for 28-month Prolia injection SQ to left arm given by Wendie Simmer, CMA. Patient tolerated injection well.

## 2023-01-10 ENCOUNTER — Other Ambulatory Visit: Payer: Self-pay | Admitting: *Deleted

## 2023-01-10 DIAGNOSIS — C8218 Follicular lymphoma grade II, lymph nodes of multiple sites: Secondary | ICD-10-CM

## 2023-01-11 ENCOUNTER — Inpatient Hospital Stay (HOSPITAL_BASED_OUTPATIENT_CLINIC_OR_DEPARTMENT_OTHER): Payer: BC Managed Care – PPO | Admitting: Oncology

## 2023-01-11 ENCOUNTER — Encounter: Payer: Self-pay | Admitting: Oncology

## 2023-01-11 ENCOUNTER — Inpatient Hospital Stay: Payer: BC Managed Care – PPO

## 2023-01-11 ENCOUNTER — Inpatient Hospital Stay: Payer: Medicare HMO | Attending: Oncology

## 2023-01-11 VITALS — BP 121/92 | HR 74 | Temp 97.8°F | Resp 16 | Ht 64.0 in | Wt 170.0 lb

## 2023-01-11 VITALS — BP 118/69 | HR 68 | Temp 98.0°F | Resp 16

## 2023-01-11 DIAGNOSIS — C8218 Follicular lymphoma grade II, lymph nodes of multiple sites: Secondary | ICD-10-CM

## 2023-01-11 DIAGNOSIS — Z5112 Encounter for antineoplastic immunotherapy: Secondary | ICD-10-CM | POA: Diagnosis present

## 2023-01-11 DIAGNOSIS — C8213 Follicular lymphoma grade II, intra-abdominal lymph nodes: Secondary | ICD-10-CM | POA: Diagnosis present

## 2023-01-11 DIAGNOSIS — Z87891 Personal history of nicotine dependence: Secondary | ICD-10-CM | POA: Diagnosis not present

## 2023-01-11 LAB — CMP (CANCER CENTER ONLY)
ALT: 27 U/L (ref 0–44)
AST: 19 U/L (ref 15–41)
Albumin: 4.1 g/dL (ref 3.5–5.0)
Alkaline Phosphatase: 70 U/L (ref 38–126)
Anion gap: 6 (ref 5–15)
BUN: 16 mg/dL (ref 8–23)
CO2: 26 mmol/L (ref 22–32)
Calcium: 8.5 mg/dL — ABNORMAL LOW (ref 8.9–10.3)
Chloride: 105 mmol/L (ref 98–111)
Creatinine: 0.6 mg/dL (ref 0.44–1.00)
GFR, Estimated: >60 mL/min (ref >60)
Glucose, Bld: 105 mg/dL — ABNORMAL HIGH (ref 70–99)
Potassium: 4 mmol/L (ref 3.5–5.1)
Sodium: 137 mmol/L (ref 135–145)
Total Bilirubin: 0.9 mg/dL (ref <1.2)
Total Protein: 6.6 g/dL (ref 6.5–8.1)

## 2023-01-11 LAB — CBC WITH DIFFERENTIAL/PLATELET
Abs Immature Granulocytes: 0.02 10*3/uL (ref 0.00–0.07)
Basophils Absolute: 0 10*3/uL (ref 0.0–0.1)
Basophils Relative: 1 %
Eosinophils Absolute: 0.1 10*3/uL (ref 0.0–0.5)
Eosinophils Relative: 1 %
HCT: 43.1 % (ref 36.0–46.0)
Hemoglobin: 14.5 g/dL (ref 12.0–15.0)
Immature Granulocytes: 0 %
Lymphocytes Relative: 14 %
Lymphs Abs: 1 10*3/uL (ref 0.7–4.0)
MCH: 30.5 pg (ref 26.0–34.0)
MCHC: 33.6 g/dL (ref 30.0–36.0)
MCV: 90.5 fL (ref 80.0–100.0)
Monocytes Absolute: 0.9 10*3/uL (ref 0.1–1.0)
Monocytes Relative: 12 %
Neutro Abs: 5.4 10*3/uL (ref 1.7–7.7)
Neutrophils Relative %: 72 %
Platelets: 186 10*3/uL (ref 150–400)
RBC: 4.76 MIL/uL (ref 3.87–5.11)
RDW: 13.2 % (ref 11.5–15.5)
WBC: 7.4 10*3/uL (ref 4.0–10.5)
nRBC: 0 % (ref 0.0–0.2)

## 2023-01-11 MED ORDER — ACETAMINOPHEN 325 MG PO TABS
650.0000 mg | ORAL_TABLET | Freq: Once | ORAL | Status: AC
Start: 2023-01-11 — End: 2023-01-11
  Administered 2023-01-11: 650 mg via ORAL
  Filled 2023-01-11: qty 2

## 2023-01-11 MED ORDER — DIPHENHYDRAMINE HCL 25 MG PO CAPS
50.0000 mg | ORAL_CAPSULE | Freq: Once | ORAL | Status: AC
  Administered 2023-01-11: 50 mg via ORAL
  Filled 2023-01-11: qty 2

## 2023-01-11 MED ORDER — SODIUM CHLORIDE 0.9 % IV SOLN
Freq: Once | INTRAVENOUS | Status: AC
Start: 2023-01-11 — End: 2023-01-11
  Filled 2023-01-11: qty 250

## 2023-01-11 MED ORDER — SODIUM CHLORIDE 0.9 % IV SOLN
375.0000 mg/m2 | Freq: Once | INTRAVENOUS | Status: AC
  Administered 2023-01-11: 700 mg via INTRAVENOUS
  Filled 2023-01-11: qty 50

## 2023-01-11 NOTE — Progress Notes (Signed)
[facility]  [phone] [phone]  ID: Melinda Hall OB: 1960/03/27  MR#: [id number]  AOZ#:[id number]  Patient Care Team: Eustaquio Boyden, MD as PCP - General (Family Medicine) Jeralyn Ruths, MD as Consulting Physician (Oncology) Lucy Chris, MD as Consulting Physician (Neurosurgery)  CHIEF COMPLAINT: Recurrent stage III follicular lymphoma, grade 1/2.    INTERVAL HISTORY: Patient returns to clinic today for further evaluation and continuation of maintenance Rituxan.  She continues to have occasional hot flashes and night sweats, but these are not bothersome and do not affect her day-to-day activity.  She otherwise feels well and is asymptomatic.  She continues to tolerate her treatment without significant side effects.  She denies any recent fevers or illnesses.  She has a good appetite and denies weight loss.  She has no neurologic complaints.  She does not complain of any weakness or fatigue today. She has no chest pain, shortness of breath, cough, or hemoptysis.  She denies any nausea, vomiting, constipation, or diarrhea.  She has no urinary complaints.  Patient offers no further specific complaints today.  REVIEW OF SYSTEMS:   Review of Systems  Constitutional:  Positive for diaphoresis. Negative for fever, malaise/fatigue and weight loss.  Respiratory: Negative.  Negative for cough, hemoptysis and shortness of breath.   Cardiovascular: Negative.  Negative for chest pain and leg swelling.  Gastrointestinal: Negative.  Negative for abdominal pain.  Genitourinary: Negative.  Negative for dysuria.  Musculoskeletal: Negative.  Negative for back pain and myalgias.  Skin: Negative.  Negative for rash.  Neurological:  Positive for sensory change. Negative for tingling, focal weakness, weakness and headaches.  Psychiatric/Behavioral: Negative.  Negative for depression. The patient is not nervous/anxious.     As per HPI. Otherwise, a complete  review of systems is negative.  PAST MEDICAL HISTORY: Past Medical History:  Diagnosis Date   Cancer (HCC)    Dermoid tumor    hx of--left ovary   Follicular lymphoma (HCC)    Gastric bypass status for obesity 2004   s/p roux-en-y   Generalized headaches    frequent   Heart murmur    History of cardiac murmur    Intraductal papilloma of breast, left 06/18/2017   05/2017 - s/p excision 07/2017   Osteoporosis 03/2016   DEXA -2.6 spine   Salivary gland stone    x 2   Smoker     PAST SURGICAL HISTORY: Past Surgical History:  Procedure Laterality Date   ABDOMINAL HYSTERECTOMY     only ovary was removed   ANTERIOR CERVICAL DECOMP/DISCECTOMY FUSION  12/2016   Pool   APPENDECTOMY  1980   BILATERAL OOPHORECTOMY  ~1992   for dermoid ovarian cysts   BREAST BIOPSY Left 06/13/2017   papilloma with excision 07/2017   BREAST LUMPECTOMY WITH NEEDLE LOCALIZATION Left 08/10/2017   intraductal papilloma - Sterling Big F, MD   CHOLECYSTECTOMY  2000s   COLONOSCOPY WITH PROPOFOL N/A 11/27/2015   TA rpt 5 yrs Midge Minium, MD)   COLONOSCOPY WITH PROPOFOL N/A 05/17/2021   TAs, SSPs, rpt 3 yrs Servando Snare, Darren, MD)   GASTRIC BYPASS  2004   roux en y [facility])   POLYPECTOMY  11/27/2015   Procedure: POLYPECTOMY;  Surgeon: Midge Minium, MD;  Location: [facility];  Service: Endoscopy;;   POLYPECTOMY  05/17/2021   Procedure: POLYPECTOMY;  Surgeon: Midge Minium, MD;  Location: [facility];  Service: Endoscopy;;   REDUCTION MAMMAPLASTY Bilateral 1990's   SALIVARY GLAND SURGERY  SALIVARY STONE REMOVAL      FAMILY HISTORY: Family History  Problem Relation Age of Onset   Hypertension Mother    Cancer Father 60       leukemia   Cancer Brother 52       kidney   Diabetes Neg Hx    Stroke Neg Hx    Coronary artery disease Neg Hx    Breast cancer Neg Hx     ADVANCED DIRECTIVES (Y/N):  N  HEALTH MAINTENANCE: Social History   Tobacco Use   Smoking status: Former    Current  packs/day: 0.00    Average packs/day: 0.3 packs/day for 20.0 years (5.0 ttl pk-yrs)    Types: Cigarettes    Start date: 10/31/1992    Quit date: 10/31/2012    Years since quitting: 10.2   Smokeless tobacco: Never  Vaping Use   Vaping status: Never Used  Substance Use Topics   Alcohol use: Not Currently   Drug use: No     Colonoscopy:  PAP:  Bone density:  Lipid panel:  Allergies  Allergen Reactions   Boniva [Ibandronic Acid] Other (See Comments)    Diffuse body aches    Current Outpatient Medications  Medication Sig Dispense Refill   buPROPion ER (WELLBUTRIN SR) 100 MG 12 hr tablet Take 1 tablet (100 mg total) by mouth in the morning. 90 tablet 4   calcium carbonate (OS-CAL) 600 MG TABS tablet Take 600 mg by mouth daily with breakfast.     Cholecalciferol (VITAMIN D3) 1000 units CAPS Take 3 capsules (3,000 Units total) by mouth daily.     ferrous sulfate 325 (65 FE) MG tablet Take 1 tablet (325 mg total) by mouth every Monday, Wednesday, and Friday.     hydrOXYzine (ATARAX) 25 MG tablet Take 0.5-1 tablets (12.5-25 mg total) by mouth 3 (three) times daily as needed. 30 tablet 0   Magnesium 250 MG TABS Take 1 tablet (250 mg total) by mouth daily.  0   naproxen (NAPROSYN) 500 MG tablet TAKE 1 TABLET (500 MG TOTAL) BY MOUTH 2 (TWO) TIMES DAILY WITH A MEAL. FOR 1 WEEK THEN AS NEEDED 40 tablet 0   nitroGLYCERIN (NITRODUR - DOSED IN MG/24 HR) 0.2 mg/hr patch Apply 1 patch to affected area as directed by MD and change every 24 hours. 30 patch 5   vitamin B-12 (CYANOCOBALAMIN) 1000 MCG tablet Take 1 tablet (1,000 mcg total) by mouth daily.     zinc gluconate 50 MG tablet Take 1 tablet (50 mg total) by mouth daily.     Current Facility-Administered Medications  Medication Dose Route Frequency Provider Last Rate Last Admin   [START ON 06/27/2023] denosumab (PROLIA) injection 60 mg  60 mg Subcutaneous Once Eustaquio Boyden, MD       Facility-Administered Medications Ordered in Other  Visits  Medication Dose Route Frequency Provider Last Rate Last Admin   0.9 %  sodium chloride infusion   Intravenous Once Jeralyn Ruths, MD       acetaminophen (TYLENOL) tablet 650 mg  650 mg Oral Once Jeralyn Ruths, MD       diphenhydrAMINE (BENADRYL) capsule 50 mg  50 mg Oral Once Jeralyn Ruths, MD       riTUXimab-pvvr (RUXIENCE) 700 mg in sodium chloride 0.9 % 180 mL infusion  375 mg/m2 (Treatment Plan Recorded) Intravenous Once Jeralyn Ruths, MD        OBJECTIVE: Vitals:   01/11/23 0853  BP: (!) 121/92  Pulse: 74  Resp: 16  Temp: 97.8 F (36.6 C)  SpO2: 96%     Body mass index is 29.18 kg/m.    ECOG FS:0 - Asymptomatic  General: Well-developed, well-nourished, no acute distress. Eyes: Pink conjunctiva, anicteric sclera. HEENT: Normocephalic, moist mucous membranes. Lungs: No audible wheezing or coughing. Heart: Regular rate and rhythm. Abdomen: Soft, nontender, no obvious distention. Musculoskeletal: No edema, cyanosis, or clubbing. Neuro: Alert, answering all questions appropriately. Cranial nerves grossly intact. Skin: No rashes or petechiae noted. Psych: Normal affect.  LAB RESULTS:  Lab Results  Component Value Date   NA 137 01/11/2023   K 4.0 01/11/2023   CL 105 01/11/2023   CO2 26 01/11/2023   GLUCOSE 105 (H) 01/11/2023   BUN 16 01/11/2023   CREATININE 0.60 01/11/2023   CALCIUM 8.5 (L) 01/11/2023   PROT 6.6 01/11/2023   ALBUMIN 4.1 01/11/2023   AST 19 01/11/2023   ALT 27 01/11/2023   ALKPHOS 70 01/11/2023   BILITOT 0.9 01/11/2023   GFRNONAA >60 01/11/2023   GFRAA >60 07/10/2013    Lab Results  Component Value Date   WBC 7.4 01/11/2023   NEUTROABS 5.4 01/11/2023   HGB 14.5 01/11/2023   HCT 43.1 01/11/2023   MCV 90.5 01/11/2023   PLT 186 01/11/2023     STUDIES: No results found.  ONCOLOGY HISTORY: Retroperitoneal lymph node biopsy on April 21, 2020 confirmed the diagnosis. PET scan on April 14, 2020 revealed a  hypermetabolic right sided retroperitoneal nodal mass along with hypermetabolic mesenteric adenopathy.  Patient also noted to have bilateral axillary adenopathy.  Although patient was asymptomatic and active surveillance was offered, she elected to pursue treatment and received IV Rituxan weekly x4 completing on May 28, 2020.  Repeat PET scan from August 24, 2020 reviewed independently with significant treatment response, although likely residual disease.  Patient previously declined maintenance Rituxan.   ASSESSMENT: Recurrent stage III follicular lymphoma, grade 1/2.    PLAN:    Recurrent stage III follicular lymphoma, grade 1/2:  Repeat imaging on March 15, 2021 reviewed independently with progression of disease.  Patient subsequently reinitiated treatment and received weekly Rituxan x 4 between March 31, 2021 and April 21, 2021.  She then initiated maintenance Rituxan every 8 weeks on Jun 23, 2021.  Will continue treatment every 8 weeks for up to 2 years completing in May 2025.  Repeat CT scan on October 21, 2022 reviewed independently and reported as above with continued improvement of disease burden.  Proceed with cycle 9 of maintenance Rituxan today.  Return to clinic in 8 weeks for further evaluation and consideration of cycle 10.   Hypermetabolic thyroid nodule: Previously, biopsy was negative for malignancy. Depression: Significantly improved.  Continue current medications as prescribed. Hot flashes: Intermittent.  Given improvement of disease burden, unlikely related to underlying malignancy.  I spent a total of 30 minutes reviewing chart data, face-to-face evaluation with the patient, counseling and coordination of care as detailed above.    Patient expressed understanding and was in agreement with this plan. She also understands that She can call clinic at any time with any questions, concerns, or complaints.    Cancer Staging  Follicular lymphoma (HCC) Staging form: Hodgkin and  Non-Hodgkin Lymphoma, AJCC 8th Edition - Clinical stage from 04/29/2020: Stage III (Follicular lymphoma) - Signed by Jeralyn Ruths, MD on 04/29/2020 Stage prefix: Initial diagnosis   Jeralyn Ruths, MD   01/11/2023 9:58 AM

## 2023-01-12 ENCOUNTER — Other Ambulatory Visit: Payer: Self-pay

## 2023-02-02 DIAGNOSIS — F32A Depression, unspecified: Secondary | ICD-10-CM | POA: Insufficient documentation

## 2023-02-10 ENCOUNTER — Encounter: Payer: Self-pay | Admitting: Oncology

## 2023-02-19 ENCOUNTER — Other Ambulatory Visit: Payer: Self-pay | Admitting: Family Medicine

## 2023-02-27 ENCOUNTER — Ambulatory Visit (INDEPENDENT_AMBULATORY_CARE_PROVIDER_SITE_OTHER): Payer: Medicare HMO | Admitting: Family Medicine

## 2023-02-27 ENCOUNTER — Encounter: Payer: Self-pay | Admitting: Family Medicine

## 2023-02-27 VITALS — BP 124/80 | HR 71 | Temp 98.1°F | Ht 64.0 in | Wt 173.0 lb

## 2023-02-27 DIAGNOSIS — R3915 Urgency of urination: Secondary | ICD-10-CM | POA: Diagnosis not present

## 2023-02-27 DIAGNOSIS — M7661 Achilles tendinitis, right leg: Secondary | ICD-10-CM

## 2023-02-27 DIAGNOSIS — F4323 Adjustment disorder with mixed anxiety and depressed mood: Secondary | ICD-10-CM | POA: Diagnosis not present

## 2023-02-27 LAB — POC URINALSYSI DIPSTICK (AUTOMATED)
Blood, UA: NEGATIVE
Glucose, UA: NEGATIVE
Ketones, UA: NEGATIVE
Leukocytes, UA: NEGATIVE
Nitrite, UA: NEGATIVE
Protein, UA: POSITIVE — AB
Spec Grav, UA: 1.025 (ref 1.010–1.025)
Urobilinogen, UA: 0.2 U/dL
pH, UA: 6 (ref 5.0–8.0)

## 2023-02-27 MED ORDER — BUPROPION HCL ER (SR) 150 MG PO TB12: 150.0000 mg | ORAL_TABLET | Freq: Every morning | ORAL | 0 refills | Status: DC

## 2023-02-27 NOTE — Addendum Note (Signed)
Addended by: Eustaquio Boyden on: 02/27/2023 10:20 AM   Modules accepted: Orders

## 2023-02-27 NOTE — Assessment & Plan Note (Addendum)
Seeing sports med - encouraged she schedule PT. Continue staying active at gym.

## 2023-02-27 NOTE — Assessment & Plan Note (Signed)
Overall stable period but still struggles with occ periods of depressed mood.  Will increase wellbutrin SR to 150mg  daily in am and reassess control at CPE - she will return for this.

## 2023-02-27 NOTE — Assessment & Plan Note (Addendum)
Ongoing urinary urgency with incomplete emptying after completing cefdinir antibiotic course for UTI treated at [facility]. I don't see where UCx was sent.  UA today overall reassuring - discussed with patient.  Update UCx given ongoing symptoms. She does not increased caffeine intake - will drop this and reassess control.

## 2023-02-27 NOTE — Progress Notes (Addendum)
Ph: [phone] Fax: [phone]   Patient ID: Melinda Hall, female    DOB: 1960-09-01, 63 y.o.   MRN: [id number]  This visit was conducted in person.  BP 124/80   Pulse 71   Temp 98.1 F (36.7 C) (Oral)   Ht 5\' 4"  (1.626 m)   Wt 173 lb (78.5 kg)   SpO2 98%   BMI 29.70 kg/m    CC: UTI  Subjective:   HPI: Melinda Hall is a 63 y.o. female presenting on 02/27/2023 for Urinary Urgency (C/o blood in urine and burning while urinating. Sxs started about 3 wks ago. Tried AZO, helped for awhile. Seen while traveling at [facility] in [place]- abx. Most sxs have have resolved but now c/o urinary urgency and does not feel bladder is completely emptying. )   3 wk h/o dysuria, blood in urine. Treated initially with azo and cranberry with benefit. Seen at [facility] in [place] with antibiotic course with overall improvement, but still notes some ongoing urinary urgency and incomplete bladder emptying.  She does note she has recently increased caffeine intake.   Initial chills and lower back and abdominal pain, but no fever, flank pain, nausea/vomiting.  No incontinence or noted pelvic prolapse.   UCC records reviewed - treated with Cefdinir 300mg  BID 7d course.   Saw Dr Patsy Lager for L achilles tendonitis 12/2022, referred to PT but has not done yet. She is also using stronger concentration nitroglycerin patch with benefit noted.   Last UTI 10/2022 treated with augmentin, UCx grew 50-100k Klebsiella.      Relevant past medical, surgical, family and social history reviewed and updated as indicated. Interim medical history since our last visit reviewed. Allergies and medications reviewed and updated. Outpatient Medications Prior to Visit  Medication Sig Dispense Refill   calcium carbonate (OS-CAL) 600 MG TABS tablet Take 600 mg by mouth daily with breakfast.     Cholecalciferol (VITAMIN D3) 1000 units CAPS Take 3 capsules (3,000 Units total) by mouth daily.     ferrous sulfate 325 (65 FE) MG  tablet Take 1 tablet (325 mg total) by mouth every Monday, Wednesday, and Friday.     hydrOXYzine (ATARAX) 25 MG tablet Take 0.5-1 tablets (12.5-25 mg total) by mouth 3 (three) times daily as needed. 30 tablet 0   Magnesium 250 MG TABS Take 1 tablet (250 mg total) by mouth daily.  0   naproxen (NAPROSYN) 500 MG tablet TAKE 1 TABLET (500 MG TOTAL) BY MOUTH 2 (TWO) TIMES DAILY WITH A MEAL. FOR 1 WEEK THEN AS NEEDED 40 tablet 0   nitroGLYCERIN (NITRODUR - DOSED IN MG/24 HR) 0.2 mg/hr patch Apply 1 patch to affected area as directed by MD and change every 24 hours. 30 patch 5   vitamin B-12 (CYANOCOBALAMIN) 1000 MCG tablet Take 1 tablet (1,000 mcg total) by mouth daily.     zinc gluconate 50 MG tablet Take 1 tablet (50 mg total) by mouth daily.     buPROPion ER (WELLBUTRIN SR) 100 MG 12 hr tablet Take 1 tablet (100 mg total) by mouth in the morning. 90 tablet 4   Facility-Administered Medications Prior to Visit  Medication Dose Route Frequency Provider Last Rate Last Admin   [START ON 06/27/2023] denosumab (PROLIA) injection 60 mg  60 mg Subcutaneous Once Eustaquio Boyden, MD         Per HPI unless specifically indicated in ROS section below Review of Systems  Objective:  BP 124/80   Pulse 71  Temp 98.1 F (36.7 C) (Oral)   Ht 5\' 4"  (1.626 m)   Wt 173 lb (78.5 kg)   SpO2 98%   BMI 29.70 kg/m   Wt Readings from Last 3 Encounters:  02/27/23 173 lb (78.5 kg)  01/11/23 170 lb (77.1 kg)  12/26/22 170 lb 4 oz (77.2 kg)      Physical Exam Vitals and nursing note reviewed.  Constitutional:      Appearance: Normal appearance. She is not ill-appearing.  HENT:     Mouth/Throat:     Mouth: Mucous membranes are moist.     Pharynx: No oropharyngeal exudate or posterior oropharyngeal erythema.  Eyes:     Extraocular Movements: Extraocular movements intact.     Pupils: Pupils are equal, round, and reactive to light.  Cardiovascular:     Rate and Rhythm: Normal rate and regular rhythm.      Pulses: Normal pulses.     Heart sounds: Normal heart sounds. No murmur heard. Abdominal:     General: Bowel sounds are normal. There is no distension.     Palpations: Abdomen is soft. There is no mass.     Tenderness: There is abdominal tenderness (mild) in the epigastric area and suprapubic area. There is no right CVA tenderness, left CVA tenderness, guarding or rebound. Negative signs include Murphy's sign.     Hernia: No hernia is present.  Musculoskeletal:     Cervical back: Normal range of motion and neck supple.     Right lower leg: No edema.     Left lower leg: No edema.  Skin:    General: Skin is warm and dry.     Findings: No rash.  Neurological:     Mental Status: She is alert.  Psychiatric:        Mood and Affect: Mood normal.        Behavior: Behavior normal.       Results for orders placed or performed in visit on 02/27/23  POCT Urinalysis Dipstick (Automated)   Collection Time: 02/27/23  9:35 AM  Result Value Ref Range   Color, UA dark yellow    Clarity, UA clear    Glucose, UA Negative Negative   Bilirubin, UA 1+    Ketones, UA negative    Spec Grav, UA 1.025 1.010 - 1.025   Blood, UA negative    pH, UA 6.0 5.0 - 8.0   Protein, UA Positive (A) Negative   Urobilinogen, UA 0.2 0.2 or 1.0 E.U./dL   Nitrite, UA negative    Leukocytes, UA Negative Negative    Assessment & Plan:   Problem List Items Addressed This Visit     Adjustment disorder with mixed anxiety and depressed mood   Overall stable period but still struggles with occ periods of depressed mood.  Will increase wellbutrin SR to 150mg  daily in am and reassess control at CPE - she will return for this.       Tendonitis, Achilles, right   Seeing sports med - encouraged she schedule PT. Continue staying active at gym.       Urinary urgency - Primary   Ongoing urinary urgency with incomplete emptying after completing cefdinir antibiotic course for UTI treated at [facility]. I don't see where UCx was  sent.  UA today overall reassuring - discussed with patient.  Update UCx given ongoing symptoms. She does not increased caffeine intake - will drop this and reassess control.       Relevant Orders   POCT  Urinalysis Dipstick (Automated) (Completed)   Urine Culture     Meds ordered this encounter  Medications   buPROPion ER (WELLBUTRIN SR) 150 MG 12 hr tablet    Sig: Take 1 tablet (150 mg total) by mouth in the morning.    Dispense:  90 tablet    Refill:  0    Orders Placed This Encounter  Procedures   Urine Culture   POCT Urinalysis Dipstick (Automated)    Patient Instructions  Regresar en 2-3 meses para fisico Subamos dosis de wellbutrin a 150mg . Orina se ve por lo general normal hoy.  He mandado cultivo de [place]. Siga agua, limite irritantes de vejiga como sodas, cafeina, comida picante.   Follow up plan: No follow-ups on file.  Eustaquio Boyden, MD

## 2023-02-27 NOTE — Addendum Note (Signed)
Addended by: Eustaquio Boyden on: 02/27/2023 10:20 AM   Modules accepted: Level of Service

## 2023-02-27 NOTE — Patient Instructions (Addendum)
Regresar en 2-3 meses para fisico Subamos dosis de wellbutrin a 150mg . Orina se ve por lo general normal hoy.  He mandado cultivo de [place]. Siga agua, limite irritantes de vejiga como sodas, cafeina, comida picante.

## 2023-02-28 ENCOUNTER — Other Ambulatory Visit: Payer: Self-pay

## 2023-02-28 DIAGNOSIS — H524 Presbyopia: Secondary | ICD-10-CM | POA: Diagnosis not present

## 2023-02-28 DIAGNOSIS — H5213 Myopia, bilateral: Secondary | ICD-10-CM | POA: Diagnosis not present

## 2023-02-28 DIAGNOSIS — H52223 Regular astigmatism, bilateral: Secondary | ICD-10-CM | POA: Diagnosis not present

## 2023-02-28 LAB — URINE CULTURE
MICRO NUMBER:: 16001704
SPECIMEN QUALITY:: ADEQUATE

## 2023-03-03 ENCOUNTER — Encounter: Payer: Self-pay | Admitting: Family Medicine

## 2023-03-08 ENCOUNTER — Inpatient Hospital Stay: Payer: Medicare HMO | Attending: Oncology

## 2023-03-08 ENCOUNTER — Encounter: Payer: Self-pay | Admitting: Oncology

## 2023-03-08 ENCOUNTER — Inpatient Hospital Stay (HOSPITAL_BASED_OUTPATIENT_CLINIC_OR_DEPARTMENT_OTHER): Payer: Medicare HMO | Admitting: Oncology

## 2023-03-08 ENCOUNTER — Inpatient Hospital Stay: Payer: Medicare HMO

## 2023-03-08 VITALS — BP 119/87 | HR 64 | Temp 97.0°F | Resp 17

## 2023-03-08 VITALS — BP 128/94 | HR 72 | Temp 97.7°F | Resp 16 | Ht 64.0 in | Wt 169.2 lb

## 2023-03-08 DIAGNOSIS — Z87891 Personal history of nicotine dependence: Secondary | ICD-10-CM | POA: Diagnosis not present

## 2023-03-08 DIAGNOSIS — C8218 Follicular lymphoma grade II, lymph nodes of multiple sites: Secondary | ICD-10-CM

## 2023-03-08 DIAGNOSIS — Z79620 Long term (current) use of immunosuppressive biologic: Secondary | ICD-10-CM | POA: Diagnosis not present

## 2023-03-08 DIAGNOSIS — C8213 Follicular lymphoma grade II, intra-abdominal lymph nodes: Secondary | ICD-10-CM | POA: Diagnosis present

## 2023-03-08 DIAGNOSIS — Z5112 Encounter for antineoplastic immunotherapy: Secondary | ICD-10-CM | POA: Diagnosis present

## 2023-03-08 LAB — CBC WITH DIFFERENTIAL/PLATELET
Abs Immature Granulocytes: 0.03 10*3/uL (ref 0.00–0.07)
Basophils Absolute: 0 10*3/uL (ref 0.0–0.1)
Basophils Relative: 1 %
Eosinophils Absolute: 0.1 10*3/uL (ref 0.0–0.5)
Eosinophils Relative: 2 %
HCT: 44 % (ref 36.0–46.0)
Hemoglobin: 14.5 g/dL (ref 12.0–15.0)
Immature Granulocytes: 0 %
Lymphocytes Relative: 21 %
Lymphs Abs: 1.5 10*3/uL (ref 0.7–4.0)
MCH: 29.3 pg (ref 26.0–34.0)
MCHC: 33 g/dL (ref 30.0–36.0)
MCV: 88.9 fL (ref 80.0–100.0)
Monocytes Absolute: 0.5 10*3/uL (ref 0.1–1.0)
Monocytes Relative: 7 %
Neutro Abs: 4.8 10*3/uL (ref 1.7–7.7)
Neutrophils Relative %: 69 %
Platelets: 211 10*3/uL (ref 150–400)
RBC: 4.95 MIL/uL (ref 3.87–5.11)
RDW: 14.2 % (ref 11.5–15.5)
WBC: 7 10*3/uL (ref 4.0–10.5)
nRBC: 0 % (ref 0.0–0.2)

## 2023-03-08 MED ORDER — SODIUM CHLORIDE 0.9 % IV SOLN
Freq: Once | INTRAVENOUS | Status: AC
Start: 2023-03-08 — End: 2023-03-08
  Filled 2023-03-08: qty 250

## 2023-03-08 MED ORDER — DIPHENHYDRAMINE HCL 25 MG PO CAPS
50.0000 mg | ORAL_CAPSULE | Freq: Once | ORAL | Status: AC
  Administered 2023-03-08: 50 mg via ORAL
  Filled 2023-03-08: qty 2

## 2023-03-08 MED ORDER — RITUXIMAB-PVVR CHEMO 500 MG/50ML IV SOLN
375.0000 mg/m2 | Freq: Once | INTRAVENOUS | Status: AC
  Administered 2023-03-08: 700 mg via INTRAVENOUS
  Filled 2023-03-08: qty 70

## 2023-03-08 MED ORDER — ACETAMINOPHEN 325 MG PO TABS
650.0000 mg | ORAL_TABLET | Freq: Once | ORAL | Status: AC
  Administered 2023-03-08: 650 mg via ORAL
  Filled 2023-03-08: qty 2

## 2023-03-08 NOTE — Progress Notes (Signed)
 Melinda Schlender T. Trayon Krantz, MD, CAQ Sports Medicine [facility] [address] [place], [address]  Phone: [phone]  FAX: [phone]  Melinda Hall - 63 y.o. female  MRN [id number]  Date of Birth: 1960-08-04  Date: 03/09/2023  PCP: Rilla Baller, MD  Referral: Rilla Baller, MD  Chief Complaint  Patient presents with   Tendonitis    Left Heel   Shoulder Pain    Left   Subjective:   Melinda Hall is a 63 y.o. very pleasant female patient with Body mass index is 28.75 kg/m. who presents with the following:  She is a very pleasant lady, she presents today for follow-up with some left-sided Achilles tendinopathy.  I initially saw her 09/2022.  Last time I saw her in November 2024, she was still struggling with a lot of pain in the left Achilles.  I did give her a orthopedic felt lift for the shoe, and additionally will have her continue with the Tuli's heel cup.  She has also been on a full patch of nitroglycerin , and her last office visit we started her on formal physical therapy.  Achilles is doing much better. Maybe occ with a step.   Out of nowhere, tried to move and now has some pain in the L shoulder.  Not terrible and pain with abduction.   She is primarily having pain with terminal abduction.  To a lesser extent, she is having some pain with terminal internal range of motion.  She does not have any specific injury, and she denies any neck pain, shoulder blade pain, numbness, tingling or any form of radicular pain.  [employer] every morning -she generally does workout every morning.  She has noticed that certain movements such as pressing movements are little bit more uncomfortable.  Review of Systems is noted in the HPI, as appropriate  Objective:   BP 133/77 (BP Location: Left Arm, Patient Position: Sitting, Cuff Size: Normal)   Pulse 72   Temp 97.9 F (36.6 C) (Temporal)   Ht 5' 4 (1.626 m)   Wt 167 lb 8 oz (76 kg)   SpO2 96%    BMI 28.75 kg/m   GEN: No acute distress; alert,appropriate. PULM: Breathing comfortably in no respiratory distress PSYCH: Normally interactive.   FEET: B Echymosis: no Edema: no ROM: full LE B Gait: heel toe, non-antalgic MT pain: no Callus pattern: none Lateral Mall: NT Medial Mall: NT Talus: NT Navicular: NT Cuboid: NT Calcaneous: NT Metatarsals: NT 5th MT: NT Phalanges: NT Achilles: NT, the nodule has resolved. Plantar Fascia: NT Fat Pad: NT Peroneals: NT Post Tib: NT Great Toe: Nml motion Ant Drawer: neg ATFL: NT CFL: NT Deltoid: NT Other foot breakdown: none Long arch: preserved Transverse arch: preserved Hindfoot breakdown: none Sensation: intact   Shoulder: L Inspection: No muscle wasting or winging Ecchymosis/edema: neg  AC joint, scapula, clavicle: NT Cervical spine: NT, full ROM Spurling's: neg Abduction: full, 5/5 Flexion: full, 5/5 IR, full, lift-off: 5/5 ER at neutral: full, 5/5 AC crossover: neg Neer: pos Hawkins: pos Drop Test: neg Jobe: pos Supraspinatus insertion: mild-mod T Bicipital groove: NT Speed's: neg Yergason's: neg Sulcus sign: neg Scapular dyskinesis: none C5-T1 intact  Neuro: Sensation intact Grip 5/5   Laboratory and Imaging Data:  Assessment and Plan:     ICD-10-CM   1. Tendonitis, Achilles, left  M76.62     2. Impingement syndrome of left shoulder  M75.42      Thankfully, the patient's Achilles tendinitis  is essentially gone and cured.  I do think she is having some mild impingement on the left shoulder.  I did give her a throw[employer] program that she will work on home and talk about something she could alter in the gym.  Disposition: prn  [employer] speech-to-text software was used for transcription in this dictation.  Possible transcriptional errors can occur using Animal nutritionist.   Signed,  Melinda DASEN. Melinda Skalsky, MD   Outpatient Encounter Medications as of 03/09/2023  Medication Sig    buPROPion  ER (WELLBUTRIN  SR) 150 MG 12 hr tablet Take 1 tablet (150 mg total) by mouth in the morning.   calcium carbonate (OS-CAL) 600 MG TABS tablet Take 600 mg by mouth daily with breakfast.   Cholecalciferol  (VITAMIN D3) 1000 units CAPS Take 3 capsules (3,000 Units total) by mouth daily.   ferrous sulfate  325 (65 FE) MG tablet Take 1 tablet (325 mg total) by mouth every Monday, Wednesday, and Friday.   hydrOXYzine  (ATARAX ) 25 MG tablet Take 0.5-1 tablets (12.5-25 mg total) by mouth 3 (three) times daily as needed.   Magnesium  250 MG TABS Take 1 tablet (250 mg total) by mouth daily.   naproxen  (NAPROSYN ) 500 MG tablet TAKE 1 TABLET (500 MG TOTAL) BY MOUTH 2 (TWO) TIMES DAILY WITH A MEAL. FOR 1 WEEK THEN AS NEEDED   nitroGLYCERIN  (NITRODUR - DOSED IN MG/24 HR) 0.2 mg/hr patch Apply 1 patch to affected area as directed by MD and change every 24 hours.   vitamin B-12 (CYANOCOBALAMIN ) 1000 MCG tablet Take 1 tablet (1,000 mcg total) by mouth daily.   zinc  gluconate 50 MG tablet Take 1 tablet (50 mg total) by mouth daily.   Facility-Administered Encounter Medications as of 03/09/2023  Medication   [START ON 06/27/2023] denosumab  (PROLIA ) injection 60 mg

## 2023-03-08 NOTE — Progress Notes (Signed)
 [facility]  [phone] [phone]  ID: Melinda Hall OB: 1960-06-20  MR#: [id number]  RDW#:[id number]  Patient Care Team: Rilla Baller, MD as PCP - General (Family Medicine) Jacobo Evalene PARAS, MD as Consulting Physician (Oncology) Bluford Standing, MD as Consulting Physician (Neurosurgery)  CHIEF COMPLAINT: Recurrent stage III follicular lymphoma, grade 1/2.    INTERVAL HISTORY: Patient returns to clinic today for further evaluation and continuation of maintenance Rituxan .  She has occasional hot flashes and night sweats, but these did not affect her day-to-day activity.  She otherwise feels well and is to rating her treatments without significant side effects.  She denies any recent fevers or illnesses.  She has a good appetite and denies weight loss.  She has no neurologic complaints.  She does not complain of any weakness or fatigue today. She has no chest pain, shortness of breath, cough, or hemoptysis.  She denies any nausea, vomiting, constipation, or diarrhea.  She has no urinary complaints.  Patient offers no further specific complaints today.  REVIEW OF SYSTEMS:   Review of Systems  Constitutional:  Positive for diaphoresis. Negative for fever, malaise/fatigue and weight loss.  Respiratory: Negative.  Negative for cough, hemoptysis and shortness of breath.   Cardiovascular: Negative.  Negative for chest pain and leg swelling.  Gastrointestinal: Negative.  Negative for abdominal pain.  Genitourinary: Negative.  Negative for dysuria.  Musculoskeletal: Negative.  Negative for back pain and myalgias.  Skin: Negative.  Negative for rash.  Neurological:  Positive for sensory change. Negative for tingling, focal weakness, weakness and headaches.  Psychiatric/Behavioral: Negative.  Negative for depression. The patient is not nervous/anxious.     As per HPI. Otherwise, a complete review of systems is negative.  PAST MEDICAL HISTORY: Past  Medical History:  Diagnosis Date   Cancer (HCC)    Dermoid tumor    hx of--left ovary   Follicular lymphoma (HCC)    Gastric bypass status for obesity 2004   s/p roux-en-y   Generalized headaches    frequent   Heart murmur    History of cardiac murmur    Intraductal papilloma of breast, left 06/18/2017   05/2017 - s/p excision 07/2017   Osteoporosis 03/2016   DEXA -2.6 spine   Salivary gland stone    x 2   Smoker     PAST SURGICAL HISTORY: Past Surgical History:  Procedure Laterality Date   ABDOMINAL HYSTERECTOMY     only ovary was removed   ANTERIOR CERVICAL DECOMP/DISCECTOMY FUSION  12/2016   Pool   APPENDECTOMY  1980   BILATERAL OOPHORECTOMY  ~1992   for dermoid ovarian cysts   BREAST BIOPSY Left 06/13/2017   papilloma with excision 07/2017   BREAST LUMPECTOMY WITH NEEDLE LOCALIZATION Left 08/10/2017   intraductal papilloma - Jordis Laneta FALCON, MD   CHOLECYSTECTOMY  2000s   COLONOSCOPY WITH PROPOFOL  N/A 11/27/2015   TA rpt 5 yrs Clair Copping, MD)   COLONOSCOPY WITH PROPOFOL  N/A 05/17/2021   TAs, SSPs, rpt 3 yrs Renne, Darren, MD)   GASTRIC BYPASS  2004   roux en y [facility])   POLYPECTOMY  11/27/2015   Procedure: POLYPECTOMY;  Surgeon: Rogelia Copping, MD;  Location[facility];  Service: Endoscopy;;   POLYPECTOMY  05/17/2021   Procedure: POLYPECTOMY;  Surgeon: Copping Rogelia, MD;  Location: [facility];  Service: Endoscopy;;   REDUCTION MAMMAPLASTY Bilateral 1990's   SALIVARY GLAND SURGERY     SALIVARY STONE REMOVAL      FAMILY  HISTORY: Family History  Problem Relation Age of Onset   Hypertension Mother    Cancer Father 69       leukemia   Cancer Brother 20       kidney   Diabetes Neg Hx    Stroke Neg Hx    Coronary artery disease Neg Hx    Breast cancer Neg Hx     ADVANCED DIRECTIVES (Y/N):  N  HEALTH MAINTENANCE: Social History   Tobacco Use   Smoking status: Former    Current packs/day: 0.00    Average packs/day: 0.3 packs/day for 20.0  years (5.0 ttl pk-yrs)    Types: Cigarettes    Start date: 10/31/1992    Quit date: 10/31/2012    Years since quitting: 10.3   Smokeless tobacco: Never  Vaping Use   Vaping status: Never Used  Substance Use Topics   Alcohol use: Not Currently   Drug use: No     Colonoscopy:  PAP:  Bone density:  Lipid panel:  Allergies  Allergen Reactions   Boniva [Ibandronic Acid] Other (See Comments)    Diffuse body aches    Current Outpatient Medications  Medication Sig Dispense Refill   buPROPion  ER (WELLBUTRIN  SR) 150 MG 12 hr tablet Take 1 tablet (150 mg total) by mouth in the morning. 90 tablet 0   calcium carbonate (OS-CAL) 600 MG TABS tablet Take 600 mg by mouth daily with breakfast.     Cholecalciferol  (VITAMIN D3) 1000 units CAPS Take 3 capsules (3,000 Units total) by mouth daily.     ferrous sulfate  325 (65 FE) MG tablet Take 1 tablet (325 mg total) by mouth every Monday, Wednesday, and Friday.     hydrOXYzine  (ATARAX ) 25 MG tablet Take 0.5-1 tablets (12.5-25 mg total) by mouth 3 (three) times daily as needed. 30 tablet 0   Magnesium  250 MG TABS Take 1 tablet (250 mg total) by mouth daily.  0   naproxen  (NAPROSYN ) 500 MG tablet TAKE 1 TABLET (500 MG TOTAL) BY MOUTH 2 (TWO) TIMES DAILY WITH A MEAL. FOR 1 WEEK THEN AS NEEDED 40 tablet 0   nitroGLYCERIN  (NITRODUR - DOSED IN MG/24 HR) 0.2 mg/hr patch Apply 1 patch to affected area as directed by MD and change every 24 hours. 30 patch 5   vitamin B-12 (CYANOCOBALAMIN ) 1000 MCG tablet Take 1 tablet (1,000 mcg total) by mouth daily.     zinc  gluconate 50 MG tablet Take 1 tablet (50 mg total) by mouth daily.     Current Facility-Administered Medications  Medication Dose Route Frequency Provider Last Rate Last Admin   [START ON 06/27/2023] denosumab  (PROLIA ) injection 60 mg  60 mg Subcutaneous Once Gutierrez, Javier, MD       Facility-Administered Medications Ordered in Other Visits  Medication Dose Route Frequency Provider Last Rate Last  Admin   riTUXimab -pvvr (RUXIENCE ) 700 mg in sodium chloride  0.9 % 180 mL infusion  375 mg/m2 (Treatment Plan Recorded) Intravenous Once Marsheila Alejo J, MD        OBJECTIVE: Vitals:   03/08/23 0847  BP: (!) 128/94  Pulse: 72  Resp: 16  Temp: 97.7 F (36.5 C)  SpO2: 99%     Body mass index is 29.04 kg/m.    ECOG FS:0 - Asymptomatic  General: Well-developed, well-nourished, no acute distress. Eyes: Pink conjunctiva, anicteric sclera. HEENT: Normocephalic, moist mucous membranes. Lungs: No audible wheezing or coughing. Heart: Regular rate and rhythm. Abdomen: Soft, nontender, no obvious distention. Musculoskeletal: No edema, cyanosis, or clubbing.  Neuro: Alert, answering all questions appropriately. Cranial nerves grossly intact. Skin: No rashes or petechiae noted. Psych: Normal affect.  LAB RESULTS:  Lab Results  Component Value Date   NA 137 01/11/2023   K 4.0 01/11/2023   CL 105 01/11/2023   CO2 26 01/11/2023   GLUCOSE 105 (H) 01/11/2023   BUN 16 01/11/2023   CREATININE 0.60 01/11/2023   CALCIUM 8.5 (L) 01/11/2023   PROT 6.6 01/11/2023   ALBUMIN 4.1 01/11/2023   AST 19 01/11/2023   ALT 27 01/11/2023   ALKPHOS 70 01/11/2023   BILITOT 0.9 01/11/2023   GFRNONAA >60 01/11/2023   GFRAA >60 07/10/2013    Lab Results  Component Value Date   WBC 7.0 03/08/2023   NEUTROABS 4.8 03/08/2023   HGB 14.5 03/08/2023   HCT 44.0 03/08/2023   MCV 88.9 03/08/2023   PLT 211 03/08/2023     STUDIES: No results found.  ONCOLOGY HISTORY: Retroperitoneal lymph node biopsy on April 21, 2020 confirmed the diagnosis. PET scan on April 14, 2020 revealed a hypermetabolic right sided retroperitoneal nodal mass along with hypermetabolic mesenteric adenopathy.  Patient also noted to have bilateral axillary adenopathy.  Although patient was asymptomatic and active surveillance was offered, she elected to pursue treatment and received IV Rituxan  weekly x4 completing on May 28, 2020.   Repeat PET scan from August 24, 2020 reviewed independently with significant treatment response, although likely residual disease.  Patient previously declined maintenance Rituxan .   ASSESSMENT: Recurrent stage III follicular lymphoma, grade 1/2.    PLAN:    Recurrent stage III follicular lymphoma, grade 1/2:  Repeat imaging on March 15, 2021 reviewed independently with progression of disease.  Patient subsequently reinitiated treatment and received weekly Rituxan  x 4 between March 31, 2021 and April 21, 2021.  She then initiated maintenance Rituxan  every 8 weeks on Jun 23, 2021.  Will continue treatment every 8 weeks for up to 2 years completing in May 2025.  Repeat CT scan on October 21, 2022 reviewed independently with continued improvement of disease burden.  Proceed with patient's next infusion of maintenance Rituxan  today.  Return to clinic in 8 weeks for further evaluation and consideration of her final treatment.  Will reimage approximately 3 months after completing her maintenance regimen. Hypermetabolic thyroid  nodule: Previously, biopsy was negative for malignancy. Depression: Significantly improved.  Continue current medications as prescribed. Hot flashes: Intermittent.  Given improvement of disease burden, unlikely related to underlying malignancy.  I spent a total of 30 minutes reviewing chart data, face-to-face evaluation with the patient, counseling and coordination of care as detailed above.   Patient expressed understanding and was in agreement with this plan. She also understands that She can call clinic at any time with any questions, concerns, or complaints.    Cancer Staging  Follicular lymphoma (HCC) Staging form: Hodgkin and Non-Hodgkin Lymphoma, AJCC 8th Edition - Clinical stage from 04/29/2020: Stage III (Follicular lymphoma) - Signed by Jacobo Evalene PARAS, MD on 04/29/2020 Stage prefix: Initial diagnosis   Evalene PARAS Jacobo, MD   03/08/2023 10:14 AM

## 2023-03-08 NOTE — Patient Instructions (Signed)
 Instrucciones al darle de alta: Discharge Instructions Gracias por elegir al [facility] de Cncer de [place] para brindarle atencin mdica de oncologa y Teacher, English as a foreign language.   Si usted tiene una cita de laboratorio con [employer] de [place], por favor vaya directamente al [employer] de Cncer y regstrese en el rea de Engineer, maintenance (IT).   Use ropa cmoda y [place] para tener fcil acceso a las vas del Portacath (acceso venoso de Set designer duracin) o la lnea PICC (catter central colocado por va perifrica).   Nos esforzamos por ofrecerle tiempo de calidad con su proveedor. Es posible que tenga que volver a programar su cita si llega tarde (15 minutos o ms).  El llegar tarde le afecta a usted y a otros pacientes cuyas citas son posteriores a Armed forces operational officer.  Adems, si usted falta a tres o ms citas sin avisar a la oficina, puede ser retirado(a) de la clnica a discrecin del proveedor.      Para las solicitudes de renovacin de recetas, pida a su farmacia que se ponga en contacto con nuestra oficina y deje que transcurran 72 horas para que se complete el proceso de las renovaciones.    Hoy usted recibi los siguientes agentes de quimioterapia e/o inmunoterapia Ruxience      Para ayudar a prevenir las nuseas y los vmitos despus de su tratamiento, le recomendamos que tome su medicamento para las nuseas segn las indicaciones.  LOS SNTOMAS QUE DEBEN COMUNICARSE INMEDIATAMENTE SE INDICAN A CONTINUACIN: *FIEBRE SUPERIOR A 100.4 F (38 C) O MS *ESCALOFROS O SUDORACIN *NUSEAS Y VMITOS QUE NO SE CONTROLAN CON EL MEDICAMENTO PARA LAS NUSEAS *DIFICULTAD INUSUAL PARA RESPIRAR  *MORETONES O HEMORRAGIAS NO HABITUALES *PROBLEMAS URINARIOS (dolor o ardor al Geographical information systems officer o frecuencia para Geographical information systems officer) *PROBLEMAS INTESTINALES (diarrea inusual, estreimiento, dolor cerca del ano) SENSIBILIDAD EN LA BOCA Y EN LA GARGANTA CON O SIN LA PRESENCIA DE LCERAS (dolor de garganta, llagas en la boca o dolor de muelas/dientes) ERUPCIN,  HINCHAZN O DOLORES INUSUALES FLUJO VAGINAL INUSUAL O PICAZN/RASQUIA    Los puntos marcados con un asterisco ( *) indican una posible emergencia y debe hacer un seguimiento tan pronto como le sea posible o vaya al Departamento de Emergencias si se le presenta algn problema.  Por favor, muestre la [place] DE ADVERTENCIA DE Marc Morgans DE ADVERTENCIA DE Gardiner Fanti al registrarse en [address] de Emergencias y a la enfermera de triaje.  Si tiene preguntas despus de su visita o necesita cancelar o volver a programar su cita, por favor pngase en contacto con [facility] - A DEPT OF Eligha Bridegroom [facility]  [phone]  y siga las instrucciones. Las horas de oficina son de 8:00 a.m. a 4:30 p.m. de lunes a viernes. Por favor, tenga en cuenta que los mensajes de voz que se dejan despus de las 4:00 p.m. posiblemente no se devolvern hasta el siguiente da de [place].  Cerramos los fines de semana y [employer]. En todo momento tiene acceso a una enfermera para preguntas urgentes. Por favor, llame al nmero principal de la clnica  [phone] y siga las instrucciones.   Para cualquier pregunta que no sea de carcter urgente, tambin puede ponerse en contacto con su proveedor [employer]. Ahora ofrecemos visitas electrnicas para cualquier persona mayor de 18 aos que solicite atencin mdica en lnea para los sntomas que no sean urgentes. Para ms detalles vaya a [email or web address].   Tambin puede bajar la aplicacin de MyChart! Vaya a la  tienda de aplicaciones, busque "MyChart", abra la aplicacin, seleccione [place], e ingrese con su nombre de usuario y la contrasea de Clinical cytogeneticist.

## 2023-03-09 ENCOUNTER — Ambulatory Visit (INDEPENDENT_AMBULATORY_CARE_PROVIDER_SITE_OTHER): Payer: Medicare HMO | Admitting: Family Medicine

## 2023-03-09 ENCOUNTER — Other Ambulatory Visit: Payer: Self-pay

## 2023-03-09 VITALS — BP 133/77 | HR 72 | Temp 97.9°F | Ht 64.0 in | Wt 167.5 lb

## 2023-03-09 DIAGNOSIS — M7662 Achilles tendinitis, left leg: Secondary | ICD-10-CM | POA: Diagnosis not present

## 2023-03-09 DIAGNOSIS — M7542 Impingement syndrome of left shoulder: Secondary | ICD-10-CM | POA: Diagnosis not present

## 2023-03-10 ENCOUNTER — Other Ambulatory Visit: Payer: Self-pay

## 2023-03-10 ENCOUNTER — Encounter: Payer: Self-pay | Admitting: Family Medicine

## 2023-04-06 ENCOUNTER — Ambulatory Visit: Payer: Medicare HMO | Admitting: Obstetrics and Gynecology

## 2023-04-06 ENCOUNTER — Other Ambulatory Visit (HOSPITAL_COMMUNITY)
Admission: RE | Admit: 2023-04-06 | Discharge: 2023-04-06 | Disposition: A | Discharge status: Home / Self Care | Source: Ambulatory Visit | Attending: Obstetrics and Gynecology | Admitting: Obstetrics and Gynecology

## 2023-04-06 ENCOUNTER — Encounter: Payer: Self-pay | Admitting: Obstetrics and Gynecology

## 2023-04-06 VITALS — BP 124/82 | HR 72 | Ht 64.0 in | Wt 170.0 lb

## 2023-04-06 DIAGNOSIS — N952 Postmenopausal atrophic vaginitis: Secondary | ICD-10-CM | POA: Diagnosis not present

## 2023-04-06 DIAGNOSIS — Z01419 Encounter for gynecological examination (general) (routine) without abnormal findings: Secondary | ICD-10-CM | POA: Insufficient documentation

## 2023-04-06 DIAGNOSIS — L738 Other specified follicular disorders: Secondary | ICD-10-CM

## 2023-04-06 DIAGNOSIS — Z124 Encounter for screening for malignant neoplasm of cervix: Secondary | ICD-10-CM

## 2023-04-06 DIAGNOSIS — Z1151 Encounter for screening for human papillomavirus (HPV): Secondary | ICD-10-CM | POA: Insufficient documentation

## 2023-04-06 DIAGNOSIS — N941 Unspecified dyspareunia: Secondary | ICD-10-CM

## 2023-04-06 MED ORDER — ESTRADIOL 0.1 MG/GM VA CREA: TOPICAL_CREAM | VAGINAL | 1 refills | Status: AC

## 2023-04-06 NOTE — Progress Notes (Signed)
 Eustaquio Boyden, MD   Chief Complaint  Patient presents with   Gynecologic Exam    Vaginal dryness and abnormal odor, vaginal rash/lumps    HPI:      Ms. Melinda Hall is a 63 y.o. G0P0000 whose LMP was No LMP recorded. Patient has had a hysterectomy., presents today for NP> 3 yrs eval of vaginal dryness/dyspareunia for 8-9 months. Occas has mild odor, no increased vag d/c. Has tried vaseline as lubricant but still painful, occas scant bleeding with sex due to pain. No hx of breast cancer/DVTs. Pt also notices rough bumps on labia.  S/p supracx hyst with ovary removed due to dermoid cyst. No recent pap. No PMB.  Current on mammo/colonoscopy/labs. Completing lymphoma tx.   NEEDS SPANISH INTERPRETER (does understand most English)  Patient Active Problem List   Diagnosis Date Noted   Urinary urgency 02/27/2023   Insomnia 10/04/2022   Tendonitis, Achilles, right 07/23/2022   Right knee injury, initial encounter 05/04/2022   Skin lesion of right leg 07/19/2021   Situational depression 06/21/2021   History of colonic polyps    Polyp of transverse colon    Chest discomfort 02/24/2021   Multiple thyroid nodules 05/17/2020   Follicular lymphoma (HCC) 04/29/2020   Chronic pain of left knee 09/11/2019   Bilateral hip pain 09/11/2019   Vaginal dryness, menopausal 06/13/2017   Left lumbar radiculopathy 07/28/2016   Anisocoria 07/28/2016   Osteoporosis 03/03/2016   Polyp of sigmoid colon    Disorder of intervertebral disc at C4-C5 level with radiculopathy 10/02/2015   Adjustment disorder with mixed anxiety and depressed mood 07/31/2014   Bilateral carpal tunnel syndrome 05/01/2014   Overweight (BMI 25.0-29.9) 06/06/2013   Palpitations 10/29/2012   Iron deficiency 02/28/2012   Status post gastric bypass for obesity 07/13/2011   Ex-smoker 07/13/2011   Encounter for general adult medical examination with abnormal findings 07/13/2011    Past Surgical History:  Procedure  Laterality Date   ABDOMINAL HYSTERECTOMY     only ovary was removed; supracx   ANTERIOR CERVICAL DECOMP/DISCECTOMY FUSION  12/2016   Pool   APPENDECTOMY  1980   BILATERAL OOPHORECTOMY  ~1992   for dermoid ovarian cysts   BREAST BIOPSY Left 06/13/2017   papilloma with excision 07/2017   BREAST LUMPECTOMY WITH NEEDLE LOCALIZATION Left 08/10/2017   intraductal papilloma - Pabon, Diego F, MD   CHOLECYSTECTOMY  2000s   COLONOSCOPY WITH PROPOFOL N/A 11/27/2015   TA rpt 5 yrs Midge Minium, MD)   COLONOSCOPY WITH PROPOFOL N/A 05/17/2021   TAs, SSPs, rpt 3 yrs Servando Snare, Darren, MD)   GASTRIC BYPASS  2004   roux en y [facility])   POLYPECTOMY  11/27/2015   Procedure: POLYPECTOMY;  Surgeon: Midge Minium, MD;  Location: [facility];  Service: Endoscopy;;   POLYPECTOMY  05/17/2021   Procedure: POLYPECTOMY;  Surgeon: Midge Minium, MD;  Location: [facility];  Service: Endoscopy;;   REDUCTION MAMMAPLASTY Bilateral 1990's   SALIVARY GLAND SURGERY     SALIVARY STONE REMOVAL      Family History  Problem Relation Age of Onset   Hypertension Mother    Cancer Father 63       leukemia   Cancer Brother 24       kidney   Diabetes Neg Hx    Stroke Neg Hx    Coronary artery disease Neg Hx    Breast cancer Neg Hx     Social History   Socioeconomic History   Marital status:  Married    Spouse name: Not on file   Number of children: Not on file   Years of education: Not on file   Highest education level: Some college, no degree  Occupational History   Not on file  Tobacco Use   Smoking status: Former    Current packs/day: 0.00    Average packs/day: 0.3 packs/day for 20.0 years (5.0 ttl pk-yrs)    Types: Cigarettes    Start date: 10/31/1992    Quit date: 10/31/2012    Years since quitting: 10.4   Smokeless tobacco: Never  Vaping Use   Vaping status: Never Used  Substance and Sexual Activity   Alcohol use: Not Currently   Drug use: No   Sexual activity: Yes    Birth  control/protection: Surgical    Comment: Hysterectomy  Other Topics Concern   Not on file  Social History Narrative   Caffeine: 2 cups coffee/day   Lives with husband Melinda Hall) and daughter, 3 dogs, 1 ferret   Occupation: Geologist, engineering at daycare   From [place]   Activity: no regular exercise, walks some at work   Diet: good water, fruits/vegetables daily   Social Drivers of Corporate investment banker Strain: Low Risk  (03/05/2023)   Overall Financial Resource Strain (CARDIA)    Difficulty of Paying Living Expenses: Not hard at all  Food Insecurity: No Food Insecurity (03/05/2023)   Hunger Vital Sign    Worried About Running Out of Food in the Last Year: Never true    Ran Out of Food in the Last Year: Never true  Transportation Needs: No Transportation Needs (03/05/2023)   PRAPARE - Administrator, Civil Service (Medical): No    Lack of Transportation (Non-Medical): No  Physical Activity: Sufficiently Active (03/05/2023)   Exercise Vital Sign    Days of Exercise per Week: 5 days    Minutes of Exercise per Session: 40 min  Stress: No Stress Concern Present (03/05/2023)   [employer] of Occupational Health - Occupational Stress Questionnaire    Feeling of Stress : Only a little  Social Connections: Moderately Isolated (03/05/2023)   Social Connection and Isolation Panel [NHANES]    Frequency of Communication with Friends and Family: More than three times a week    Frequency of Social Gatherings with Friends and Family: Once a week    Attends Religious Services: Never    Database administrator or Organizations: No    Attends Engineer, structural: Not on file    Marital Status: Married  Intimate Partner Violence: Unknown (04/05/2021)   Humiliation, Afraid, Rape, and Kick questionnaire    Fear of Current or Ex-Partner: No    Emotionally Abused: No    Physically Abused: No    Sexually Abused: Not on file    Outpatient Medications Prior to Visit   Medication Sig Dispense Refill   buPROPion ER (WELLBUTRIN SR) 150 MG 12 hr tablet Take 1 tablet (150 mg total) by mouth in the morning. 90 tablet 0   calcium carbonate (OS-CAL) 600 MG TABS tablet Take 600 mg by mouth daily with breakfast.     Cholecalciferol (VITAMIN D3) 1000 units CAPS Take 3 capsules (3,000 Units total) by mouth daily.     ferrous sulfate 325 (65 FE) MG tablet Take 1 tablet (325 mg total) by mouth every Monday, Wednesday, and Friday.     hydrOXYzine (ATARAX) 25 MG tablet Take 0.5-1 tablets (12.5-25 mg total) by mouth 3 (three) times  daily as needed. 30 tablet 0   Magnesium 250 MG TABS Take 1 tablet (250 mg total) by mouth daily.  0   naproxen (NAPROSYN) 500 MG tablet TAKE 1 TABLET (500 MG TOTAL) BY MOUTH 2 (TWO) TIMES DAILY WITH A MEAL. FOR 1 WEEK THEN AS NEEDED 40 tablet 0   nitroGLYCERIN (NITRODUR - DOSED IN MG/24 HR) 0.2 mg/hr patch Apply 1 patch to affected area as directed by MD and change every 24 hours. 30 patch 5   vitamin B-12 (CYANOCOBALAMIN) 1000 MCG tablet Take 1 tablet (1,000 mcg total) by mouth daily.     zinc gluconate 50 MG tablet Take 1 tablet (50 mg total) by mouth daily.     Facility-Administered Medications Prior to Visit  Medication Dose Route Frequency Provider Last Rate Last Admin   [START ON 06/27/2023] denosumab (PROLIA) injection 60 mg  60 mg Subcutaneous Once Eustaquio Boyden, MD          ROS:  Review of Systems  Constitutional:  Negative for fever.  Gastrointestinal:  Negative for blood in stool, constipation, diarrhea, nausea and vomiting.  Genitourinary:  Positive for dyspareunia. Negative for dysuria, flank pain, frequency, hematuria, urgency, vaginal bleeding, vaginal discharge and vaginal pain.  Musculoskeletal:  Negative for back pain.  Skin:  Negative for rash.   BREAST: No symptoms   OBJECTIVE:   Vitals:  BP 124/82   Pulse 72   Ht 5\' 4"  (1.626 m)   Wt 170 lb (77.1 kg)   BMI 29.18 kg/m   Physical Exam Vitals reviewed.   Constitutional:      Appearance: She is well-developed.  Pulmonary:     Effort: Pulmonary effort is normal.  Genitourinary:    General: Normal vulva.     Pubic Area: No rash.      Labia:        Right: Lesion present. No rash or tenderness.        Left: Lesion present. No rash or tenderness.      Vagina: Normal. No vaginal discharge, erythema or tenderness.     Cervix: Normal.     Uterus: Absent. Not tender.      Adnexa:        Right: No mass or tenderness.         Left: No mass or tenderness.         Comments: MOD VAG ATROPHY Musculoskeletal:        General: Normal range of motion.     Cervical back: Normal range of motion.  Skin:    General: Skin is warm and dry.  Neurological:     General: No focal deficit present.     Mental Status: She is alert and oriented to person, place, and time.  Psychiatric:        Mood and Affect: Mood normal.        Behavior: Behavior normal.        Thought Content: Thought content normal.        Judgment: Judgment normal.     Assessment/Plan: Dyspareunia in female - Plan: estradiol (ESTRACE VAGINAL) 0.1 MG/GM vaginal cream; add vag ERT. Rx eRxd. Try coconut oil as lubricant. F/u prn.   Vaginal atrophy - Plan: estradiol (ESTRACE VAGINAL) 0.1 MG/GM vaginal cream  Cervical cancer screening - Plan: Cytology - PAP  Screening for HPV (human papillomavirus) - Plan: Cytology - PAP  Sebaceous gland hyperplasia of vulva--reassurance. F/u prn.   NEEDS INTERPRETER   Meds ordered this encounter  Medications  estradiol (ESTRACE VAGINAL) 0.1 MG/GM vaginal cream    Sig: Insert 1 g vaginally nightly for 1 week, then 1-2 times wkly as maintenance    Dispense:  42.5 g    Refill:  1    Supervising Provider:   Hildred Laser [[id number]]      Return in about 1 year (around 04/05/2024).  Mckaila Duffus B. Caeleigh Prohaska, PA-C 04/06/2023 5:09 PM

## 2023-04-06 NOTE — Patient Instructions (Signed)
 I value your feedback and you entrusting [place] with your care. If you get a [place] patient survey, I would appreciate you taking the time to let us know about your experience today. Thank you! ? ? ?

## 2023-04-12 LAB — CYTOLOGY - PAP
Comment: NEGATIVE
Diagnosis: NEGATIVE
High risk HPV: NEGATIVE

## 2023-04-17 ENCOUNTER — Other Ambulatory Visit: Payer: Self-pay | Admitting: Family Medicine

## 2023-04-18 NOTE — Telephone Encounter (Signed)
 Pt has OV with Dr Reece Agar tomorrow. Will send refill at that visit.

## 2023-04-19 ENCOUNTER — Encounter: Payer: Self-pay | Admitting: Family Medicine

## 2023-04-19 ENCOUNTER — Ambulatory Visit: Payer: Medicare HMO | Admitting: Family Medicine

## 2023-04-19 VITALS — BP 118/84 | HR 74 | Temp 97.9°F | Ht 62.5 in | Wt 168.4 lb

## 2023-04-19 DIAGNOSIS — E611 Iron deficiency: Secondary | ICD-10-CM

## 2023-04-19 DIAGNOSIS — M25562 Pain in left knee: Secondary | ICD-10-CM | POA: Diagnosis not present

## 2023-04-19 DIAGNOSIS — G8929 Other chronic pain: Secondary | ICD-10-CM

## 2023-04-19 DIAGNOSIS — Z7189 Other specified counseling: Secondary | ICD-10-CM | POA: Diagnosis not present

## 2023-04-19 DIAGNOSIS — Z9884 Bariatric surgery status: Secondary | ICD-10-CM | POA: Diagnosis not present

## 2023-04-19 DIAGNOSIS — H5702 Anisocoria: Secondary | ICD-10-CM

## 2023-04-19 DIAGNOSIS — E66811 Obesity, class 1: Secondary | ICD-10-CM | POA: Diagnosis not present

## 2023-04-19 DIAGNOSIS — F4323 Adjustment disorder with mixed anxiety and depressed mood: Secondary | ICD-10-CM

## 2023-04-19 DIAGNOSIS — C8210 Follicular lymphoma grade II, unspecified site: Secondary | ICD-10-CM

## 2023-04-19 DIAGNOSIS — M7661 Achilles tendinitis, right leg: Secondary | ICD-10-CM

## 2023-04-19 DIAGNOSIS — Z0000 Encounter for general adult medical examination without abnormal findings: Secondary | ICD-10-CM | POA: Diagnosis not present

## 2023-04-19 DIAGNOSIS — E042 Nontoxic multinodular goiter: Secondary | ICD-10-CM | POA: Diagnosis not present

## 2023-04-19 DIAGNOSIS — M810 Age-related osteoporosis without current pathological fracture: Secondary | ICD-10-CM | POA: Diagnosis not present

## 2023-04-19 MED ORDER — NAPROXEN 500 MG PO TABS: 500.0000 mg | ORAL_TABLET | Freq: Two times a day (BID) | ORAL | 0 refills | Status: DC

## 2023-04-19 MED ORDER — BUPROPION HCL ER (SR) 150 MG PO TB12: 150.0000 mg | ORAL_TABLET | Freq: Every morning | ORAL | 4 refills | Status: DC

## 2023-04-19 MED ORDER — HYDROXYZINE HCL 25 MG PO TABS: 12.5000 mg | ORAL_TABLET | Freq: Three times a day (TID) | ORAL | 0 refills | Status: DC | PRN

## 2023-04-19 NOTE — Assessment & Plan Note (Deleted)
 Preventative protocols reviewed and updated unless pt declined. Discussed healthy diet and lifestyle.

## 2023-04-19 NOTE — Assessment & Plan Note (Signed)
Overall stable period.  

## 2023-04-19 NOTE — Patient Instructions (Addendum)
 Welcome to medicare EKG today  Gusto verla hoy.  Regresar en 6 meses para proxima visita.  Packet de living will provided today

## 2023-04-19 NOTE — Assessment & Plan Note (Signed)
 Thyroid biopsy x2 returned benign (follicular thyroid nodules).

## 2023-04-19 NOTE — Assessment & Plan Note (Signed)
 Advanced directive - does not have this set up. Would want husband and daughter Melinda Hall to be HCPOA. Full code but would not want prolonged life support if terminal condition. Thinks would be ok with feeding tube only if conscious, not if unconscious

## 2023-04-19 NOTE — Assessment & Plan Note (Signed)

## 2023-04-19 NOTE — Assessment & Plan Note (Addendum)
 Continues nitroglycerin patches with sparing naprosyn.  Caution in gastric bypass hx.

## 2023-04-19 NOTE — Assessment & Plan Note (Signed)
 Continue oral iron MWF ?

## 2023-04-19 NOTE — Assessment & Plan Note (Deleted)
 Chronic, stable on wellbutrin SR 150mg  daily - continue.

## 2023-04-19 NOTE — Assessment & Plan Note (Signed)
 Appreciate oncology care. Completing final Rituxan maintenance chemo cycle.

## 2023-04-19 NOTE — Assessment & Plan Note (Addendum)
 Chronic, stable on Prolia q29mo, with significant improvement on last DEXA T score from -2.7 to -1.9.  Latest injection 12/28/2023.

## 2023-04-19 NOTE — Assessment & Plan Note (Signed)
 Chronic, stable L>R

## 2023-04-19 NOTE — Assessment & Plan Note (Signed)
 Chronic, stable on wellbutrin SR 150mg  daily - continue.

## 2023-04-19 NOTE — Progress Notes (Signed)
 Ph: [phone] Fax: [phone]   Patient ID: Melinda Hall, female    DOB: [birth year], 63 y.o.   MRN: [id number]  This visit was conducted in person.  BP 118/84   Pulse 74   Temp 97.9 F (36.6 C) (Oral)   Ht 5' 2.5" (1.588 m)   Wt 168 lb 6 oz (76.4 kg)   SpO2 96%   BMI 30.31 kg/m    CC: welcome to medicare visit Subjective:   HPI: Melinda Hall is a 63 y.o. female presenting on 04/19/2023 for Medicare Wellness   She was approved for disability last year.  Received medicare 10/2022.   Hearing Screening   500Hz  1000Hz  2000Hz  4000Hz   Right ear 25 40 20 0  Left ear 25 20 20  40  Vision Screening - Comments:: Last eye exam, 02/2023.  Flowsheet Row Office Visit from 04/19/2023 in [facility] [place]  PHQ-2 Total Score 0          04/19/2023    2:58 PM 11/09/2022    9:27 AM 10/19/2022   12:13 PM 10/04/2022    8:33 AM 07/22/2022    3:22 PM  Fall Risk   Falls in the past year? 0 0 0 0 0  Number falls in past yr:  0 0    Injury with Fall?  0 0    Risk for fall due to :  No Fall Risks No Fall Risks    Follow up  Falls evaluation completed Falls evaluation completed     S/p roux en y gastric bypass 2004.  S/p cervical neck surgery (Pool) 2018.  Thyroid biopsy x2 returned benign (follicular thyroid nodules).   Diagnosed 2022 with stage 3 follicular lymphoma s/p chemo course (IV Rituxan x4 wks 05/2020) - sees Dr Orlie Dakin. Recurrence 03/2021, restarted maintenance Rituxan - 1 more session.    Wellbutrin SR 150mg  daily for adjustment disorder.     Request NSAID refilled for chronic achilles tendinopathy. She also continues nitroglycerin patches prn.   Preventative: Colonoscopy 11/2015 - TA rpt 5 yrs ([facility]).  COLONOSCOPY WITH PROPOFOL 05/17/2021 - TAs, SSPs, rpt 3 yrs Servando Snare, Darren, MD) Well woman with OBGYN Helmut Muster Copland) 04/2023, s/p bilateral oophorectomy for dermoid cysts age 89 yo. Never used HRT.  Mammo 05/2022 - Birads2 @ [place]  DEXA  11/2015: T -2.6 spine osteoporosis, saw endo Dr Aliene Altes, rec against treatment at this time.  DEXA 02/2019 T -2.7 at spine - started on prolia, last injection 10/19/2021.  DEXA 05/2022 - T -1.9 spine, -1.4 TRF - improved  Continues Prolia injections - latest injection 12/28/2022.  Lung cancer screening - not eligible  Flu shot yearly COVID vaccine Pfizer 04/2019, 09/2019, no booster Tdap 2018  Prevnar-20 - will provide next visit.  RSV - to discuss with onc Shingrix - discussed. Declines as she has never had chicken pox.  Advanced directive - does not have this set up. Would want husband and daughter Elonda Husky to be HCPOA. Full code but would not want prolonged life support if terminal condition. Thinks would be ok with feeding tube only if conscious, not if unconscious.  Seat belt use discussed  Sunscreen use discussed. No changing moles on skin  Ex smoker Alcohol - rare  Dentist Q6 mo Eye exam yearly    Caffeine: 2 cups coffee/day  [place]  Lives with husband Jeffie Pollock) and daughter, 3 dogs, 1 ferret  Occupation: Geologist, engineering at daycare  Activity: staying active, enjoys zumba  Diet: good water, fruits/vegetables  daily      Relevant past medical, surgical, family and social history reviewed and updated as indicated. Interim medical history since our last visit reviewed. Allergies and medications reviewed and updated. Outpatient Medications Prior to Visit  Medication Sig Dispense Refill   calcium carbonate (OS-CAL) 600 MG TABS tablet Take 600 mg by mouth daily with breakfast.     Cholecalciferol (VITAMIN D3) 1000 units CAPS Take 3 capsules (3,000 Units total) by mouth daily.     estradiol (ESTRACE VAGINAL) 0.1 MG/GM vaginal cream Insert 1 g vaginally nightly for 1 week, then 1-2 times wkly as maintenance 42.5 g 1   ferrous sulfate 325 (65 FE) MG tablet Take 1 tablet (325 mg total) by mouth every Monday, Wednesday, and Friday.     Magnesium 250 MG TABS Take 1 tablet (250 mg  total) by mouth daily.  0   nitroGLYCERIN (NITRODUR - DOSED IN MG/24 HR) 0.2 mg/hr patch Apply 1 patch to affected area as directed by MD and change every 24 hours. 30 patch 5   vitamin B-12 (CYANOCOBALAMIN) 1000 MCG tablet Take 1 tablet (1,000 mcg total) by mouth daily.     zinc gluconate 50 MG tablet Take 1 tablet (50 mg total) by mouth daily.     buPROPion ER (WELLBUTRIN SR) 150 MG 12 hr tablet Take 1 tablet (150 mg total) by mouth in the morning. 90 tablet 0   hydrOXYzine (ATARAX) 25 MG tablet Take 0.5-1 tablets (12.5-25 mg total) by mouth 3 (three) times daily as needed. 30 tablet 0   naproxen (NAPROSYN) 500 MG tablet TAKE 1 TABLET (500 MG TOTAL) BY MOUTH 2 (TWO) TIMES DAILY WITH A MEAL. FOR 1 WEEK THEN AS NEEDED 40 tablet 0   Facility-Administered Medications Prior to Visit  Medication Dose Route Frequency Provider Last Rate Last Admin   [START ON 06/27/2023] denosumab (PROLIA) injection 60 mg  60 mg Subcutaneous Once Eustaquio Boyden, MD         Per HPI unless specifically indicated in ROS section below Review of Systems  Objective:  BP 118/84   Pulse 74   Temp 97.9 F (36.6 C) (Oral)   Ht 5' 2.5" (1.588 m)   Wt 168 lb 6 oz (76.4 kg)   SpO2 96%   BMI 30.31 kg/m   Wt Readings from Last 3 Encounters:  04/19/23 168 lb 6 oz (76.4 kg)  04/06/23 170 lb (77.1 kg)  03/09/23 167 lb 8 oz (76 kg)      Physical Exam Vitals and nursing note reviewed.  Constitutional:      Appearance: Normal appearance. She is not ill-appearing.  HENT:     Head: Normocephalic and atraumatic.     Right Ear: Tympanic membrane, ear canal and external ear normal. There is no impacted cerumen.     Left Ear: Tympanic membrane, ear canal and external ear normal. There is no impacted cerumen.     Mouth/Throat:     Mouth: Mucous membranes are moist.     Pharynx: Oropharynx is clear. No oropharyngeal exudate or posterior oropharyngeal erythema.  Eyes:     General:        Right eye: No discharge.         Left eye: No discharge.     Extraocular Movements: Extraocular movements intact.     Conjunctiva/sclera: Conjunctivae normal.     Pupils: Pupils are equal, round, and reactive to light.  Neck:     Thyroid: No thyroid mass or thyromegaly.  Vascular: No carotid bruit.  Cardiovascular:     Rate and Rhythm: Normal rate and regular rhythm.     Pulses: Normal pulses.     Heart sounds: Normal heart sounds. No murmur heard. Pulmonary:     Effort: Pulmonary effort is normal. No respiratory distress.     Breath sounds: Normal breath sounds. No wheezing, rhonchi or rales.  Abdominal:     General: Bowel sounds are normal. There is no distension.     Palpations: Abdomen is soft. There is no mass.     Tenderness: There is no abdominal tenderness. There is no guarding or rebound.     Hernia: No hernia is present.  Musculoskeletal:     Cervical back: Normal range of motion and neck supple. No rigidity.     Right lower leg: No edema.     Left lower leg: No edema.  Lymphadenopathy:     Cervical: No cervical adenopathy.  Skin:    General: Skin is warm and dry.     Findings: No rash.  Neurological:     General: No focal deficit present.     Mental Status: She is alert. Mental status is at baseline.     Comments:  Recall 3/3 Calculation 5/5 ODNUM  Psychiatric:        Mood and Affect: Mood normal.        Behavior: Behavior normal.       Results for orders placed or performed in visit on 04/06/23  Cytology - PAP   Collection Time: 04/06/23  4:13 PM  Result Value Ref Range   High risk HPV Negative    Adequacy Satisfactory for evaluation.    Diagnosis      - Negative for intraepithelial lesion or malignancy (NILM)   Comment Normal Reference Range HPV - Negative       04/19/2023    2:59 PM 02/27/2023    9:24 AM 11/09/2022    9:27 AM 10/19/2022   12:13 PM 10/04/2022    8:33 AM  Depression screen PHQ 2/9  Decreased Interest 0 0 0 0 0  Down, Depressed, Hopeless 0 1 1 0 0  PHQ - 2 Score 0  1 1 0 0  Altered sleeping 1 1 1  0 3  Tired, decreased energy 0 0 0 0 0  Change in appetite 0 1 1 0 2  Feeling bad or failure about yourself  0 0 0 0 0  Trouble concentrating 0 0 0 0 0  Moving slowly or fidgety/restless 0 0 0 0 0  Suicidal thoughts 0 0 0 0 0  PHQ-9 Score 1 3 3  0 5  Difficult doing work/chores Not difficult at all  Not difficult at all Not difficult at all Not difficult at all       04/19/2023    2:59 PM 02/27/2023    9:25 AM 11/09/2022    9:27 AM 10/04/2022    8:34 AM  GAD 7 : Generalized Anxiety Score  Nervous, Anxious, on Edge 0 1 0 1  Control/stop worrying 1 0 0 0  Worry too much - different things 1 0 1 0  Trouble relaxing 0 0 1 1  Restless 0 0 0 0  Easily annoyed or irritable 0 0 0 0  Afraid - awful might happen 0 0 1 0  Total GAD 7 Score 2 1 3 2   Anxiety Difficulty Not difficult at all  Not difficult at all    EKG today - NSR rate 70, normal  axis, intervals, no hypertrophy or acute ST/T changes  Assessment & Plan:   Problem List Items Addressed This Visit     Status post gastric bypass for obesity (Chronic)   Update labs.       Relevant Orders   Lipid panel   VITAMIN D 25 Hydroxy (Vit-D Deficiency, Fractures)   Vitamin B1   Comprehensive metabolic panel   TSH   Hemoglobin A1c   CBC with Differential/Platelet   Vitamin B12   Ferritin   IBC panel   Folate   Follicular lymphoma (HCC) (Chronic)   Appreciate oncology care. Completing final Rituxan maintenance chemo cycle.       Relevant Medications   naproxen (NAPROSYN) 500 MG tablet   Welcome to Medicare preventive visit - Primary (Chronic)   I have personally reviewed the Medicare Annual Wellness questionnaire and have noted 1. The patient's medical and social history 2. Their use of alcohol, tobacco or illicit drugs 3. Their current medications and supplements 4. The patient's functional ability including ADL's, fall risks, home safety risks and hearing or visual impairment. Cognitive  function has been assessed and addressed as indicated.  5. Diet and physical activity 6. Evidence for depression or mood disorders The patients weight, height, BMI have been recorded in the chart. I have made referrals, counseling and provided education to the patient based on review of the above and I have provided the pt with a written personalized care plan for preventive services. Provider list updated.. See scanned questionairre as needed for further documentation. Reviewed preventative protocols and updated unless pt declined.       Relevant Orders   EKG 12-Lead (Completed)   Advanced directives, counseling/discussion (Chronic)   Advanced directive - does not have this set up. Would want husband and daughter Elonda Husky to be HCPOA. Full code but would not want prolonged life support if terminal condition. Thinks would be ok with feeding tube only if conscious, not if unconscious      Iron deficiency   Continue oral iron MWF.       Relevant Orders   CBC with Differential/Platelet   Ferritin   IBC panel   Obesity, Class I, BMI 30-34.9   Overall stable period.       Adjustment disorder with mixed anxiety and depressed mood   Chronic, stable on wellbutrin SR 150mg  daily - continue.       Relevant Medications   buPROPion (WELLBUTRIN SR) 150 MG 12 hr tablet   hydrOXYzine (ATARAX) 25 MG tablet   Osteoporosis   Chronic, stable on Prolia q47mo, with significant improvement on last DEXA T score from -2.7 to -1.9.  Latest injection 12/28/2023.       Relevant Orders   VITAMIN D 25 Hydroxy (Vit-D Deficiency, Fractures)   Anisocoria   Chronic, stable L>R      Chronic pain of left knee   Relevant Medications   naproxen (NAPROSYN) 500 MG tablet   buPROPion (WELLBUTRIN SR) 150 MG 12 hr tablet   Multiple thyroid nodules   Thyroid biopsy x2 returned benign (follicular thyroid nodules).       Tendonitis, Achilles, right   Continues nitroglycerin patches with sparing naprosyn.   Caution in gastric bypass hx.         Meds ordered this encounter  Medications   naproxen (NAPROSYN) 500 MG tablet    Sig: Take 1 tablet (500 mg total) by mouth 2 (two) times daily with a meal. For 1 week then as needed  Dispense:  40 tablet    Refill:  0   buPROPion (WELLBUTRIN SR) 150 MG 12 hr tablet    Sig: Take 1 tablet (150 mg total) by mouth in the morning.    Dispense:  90 tablet    Refill:  4   hydrOXYzine (ATARAX) 25 MG tablet    Sig: Take 0.5-1 tablets (12.5-25 mg total) by mouth 3 (three) times daily as needed.    Dispense:  30 tablet    Refill:  0    Orders Placed This Encounter  Procedures   Lipid panel   VITAMIN D 25 Hydroxy (Vit-D Deficiency, Fractures)   Vitamin B1   Comprehensive metabolic panel   TSH   Hemoglobin A1c   CBC with Differential/Platelet   Vitamin B12   Ferritin   IBC panel   Folate   EKG 12-Lead    Patient Instructions  Welcome to medicare EKG today  Gusto verla hoy.  Regresar en 6 meses para proxima visita.  Packet de living will provided today   Follow up plan: Return in about 6 months (around 10/20/2023) for follow up visit.  Eustaquio Boyden, MD

## 2023-04-19 NOTE — Assessment & Plan Note (Addendum)
 Update labs.

## 2023-04-20 ENCOUNTER — Encounter: Payer: Self-pay | Admitting: Oncology

## 2023-04-20 LAB — LIPID PANEL
Cholesterol: 163 mg/dL (ref 0–200)
HDL: 64.2 mg/dL (ref >39.00)
LDL Cholesterol: 83 mg/dL (ref 0–99)
NonHDL: 99.06
Total CHOL/HDL Ratio: 3
Triglycerides: 79 mg/dL (ref 0.0–149.0)
VLDL: 15.8 mg/dL (ref 0.0–40.0)

## 2023-04-20 LAB — CBC WITH DIFFERENTIAL/PLATELET
Basophils Absolute: 0 10*3/uL (ref 0.0–0.1)
Basophils Relative: 0.6 % (ref 0.0–3.0)
Eosinophils Absolute: 0.2 10*3/uL (ref 0.0–0.7)
Eosinophils Relative: 2.7 % (ref 0.0–5.0)
HCT: 43.6 % (ref 36.0–46.0)
Hemoglobin: 14.4 g/dL (ref 12.0–15.0)
Lymphocytes Relative: 27.9 % (ref 12.0–46.0)
Lymphs Abs: 1.8 10*3/uL (ref 0.7–4.0)
MCHC: 33.1 g/dL (ref 30.0–36.0)
MCV: 91.8 fl (ref 78.0–100.0)
Monocytes Absolute: 0.7 10*3/uL (ref 0.1–1.0)
Monocytes Relative: 10.5 % (ref 3.0–12.0)
Neutro Abs: 3.8 10*3/uL (ref 1.4–7.7)
Neutrophils Relative %: 58.3 % (ref 43.0–77.0)
Platelets: 260 10*3/uL (ref 150.0–400.0)
RBC: 4.75 Mil/uL (ref 3.87–5.11)
RDW: 15.2 % (ref 11.5–15.5)
WBC: 6.6 10*3/uL (ref 4.0–10.5)

## 2023-04-20 LAB — COMPREHENSIVE METABOLIC PANEL
ALT: 24 U/L (ref 0–35)
AST: 20 U/L (ref 0–37)
Albumin: 4.3 g/dL (ref 3.5–5.2)
Alkaline Phosphatase: 46 U/L (ref 39–117)
BUN: 14 mg/dL (ref 6–23)
CO2: 27 meq/L (ref 19–32)
Calcium: 9.1 mg/dL (ref 8.4–10.5)
Chloride: 105 meq/L (ref 96–112)
Creatinine, Ser: 0.61 mg/dL (ref 0.40–1.20)
GFR: 95.15 mL/min (ref >60.00)
Glucose, Bld: 77 mg/dL (ref 70–99)
Potassium: 4.2 meq/L (ref 3.5–5.1)
Sodium: 139 meq/L (ref 135–145)
Total Bilirubin: 0.9 mg/dL (ref 0.2–1.2)
Total Protein: 6.1 g/dL (ref 6.0–8.3)

## 2023-04-20 LAB — IBC PANEL
Iron: 149 ug/dL — ABNORMAL HIGH (ref 42–145)
Saturation Ratios: 42.7 % (ref 20.0–50.0)
TIBC: 348.6 ug/dL (ref 250.0–450.0)
Transferrin: 249 mg/dL (ref 212.0–360.0)

## 2023-04-20 LAB — HEMOGLOBIN A1C: Hgb A1c MFr Bld: 5.1 % (ref 4.6–6.5)

## 2023-04-20 LAB — FERRITIN: Ferritin: 16 ng/mL (ref 10.0–291.0)

## 2023-04-20 LAB — VITAMIN B12: Vitamin B-12: 381 pg/mL (ref 211–911)

## 2023-04-20 LAB — FOLATE: Folate: 11.3 ng/mL (ref >5.9)

## 2023-04-20 LAB — VITAMIN D 25 HYDROXY (VIT D DEFICIENCY, FRACTURES): VITD: 39.56 ng/mL (ref 30.00–100.00)

## 2023-04-20 LAB — TSH: TSH: 3 u[IU]/mL (ref 0.35–5.50)

## 2023-04-24 LAB — VITAMIN B1: Vitamin B1 (Thiamine): 8 nmol/L (ref 8–30)

## 2023-04-25 ENCOUNTER — Other Ambulatory Visit: Payer: Self-pay | Admitting: Family Medicine

## 2023-04-25 NOTE — Telephone Encounter (Signed)
 Last office visit 04/19/23 with Dr. Reece Agar for CPE.  Last refilled 12/26/2022 for #30 with 5 refills by Dr. Patsy Lager.  No future appointments with Dr. Reece Agar or Dr. Patsy Lager  Refill?

## 2023-05-03 ENCOUNTER — Inpatient Hospital Stay: Payer: Medicare HMO | Attending: Oncology

## 2023-05-03 ENCOUNTER — Inpatient Hospital Stay: Payer: Medicare HMO | Admitting: Oncology

## 2023-05-03 ENCOUNTER — Encounter: Payer: Self-pay | Admitting: Oncology

## 2023-05-03 ENCOUNTER — Inpatient Hospital Stay: Payer: Medicare HMO

## 2023-05-03 VITALS — BP 121/85 | HR 73 | Temp 97.2°F | Resp 20 | Wt 165.0 lb

## 2023-05-03 VITALS — BP 134/81 | HR 62 | Temp 97.0°F | Resp 18

## 2023-05-03 DIAGNOSIS — C8218 Follicular lymphoma grade II, lymph nodes of multiple sites: Secondary | ICD-10-CM

## 2023-05-03 DIAGNOSIS — Z5112 Encounter for antineoplastic immunotherapy: Secondary | ICD-10-CM | POA: Diagnosis present

## 2023-05-03 DIAGNOSIS — Z79620 Long term (current) use of immunosuppressive biologic: Secondary | ICD-10-CM | POA: Insufficient documentation

## 2023-05-03 DIAGNOSIS — C8213 Follicular lymphoma grade II, intra-abdominal lymph nodes: Secondary | ICD-10-CM | POA: Diagnosis present

## 2023-05-03 LAB — CBC WITH DIFFERENTIAL/PLATELET
Abs Immature Granulocytes: 0.03 10*3/uL (ref 0.00–0.07)
Basophils Absolute: 0 10*3/uL (ref 0.0–0.1)
Basophils Relative: 1 %
Eosinophils Absolute: 0.2 10*3/uL (ref 0.0–0.5)
Eosinophils Relative: 3 %
HCT: 44.1 % (ref 36.0–46.0)
Hemoglobin: 14.6 g/dL (ref 12.0–15.0)
Immature Granulocytes: 1 %
Lymphocytes Relative: 26 %
Lymphs Abs: 1.6 10*3/uL (ref 0.7–4.0)
MCH: 30.2 pg (ref 26.0–34.0)
MCHC: 33.1 g/dL (ref 30.0–36.0)
MCV: 91.1 fL (ref 80.0–100.0)
Monocytes Absolute: 0.6 10*3/uL (ref 0.1–1.0)
Monocytes Relative: 9 %
Neutro Abs: 3.8 10*3/uL (ref 1.7–7.7)
Neutrophils Relative %: 60 %
Platelets: 242 10*3/uL (ref 150–400)
RBC: 4.84 MIL/uL (ref 3.87–5.11)
RDW: 13.9 % (ref 11.5–15.5)
WBC: 6.2 10*3/uL (ref 4.0–10.5)
nRBC: 0 % (ref 0.0–0.2)

## 2023-05-03 MED ORDER — DIPHENHYDRAMINE HCL 25 MG PO CAPS
50.0000 mg | ORAL_CAPSULE | Freq: Once | ORAL | Status: AC
  Administered 2023-05-03: 50 mg via ORAL
  Filled 2023-05-03: qty 2

## 2023-05-03 MED ORDER — SODIUM CHLORIDE 0.9 % IV SOLN
Freq: Once | INTRAVENOUS | Status: AC
  Filled 2023-05-03: qty 250

## 2023-05-03 MED ORDER — ACETAMINOPHEN 325 MG PO TABS
650.0000 mg | ORAL_TABLET | Freq: Once | ORAL | Status: AC
  Administered 2023-05-03: 650 mg via ORAL
  Filled 2023-05-03: qty 2

## 2023-05-03 MED ORDER — RITUXIMAB-PVVR CHEMO 500 MG/50ML IV SOLN
375.0000 mg/m2 | Freq: Once | INTRAVENOUS | Status: AC
  Administered 2023-05-03: 700 mg via INTRAVENOUS
  Filled 2023-05-03: qty 20

## 2023-05-03 NOTE — Progress Notes (Unsigned)
 She would like to discuss what the next steps are since she is having her last treatment today, and she is so excited, and I told her to make sure and ring that bell downstairs when she is finished.

## 2023-05-03 NOTE — Patient Instructions (Signed)
 Instrucciones al darle de alta: Discharge Instructions Gracias por elegir al [facility] de Cncer de [place] para brindarle atencin mdica de oncologa y Teacher, English as a foreign language.   Si usted tiene una cita de laboratorio con [employer] de [place], por favor vaya directamente al [employer] de Cncer y regstrese en el rea de Engineer, maintenance (IT).   Use ropa cmoda y [place] para tener fcil acceso a las vas del Portacath (acceso venoso de Set designer duracin) o la lnea PICC (catter central colocado por va perifrica).   Nos esforzamos por ofrecerle tiempo de calidad con su proveedor. Es posible que tenga que volver a programar su cita si llega tarde (15 minutos o ms).  El llegar tarde le afecta a usted y a otros pacientes cuyas citas son posteriores a Armed forces operational officer.  Adems, si usted falta a tres o ms citas sin avisar a la oficina, puede ser retirado(a) de la clnica a discrecin del proveedor.      Para las solicitudes de renovacin de recetas, pida a su farmacia que se ponga en contacto con nuestra oficina y deje que transcurran 72 horas para que se complete el proceso de las renovaciones.    Hoy usted recibi los siguientes agentes de quimioterapia e/o inmunoterapia Ruxience      Para ayudar a prevenir las nuseas y los vmitos despus de su tratamiento, le recomendamos que tome su medicamento para las nuseas segn las indicaciones.  LOS SNTOMAS QUE DEBEN COMUNICARSE INMEDIATAMENTE SE INDICAN A CONTINUACIN: *FIEBRE SUPERIOR A 100.4 F (38 C) O MS *ESCALOFROS O SUDORACIN *NUSEAS Y VMITOS QUE NO SE CONTROLAN CON EL MEDICAMENTO PARA LAS NUSEAS *DIFICULTAD INUSUAL PARA RESPIRAR  *MORETONES O HEMORRAGIAS NO HABITUALES *PROBLEMAS URINARIOS (dolor o ardor al Geographical information systems officer o frecuencia para Geographical information systems officer) *PROBLEMAS INTESTINALES (diarrea inusual, estreimiento, dolor cerca del ano) SENSIBILIDAD EN LA BOCA Y EN LA GARGANTA CON O SIN LA PRESENCIA DE LCERAS (dolor de garganta, llagas en la boca o dolor de muelas/dientes) ERUPCIN,  HINCHAZN O DOLORES INUSUALES FLUJO VAGINAL INUSUAL O PICAZN/RASQUIA    Los puntos marcados con un asterisco ( *) indican una posible emergencia y debe hacer un seguimiento tan pronto como le sea posible o vaya al Departamento de Emergencias si se le presenta algn problema.  Por favor, muestre la [place] DE ADVERTENCIA DE Marc Morgans DE ADVERTENCIA DE Gardiner Fanti al registrarse en [address] de Emergencias y a la enfermera de triaje.  Si tiene preguntas despus de su visita o necesita cancelar o volver a programar su cita, por favor pngase en contacto con [facility] - A DEPT OF Eligha Bridegroom [facility]  [phone]  y siga las instrucciones. Las horas de oficina son de 8:00 a.m. a 4:30 p.m. de lunes a viernes. Por favor, tenga en cuenta que los mensajes de voz que se dejan despus de las 4:00 p.m. posiblemente no se devolvern hasta el siguiente da de [place].  Cerramos los fines de semana y [employer]. En todo momento tiene acceso a una enfermera para preguntas urgentes. Por favor, llame al nmero principal de la clnica  [phone] y siga las instrucciones.   Para cualquier pregunta que no sea de carcter urgente, tambin puede ponerse en contacto con su proveedor [employer]. Ahora ofrecemos visitas electrnicas para cualquier persona mayor de 18 aos que solicite atencin mdica en lnea para los sntomas que no sean urgentes. Para ms detalles vaya a [email or web address].   Tambin puede bajar la aplicacin de MyChart! Vaya a la  tienda de aplicaciones, busque "MyChart", abra la aplicacin, seleccione [place], e ingrese con su nombre de usuario y la contrasea de Clinical cytogeneticist.

## 2023-05-03 NOTE — Progress Notes (Unsigned)
 [facility]  [phone] [phone]  ID: Gloris Manchester OB: 1960-08-18  MR#: [id number]  MVH#:[id number]  Patient Care Team: Eustaquio Boyden, MD as PCP - General (Family Medicine) Jeralyn Ruths, MD as Consulting Physician (Oncology) Lucy Chris, MD as Consulting Physician (Neurosurgery)  CHIEF COMPLAINT: Recurrent stage III follicular lymphoma, grade 1/2.    INTERVAL HISTORY: Patient returns to clinic today for further evaluation and her final infusion of maintenance Rituxan.  She continues to have occasional hot flashes, but otherwise feels well.  She is tolerating treatment without significant side effects.  She denies any recent fevers or illnesses.  She has a good appetite and denies weight loss.  She has no neurologic complaints.  She does not complain of any weakness or fatigue today. She has no chest pain, shortness of breath, cough, or hemoptysis.  She denies any nausea, vomiting, constipation, or diarrhea.  She has no urinary complaints.  Patient offers no further specific complaints today.  REVIEW OF SYSTEMS:   Review of Systems  Constitutional:  Positive for diaphoresis. Negative for fever, malaise/fatigue and weight loss.  Respiratory: Negative.  Negative for cough, hemoptysis and shortness of breath.   Cardiovascular: Negative.  Negative for chest pain and leg swelling.  Gastrointestinal: Negative.  Negative for abdominal pain.  Genitourinary: Negative.  Negative for dysuria.  Musculoskeletal: Negative.  Negative for back pain and myalgias.  Skin: Negative.  Negative for rash.  Neurological: Negative.  Negative for tingling, sensory change, focal weakness, weakness and headaches.  Psychiatric/Behavioral: Negative.  Negative for depression. The patient is not nervous/anxious.     As per HPI. Otherwise, a complete review of systems is negative.  PAST MEDICAL HISTORY: Past Medical History:  Diagnosis Date   Cancer (HCC)     Dermoid tumor    hx of--left ovary   Follicular lymphoma (HCC)    Gastric bypass status for obesity 2004   s/p roux-en-y   Generalized headaches    frequent   Heart murmur    History of cardiac murmur    Intraductal papilloma of breast, left 06/18/2017   05/2017 - s/p excision 07/2017   Lymphoma (HCC)    Osteoporosis 03/2016   DEXA -2.6 spine   Salivary gland stone    x 2   Smoker     PAST SURGICAL HISTORY: Past Surgical History:  Procedure Laterality Date   ABDOMINAL HYSTERECTOMY     only ovary was removed; supracx   ANTERIOR CERVICAL DECOMP/DISCECTOMY FUSION  12/2016   Pool   APPENDECTOMY  1980   BILATERAL OOPHORECTOMY  ~1992   for dermoid ovarian cysts   BREAST BIOPSY Left 06/13/2017   papilloma with excision 07/2017   BREAST LUMPECTOMY WITH NEEDLE LOCALIZATION Left 08/10/2017   intraductal papilloma - Sterling Big F, MD   CHOLECYSTECTOMY  2000s   COLONOSCOPY WITH PROPOFOL N/A 11/27/2015   TA rpt 5 yrs Midge Minium, MD)   COLONOSCOPY WITH PROPOFOL N/A 05/17/2021   TAs, SSPs, rpt 3 yrs Servando Snare, Darren, MD)   GASTRIC BYPASS  2004   roux en y [facility])   POLYPECTOMY  11/27/2015   Procedure: POLYPECTOMY;  Surgeon: Midge Minium, MD;  Location: [facility];  Service: Endoscopy;;   POLYPECTOMY  05/17/2021   Procedure: POLYPECTOMY;  Surgeon: Midge Minium, MD;  Location: [facility];  Service: Endoscopy;;   REDUCTION MAMMAPLASTY Bilateral 1990's   SALIVARY GLAND SURGERY     SALIVARY STONE REMOVAL      FAMILY HISTORY: Family History  Problem Relation Age of Onset   Hypertension Mother    Cancer Father 72       leukemia   Cancer Brother 8       kidney   Diabetes Neg Hx    Stroke Neg Hx    Coronary artery disease Neg Hx    Breast cancer Neg Hx     ADVANCED DIRECTIVES (Y/N):  N  HEALTH MAINTENANCE: Social History   Tobacco Use   Smoking status: Former    Current packs/day: 0.00    Average packs/day: 0.3 packs/day for 23.3 years (6.0 ttl pk-yrs)     Types: Cigarettes    Start date: 10/31/1992    Quit date: 10/31/2012    Years since quitting: 10.5   Smokeless tobacco: Never  Vaping Use   Vaping status: Never Used  Substance Use Topics   Alcohol use: Not Currently   Drug use: No     Colonoscopy:  PAP:  Bone density:  Lipid panel:  Allergies  Allergen Reactions   Boniva [Ibandronate] Other (See Comments)    Diffuse body aches    Current Outpatient Medications  Medication Sig Dispense Refill   buPROPion (WELLBUTRIN SR) 150 MG 12 hr tablet Take 1 tablet (150 mg total) by mouth in the morning. 90 tablet 4   calcium carbonate (OS-CAL) 600 MG TABS tablet Take 600 mg by mouth daily with breakfast.     Cholecalciferol (VITAMIN D3) 1000 units CAPS Take 3 capsules (3,000 Units total) by mouth daily.     estradiol (ESTRACE VAGINAL) 0.1 MG/GM vaginal cream Insert 1 g vaginally nightly for 1 week, then 1-2 times wkly as maintenance 42.5 g 1   ferrous sulfate 325 (65 FE) MG tablet Take 1 tablet (325 mg total) by mouth every Monday, Wednesday, and Friday.     hydrOXYzine (ATARAX) 25 MG tablet Take 0.5-1 tablets (12.5-25 mg total) by mouth 3 (three) times daily as needed. 30 tablet 0   Magnesium 250 MG TABS Take 1 tablet (250 mg total) by mouth daily.  0   naproxen (NAPROSYN) 500 MG tablet Take 1 tablet (500 mg total) by mouth 2 (two) times daily with a meal. For 1 week then as needed 40 tablet 0   nitroGLYCERIN (NITRODUR - DOSED IN MG/24 HR) 0.2 mg/hr patch APPLY 1 PATCH TO AFFECTED AREA AS DIRECTED BY MD AND CHANGE EVERY 24 HOURS. 90 patch 1   vitamin B-12 (CYANOCOBALAMIN) 1000 MCG tablet Take 1 tablet (1,000 mcg total) by mouth daily.     zinc gluconate 50 MG tablet Take 1 tablet (50 mg total) by mouth daily.     Current Facility-Administered Medications  Medication Dose Route Frequency Provider Last Rate Last Admin   [START ON 06/27/2023] denosumab (PROLIA) injection 60 mg  60 mg Subcutaneous Once Eustaquio Boyden, MD         OBJECTIVE: Vitals:   05/03/23 0901  BP: 121/85  Pulse: 73  Resp: 20  Temp: (!) 97.2 F (36.2 C)  SpO2: 97%     Body mass index is 29.7 kg/m.    ECOG FS:0 - Asymptomatic  General: Well-developed, well-nourished, no acute distress. Eyes: Pink conjunctiva, anicteric sclera. HEENT: Normocephalic, moist mucous membranes. Lungs: No audible wheezing or coughing. Heart: Regular rate and rhythm. Abdomen: Soft, nontender, no obvious distention. Musculoskeletal: No edema, cyanosis, or clubbing. Neuro: Alert, answering all questions appropriately. Cranial nerves grossly intact. Skin: No rashes or petechiae noted. Psych: Normal affect.  LAB RESULTS:  Lab Results  Component Value  Date   NA 139 04/19/2023   K 4.2 04/19/2023   CL 105 04/19/2023   CO2 27 04/19/2023   GLUCOSE 77 04/19/2023   BUN 14 04/19/2023   CREATININE 0.61 04/19/2023   CALCIUM 9.1 04/19/2023   PROT 6.1 04/19/2023   ALBUMIN 4.3 04/19/2023   AST 20 04/19/2023   ALT 24 04/19/2023   ALKPHOS 46 04/19/2023   BILITOT 0.9 04/19/2023   GFRNONAA >60 01/11/2023   GFRAA >60 07/10/2013    Lab Results  Component Value Date   WBC 6.2 05/03/2023   NEUTROABS 3.8 05/03/2023   HGB 14.6 05/03/2023   HCT 44.1 05/03/2023   MCV 91.1 05/03/2023   PLT 242 05/03/2023     STUDIES: No results found.  ONCOLOGY HISTORY: Retroperitoneal lymph node biopsy on April 21, 2020 confirmed the diagnosis. PET scan on April 14, 2020 revealed a hypermetabolic right sided retroperitoneal nodal mass along with hypermetabolic mesenteric adenopathy.  Patient also noted to have bilateral axillary adenopathy.  Although patient was asymptomatic and active surveillance was offered, she elected to pursue treatment and received IV Rituxan weekly x4 completing on May 28, 2020.  Repeat PET scan from August 24, 2020 reviewed independently with significant treatment response, although likely residual disease.  Patient previously declined maintenance  Rituxan.   ASSESSMENT: Recurrent stage III follicular lymphoma, grade 1/2.    PLAN:    Recurrent stage III follicular lymphoma, grade 1/2:  Repeat imaging on March 15, 2021 reviewed independently with progression of disease.  Patient subsequently reinitiated treatment and received weekly Rituxan x 4 between March 31, 2021 and April 21, 2021.  She then initiated maintenance Rituxan every 8 weeks on Jun 23, 2021 and completed treatment on May 03, 2023. Repeat CT scan on October 21, 2022 reviewed independently with continued improvement of disease burden.  No further intervention is needed.  Return to clinic in 3 months with repeat imaging and further evaluation. Hypermetabolic thyroid nodule: Previously, biopsy was negative for malignancy. Depression: Significantly improved.  Continue current medications as prescribed. Hot flashes: Intermittent.  Given improvement of disease burden, unlikely related to underlying malignancy.  I spent a total of 30 minutes reviewing chart data, face-to-face evaluation with the patient, counseling and coordination of care as detailed above.   Patient expressed understanding and was in agreement with this plan. She also understands that She can call clinic at any time with any questions, concerns, or complaints.    Cancer Staging  Follicular lymphoma (HCC) Staging form: Hodgkin and Non-Hodgkin Lymphoma, AJCC 8th Edition - Clinical stage from 04/29/2020: Stage III (Follicular lymphoma) - Signed by Jeralyn Ruths, MD on 04/29/2020 Stage prefix: Initial diagnosis   Jeralyn Ruths, MD   05/04/2023 8:23 AM

## 2023-05-04 ENCOUNTER — Encounter: Payer: Self-pay | Admitting: Family Medicine

## 2023-05-04 ENCOUNTER — Other Ambulatory Visit: Payer: Self-pay

## 2023-05-04 ENCOUNTER — Encounter: Payer: Self-pay | Admitting: Oncology

## 2023-05-08 ENCOUNTER — Other Ambulatory Visit: Payer: Self-pay | Admitting: Family Medicine

## 2023-05-08 DIAGNOSIS — Z1231 Encounter for screening mammogram for malignant neoplasm of breast: Secondary | ICD-10-CM

## 2023-05-29 ENCOUNTER — Other Ambulatory Visit (HOSPITAL_COMMUNITY): Payer: Self-pay

## 2023-05-29 ENCOUNTER — Ambulatory Visit
Admission: RE | Admit: 2023-05-29 | Discharge: 2023-05-29 | Disposition: A | Discharge status: Home / Self Care | Source: Ambulatory Visit | Attending: Family Medicine | Admitting: Family Medicine

## 2023-05-29 ENCOUNTER — Telehealth: Payer: Self-pay

## 2023-05-29 DIAGNOSIS — Z1231 Encounter for screening mammogram for malignant neoplasm of breast: Secondary | ICD-10-CM | POA: Insufficient documentation

## 2023-05-29 NOTE — Telephone Encounter (Signed)
 Prolia VOB initiated via [email or web address]  Next Prolia inj DUE: 06/27/23

## 2023-05-30 ENCOUNTER — Other Ambulatory Visit (HOSPITAL_COMMUNITY): Payer: Self-pay

## 2023-05-30 NOTE — Telephone Encounter (Signed)
 Pt ready for scheduling for PROLIA  on or after : 06/27/23  Option# 1: Buy/Bill (Office supplied medication)  Out-of-pocket cost due at time of clinic visit: $357  Number of injection/visits approved: 2  Primary: [employer] Prolia  co-insurance: 20% Admin fee co-insurance: 20%  Secondary: --- Prolia  co-insurance:  Admin fee co-insurance:   Medical Benefit Details: Date Benefits were checked: 05/29/23 Deductible: NO/ Coinsurance: 20%/ Admin Fee: 20%  Prior Auth: APPROVED PA# [id number] Expiration Date: 11/23/22-11/23/23   # of doses approved: 2 ----------------------------------------------------------------------- Option# 2- Med Obtained from pharmacy:  Pharmacy benefit: Copay $645.79 (Paid to pharmacy) Admin Fee: 20% (Pay at clinic)  Prior Auth: N/A PA# Expiration Date:   # of doses approved:   If patient wants fill through the pharmacy benefit please send prescription to: [employer], and include estimated need by date in rx notes. Pharmacy will ship medication directly to the office.  Patient NOT eligible for Prolia  Copay Card. Copay Card can make patient's cost as little as $25. Link to apply: [email or web address]://[email or web address].[email or web address]  ** This summary of benefits is an estimation of the patient's out-of-pocket cost. Exact cost may very based on individual plan coverage.

## 2023-05-30 NOTE — Telephone Encounter (Signed)
 Melinda Hall

## 2023-06-12 ENCOUNTER — Other Ambulatory Visit: Payer: Self-pay | Admitting: Family Medicine

## 2023-06-12 DIAGNOSIS — G8929 Other chronic pain: Secondary | ICD-10-CM

## 2023-06-12 NOTE — Telephone Encounter (Signed)
 Plz address in Dr Ocie Belt absence.  Naproxen  Last filled:  04/19/23, #40 Last OV:  04/19/23, Welcome 2 MCR Next OV:  none

## 2023-07-24 ENCOUNTER — Ambulatory Visit
Admission: RE | Admit: 2023-07-24 | Discharge: 2023-07-24 | Disposition: A | Discharge status: Home / Self Care | Source: Ambulatory Visit | Attending: Oncology | Admitting: Oncology

## 2023-07-24 DIAGNOSIS — E042 Nontoxic multinodular goiter: Secondary | ICD-10-CM | POA: Diagnosis not present

## 2023-07-24 DIAGNOSIS — C8218 Follicular lymphoma grade II, lymph nodes of multiple sites: Secondary | ICD-10-CM | POA: Insufficient documentation

## 2023-07-24 DIAGNOSIS — K7689 Other specified diseases of liver: Secondary | ICD-10-CM | POA: Diagnosis not present

## 2023-07-24 DIAGNOSIS — K760 Fatty (change of) liver, not elsewhere classified: Secondary | ICD-10-CM | POA: Diagnosis not present

## 2023-07-24 DIAGNOSIS — D1809 Hemangioma of other sites: Secondary | ICD-10-CM | POA: Diagnosis not present

## 2023-07-24 MED ORDER — IOHEXOL 300 MG/ML  SOLN
100.0000 mL | Freq: Once | INTRAMUSCULAR | Status: AC | PRN
  Administered 2023-07-24: 100 mL via INTRAVENOUS

## 2023-08-02 ENCOUNTER — Inpatient Hospital Stay: Attending: Oncology

## 2023-08-02 ENCOUNTER — Other Ambulatory Visit: Payer: Self-pay

## 2023-08-02 DIAGNOSIS — C8218 Follicular lymphoma grade II, lymph nodes of multiple sites: Secondary | ICD-10-CM

## 2023-08-02 DIAGNOSIS — Z79899 Other long term (current) drug therapy: Secondary | ICD-10-CM | POA: Insufficient documentation

## 2023-08-02 DIAGNOSIS — C8213 Follicular lymphoma grade II, intra-abdominal lymph nodes: Secondary | ICD-10-CM | POA: Insufficient documentation

## 2023-08-02 LAB — CBC WITH DIFFERENTIAL/PLATELET
Abs Immature Granulocytes: 0.02 10*3/uL (ref 0.00–0.07)
Basophils Absolute: 0 10*3/uL (ref 0.0–0.1)
Basophils Relative: 1 %
Eosinophils Absolute: 0.4 10*3/uL (ref 0.0–0.5)
Eosinophils Relative: 5 %
HCT: 43.7 % (ref 36.0–46.0)
Hemoglobin: 14.1 g/dL (ref 12.0–15.0)
Immature Granulocytes: 0 %
Lymphocytes Relative: 22 %
Lymphs Abs: 1.6 10*3/uL (ref 0.7–4.0)
MCH: 29.6 pg (ref 26.0–34.0)
MCHC: 32.3 g/dL (ref 30.0–36.0)
MCV: 91.8 fL (ref 80.0–100.0)
Monocytes Absolute: 0.8 10*3/uL (ref 0.1–1.0)
Monocytes Relative: 10 %
Neutro Abs: 4.6 10*3/uL (ref 1.7–7.7)
Neutrophils Relative %: 62 %
Platelets: 245 10*3/uL (ref 150–400)
RBC: 4.76 MIL/uL (ref 3.87–5.11)
RDW: 13.7 % (ref 11.5–15.5)
WBC: 7.4 10*3/uL (ref 4.0–10.5)
nRBC: 0 % (ref 0.0–0.2)

## 2023-08-02 LAB — CMP (CANCER CENTER ONLY)
ALT: 50 U/L — ABNORMAL HIGH (ref 0–44)
AST: 31 U/L (ref 15–41)
Albumin: 3.8 g/dL (ref 3.5–5.0)
Alkaline Phosphatase: 56 U/L (ref 38–126)
Anion gap: 6 (ref 5–15)
BUN: 17 mg/dL (ref 8–23)
CO2: 26 mmol/L (ref 22–32)
Calcium: 9.1 mg/dL (ref 8.9–10.3)
Chloride: 106 mmol/L (ref 98–111)
Creatinine: 0.7 mg/dL (ref 0.44–1.00)
GFR, Estimated: >60 mL/min (ref >60)
Glucose, Bld: 93 mg/dL (ref 70–99)
Potassium: 5.4 mmol/L — ABNORMAL HIGH (ref 3.5–5.1)
Sodium: 138 mmol/L (ref 135–145)
Total Bilirubin: 1 mg/dL (ref 0.0–1.2)
Total Protein: 6.1 g/dL — ABNORMAL LOW (ref 6.5–8.1)

## 2023-08-09 ENCOUNTER — Inpatient Hospital Stay: Admitting: Oncology

## 2023-08-09 ENCOUNTER — Other Ambulatory Visit

## 2023-08-09 ENCOUNTER — Encounter: Payer: Self-pay | Admitting: Oncology

## 2023-08-09 VITALS — BP 118/86 | HR 65 | Temp 96.8°F | Resp 16 | Wt 163.8 lb

## 2023-08-09 DIAGNOSIS — C8218 Follicular lymphoma grade II, lymph nodes of multiple sites: Secondary | ICD-10-CM

## 2023-08-09 DIAGNOSIS — Z79899 Other long term (current) drug therapy: Secondary | ICD-10-CM | POA: Diagnosis not present

## 2023-08-09 DIAGNOSIS — C8213 Follicular lymphoma grade II, intra-abdominal lymph nodes: Secondary | ICD-10-CM | POA: Diagnosis not present

## 2023-08-09 NOTE — Progress Notes (Signed)
 [facility]  [phone]) [phone] [phone]) [phone]  ID: [name] OB: 1960/05/11  MR#: [id number]  RDW#:[id number]  Patient Care Team: Rilla Baller, MD as PCP - General (Family Medicine) Jacobo Evalene PARAS, MD as Consulting Physician (Oncology) Bluford Standing, MD as Consulting Physician (Neurosurgery)  CHIEF COMPLAINT: Recurrent stage III follicular lymphoma, grade 1/2.    INTERVAL HISTORY: Patient returns to clinic today for further evaluation and discussion of her imaging results.  She currently feels well and is asymptomatic.  She does not complain of any further hot flashes.  She denies any recent fevers or illnesses.  She has a good appetite and denies weight loss.  She has no neurologic complaints.  She does not complain of any weakness or fatigue today. She has no chest pain, shortness of breath, cough, or hemoptysis.  She denies any nausea, vomiting, constipation, or diarrhea.  She has no urinary complaints.  Patient offers no specific complaints today.  REVIEW OF SYSTEMS:   Review of Systems  Constitutional: Negative.  Negative for diaphoresis, fever, malaise/fatigue and weight loss.  Respiratory: Negative.  Negative for cough, hemoptysis and shortness of breath.   Cardiovascular: Negative.  Negative for chest pain and leg swelling.  Gastrointestinal: Negative.  Negative for abdominal pain.  Genitourinary: Negative.  Negative for dysuria.  Musculoskeletal: Negative.  Negative for back pain and myalgias.  Skin: Negative.  Negative for rash.  Neurological: Negative.  Negative for tingling, sensory change, focal weakness, weakness and headaches.  Psychiatric/Behavioral: Negative.  Negative for depression. The patient is not nervous/anxious.     As per HPI. Otherwise, a complete review of systems is negative.  PAST MEDICAL HISTORY: Past Medical History:  Diagnosis Date   Cancer (HCC)    Dermoid tumor    hx of--left ovary   Follicular lymphoma  (HCC)    Gastric bypass status for obesity 2004   s/p roux-en-y   Generalized headaches    frequent   Heart murmur    History of cardiac murmur    Intraductal papilloma of breast, left 06/18/2017   05/2017 - s/p excision 07/2017   Lymphoma (HCC)    Osteoporosis 03/2016   DEXA -2.6 spine   Salivary gland stone    x 2   Smoker     PAST SURGICAL HISTORY: Past Surgical History:  Procedure Laterality Date   ABDOMINAL HYSTERECTOMY     only ovary was removed; supracx   ANTERIOR CERVICAL DECOMP/DISCECTOMY FUSION  12/2016   Pool   APPENDECTOMY  1980   BILATERAL OOPHORECTOMY  ~1992   for dermoid ovarian cysts   BREAST BIOPSY Left 06/13/2017   papilloma with excision 07/2017   BREAST LUMPECTOMY WITH NEEDLE LOCALIZATION Left 08/10/2017   intraductal papilloma - Jordis Laneta FALCON, MD   CHOLECYSTECTOMY  2000s   COLONOSCOPY WITH PROPOFOL  N/A 11/27/2015   TA rpt 5 yrs Clair Copping, MD)   COLONOSCOPY WITH PROPOFOL  N/A 05/17/2021   TAs, SSPs, rpt 3 yrs Renne, Darren, MD)   GASTRIC BYPASS  2004   roux en y [facility])   POLYPECTOMY  11/27/2015   Procedure: POLYPECTOMY;  Surgeon: Rogelia Copping, MD;  Location: [facility];  Service: Endoscopy;;   POLYPECTOMY  05/17/2021   Procedure: POLYPECTOMY;  Surgeon: Copping Rogelia, MD;  Location: [facility];  Service: Endoscopy;;   REDUCTION MAMMAPLASTY Bilateral 1990's   SALIVARY GLAND SURGERY     SALIVARY STONE REMOVAL      FAMILY HISTORY: Family History  Problem Relation Age of Onset  Hypertension Mother    Cancer Father 88       leukemia   Cancer Brother 19       kidney   Diabetes Neg Hx    Stroke Neg Hx    Coronary artery disease Neg Hx    Breast cancer Neg Hx     ADVANCED DIRECTIVES (Y/N):  N  HEALTH MAINTENANCE: Social History   Tobacco Use   Smoking status: Former    Current packs/day: 0.00    Average packs/day: 0.3 packs/day for 23.3 years (6.0 ttl pk-yrs)    Types: Cigarettes    Start date: 10/31/1992    Quit  date: 10/31/2012    Years since quitting: 10.7   Smokeless tobacco: Never  Vaping Use   Vaping status: Never Used  Substance Use Topics   Alcohol use: Not Currently   Drug use: No     Colonoscopy:  PAP:  Bone density:  Lipid panel:  Allergies  Allergen Reactions   Boniva [Ibandronate] Other (See Comments)    Diffuse body aches    Current Outpatient Medications  Medication Sig Dispense Refill   buPROPion  (WELLBUTRIN  SR) 150 MG 12 hr tablet Take 1 tablet (150 mg total) by mouth in the morning. 90 tablet 4   calcium carbonate (OS-CAL) 600 MG TABS tablet Take 600 mg by mouth daily with breakfast.     Cholecalciferol  (VITAMIN D3) 1000 units CAPS Take 3 capsules (3,000 Units total) by mouth daily.     estradiol  (ESTRACE  VAGINAL) 0.1 MG/GM vaginal cream Insert 1 g vaginally nightly for 1 week, then 1-2 times wkly as maintenance 42.5 g 1   ferrous sulfate  325 (65 FE) MG tablet Take 1 tablet (325 mg total) by mouth every Monday, Wednesday, and Friday.     hydrOXYzine  (ATARAX ) 25 MG tablet Take 0.5-1 tablets (12.5-25 mg total) by mouth 3 (three) times daily as needed. 30 tablet 0   Magnesium  250 MG TABS Take 1 tablet (250 mg total) by mouth daily.  0   naproxen  (NAPROSYN ) 500 MG tablet TAKE 1 TABLET (500 MG TOTAL) BY MOUTH 2 (TWO) TIMES DAILY WITH A MEAL. FOR 1 WEEK THEN AS NEEDED 40 tablet 0   nitroGLYCERIN  (NITRODUR - DOSED IN MG/24 HR) 0.2 mg/hr patch APPLY 1 PATCH TO AFFECTED AREA AS DIRECTED BY MD AND CHANGE EVERY 24 HOURS. 90 patch 1   vitamin B-12 (CYANOCOBALAMIN ) 1000 MCG tablet Take 1 tablet (1,000 mcg total) by mouth daily.     zinc  gluconate 50 MG tablet Take 1 tablet (50 mg total) by mouth daily.     Current Facility-Administered Medications  Medication Dose Route Frequency Provider Last Rate Last Admin   denosumab  (PROLIA ) injection 60 mg  60 mg Subcutaneous Once Rilla Baller, MD        OBJECTIVE: Vitals:   08/09/23 0912  BP: 118/86  Pulse: 65  Resp: 16  Temp: (!)  96.8 F (36 C)  SpO2: 100%     Body mass index is 29.48 kg/m.    ECOG FS:0 - Asymptomatic  General: Well-developed, well-nourished, no acute distress. Eyes: Pink conjunctiva, anicteric sclera. HEENT: Normocephalic, moist mucous membranes. Lungs: No audible wheezing or coughing. Heart: Regular rate and rhythm. Abdomen: Soft, nontender, no obvious distention. Musculoskeletal: No edema, cyanosis, or clubbing. Neuro: Alert, answering all questions appropriately. Cranial nerves grossly intact. Skin: No rashes or petechiae noted. Psych: Normal affect.  LAB RESULTS:  Lab Results  Component Value Date   NA 138 08/02/2023   K 5.4 (  H) 08/02/2023   CL 106 08/02/2023   CO2 26 08/02/2023   GLUCOSE 93 08/02/2023   BUN 17 08/02/2023   CREATININE 0.70 08/02/2023   CALCIUM 9.1 08/02/2023   PROT 6.1 (L) 08/02/2023   ALBUMIN 3.8 08/02/2023   AST 31 08/02/2023   ALT 50 (H) 08/02/2023   ALKPHOS 56 08/02/2023   BILITOT 1.0 08/02/2023   GFRNONAA >60 08/02/2023   GFRAA >60 07/10/2013    Lab Results  Component Value Date   WBC 7.4 08/02/2023   NEUTROABS 4.6 08/02/2023   HGB 14.1 08/02/2023   HCT 43.7 08/02/2023   MCV 91.8 08/02/2023   PLT 245 08/02/2023     STUDIES: CT CHEST ABDOMEN PELVIS W CONTRAST Result Date: 07/24/2023 CLINICAL DATA:  History of lymphoma, follow-up. * Tracking Code: BO * EXAM: CT CHEST, ABDOMEN, AND PELVIS WITH CONTRAST TECHNIQUE: Multidetector CT imaging of the chest, abdomen and pelvis was performed following the standard protocol during bolus administration of intravenous contrast. RADIATION DOSE REDUCTION: This exam was performed according to the departmental dose-optimization program which includes automated exposure control, adjustment of the mA and/or kV according to patient size and/or use of iterative reconstruction technique. CONTRAST:  OMNIPAQUE  IOHEXOL  300 MG/ML  SOLN COMPARISON:  Multiple priors including CT October 21, 2022 FINDINGS: CT CHEST  FINDINGS Cardiovascular: Normal caliber thoracic aorta. Normal size heart. No significant pericardial effusion/thickening. Mediastinum/Nodes: Similar size of the thyroid  nodules measuring up to 16 mm previously assessed by thyroid  ultrasound and FNA. Similar minimal wispy soft tissue in the anterior mediastinum compatible with thymic rebound. No pathologically enlarged mediastinal, hilar or axillary lymph nodes. Lungs/Pleura: No suspicious pulmonary nodules or masses. Musculoskeletal: No aggressive lytic or blastic lesion of bone. Multilevel degenerative changes spine. CT ABDOMEN PELVIS FINDINGS Hepatobiliary: Subcentimeter bilobar hypodense hepatic lesions are stable from prior and favored to reflect cysts. Diffuse hepatic steatosis. Gallbladder surgically absent. Similar prominence of the biliary tree within normal limits for reservoir effect post cholecystectomy. Pancreas: No pancreatic ductal dilation or evidence of acute inflammation. Spleen: No splenomegaly. Adrenals/Urinary Tract: Bilateral adrenal glands appear normal. No hydronephrosis. Kidneys demonstrate symmetric enhancement. Urinary bladder is unremarkable for degree of distension. Stomach/Bowel: Prior gastric bypass surgery. No evidence of bowel obstruction. No acute bowel inflammation. Vascular/Lymphatic: Normal caliber abdominal aorta. Smooth IVC contours. The portal, splenic and superior mesenteric veins are patent. Decreased size of the retroperitoneal nodal tissue at the level of the renal veins now measuring 20 x 14 mm on image 57/2 previously 2.8 x 1.9 cm. No new pathologically enlarged abdominal or pelvic lymph nodes. Reproductive: Uterus and bilateral adnexa are unremarkable. Other: No significant abdominopelvic free fluid. Musculoskeletal: No aggressive lytic or blastic lesion of bone. L3 vertebral body hemangioma. Multilevel degenerative changes spine. IMPRESSION: 1. Decreased size of the retroperitoneal nodal tissue at the level of the  renal veins. No new pathologically enlarged lymph nodes in the chest, abdomen or pelvis. 2. No splenomegaly. 3. Diffuse hepatic steatosis. Electronically Signed   By: Reyes Holder M.D.   On: 07/24/2023 14:34    ONCOLOGY HISTORY: Retroperitoneal lymph node biopsy on April 21, 2020 confirmed the diagnosis. PET scan on April 14, 2020 revealed a hypermetabolic right sided retroperitoneal nodal mass along with hypermetabolic mesenteric adenopathy.  Patient also noted to have bilateral axillary adenopathy.  Although patient was asymptomatic and active surveillance was offered, she elected to pursue treatment and received IV Rituxan  weekly x4 completing on May 28, 2020.  Repeat PET scan from August 24, 2020 reviewed independently  with significant treatment response, although likely residual disease.  Patient previously declined maintenance Rituxan .   ASSESSMENT: Recurrent stage III follicular lymphoma, grade 1/2.    PLAN:    Recurrent stage III follicular lymphoma, grade 1/2: Previously, patient reinitiated treatment and received weekly Rituxan  x 4 between March 31, 2021 and April 21, 2021.  She then initiated maintenance Rituxan  every 8 weeks on Jun 23, 2021 and completed treatment on May 03, 2023.  Her most recent CT scan on July 24, 2023 reviewed independently and reported as above no obvious evidence of recurrent or progressive disease.  Will continue routine imaging every 6 months for at least 2 years after completing treatment through April 2027 then transition to yearly imaging.  Return to clinic in 6 months with repeat imaging and further evaluation.   Hypermetabolic thyroid  nodule: Previously, biopsy was negative for malignancy. Depression: Significantly improved.  Continue current medications as prescribed. Hot flashes: Resolved.    Patient expressed understanding and was in agreement with this plan. She also understands that She can call clinic at any time with any questions, concerns, or  complaints.    Cancer Staging  Follicular lymphoma (HCC) Staging form: Hodgkin and Non-Hodgkin Lymphoma, AJCC 8th Edition - Clinical stage from 04/29/2020: Stage III (Follicular lymphoma) - Signed by Jacobo Evalene PARAS, MD on 04/29/2020 Stage prefix: Initial diagnosis   Evalene PARAS Jacobo, MD   08/09/2023 9:22 AM

## 2023-08-11 ENCOUNTER — Other Ambulatory Visit: Payer: Self-pay

## 2023-08-15 ENCOUNTER — Telehealth: Payer: Self-pay

## 2023-08-15 NOTE — Telephone Encounter (Signed)
 Copied from CRM [id number]. Topic: Clinical - Medical Advice >> Aug 15, 2023  3:23 PM Burnard DEL wrote: Reason for CRM: Patient called in inquiring about her prolia  injection.She stated that she was due for it around May and would like to know if she can go ahead and get scheduled for it?

## 2023-08-22 NOTE — Telephone Encounter (Signed)
 LM for pt to return call via Spanish interpreter.

## 2023-08-24 ENCOUNTER — Telehealth: Payer: Self-pay | Admitting: Family Medicine

## 2023-08-24 DIAGNOSIS — M810 Age-related osteoporosis without current pathological fracture: Secondary | ICD-10-CM

## 2023-08-24 MED ORDER — DENOSUMAB 60 MG/ML ~~LOC~~ SOSY: 60.0000 mg | PREFILLED_SYRINGE | SUBCUTANEOUS | Status: DC

## 2023-08-24 NOTE — Addendum Note (Signed)
 Addended by: ALBINO SHAVER C on: 08/24/2023 03:48 PM   Modules accepted: Orders

## 2023-08-24 NOTE — Telephone Encounter (Signed)
 Spoke with pt. She is trying to get her prolia  injection. Pt was due for her injection in May. I attempted to contact her several times to make that appointment but I could not get her on the phone. Advised pt that I would have to get your pharmacy team to rerun her benefits to see what her out of pocket cost would be. Pt verbalized understanding. Referral has been placed for benefits verification.

## 2023-08-24 NOTE — Telephone Encounter (Signed)
 Patient arrived in office today to ask about prolia  patient ask that some one speak to her if need to call please call [phone] to discuss with patient states is due for one next week and it has not been scheduled. She states she will be out of town all of August.

## 2023-08-29 NOTE — Telephone Encounter (Signed)
 Spoke with pt about her out of pocket cost for Prolia . She owes $357. This is not something that she can afford right now. She would like to know if there is another alternative.

## 2023-09-01 MED ORDER — ALENDRONATE SODIUM 70 MG PO TABS: 70.0000 mg | ORAL_TABLET | ORAL | 11 refills | Status: DC

## 2023-09-01 NOTE — Telephone Encounter (Signed)
 Alternatively we could do bisphosphonate - either oral pills weekly Fosamax  or monthly Boniva or q yearly infusion through [facility] infusion center [facility] Let me know which she would prefer

## 2023-09-01 NOTE — Telephone Encounter (Addendum)
 Fosamax  sent to pharmacy Administer first thing in the morning and >=30 minutes before the first food, beverage (except plain water ), or other medication(s) of the day. Do not take with mineral water  or with other beverages. Patients should be instructed to stay upright (not to lie down) for >=30 minutes and until after first food of the day (to reduce esophageal irritation).   Noted boniva intolerance - to monitor for body aches.

## 2023-09-01 NOTE — Telephone Encounter (Signed)
 Pt notified as instructed and pt voiced understanding. Pt said she had taken Fosamax  wkly before and would like to try taking Fosamax  again. Pt request Fosamax  sent to [facility] today because pt is leaving on 09/02/23 for vacation and will not be back home for 1 month. Sending note to [facility] and will let Dr KANDICE be aware.

## 2023-09-01 NOTE — Addendum Note (Signed)
 Addended by: RILLA BALLER on: 09/01/2023 03:40 PM   Modules accepted: Orders

## 2023-09-01 NOTE — Telephone Encounter (Signed)
 Pt notified as instructed and pt voiced understanding. Pt appreciative Dr KANDICE sending in med today.

## 2023-09-17 ENCOUNTER — Other Ambulatory Visit: Payer: Self-pay | Admitting: Obstetrics and Gynecology

## 2023-09-17 DIAGNOSIS — N941 Unspecified dyspareunia: Secondary | ICD-10-CM

## 2023-09-17 DIAGNOSIS — N952 Postmenopausal atrophic vaginitis: Secondary | ICD-10-CM

## 2023-10-17 ENCOUNTER — Encounter: Payer: Self-pay | Admitting: Family Medicine

## 2023-10-17 ENCOUNTER — Ambulatory Visit: Admitting: Family Medicine

## 2023-10-17 VITALS — BP 118/80 | HR 78 | Temp 97.9°F | Ht 62.5 in | Wt 170.2 lb

## 2023-10-17 DIAGNOSIS — M7661 Achilles tendinitis, right leg: Secondary | ICD-10-CM | POA: Diagnosis not present

## 2023-10-17 DIAGNOSIS — C8210 Follicular lymphoma grade II, unspecified site: Secondary | ICD-10-CM | POA: Diagnosis not present

## 2023-10-17 DIAGNOSIS — R1310 Dysphagia, unspecified: Secondary | ICD-10-CM | POA: Diagnosis not present

## 2023-10-17 DIAGNOSIS — R0989 Other specified symptoms and signs involving the circulatory and respiratory systems: Secondary | ICD-10-CM | POA: Diagnosis not present

## 2023-10-17 DIAGNOSIS — M810 Age-related osteoporosis without current pathological fracture: Secondary | ICD-10-CM

## 2023-10-17 DIAGNOSIS — Z9884 Bariatric surgery status: Secondary | ICD-10-CM

## 2023-10-17 MED ORDER — OMEPRAZOLE 20 MG PO CPDR
20.0000 mg | DELAYED_RELEASE_CAPSULE | Freq: Every day | ORAL | 1 refills | Status: DC
Start: 2023-10-17 — End: 2023-11-09

## 2023-10-17 NOTE — Patient Instructions (Addendum)
 Haga cita con el Dr Copland para evaluar tendonitis de nuevo.   Tome omeprazole  20mg  diarios por 2-3 semanas (medicina anti reflujo).  Limite naprosyn .  Si no mejora, dejeme saber para evaluacion de tragar y remitirla a Programmer, applications.  Gusto verla hoy. Dejeme saber como sigue con estos sintomas.

## 2023-10-17 NOTE — Progress Notes (Unsigned)
 Ph: [phone] Fax: [phone]   Patient ID: Melinda Hall, female    DOB: [birth year], 63 y.o.   MRN: [id number]  This visit was conducted in person.  BP 118/80   Pulse 78   Temp 97.9 F (36.6 C) (Oral)   Ht 5' 2.5 (1.588 m)   Wt 170 lb 4 oz (77.2 kg)   SpO2 97%   BMI 30.64 kg/m    CC: 41mo f/u visit  Subjective:   HPI: Melinda Hall is a 63 y.o. female presenting on 10/17/2023 for Medical Management of Chronic Issues (Pt here for a f/u visit)   Recent brother's death was very hard on her.  Mom is currently in [place].  Osteoporosis - was on prolia  - became unaffordable. Started Fosamax  70mg  weekly 09/2023.   Follicular lymphoma s/p Rituxan  maintenance chemo completed earlier this year Melinda Hall). Planned Q85mo monitoring with imaging and onc f/u  2 wk h/o constant throat clearing. 2d of hoarse voice.  No cold symptoms, ST, cough, phlegm, malaise, fevers/chills.  No allergic rhinitis.  No reflux symptoms.  H/o thyroid  nodules - benign biopsy. Doesn't feel these are enlarging.  She recently started fosamax  weekly. (08/2023) - did not feel well after first dose, but has done well after subsequent doses.   3 wks ago had 2 separate episode of feeling of food getting stuck after a meal, this led to vomiting with resolution of symptoms. Worse symptoms with rice, bread, pasta. This is chronic issue since her gastric bypass.  + early satiety  H/o gastric bypass 2004 (Roux-en-y).  COLONOSCOPY WITH PROPOFOL  05/17/2021 - TAs, SSPs, rpt 3 yrs (Melinda Hall, Darren, MD) EGD - never.   Continues nitroglycerin  patches for chronic R achilles heel tendonitis. Last saw sports medicine 03/2023.  She uses naprosyn  very rarely.      Relevant past medical, surgical, family and social history reviewed and updated as indicated. Interim medical history since our last visit reviewed. Allergies and medications reviewed and updated. Outpatient Medications Prior to Visit  Medication Sig Dispense  Refill   buPROPion  (WELLBUTRIN  SR) 150 MG 12 hr tablet Take 1 tablet (150 mg total) by mouth in the morning. 90 tablet 4   calcium carbonate (OS-CAL) 600 MG TABS tablet Take 600 mg by mouth daily with breakfast.     Cholecalciferol  (VITAMIN D3) 1000 units CAPS Take 3 capsules (3,000 Units total) by mouth daily.     estradiol  (ESTRACE  VAGINAL) 0.1 MG/GM vaginal cream Insert 1 g vaginally nightly for 1 week, then 1-2 times wkly as maintenance 42.5 g 1   ferrous sulfate  325 (65 FE) MG tablet Take 1 tablet (325 mg total) by mouth every Monday, Wednesday, and Friday.     Magnesium  250 MG TABS Take 1 tablet (250 mg total) by mouth daily.  0   naproxen  (NAPROSYN ) 500 MG tablet TAKE 1 TABLET (500 MG TOTAL) BY MOUTH 2 (TWO) TIMES DAILY WITH A MEAL. FOR 1 WEEK THEN AS NEEDED 40 tablet 0   nitroGLYCERIN  (NITRODUR - DOSED IN MG/24 HR) 0.2 mg/hr patch APPLY 1 PATCH TO AFFECTED AREA AS DIRECTED BY MD AND CHANGE EVERY 24 HOURS. 90 patch 1   vitamin B-12 (CYANOCOBALAMIN ) 1000 MCG tablet Take 1 tablet (1,000 mcg total) by mouth daily.     zinc  gluconate 50 MG tablet Take 1 tablet (50 mg total) by mouth daily.     alendronate  (FOSAMAX ) 70 MG tablet Take 1 tablet (70 mg total) by mouth every 7 (seven) days. Take with a full  glass of water  on an empty stomach. 4 tablet 11   hydrOXYzine  (ATARAX ) 25 MG tablet Take 0.5-1 tablets (12.5-25 mg total) by mouth 3 (three) times daily as needed. 30 tablet 0   No facility-administered medications prior to visit.     Per HPI unless specifically indicated in ROS section below Review of Systems  Objective:  BP 118/80   Pulse 78   Temp 97.9 F (36.6 C) (Oral)   Ht 5' 2.5 (1.588 m)   Wt 170 lb 4 oz (77.2 kg)   SpO2 97%   BMI 30.64 kg/m   Wt Readings from Last 3 Encounters:  10/17/23 170 lb 4 oz (77.2 kg)  08/09/23 163 lb 12.8 oz (74.3 kg)  05/03/23 165 lb (74.8 kg)      Physical Exam Vitals and nursing note reviewed.  Constitutional:      Appearance: Normal  appearance. She is not ill-appearing.  HENT:     Head: Normocephalic and atraumatic.     Nose: Nose normal. No congestion.     Mouth/Throat:     Mouth: Mucous membranes are moist.     Pharynx: Oropharynx is clear. No oropharyngeal exudate or posterior oropharyngeal erythema.  Eyes:     Extraocular Movements: Extraocular movements intact.     Conjunctiva/sclera: Conjunctivae normal.     Pupils: Pupils are equal, round, and reactive to light.  Neck:     Thyroid : No thyroid  mass, thyromegaly or thyroid  tenderness.  Cardiovascular:     Rate and Rhythm: Normal rate and regular rhythm.     Pulses: Normal pulses.     Heart sounds: Normal heart sounds. No murmur heard. Pulmonary:     Effort: Pulmonary effort is normal. No respiratory distress.     Breath sounds: Normal breath sounds. No wheezing, rhonchi or rales.  Musculoskeletal:     Cervical back: Normal range of motion and neck supple.     Right lower leg: No edema.     Left lower leg: No edema.  Lymphadenopathy:     Cervical: No cervical adenopathy.  Skin:    General: Skin is warm and dry.     Findings: No rash.  Neurological:     Mental Status: She is alert.  Psychiatric:        Mood and Affect: Mood normal.        Behavior: Behavior normal.        Assessment & Plan:   Problem List Items Addressed This Visit     Status post gastric bypass for obesity (Chronic)   Relevant Orders   Ambulatory referral to Gastroenterology   Follicular lymphoma (HCC) (Chronic)   Completed Rituxan  maintenance chemo earlier this year. Overall feeling very well Will continue surveillance imaging q42mo x2 yrs.  Appreciate onc care      Osteoporosis   Was on prolia  since 2021 until it became unaffordable earlier this year. Transitioned to oral fosamax  last month, will stop oral fosamax  in h/o roux-en-y gastric bypass, offer yearly Reclast IV infusion through [facility] infusion center.       Tendonitis, Achilles, right   Had improved  earlier this year, now with recurrent symptoms despite regular nitroglycerin  patch use, even using 2 patches at once.  Rec sports med f/u.       Throat clearing - Primary   Present for the past 2 weeks, may be related to commencement of fosamax  - will stop.  No regular NSAID use.  Reassuring exam.  No signs of infection, no signs of  allergic rhinitis contribution.  Recommend treat as silent reflux with omeprazole  20mg  daily x 2-3 wks, update if ongoing for further evaluation.       Relevant Orders   Ambulatory referral to Gastroenterology   Dysphagia   Notes chronic issue with dysphagia to certain starchy solids (ie bread, pasta, rice) with episodes of vomiting to relieve symptoms. Ongoing issue since gastric bypass (roux en y) 2004.  Will start omeprazole  20mg  daily x 3 wks Will also place GI referral for EGD consideration.       Relevant Orders   Ambulatory referral to Gastroenterology     Meds ordered this encounter  Medications   omeprazole  (PRILOSEC) 20 MG capsule    Sig: Take 1 capsule (20 mg total) by mouth daily. Scheduled for 3 weeks then as needed    Dispense:  30 capsule    Refill:  1    Orders Placed This Encounter  Procedures   Ambulatory referral to Gastroenterology    Referral Priority:   Routine    Referral Type:   Consultation    Referral Reason:   Specialty Services Required    Number of Visits Requested:   1    Patient Instructions  Haga cita con el Dr Copland para evaluar tendonitis de nuevo.   Tome omeprazole  20mg  diarios por 2-3 semanas (medicina anti reflujo).  Limite naprosyn .  Si no mejora, dejeme saber para evaluacion de tragar y remitirla a Programmer, applications.  Gusto verla hoy. Dejeme saber como sigue con estos sintomas.   Follow up plan: No follow-ups on file.  Anton Blas, MD

## 2023-10-18 ENCOUNTER — Encounter: Payer: Self-pay | Admitting: Family Medicine

## 2023-10-18 ENCOUNTER — Telehealth: Payer: Self-pay | Admitting: Family Medicine

## 2023-10-18 DIAGNOSIS — R1310 Dysphagia, unspecified: Secondary | ICD-10-CM | POA: Insufficient documentation

## 2023-10-18 DIAGNOSIS — R0989 Other specified symptoms and signs involving the circulatory and respiratory systems: Secondary | ICD-10-CM | POA: Insufficient documentation

## 2023-10-18 NOTE — Assessment & Plan Note (Addendum)
 Completed Rituxan  maintenance chemo earlier this year. Overall feeling very well Will continue surveillance imaging q72mo x2 yrs.  Appreciate onc care

## 2023-10-18 NOTE — Assessment & Plan Note (Addendum)
 Present for the past 2 weeks, may be related to commencement of fosamax  - will stop.  No regular NSAID use.  Reassuring exam.  No signs of infection, no signs of allergic rhinitis contribution.  Recommend treat as silent reflux with omeprazole  20mg  daily x 2-3 wks, update if ongoing for further evaluation.

## 2023-10-18 NOTE — Assessment & Plan Note (Addendum)
 Had improved earlier this year, now with recurrent symptoms despite regular nitroglycerin  patch use, even using 2 patches at once.  Rec sports med f/u.

## 2023-10-18 NOTE — Assessment & Plan Note (Addendum)
 Notes chronic issue with dysphagia to certain starchy solids (ie bread, pasta, rice) with episodes of vomiting to relieve symptoms. Ongoing issue since gastric bypass (roux en y) 2004.  Will start omeprazole  20mg  daily x 3 wks Will also place GI referral for EGD consideration.

## 2023-10-18 NOTE — Telephone Encounter (Signed)
 Please notify - in history of her gastric bypass, recommend we stop oral weekly Fosamax . Would she be willing to do yearly IV Reclast infusion instead? Would be at the [facility] infusion center [employer].   Also, given dysphagia and occasional vomiting, do recommend GI evaluation - referral placed to GI in [place].

## 2023-10-18 NOTE — Assessment & Plan Note (Signed)
 Was on prolia  since 2021 until it became unaffordable earlier this year. Transitioned to oral fosamax  last month, will stop oral fosamax  in h/o roux-en-y gastric bypass, offer yearly Reclast IV infusion through [facility] infusion center.

## 2023-10-18 NOTE — Telephone Encounter (Signed)
 Called patient reviewed all information and repeated back to me. Will call if any questions.  She is ok with starting infusion.  If she has not heard from GI in two weeks she will let us  know so we can follow up on referral.

## 2023-10-20 ENCOUNTER — Other Ambulatory Visit: Payer: Self-pay | Admitting: Family Medicine

## 2023-10-20 NOTE — Telephone Encounter (Signed)
 Last office visit 10/17/23 for medical management of chronic issues.  Last refilled 04/25/23 for #90 with 1 refill by Dr. Watt.  Next Appt: No future appointment with PCP.

## 2023-10-31 NOTE — Progress Notes (Unsigned)
 10/31/2023 Melinda Hall [id number] Mar 21, 1960  Gastroenterology Office Note    Referring Provider: Rilla Baller, MD Primary Care Physician:  Rilla Baller, MD  Primary GI Provider: Jinny Carmine, MD    Chief Complaint   No chief complaint on file.    History of Present Illness   Melinda Hall is a 63 y.o. female with PMHX of follicular lymphoma, osteoporosis, gastric bypass 2004 (Roux-en-Y), presenting today at the request of Rilla Baller, MD due to    05/17/2021 colonoscopy - One 4 mm polyp in the cecum, removed with a cold snare. Tubular adenoma, negative for high-grade dysplasia and malignancy - Two 4 to 6 mm polyps in the transverse colon, removed with a cold snare.  Sessile serrated polyps, negative for dysplasia and malignancy. - One 4 mm polyp in the descending colon, removed with a cold snare.  Sessile serrated polyp, negative for dysplasia and malignancy - One 4 mm polyp in the sigmoid colon, removed with a cold snare. Tubular adenoma, negative for high-grade dysplasia and malignancy - Recommendation to repeat colonoscopy in 3 years  Past Medical History:  Diagnosis Date   Cancer (HCC)    Dermoid tumor    hx of--left ovary   Follicular lymphoma (HCC)    Gastric bypass status for obesity 2004   s/p roux-en-y   Generalized headaches    frequent   Heart murmur    History of cardiac murmur    Intraductal papilloma of breast, left 06/18/2017   05/2017 - s/p excision 07/2017   Lymphoma (HCC)    Osteoporosis 03/2016   DEXA -2.6 spine   Salivary gland stone    x 2   Smoker     Past Surgical History:  Procedure Laterality Date   ABDOMINAL HYSTERECTOMY     only ovary was removed; supracx   ANTERIOR CERVICAL DECOMP/DISCECTOMY FUSION  12/2016   Pool   APPENDECTOMY  1980   BILATERAL OOPHORECTOMY  ~1992   for dermoid ovarian cysts   BREAST BIOPSY Left 06/13/2017   papilloma with excision 07/2017   BREAST LUMPECTOMY WITH NEEDLE LOCALIZATION  Left 08/10/2017   intraductal papilloma - Pabon, Diego F, MD   CHOLECYSTECTOMY  2000s   COLONOSCOPY WITH PROPOFOL  N/A 11/27/2015   TA rpt 5 yrs Clair Jinny, MD)   COLONOSCOPY WITH PROPOFOL  N/A 05/17/2021   TAs, SSPs, rpt 3 yrs Renne, Darren, MD)   GASTRIC BYPASS  2004   roux en y [facility])   POLYPECTOMY  11/27/2015   Procedure: POLYPECTOMY;  Surgeon: Carmine Jinny, MD;  Location: [facility];  Service: Endoscopy;;   POLYPECTOMY  05/17/2021   Procedure: POLYPECTOMY;  Surgeon: Jinny Carmine, MD;  Location: [facility];  Service: Endoscopy;;   REDUCTION MAMMAPLASTY Bilateral 1990's   SALIVARY GLAND SURGERY     SALIVARY STONE REMOVAL      Current Outpatient Medications  Medication Sig Dispense Refill   buPROPion  (WELLBUTRIN  SR) 150 MG 12 hr tablet Take 1 tablet (150 mg total) by mouth in the morning. 90 tablet 4   calcium carbonate (OS-CAL) 600 MG TABS tablet Take 600 mg by mouth daily with breakfast.     Cholecalciferol  (VITAMIN D3) 1000 units CAPS Take 3 capsules (3,000 Units total) by mouth daily.     estradiol  (ESTRACE  VAGINAL) 0.1 MG/GM vaginal cream Insert 1 g vaginally nightly for 1 week, then 1-2 times wkly as maintenance 42.5 g 1   ferrous sulfate  325 (65 FE) MG tablet Take 1 tablet (325 mg total) by  mouth every Monday, Wednesday, and Friday.     Magnesium  250 MG TABS Take 1 tablet (250 mg total) by mouth daily.  0   naproxen  (NAPROSYN ) 500 MG tablet TAKE 1 TABLET (500 MG TOTAL) BY MOUTH 2 (TWO) TIMES DAILY WITH A MEAL. FOR 1 WEEK THEN AS NEEDED 40 tablet 0   nitroGLYCERIN  (NITRODUR - DOSED IN MG/24 HR) 0.2 mg/hr patch APPLY 1/4 PATCH TO AFFECTED AREA AS DIRECTED BY MD AND CHANGE EVERY 24 HOURS. 90 patch 1   omeprazole  (PRILOSEC) 20 MG capsule Take 1 capsule (20 mg total) by mouth daily. Scheduled for 3 weeks then as needed 30 capsule 1   vitamin B-12 (CYANOCOBALAMIN ) 1000 MCG tablet Take 1 tablet (1,000 mcg total) by mouth daily.     zinc  gluconate 50 MG tablet Take 1  tablet (50 mg total) by mouth daily.     No current facility-administered medications for this visit.    Allergies as of 11/01/2023 - Review Complete 10/18/2023  Allergen Reaction Noted   Boniva [ibandronate] Other (See Comments) 09/11/2019    Family History  Problem Relation Age of Onset   Hypertension Mother    Cancer Father 68       leukemia   Cancer Brother 23       kidney   Diabetes Neg Hx    Stroke Neg Hx    Coronary artery disease Neg Hx    Breast cancer Neg Hx     Social History   Socioeconomic History   Marital status: Married    Spouse name: Not on file   Number of children: Not on file   Years of education: Not on file   Highest education level: Some college, no degree  Occupational History   Not on file  Tobacco Use   Smoking status: Former    Current packs/day: 0.00    Average packs/day: 0.3 packs/day for 23.3 years (6.0 ttl pk-yrs)    Types: Cigarettes    Start date: 10/31/1992    Quit date: 10/31/2012    Years since quitting: 11.0   Smokeless tobacco: Never  Vaping Use   Vaping status: Never Used  Substance and Sexual Activity   Alcohol use: Not Currently   Drug use: No   Sexual activity: Yes    Birth control/protection: Surgical    Comment: Hysterectomy  Other Topics Concern   Not on file  Social History Narrative   Caffeine: 2 cups coffee/day   Lives with husband Dinah) and daughter, 3 dogs, 1 ferret   Occupation: Geologist, engineering at daycare   From [place]   Activity: no regular exercise, walks some at work   Diet: good water , fruits/vegetables daily   Social Drivers of Corporate investment banker Strain: Low Risk  (03/05/2023)   Overall Financial Resource Strain (CARDIA)    Difficulty of Paying Living Expenses: Not hard at all  Food Insecurity: No Food Insecurity (03/05/2023)   Hunger Vital Sign    Worried About Running Out of Food in the Last Year: Never true    Ran Out of Food in the Last Year: Never true  Transportation Needs:  No Transportation Needs (03/05/2023)   PRAPARE - Administrator, Civil Service (Medical): No    Lack of Transportation (Non-Medical): No  Physical Activity: Sufficiently Active (03/05/2023)   Exercise Vital Sign    Days of Exercise per Week: 5 days    Minutes of Exercise per Session: 40 min  Stress: No Stress Concern Present (  03/05/2023)   [employer] of Occupational Health - Occupational Stress Questionnaire    Feeling of Stress : Only a little  Social Connections: Moderately Isolated (03/05/2023)   Social Connection and Isolation Panel    Frequency of Communication with Friends and Family: More than three times a week    Frequency of Social Gatherings with Friends and Family: Once a week    Attends Religious Services: Never    Database administrator or Organizations: No    Attends Banker Meetings: Not on file    Marital Status: Married  Intimate Partner Violence: Unknown (04/05/2021)   Humiliation, Afraid, Rape, and Kick questionnaire    Fear of Current or Ex-Partner: No    Emotionally Abused: No    Physically Abused: No    Sexually Abused: Not on file     RELEVANT GI HISTORY, IMAGING AND LABS: CBC    Component Value Date/Time   WBC 7.4 08/02/2023 0830   RBC 4.76 08/02/2023 0830   HGB 14.1 08/02/2023 0830   HGB 11.8 (L) 07/10/2013 1121   HCT 43.7 08/02/2023 0830   HCT 36.6 07/10/2013 1121   PLT 245 08/02/2023 0830   PLT 177 07/10/2013 1121   MCV 91.8 08/02/2023 0830   MCV 82 07/10/2013 1121   MCH 29.6 08/02/2023 0830   MCHC 32.3 08/02/2023 0830   RDW 13.7 08/02/2023 0830   RDW 15.9 (H) 07/10/2013 1121   LYMPHSABS 1.6 08/02/2023 0830   LYMPHSABS 1.7 02/28/2013 1120   MONOABS 0.8 08/02/2023 0830   MONOABS 0.7 02/28/2013 1120   EOSABS 0.4 08/02/2023 0830   EOSABS 0.1 02/28/2013 1120   BASOSABS 0.0 08/02/2023 0830   BASOSABS 0.1 02/28/2013 1120   Recent Labs    11/16/22 0809 01/11/23 0821 03/08/23 0821 04/19/23 1556 05/03/23 0827  08/02/23 0830  HGB 14.8 14.5 14.5 14.4 14.6 14.1    CMP     Component Value Date/Time   NA 138 08/02/2023 0830   NA 139 07/10/2013 1121   K 5.4 (H) 08/02/2023 0830   K 3.9 07/10/2013 1121   CL 106 08/02/2023 0830   CL 108 (H) 07/10/2013 1121   CO2 26 08/02/2023 0830   CO2 28 07/10/2013 1121   GLUCOSE 93 08/02/2023 0830   GLUCOSE 86 07/10/2013 1121   BUN 17 08/02/2023 0830   BUN 15 07/10/2013 1121   CREATININE 0.70 08/02/2023 0830   CREATININE 0.81 07/10/2013 1121   CALCIUM 9.1 08/02/2023 0830   CALCIUM 8.9 07/10/2013 1121   PROT 6.1 (L) 08/02/2023 0830   ALBUMIN 3.8 08/02/2023 0830   AST 31 08/02/2023 0830   ALT 50 (H) 08/02/2023 0830   ALKPHOS 56 08/02/2023 0830   BILITOT 1.0 08/02/2023 0830   GFRNONAA >60 08/02/2023 0830   GFRNONAA >60 07/10/2013 1121   GFRAA >60 07/10/2013 1121      Latest Ref Rng & Units 08/02/2023    8:30 AM 04/19/2023    3:56 PM 01/11/2023    8:21 AM  Hepatic Function  Total Protein 6.5 - 8.1 g/dL 6.1  6.1  6.6   Albumin 3.5 - 5.0 g/dL 3.8  4.3  4.1   AST 15 - 41 U/L 31  20  19    ALT 0 - 44 U/L 50  24  27   Alk Phosphatase 38 - 126 U/L 56  46  70   Total Bilirubin 0.0 - 1.2 mg/dL 1.0  0.9  0.9       Review of Systems  All systems reviewed and negative except where noted in HPI.    Physical Exam  There were no vitals taken for this visit. No LMP recorded. Patient has had a hysterectomy. General:   Alert and oriented. Pleasant and cooperative. Well-nourished and well-developed.  Head:  Normocephalic and atraumatic. Eyes:  Without icterus Ears:  Normal auditory acuity. Neck:  Supple; no masses or thyromegaly. Lungs:  Respirations even and unlabored.  Clear throughout to auscultation.   No wheezes, crackles, or rhonchi. No acute distress. Heart:  Regular rate and rhythm; no murmurs, clicks, rubs, or gallops. Abdomen:  Normal bowel sounds.  No bruits.  Soft, non-tender and non-distended without masses, hepatosplenomegaly or hernias  noted.  No guarding or rebound tenderness.  ***Negative Carnett sign.   Rectal:  Deferred.***  Msk:  Symmetrical without gross deformities. Normal posture. Extremities:  Without edema. Neurologic:  Alert and  oriented x4;  grossly normal neurologically. Skin:  Intact without significant lesions or rashes. Psych:  Alert and cooperative. Normal mood and affect.   Assessment & Plan   Melinda Hall is a 63 y.o. female presenting today with    I discussed the assessment and treatment plan with the patient. The patient was provided an opportunity to ask questions and all were answered. The patient agreed with the plan and demonstrated an understanding of the instructions.   The patient was advised to call back or seek an in-person evaluation if the symptoms worsen or if the condition fails to improve as anticipated.  Melinda Bohr, DNP, AGNP-C [facility]

## 2023-11-01 ENCOUNTER — Encounter: Payer: Self-pay | Admitting: Oncology

## 2023-11-01 ENCOUNTER — Ambulatory Visit: Admitting: Family Medicine

## 2023-11-01 ENCOUNTER — Encounter: Payer: Self-pay | Admitting: Family Medicine

## 2023-11-01 VITALS — BP 114/82 | HR 71 | Temp 97.7°F | Ht 62.5 in | Wt 168.0 lb

## 2023-11-01 DIAGNOSIS — R1310 Dysphagia, unspecified: Secondary | ICD-10-CM | POA: Diagnosis not present

## 2023-11-01 NOTE — Patient Instructions (Signed)
 Barium Swallow scheduled 11/03/23 @ [facility] arrive @ 11:00 @ [facility] entrance.

## 2023-11-03 ENCOUNTER — Ambulatory Visit
Admission: RE | Admit: 2023-11-03 | Discharge: 2023-11-03 | Disposition: A | Discharge status: Home / Self Care | Source: Ambulatory Visit | Attending: Family Medicine | Admitting: Family Medicine

## 2023-11-03 DIAGNOSIS — R1310 Dysphagia, unspecified: Secondary | ICD-10-CM | POA: Diagnosis not present

## 2023-11-03 DIAGNOSIS — K224 Dyskinesia of esophagus: Secondary | ICD-10-CM | POA: Diagnosis not present

## 2023-11-06 ENCOUNTER — Ambulatory Visit: Payer: Self-pay | Admitting: Family Medicine

## 2023-11-06 NOTE — Telephone Encounter (Signed)
 Spoke with Melinda Hall regarding the below- she asked if she needed to continue taking the Omeprazole  and was told yes. Reminded to keep her appointment for November  and also reminded her EGD scheduled for December. Patient verbalized all the above.    Swallow study showed mild esophageal dysmotility, no evidence of focal mass or stricture.  No evidence of reflux or hiatal hernia.      Melinda Hall,  Can you please call the patient and let her know her swallow study did show mild esophageal dysmotility, but there were no masses or narrowing noted. Her esophagus does intermittently fail to generate the normal wave needed to clear swallowed food.  Please advise her to continue eating slowly and taking small bites.  We will continue with EGD as planned in December. Thanks DG ESOPHAGUS W SINGLE CM (SOL OR THIN BA)

## 2023-11-08 ENCOUNTER — Other Ambulatory Visit: Payer: Self-pay | Admitting: Family Medicine

## 2023-12-04 NOTE — Progress Notes (Unsigned)
 12/05/2023 Melinda Hall [id number] [birth year]  Gastroenterology Office Note    Referring Provider: Rilla Baller, MD Primary Care Physician:  Rilla Baller, MD  Primary GI Provider: Jinny Carmine, MD    Chief Complaint   Chief Complaint  Patient presents with   Follow-up    Continues to have difficulty swallowing on some days-other days she has no issues-     History of Present Illness   Melinda Hall is a 63 y.o. female with PMHX of follicular lymphoma, osteoporosis, gastric bypass 2004 (Roux-en-Y) presenting today for 1 month follow-up for dysphagia.  Patient reports she continues to have dysphagia intermittently. Some days she feels fine with swallowing and then some days it is difficult. If she sits down and eats it feels like food is getting stuck. She does try to eat small bites and smaller meals as she typically can't eat a large meal without food getting stuck. Rarely has nausea, denies abdominal pain or unintentional weight loss. She still will have to make herself vomit about 3 times a week if she has sensation of food stuck in the middle of her chest. She drinks a lot of water  daily. Denies NSAID or alcohol use. Has soft formed bowel movement 1-2 times daily. Denies melena or hematochezia.   EGD scheduled for 01/22/2024.  11/03/2023: swallow study  - Normal esophagus without evidence of focal mass or stricture. - No evidence of gastroesophageal reflux or hiatal hernia. - Esophageal motility: Mild esophageal dysmotility with intermittent loss of primary stripping wave.   05/17/2021 colonoscopy - One 4 mm polyp in the cecum, removed with a cold snare. Tubular adenoma, negative for high-grade dysplasia and malignancy - Two 4 to 6 mm polyps in the transverse colon, removed with a cold snare.  Sessile serrated polyps, negative for dysplasia and malignancy. - One 4 mm polyp in the descending colon, removed with a cold snare.  Sessile serrated polyp, negative  for dysplasia and malignancy - One 4 mm polyp in the sigmoid colon, removed with a cold snare. Tubular adenoma, negative for high-grade dysplasia and malignancy - Recommendation to repeat colonoscopy in 3 years  Past Medical History:  Diagnosis Date   Cancer (HCC)    Dermoid tumor    hx of--left ovary   Follicular lymphoma (HCC)    Gastric bypass status for obesity 2004   s/p roux-en-y   Generalized headaches    frequent   Heart murmur    History of cardiac murmur    Intraductal papilloma of breast, left 06/18/2017   05/2017 - s/p excision 07/2017   Lymphoma (HCC)    Osteoporosis 03/2016   DEXA -2.6 spine   Salivary gland stone    x 2   Smoker     Past Surgical History:  Procedure Laterality Date   ABDOMINAL HYSTERECTOMY     only ovary was removed; supracx   ANTERIOR CERVICAL DECOMP/DISCECTOMY FUSION  12/2016   Pool   APPENDECTOMY  1980   BILATERAL OOPHORECTOMY  ~1992   for dermoid ovarian cysts   BREAST BIOPSY Left 06/13/2017   papilloma with excision 07/2017   BREAST LUMPECTOMY WITH NEEDLE LOCALIZATION Left 08/10/2017   intraductal papilloma - Jordis Laneta FALCON, MD   CHOLECYSTECTOMY  2000s   COLONOSCOPY WITH PROPOFOL  N/A 11/27/2015   TA rpt 5 yrs Clair Jinny, MD)   COLONOSCOPY WITH PROPOFOL  N/A 05/17/2021   TAs, SSPs, rpt 3 yrs Renne, Darren, MD)   GASTRIC BYPASS  2004   roux en y [place])  POLYPECTOMY  11/27/2015   Procedure: POLYPECTOMY;  Surgeon: Rogelia Copping, MD;  [facility];  Service: Endoscopy;;   POLYPECTOMY  05/17/2021   Procedure: POLYPECTOMY;  Surgeon: Copping Rogelia, MD;  Location: [facility];  Service: Endoscopy;;   REDUCTION MAMMAPLASTY Bilateral 1990's   SALIVARY GLAND SURGERY     SALIVARY STONE REMOVAL      Current Outpatient Medications  Medication Sig Dispense Refill   alendronate  (FOSAMAX ) 70 MG tablet Take 70 mg by mouth once a week.     calcium carbonate (OS-CAL) 600 MG TABS tablet Take 600 mg by mouth daily with  breakfast.     Cholecalciferol  (VITAMIN D3) 1000 units CAPS Take 3 capsules (3,000 Units total) by mouth daily.     estradiol  (ESTRACE  VAGINAL) 0.1 MG/GM vaginal cream Insert 1 g vaginally nightly for 1 week, then 1-2 times wkly as maintenance 42.5 g 1   ferrous sulfate  325 (65 FE) MG tablet Take 1 tablet (325 mg total) by mouth every Monday, Wednesday, and Friday.     Magnesium  250 MG TABS Take 1 tablet (250 mg total) by mouth daily.  0   naproxen  (NAPROSYN ) 500 MG tablet TAKE 1 TABLET (500 MG TOTAL) BY MOUTH 2 (TWO) TIMES DAILY WITH A MEAL. FOR 1 WEEK THEN AS NEEDED 40 tablet 0   nitroGLYCERIN  (NITRODUR - DOSED IN MG/24 HR) 0.2 mg/hr patch APPLY 1/4 PATCH TO AFFECTED AREA AS DIRECTED BY MD AND CHANGE EVERY 24 HOURS. 90 patch 1   omeprazole  (PRILOSEC) 20 MG capsule TAKE 1 CAPSULE (20 MG TOTAL) BY MOUTH DAILY. SCHEDULED FOR 3 WEEKS THEN AS NEEDED 90 capsule 1   vitamin B-12 (CYANOCOBALAMIN ) 1000 MCG tablet Take 1 tablet (1,000 mcg total) by mouth daily.     zinc  gluconate 50 MG tablet Take 1 tablet (50 mg total) by mouth daily.     No current facility-administered medications for this visit.    Allergies as of 12/05/2023 - Review Complete 12/05/2023  Allergen Reaction Noted   Boniva [ibandronate] Other (See Comments) 09/11/2019    Family History  Problem Relation Age of Onset   Hypertension Mother    Cancer Father 85       leukemia   Cancer Brother 64       kidney   Diabetes Neg Hx    Stroke Neg Hx    Coronary artery disease Neg Hx    Breast cancer Neg Hx     Social History   Socioeconomic History   Marital status: Married    Spouse name: Not on file   Number of children: Not on file   Years of education: Not on file   Highest education level: Some college, no degree  Occupational History   Not on file  Tobacco Use   Smoking status: Former    Current packs/day: 0.00    Average packs/day: 0.3 packs/day for 23.3 years (6.0 ttl pk-yrs)    Types: Cigarettes    Start date:  10/31/1992    Quit date: 10/31/2012    Years since quitting: 11.1   Smokeless tobacco: Never  Vaping Use   Vaping status: Never Used  Substance and Sexual Activity   Alcohol use: Not Currently   Drug use: No   Sexual activity: Yes    Birth control/protection: Surgical    Comment: Hysterectomy  Other Topics Concern   Not on file  Social History Narrative   Caffeine: 2 cups coffee/day   Lives with husband Dinah) and daughter, 3  dogs, 1 ferret   Occupation: geologist, engineering at daycare   From [place]   Activity: no regular exercise, walks some at work   Diet: good water , fruits/vegetables daily   Social Drivers of Corporate Investment Banker Strain: Low Risk  (03/05/2023)   Overall Financial Resource Strain (CARDIA)    Difficulty of Paying Living Expenses: Not hard at all  Food Insecurity: No Food Insecurity (03/05/2023)   Hunger Vital Sign    Worried About Running Out of Food in the Last Year: Never true    Ran Out of Food in the Last Year: Never true  Transportation Needs: No Transportation Needs (03/05/2023)   PRAPARE - Administrator, Civil Service (Medical): No    Lack of Transportation (Non-Medical): No  Physical Activity: Sufficiently Active (03/05/2023)   Exercise Vital Sign    Days of Exercise per Week: 5 days    Minutes of Exercise per Session: 40 min  Stress: No Stress Concern Present (03/05/2023)   [employer] of Occupational Health - Occupational Stress Questionnaire    Feeling of Stress : Only a little  Social Connections: Moderately Isolated (03/05/2023)   Social Connection and Isolation Panel    Frequency of Communication with Friends and Family: More than three times a week    Frequency of Social Gatherings with Friends and Family: Once a week    Attends Religious Services: Never    Database Administrator or Organizations: No    Attends Banker Meetings: Not on file    Marital Status: Married  Intimate Partner Violence: Unknown  (04/05/2021)   Humiliation, Afraid, Rape, and Kick questionnaire    Fear of Current or Ex-Partner: No    Emotionally Abused: No    Physically Abused: No    Sexually Abused: Not on file     RELEVANT GI HISTORY, IMAGING AND LABS: CBC    Component Value Date/Time   WBC 7.4 08/02/2023 0830   RBC 4.76 08/02/2023 0830   HGB 14.1 08/02/2023 0830   HGB 11.8 (L) 07/10/2013 1121   HCT 43.7 08/02/2023 0830   HCT 36.6 07/10/2013 1121   PLT 245 08/02/2023 0830   PLT 177 07/10/2013 1121   MCV 91.8 08/02/2023 0830   MCV 82 07/10/2013 1121   MCH 29.6 08/02/2023 0830   MCHC 32.3 08/02/2023 0830   RDW 13.7 08/02/2023 0830   RDW 15.9 (H) 07/10/2013 1121   LYMPHSABS 1.6 08/02/2023 0830   LYMPHSABS 1.7 02/28/2013 1120   MONOABS 0.8 08/02/2023 0830   MONOABS 0.7 02/28/2013 1120   EOSABS 0.4 08/02/2023 0830   EOSABS 0.1 02/28/2013 1120   BASOSABS 0.0 08/02/2023 0830   BASOSABS 0.1 02/28/2013 1120   Recent Labs    01/11/23 0821 03/08/23 0821 04/19/23 1556 05/03/23 0827 08/02/23 0830  HGB 14.5 14.5 14.4 14.6 14.1    CMP     Component Value Date/Time   NA 138 08/02/2023 0830   NA 139 07/10/2013 1121   K 5.4 (H) 08/02/2023 0830   K 3.9 07/10/2013 1121   CL 106 08/02/2023 0830   CL 108 (H) 07/10/2013 1121   CO2 26 08/02/2023 0830   CO2 28 07/10/2013 1121   GLUCOSE 93 08/02/2023 0830   GLUCOSE 86 07/10/2013 1121   BUN 17 08/02/2023 0830   BUN 15 07/10/2013 1121   CREATININE 0.70 08/02/2023 0830   CREATININE 0.81 07/10/2013 1121   CALCIUM 9.1 08/02/2023 0830   CALCIUM 8.9 07/10/2013 1121   PROT  6.1 (L) 08/02/2023 0830   ALBUMIN 3.8 08/02/2023 0830   AST 31 08/02/2023 0830   ALT 50 (H) 08/02/2023 0830   ALKPHOS 56 08/02/2023 0830   BILITOT 1.0 08/02/2023 0830   GFRNONAA >60 08/02/2023 0830   GFRNONAA >60 07/10/2013 1121   GFRAA >60 07/10/2013 1121      Latest Ref Rng & Units 08/02/2023    8:30 AM 04/19/2023    3:56 PM 01/11/2023    8:21 AM  Hepatic Function  Total Protein  6.5 - 8.1 g/dL 6.1  6.1  6.6   Albumin 3.5 - 5.0 g/dL 3.8  4.3  4.1   AST 15 - 41 U/L 31  20  19    ALT 0 - 44 U/L 50  24  27   Alk Phosphatase 38 - 126 U/L 56  46  70   Total Bilirubin 0.0 - 1.2 mg/dL 1.0  0.9  0.9       Review of Systems   All systems reviewed and negative except where noted in HPI.    Physical Exam  BP 125/83   Pulse 66   Temp 98.1 F (36.7 C)   Ht 5' 2.5 (1.588 m)   Wt 168 lb 9.6 oz (76.5 kg)   SpO2 100%   BMI 30.35 kg/m  No LMP recorded. Patient has had a hysterectomy. General:   Alert and oriented. Pleasant and cooperative. Well-nourished and well-developed. NAD Head:  Normocephalic and atraumatic. Eyes:  Without icterus Ears:  Normal auditory acuity. Abdomen:  Normal bowel sounds.  No bruits.  Soft, non-tender and non-distended without masses, hepatosplenomegaly or hernias noted.  No guarding or rebound tenderness.   Rectal:  Deferred. Msk:  Symmetrical without gross deformities. Normal posture. Extremities:  Without edema. Neurologic:  Alert and  oriented x4;  grossly normal neurologically. Skin:  Intact without significant lesions or rashes. Psych:  Alert and cooperative. Normal mood and affect.   Assessment & Plan   Melinda Hall is a 63 y.o. female presenting today for follow up on dysphagia.    Dysphagia. Continues to have intermittent dysphagia, she has good and bad days.  She will have to make herself vomit 3 times a week if she feels like food is getting stuck in her chest. - discussed swallow study results of mild esophageal dysmotility. Continue to eat slowly and take small bites. Avoid dry, tough, or sticky foods. Stay upright 30 min after eating.  - continue omeprazole  20 mg once daily - EGD scheduled for 12/22. She is requesting to reschedule EGD as it close to holiday.  Patient also due for repeat colonoscopy in 2026, would like to schedule both procedures together if possible.    Follow up in 3  months  Grayce Bohr, DNP,  AGNP-[facility]

## 2023-12-05 ENCOUNTER — Encounter: Payer: Self-pay | Admitting: Family Medicine

## 2023-12-05 ENCOUNTER — Ambulatory Visit: Admitting: Family Medicine

## 2023-12-05 VITALS — BP 125/83 | HR 66 | Temp 98.1°F | Ht 62.5 in | Wt 168.6 lb

## 2023-12-05 DIAGNOSIS — R1319 Other dysphagia: Secondary | ICD-10-CM

## 2023-12-05 DIAGNOSIS — Z860100 Personal history of colon polyps, unspecified: Secondary | ICD-10-CM | POA: Diagnosis not present

## 2023-12-06 ENCOUNTER — Other Ambulatory Visit: Payer: Self-pay

## 2024-02-07 ENCOUNTER — Encounter: Payer: Self-pay | Admitting: Oncology

## 2024-02-08 ENCOUNTER — Other Ambulatory Visit: Payer: Self-pay

## 2024-02-08 ENCOUNTER — Encounter: Payer: Self-pay | Admitting: Oncology

## 2024-02-08 DIAGNOSIS — C8218 Follicular lymphoma grade II, lymph nodes of multiple sites: Secondary | ICD-10-CM

## 2024-02-09 ENCOUNTER — Ambulatory Visit
Admission: RE | Admit: 2024-02-09 | Discharge: 2024-02-09 | Disposition: A | Source: Ambulatory Visit | Attending: Oncology | Admitting: Oncology

## 2024-02-09 ENCOUNTER — Inpatient Hospital Stay: Attending: Oncology

## 2024-02-09 ENCOUNTER — Encounter: Payer: Self-pay | Admitting: Oncology

## 2024-02-09 DIAGNOSIS — C8218 Follicular lymphoma grade II, lymph nodes of multiple sites: Secondary | ICD-10-CM | POA: Diagnosis present

## 2024-02-09 LAB — CMP (CANCER CENTER ONLY)
ALT: 28 U/L (ref 0–44)
AST: 24 U/L (ref 15–41)
Albumin: 4.3 g/dL (ref 3.5–5.0)
Alkaline Phosphatase: 66 U/L (ref 38–126)
Anion gap: 10 (ref 5–15)
BUN: 20 mg/dL (ref 8–23)
CO2: 24 mmol/L (ref 22–32)
Calcium: 9.9 mg/dL (ref 8.9–10.3)
Chloride: 104 mmol/L (ref 98–111)
Creatinine: 0.81 mg/dL (ref 0.44–1.00)
GFR, Estimated: >60 mL/min
Glucose, Bld: 88 mg/dL (ref 70–99)
Potassium: 5.2 mmol/L — ABNORMAL HIGH (ref 3.5–5.1)
Sodium: 139 mmol/L (ref 135–145)
Total Bilirubin: 0.8 mg/dL (ref 0.0–1.2)
Total Protein: 6.6 g/dL (ref 6.5–8.1)

## 2024-02-09 LAB — CBC WITH DIFFERENTIAL (CANCER CENTER ONLY)
Abs Immature Granulocytes: 0.02 K/uL (ref 0.00–0.07)
Basophils Absolute: 0 K/uL (ref 0.0–0.1)
Basophils Relative: 1 %
Eosinophils Absolute: 0.1 K/uL (ref 0.0–0.5)
Eosinophils Relative: 1 %
HCT: 44 % (ref 36.0–46.0)
Hemoglobin: 14.3 g/dL (ref 12.0–15.0)
Immature Granulocytes: 0 %
Lymphocytes Relative: 19 %
Lymphs Abs: 1.5 K/uL (ref 0.7–4.0)
MCH: 28.9 pg (ref 26.0–34.0)
MCHC: 32.5 g/dL (ref 30.0–36.0)
MCV: 89.1 fL (ref 80.0–100.0)
Monocytes Absolute: 0.8 K/uL (ref 0.1–1.0)
Monocytes Relative: 9 %
Neutro Abs: 5.7 K/uL (ref 1.7–7.7)
Neutrophils Relative %: 70 %
Platelet Count: 231 K/uL (ref 150–400)
RBC: 4.94 MIL/uL (ref 3.87–5.11)
RDW: 14.2 % (ref 11.5–15.5)
WBC Count: 8.1 K/uL (ref 4.0–10.5)
nRBC: 0 % (ref 0.0–0.2)

## 2024-02-09 MED ORDER — IOHEXOL 300 MG/ML  SOLN
100.0000 mL | Freq: Once | INTRAMUSCULAR | Status: AC | PRN
  Administered 2024-02-09: 100 mL via INTRAVENOUS

## 2024-02-13 ENCOUNTER — Encounter: Payer: Self-pay | Admitting: Oncology

## 2024-02-16 ENCOUNTER — Inpatient Hospital Stay: Admitting: Oncology

## 2024-02-16 ENCOUNTER — Telehealth: Payer: Self-pay

## 2024-02-16 ENCOUNTER — Encounter: Payer: Self-pay | Admitting: Oncology

## 2024-02-16 VITALS — BP 128/72 | HR 67 | Temp 96.6°F | Resp 18 | Ht 62.5 in | Wt 168.0 lb

## 2024-02-16 DIAGNOSIS — C8218 Follicular lymphoma grade II, lymph nodes of multiple sites: Secondary | ICD-10-CM

## 2024-02-16 MED ORDER — PEG 3350-KCL-NA BICARB-NACL 420 G PO SOLR: 4000.0000 mL | Freq: Once | ORAL | 0 refills | Status: AC

## 2024-02-16 NOTE — Telephone Encounter (Signed)
 Prep requested to be sent to [facility] in [place].  Sent during call with patient.  Nulytely sent because Suprep not covered by insurance.  Instructions sent via mychart for Nulytely.  Thanks,  Rosaline CMA

## 2024-02-16 NOTE — Progress Notes (Signed)
 " [facility]  [phone] [phone]  ID: [name] OB: 12/06/1960  MR#: [id number]  RDW#:[id number]  Patient Care Team: Rilla Baller, MD as PCP - General (Family Medicine) Jacobo Evalene PARAS, MD as Consulting Physician (Oncology) Bluford Standing, MD as Consulting Physician (Neurosurgery)  CHIEF COMPLAINT: Recurrent stage III follicular lymphoma, grade 1/2.    INTERVAL HISTORY: Patient returns to clinic today for routine 33-month evaluation and discussion of her imaging results.  She has noticed increasing night sweats and hot flashes over the past week, but otherwise has felt well and is asymptomatic.  She denies any recent fevers or illnesses.  She has a good appetite and denies weight loss.  She has no neurologic complaints.  She does not complain of any weakness or fatigue today. She has no chest pain, shortness of breath, cough, or hemoptysis.  She denies any nausea, vomiting, constipation, or diarrhea.  She has no urinary complaints.  Patient offers no further specific complaints today.  REVIEW OF SYSTEMS:   Review of Systems  Constitutional:  Positive for diaphoresis. Negative for fever, malaise/fatigue and weight loss.  Respiratory: Negative.  Negative for cough, hemoptysis and shortness of breath.   Cardiovascular: Negative.  Negative for chest pain and leg swelling.  Gastrointestinal: Negative.  Negative for abdominal pain.  Genitourinary: Negative.  Negative for dysuria.  Musculoskeletal: Negative.  Negative for back pain and myalgias.  Skin: Negative.  Negative for rash.  Neurological: Negative.  Negative for tingling, sensory change, focal weakness, weakness and headaches.  Psychiatric/Behavioral: Negative.  Negative for depression. The patient is not nervous/anxious.     As per HPI. Otherwise, a complete review of systems is negative.  PAST MEDICAL HISTORY: Past Medical History:  Diagnosis Date   Cancer (HCC)    Dermoid  tumor    hx of--left ovary   Follicular lymphoma (HCC)    Gastric bypass status for obesity 2004   s/p roux-en-y   Generalized headaches    frequent   Heart murmur    History of cardiac murmur    Intraductal papilloma of breast, left 06/18/2017   05/2017 - s/p excision 07/2017   Lymphoma (HCC)    Osteoporosis 03/2016   DEXA -2.6 spine   Salivary gland stone    x 2   Smoker     PAST SURGICAL HISTORY: Past Surgical History:  Procedure Laterality Date   ABDOMINAL HYSTERECTOMY     only ovary was removed; supracx   ANTERIOR CERVICAL DECOMP/DISCECTOMY FUSION  12/2016   Pool   APPENDECTOMY  1980   BILATERAL OOPHORECTOMY  ~1992   for dermoid ovarian cysts   BREAST BIOPSY Left 06/13/2017   papilloma with excision 07/2017   BREAST LUMPECTOMY WITH NEEDLE LOCALIZATION Left 08/10/2017   intraductal papilloma - Jordis Laneta FALCON, MD   CHOLECYSTECTOMY  2000s   COLONOSCOPY WITH PROPOFOL  N/A 11/27/2015   TA rpt 5 yrs Clair Copping, MD)   COLONOSCOPY WITH PROPOFOL  N/A 05/17/2021   TAs, SSPs, rpt 3 yrs Renne, Darren, MD)   GASTRIC BYPASS  2004   roux en y [facility])   POLYPECTOMY  11/27/2015   Procedure: POLYPECTOMY;  Surgeon: Rogelia Copping, MD;  Location: [facility];  Service: Endoscopy;;   POLYPECTOMY  05/17/2021   Procedure: POLYPECTOMY;  Surgeon: Copping Rogelia, MD;  Location: [facility];  Service: Endoscopy;;   REDUCTION MAMMAPLASTY Bilateral 1990's   SALIVARY GLAND SURGERY     SALIVARY STONE REMOVAL      FAMILY HISTORY: Family  History  Problem Relation Age of Onset   Hypertension Mother    Cancer Father 49       leukemia   Cancer Brother 6       kidney   Diabetes Neg Hx    Stroke Neg Hx    Coronary artery disease Neg Hx    Breast cancer Neg Hx     ADVANCED DIRECTIVES (Y/N):  N  HEALTH MAINTENANCE: Social History   Tobacco Use   Smoking status: Former    Current packs/day: 0.00    Average packs/day: 0.3 packs/day for 23.3 years (6.0 ttl pk-yrs)    Types:  Cigarettes    Start date: 10/31/1992    Quit date: 10/31/2012    Years since quitting: 11.3   Smokeless tobacco: Never  Vaping Use   Vaping status: Never Used  Substance Use Topics   Alcohol use: Not Currently   Drug use: No     Colonoscopy:  PAP:  Bone density:  Lipid panel:  Allergies  Allergen Reactions   Boniva [Ibandronate] Other (See Comments)    Diffuse body aches    Current Outpatient Medications  Medication Sig Dispense Refill   alendronate  (FOSAMAX ) 70 MG tablet Take 70 mg by mouth once a week.     calcium carbonate (OS-CAL) 600 MG TABS tablet Take 600 mg by mouth daily with breakfast.     Cholecalciferol  (VITAMIN D3) 1000 units CAPS Take 3 capsules (3,000 Units total) by mouth daily.     estradiol  (ESTRACE  VAGINAL) 0.1 MG/GM vaginal cream Insert 1 g vaginally nightly for 1 week, then 1-2 times wkly as maintenance 42.5 g 1   ferrous sulfate  325 (65 FE) MG tablet Take 1 tablet (325 mg total) by mouth every Monday, Wednesday, and Friday.     Magnesium  250 MG TABS Take 1 tablet (250 mg total) by mouth daily.  0   naproxen  (NAPROSYN ) 500 MG tablet TAKE 1 TABLET (500 MG TOTAL) BY MOUTH 2 (TWO) TIMES DAILY WITH A MEAL. FOR 1 WEEK THEN AS NEEDED 40 tablet 0   nitroGLYCERIN  (NITRODUR - DOSED IN MG/24 HR) 0.2 mg/hr patch APPLY 1/4 PATCH TO AFFECTED AREA AS DIRECTED BY MD AND CHANGE EVERY 24 HOURS. 90 patch 1   omeprazole  (PRILOSEC) 20 MG capsule TAKE 1 CAPSULE (20 MG TOTAL) BY MOUTH DAILY. SCHEDULED FOR 3 WEEKS THEN AS NEEDED 90 capsule 1   vitamin B-12 (CYANOCOBALAMIN ) 1000 MCG tablet Take 1 tablet (1,000 mcg total) by mouth daily.     zinc  gluconate 50 MG tablet Take 1 tablet (50 mg total) by mouth daily.     No current facility-administered medications for this visit.    OBJECTIVE: Vitals:   02/16/24 1006  BP: 128/72  Pulse: 67  Resp: 18  Temp: (!) 96.6 F (35.9 C)  SpO2: 100%     Body mass index is 30.24 kg/m.    ECOG FS:0 - Asymptomatic  General:  Well-developed, well-nourished, no acute distress. Eyes: Pink conjunctiva, anicteric sclera. HEENT: Normocephalic, moist mucous membranes. Lungs: No audible wheezing or coughing. Heart: Regular rate and rhythm. Abdomen: Soft, nontender, no obvious distention. Musculoskeletal: No edema, cyanosis, or clubbing. Neuro: Alert, answering all questions appropriately. Cranial nerves grossly intact. Skin: No rashes or petechiae noted. Psych: Normal affect.  LAB RESULTS:  Lab Results  Component Value Date   NA 139 02/09/2024   K 5.2 (H) 02/09/2024   CL 104 02/09/2024   CO2 24 02/09/2024   GLUCOSE 88 02/09/2024   BUN 20  02/09/2024   CREATININE 0.81 02/09/2024   CALCIUM 9.9 02/09/2024   PROT 6.6 02/09/2024   ALBUMIN 4.3 02/09/2024   AST 24 02/09/2024   ALT 28 02/09/2024   ALKPHOS 66 02/09/2024   BILITOT 0.8 02/09/2024   GFRNONAA >60 02/09/2024   GFRAA >60 07/10/2013    Lab Results  Component Value Date   WBC 8.1 02/09/2024   NEUTROABS 5.7 02/09/2024   HGB 14.3 02/09/2024   HCT 44.0 02/09/2024   MCV 89.1 02/09/2024   PLT 231 02/09/2024     STUDIES: CT CHEST ABDOMEN PELVIS W CONTRAST Result Date: 02/13/2024 CLINICAL DATA:  Follow-up lymphoma, grade 2 follicular lymphoma * Tracking Code: BO * EXAM: CT CHEST, ABDOMEN, AND PELVIS WITH CONTRAST TECHNIQUE: Multidetector CT imaging of the chest, abdomen and pelvis was performed following the standard protocol during bolus administration of intravenous contrast. RADIATION DOSE REDUCTION: This exam was performed according to the departmental dose-optimization program which includes automated exposure control, adjustment of the mA and/or kV according to patient size and/or use of iterative reconstruction technique. CONTRAST:  OMNIPAQUE  IOHEXOL  300 MG/ML SOLN additional oral enteric contrast COMPARISON:  07/24/2023 FINDINGS: CT CHEST FINDINGS Cardiovascular: No significant vascular findings. Normal heart size. No pericardial effusion.  Mediastinum/Nodes: No enlarged mediastinal, hilar, or axillary lymph nodes. Thymic remnant or rebound in the anterior mediastinum. Thyroid  gland, trachea, and esophagus demonstrate no significant findings. Lungs/Pleura: Lungs are clear. No pleural effusion or pneumothorax. Musculoskeletal: No chest wall abnormality. No acute osseous findings. CT ABDOMEN PELVIS FINDINGS Hepatobiliary: No solid liver abnormality is seen. Cholecystectomy. Mild postoperative biliary ductal dilatation. Pancreas: Unremarkable. No pancreatic ductal dilatation or surrounding inflammatory changes. Spleen: Normal in size without significant abnormality. Adrenals/Urinary Tract: Adrenal glands are unremarkable. Kidneys are normal, without renal calculi, solid lesion, or hydronephrosis. Bladder is unremarkable. Stomach/Bowel: Roux gastric bypass. Appendix appears normal. No evidence of bowel wall thickening, distention, or inflammatory changes. Vascular/Lymphatic: No significant vascular findings are present. Unchanged treated retrocaval soft tissue at the level of the renal veins (series 3, image 60). No discretely enlarged abdominal or pelvic lymph nodes. Reproductive: No mass or other abnormality. Other: No abdominal wall hernia or abnormality. No ascites. Musculoskeletal: No acute osseous findings. IMPRESSION: 1. Unchanged treated retrocaval soft tissue at the level of the renal veins. No discretely enlarged lymph nodes in the chest, abdomen, or pelvis. 2. No splenomegaly. 3. Status post Roux gastric bypass and cholecystectomy. Electronically Signed   By: Marolyn JONETTA Jaksch M.D.   On: 02/13/2024 10:57    ONCOLOGY HISTORY: Retroperitoneal lymph node biopsy on April 21, 2020 confirmed the diagnosis. PET scan on April 14, 2020 revealed a hypermetabolic right sided retroperitoneal nodal mass along with hypermetabolic mesenteric adenopathy.  Patient also noted to have bilateral axillary adenopathy.  Although patient was asymptomatic and active  surveillance was offered, she elected to pursue treatment and received IV Rituxan  weekly x4 completing on May 28, 2020.  Repeat PET scan from August 24, 2020 reviewed independently with significant treatment response, although likely residual disease.  Patient previously declined maintenance Rituxan .   ASSESSMENT: Recurrent stage III follicular lymphoma, grade 1/2.    PLAN:    Recurrent stage III follicular lymphoma, grade 1/2: Previously, patient reinitiated treatment and received weekly Rituxan  x 4 between March 31, 2021 and April 21, 2021.  She then initiated maintenance Rituxan  every 8 weeks on Jun 23, 2021 and completed treatment on May 03, 2023.  Her most recent CT scan on February 09, 2024 reviewed independently with no obvious  evidence of recurrent or progressive disease.  Will continue routine imaging every 6 months for at least 2 years after completing treatment through April 2027 then transition to yearly imaging.  Return to clinic in 6 months with repeat imaging and further evaluation.   Hypermetabolic thyroid  nodule: Previously, biopsy was negative for malignancy. Depression: Significantly improved.  Continue current medications as prescribed. Night sweats/hot flashes: Unrelated to history of follicular lymphoma. Hyperkalemia: Chronic and unchanged.  Continue monitoring per primary care.   Patient expressed understanding and was in agreement with this plan. She also understands that She can call clinic at any time with any questions, concerns, or complaints.    Cancer Staging  Follicular lymphoma (HCC) Staging form: Hodgkin and Non-Hodgkin Lymphoma, AJCC 8th Edition - Clinical stage from 04/29/2020: Stage III (Follicular lymphoma) - Signed by Jacobo Evalene PARAS, MD on 04/29/2020 Stage prefix: Initial diagnosis   Evalene PARAS Jacobo, MD   02/16/2024 10:44 AM     "

## 2024-02-16 NOTE — Progress Notes (Signed)
 Patient is having severe hot flashes, especially at night. She is also feeling more tired and fatigued.

## 2024-02-17 ENCOUNTER — Other Ambulatory Visit: Payer: Self-pay

## 2024-02-20 ENCOUNTER — Encounter: Payer: Self-pay | Admitting: Oncology

## 2024-02-20 ENCOUNTER — Telehealth: Payer: Self-pay | Admitting: Oncology

## 2024-02-20 ENCOUNTER — Ambulatory Visit: Admitting: Anesthesiology

## 2024-02-20 ENCOUNTER — Encounter: Payer: Self-pay | Admitting: Gastroenterology

## 2024-02-20 ENCOUNTER — Ambulatory Visit
Admission: RE | Admit: 2024-02-20 | Discharge: 2024-02-20 | Disposition: A | Attending: Gastroenterology | Admitting: Gastroenterology

## 2024-02-20 ENCOUNTER — Encounter
Admission: RE | Disposition: A | Discharge status: Home / Self Care | Payer: Self-pay | Source: Home / Self Care | Attending: Gastroenterology

## 2024-02-20 ENCOUNTER — Other Ambulatory Visit: Payer: Self-pay

## 2024-02-20 DIAGNOSIS — R1310 Dysphagia, unspecified: Secondary | ICD-10-CM | POA: Diagnosis not present

## 2024-02-20 DIAGNOSIS — Z1211 Encounter for screening for malignant neoplasm of colon: Secondary | ICD-10-CM | POA: Diagnosis not present

## 2024-02-20 DIAGNOSIS — K640 First degree hemorrhoids: Secondary | ICD-10-CM | POA: Insufficient documentation

## 2024-02-20 DIAGNOSIS — D128 Benign neoplasm of rectum: Secondary | ICD-10-CM | POA: Diagnosis not present

## 2024-02-20 DIAGNOSIS — I38 Endocarditis, valve unspecified: Secondary | ICD-10-CM | POA: Diagnosis not present

## 2024-02-20 DIAGNOSIS — Z860101 Personal history of adenomatous and serrated colon polyps: Secondary | ICD-10-CM

## 2024-02-20 DIAGNOSIS — K635 Polyp of colon: Secondary | ICD-10-CM | POA: Diagnosis not present

## 2024-02-20 DIAGNOSIS — Z860100 Personal history of colon polyps, unspecified: Secondary | ICD-10-CM

## 2024-02-20 DIAGNOSIS — Z87891 Personal history of nicotine dependence: Secondary | ICD-10-CM | POA: Insufficient documentation

## 2024-02-20 DIAGNOSIS — Z9884 Bariatric surgery status: Secondary | ICD-10-CM | POA: Diagnosis not present

## 2024-02-20 HISTORY — PX: POLYPECTOMY: SHX149

## 2024-02-20 HISTORY — PX: ESOPHAGEAL DILATION: SHX303

## 2024-02-20 HISTORY — PX: ESOPHAGOGASTRODUODENOSCOPY: SHX5428

## 2024-02-20 HISTORY — PX: COLONOSCOPY: SHX5424

## 2024-02-20 MED ORDER — DEXMEDETOMIDINE HCL IN NACL 80 MCG/20ML IV SOLN
INTRAVENOUS | Status: DC | PRN
  Administered 2024-02-20: 8 ug via INTRAVENOUS
  Administered 2024-02-20: 12 ug via INTRAVENOUS

## 2024-02-20 MED ORDER — SODIUM CHLORIDE 0.9 % IV SOLN: INTRAVENOUS | Status: DC

## 2024-02-20 MED ORDER — PROPOFOL 10 MG/ML IV BOLUS
INTRAVENOUS | Status: DC | PRN
  Administered 2024-02-20 (×3): 50 mg via INTRAVENOUS

## 2024-02-20 MED ORDER — LIDOCAINE HCL (CARDIAC) PF 100 MG/5ML IV SOSY
PREFILLED_SYRINGE | INTRAVENOUS | Status: DC | PRN
  Administered 2024-02-20: 60 mg via INTRAVENOUS

## 2024-02-20 MED ORDER — LIDOCAINE HCL (PF) 2 % IJ SOLN
INTRAMUSCULAR | Status: AC
  Filled 2024-02-20: qty 5

## 2024-02-20 MED ORDER — PROPOFOL 500 MG/50ML IV EMUL
INTRAVENOUS | Status: DC | PRN
  Administered 2024-02-20: 75 ug/kg/min via INTRAVENOUS

## 2024-02-20 MED ORDER — GLYCOPYRROLATE 0.2 MG/ML IJ SOLN
INTRAMUSCULAR | Status: DC | PRN
  Administered 2024-02-20: .2 mg via INTRAVENOUS

## 2024-02-20 NOTE — Anesthesia Preprocedure Evaluation (Signed)
 "                                  Anesthesia Evaluation  Patient identified by MRN, date of birth, ID band Patient awake    Reviewed: Allergy & Precautions, H&P , NPO status , Patient's Chart, lab work & pertinent test results, reviewed documented beta blocker date and time   Airway Mallampati: II   Neck ROM: full    Dental  (+) Poor Dentition   Pulmonary neg pulmonary ROS, former smoker   Pulmonary exam normal        Cardiovascular Exercise Tolerance: Good Normal cardiovascular exam+ Valvular Problems/Murmurs  Rhythm:regular Rate:Normal     Neuro/Psych  Headaches PSYCHIATRIC DISORDERS  Depression     Neuromuscular disease    GI/Hepatic negative GI ROS, Neg liver ROS,,,  Endo/Other  negative endocrine ROS    Renal/GU negative Renal ROS  negative genitourinary   Musculoskeletal   Abdominal   Peds  Hematology negative hematology ROS (+)   Anesthesia Other Findings Past Medical History: No date: Cancer (HCC) No date: Dermoid tumor     Comment:  hx of--left ovary No date: Follicular lymphoma (HCC) 2004: Gastric bypass status for obesity     Comment:  s/p roux-en-y No date: Generalized headaches     Comment:  frequent No date: Heart murmur No date: History of cardiac murmur 06/18/2017: Intraductal papilloma of breast, left     Comment:  05/2017 - s/p excision 07/2017 No date: Lymphoma (HCC) 03/2016: Osteoporosis     Comment:  DEXA -2.6 spine No date: Salivary gland stone     Comment:  x 2 No date: Smoker Past Surgical History: No date: ABDOMINAL HYSTERECTOMY     Comment:  only ovary was removed; supracx 12/2016: ANTERIOR CERVICAL DECOMP/DISCECTOMY FUSION     Comment:  Pool 1980: APPENDECTOMY ~1992: BILATERAL OOPHORECTOMY     Comment:  for dermoid ovarian cysts 06/13/2017: BREAST BIOPSY; Left     Comment:  papilloma with excision 07/2017 08/10/2017: BREAST LUMPECTOMY WITH NEEDLE LOCALIZATION; Left     Comment:  intraductal papilloma -  Jordis Laneta FALCON, MD 2000s: CHOLECYSTECTOMY 11/27/2015: COLONOSCOPY WITH PROPOFOL ; N/A     Comment:  TA rpt 5 yrs Clair Copping, MD) 05/17/2021: COLONOSCOPY WITH PROPOFOL ; N/A     Comment:  TAs, SSPs, rpt 3 yrs Renne, Darren, MD) 2004: GASTRIC BYPASS     Comment:  roux en y [facility]) 11/27/2015: POLYPECTOMY     Comment:  Procedure: POLYPECTOMY;  Surgeon: Rogelia Copping, MD;                Location: [facility];  Service: Endoscopy;; 05/17/2021: POLYPECTOMY     Comment:  Procedure: POLYPECTOMY;  Surgeon: Copping Rogelia, MD;                Location: [facility];  Service: Endoscopy;; 1990's: REDUCTION MAMMAPLASTY; Bilateral No date: SALIVARY GLAND SURGERY No date: SALIVARY STONE REMOVAL   Reproductive/Obstetrics negative OB ROS                              Anesthesia Physical Anesthesia Plan  ASA: 3  Anesthesia Plan: General   Post-op Pain Management:    Induction:   PONV Risk Score and Plan:   Airway Management Planned:   Additional Equipment:   Intra-op Plan:   Post-operative Plan:   Informed Consent: I  have reviewed the patients History and Physical, chart, labs and discussed the procedure including the risks, benefits and alternatives for the proposed anesthesia with the patient or authorized representative who has indicated his/her understanding and acceptance.     Dental Advisory Given  Plan Discussed with: CRNA  Anesthesia Plan Comments:         Anesthesia Quick Evaluation  "

## 2024-02-20 NOTE — Transfer of Care (Signed)
 Immediate Anesthesia Transfer of Care Note  Patient: Melinda Hall  Procedure(s) Performed: EGD (ESOPHAGOGASTRODUODENOSCOPY) COLONOSCOPY DILATION, ESOPHAGUS POLYPECTOMY, INTESTINE  Patient Location: PACU  Anesthesia Type:General  Level of Consciousness: sedated  Airway & Oxygen Therapy: Patient Spontanous Breathing  Post-op Assessment: Report given to RN and Post -op Vital signs reviewed and stable  Post vital signs: Reviewed and stable  Last Vitals:  Vitals Value Taken Time  BP 94/73 02/20/24 10:02  Temp 35.4 C 02/20/24 10:02  Pulse 78 02/20/24 10:02  Resp 24 02/20/24 10:02  SpO2 96 % 02/20/24 10:02    Last Pain:  Vitals:   02/20/24 1002  TempSrc: Tympanic  PainSc: 0-No pain         Complications: No notable events documented.

## 2024-02-20 NOTE — Telephone Encounter (Signed)
 Called pt to sched CT - left vm - LH

## 2024-02-20 NOTE — Anesthesia Postprocedure Evaluation (Signed)
"   Anesthesia Post Note  Patient: Coda Filler  Procedure(s) Performed: EGD (ESOPHAGOGASTRODUODENOSCOPY) COLONOSCOPY DILATION, ESOPHAGUS POLYPECTOMY, INTESTINE  Patient location during evaluation: PACU Anesthesia Type: General Level of consciousness: awake and alert Pain management: pain level controlled Vital Signs Assessment: post-procedure vital signs reviewed and stable Respiratory status: spontaneous breathing, nonlabored ventilation, respiratory function stable and patient connected to nasal cannula oxygen Cardiovascular status: blood pressure returned to baseline and stable Postop Assessment: no apparent nausea or vomiting Anesthetic complications: no   No notable events documented.   Last Vitals:  Vitals:   02/20/24 0910 02/20/24 1002  BP: 126/87 94/73  Pulse: 62 78  Resp: 18 (!) 24  Temp: (!) 35.8 C (!) 35.4 C  SpO2: 99% 96%    Last Pain:  Vitals:   02/20/24 1002  TempSrc: Tympanic  PainSc: 0-No pain                 Lynwood KANDICE Clause      "

## 2024-02-20 NOTE — H&P (Signed)
 "  Melinda Copping, MD [facility] [address] [place], [place] [address] Phone:[phone] Fax : [phone]  Primary Care Physician:  Rilla Baller, MD Primary Gastroenterologist:  Dr. Copping  Pre-Procedure History & Physical: HPI:  Melinda Hall is a 64 y.o. female is here for an endoscopy and colonoscopy.   Past Medical History:  Diagnosis Date   Cancer (HCC)    Dermoid tumor    hx of--left ovary   Follicular lymphoma (HCC)    Gastric bypass status for obesity 2004   s/p roux-en-y   Generalized headaches    frequent   Heart murmur    History of cardiac murmur    Intraductal papilloma of breast, left 06/18/2017   05/2017 - s/p excision 07/2017   Lymphoma (HCC)    Osteoporosis 03/2016   DEXA -2.6 spine   Salivary gland stone    x 2   Smoker     Past Surgical History:  Procedure Laterality Date   ABDOMINAL HYSTERECTOMY     only ovary was removed; supracx   ANTERIOR CERVICAL DECOMP/DISCECTOMY FUSION  12/2016   Pool   APPENDECTOMY  1980   BILATERAL OOPHORECTOMY  ~1992   for dermoid ovarian cysts   BREAST BIOPSY Left 06/13/2017   papilloma with excision 07/2017   BREAST LUMPECTOMY WITH NEEDLE LOCALIZATION Left 08/10/2017   intraductal papilloma - Jordis Laneta FALCON, MD   CHOLECYSTECTOMY  2000s   COLONOSCOPY WITH PROPOFOL  N/A 11/27/2015   TA rpt 5 yrs Clair Copping, MD)   COLONOSCOPY WITH PROPOFOL  N/A 05/17/2021   TAs, SSPs, rpt 3 yrs Renne, Telly Broberg, MD)   GASTRIC BYPASS  2004   roux en y [facility])   POLYPECTOMY  11/27/2015   Procedure: POLYPECTOMY;  Surgeon: Melinda Copping, MD;  Location: [facility];  Service: Endoscopy;;   POLYPECTOMY  05/17/2021   Procedure: POLYPECTOMY;  Surgeon: Hall Rogelia, MD;  Location: [facility];  Service: Endoscopy;;   REDUCTION MAMMAPLASTY Bilateral 1990's   SALIVARY GLAND SURGERY     SALIVARY STONE REMOVAL      Prior to Admission medications  Medication Sig Start Date End Date Taking? Authorizing Provider   alendronate  (FOSAMAX ) 70 MG tablet Take 70 mg by mouth once a week. 11/24/23  Yes [provider]  calcium carbonate (OS-CAL) 600 MG TABS tablet Take 600 mg by mouth daily with breakfast.   Yes [provider]  Cholecalciferol  (VITAMIN D3) 1000 units CAPS Take 3 capsules (3,000 Units total) by mouth daily. 10/19/16  Yes Rilla Baller, MD  ferrous sulfate  325 (65 FE) MG tablet Take 1 tablet (325 mg total) by mouth every Monday, Wednesday, and Friday. 12/05/18  Yes Rilla Baller, MD  Magnesium  250 MG TABS Take 1 tablet (250 mg total) by mouth daily. 02/25/20  Yes Rilla Baller, MD  omeprazole  (PRILOSEC) 20 MG capsule TAKE 1 CAPSULE (20 MG TOTAL) BY MOUTH DAILY. SCHEDULED FOR 3 WEEKS THEN AS NEEDED 11/09/23  Yes Rilla Baller, MD  vitamin B-12 (CYANOCOBALAMIN ) 1000 MCG tablet Take 1 tablet (1,000 mcg total) by mouth daily. 10/22/16  Yes Rilla Baller, MD  zinc  gluconate 50 MG tablet Take 1 tablet (50 mg total) by mouth daily. 02/25/20  Yes Rilla Baller, MD  estradiol  (ESTRACE  VAGINAL) 0.1 MG/GM vaginal cream Insert 1 g vaginally nightly for 1 week, then 1-2 times wkly as maintenance 04/06/23   Copland, Alicia B, PA-C  naproxen  (NAPROSYN ) 500 MG tablet TAKE 1 TABLET (500 MG TOTAL) BY MOUTH 2 (TWO) TIMES DAILY WITH A  MEAL. FOR 1 WEEK THEN AS NEEDED 06/12/23   Copland, Jacques, MD  nitroGLYCERIN  (NITRODUR - DOSED IN MG/24 HR) 0.2 mg/hr patch APPLY 1/4 PATCH TO AFFECTED AREA AS DIRECTED BY MD AND CHANGE EVERY 24 HOURS. 10/20/23   Copland, Jacques, MD    Allergies as of 11/01/2023 - Review Complete 11/01/2023  Allergen Reaction Noted   Boniva [ibandronate] Other (See Comments) 09/11/2019    Family History  Problem Relation Age of Onset   Hypertension Mother    Cancer Father 56       leukemia   Cancer Brother 25       kidney   Diabetes Neg Hx    Stroke Neg Hx    Coronary artery disease Neg Hx    Breast cancer Neg Hx     Social History   Socioeconomic History    Marital status: Married    Spouse name: Not on file   Number of children: Not on file   Years of education: Not on file   Highest education level: Some college, no degree  Occupational History   Not on file  Tobacco Use   Smoking status: Former    Current packs/day: 0.00    Average packs/day: 0.3 packs/day for 23.3 years (6.0 ttl pk-yrs)    Types: Cigarettes    Start date: 10/31/1992    Quit date: 10/31/2012    Years since quitting: 11.3   Smokeless tobacco: Never  Vaping Use   Vaping status: Never Used  Substance and Sexual Activity   Alcohol use: Not Currently   Drug use: No   Sexual activity: Yes    Birth control/protection: Surgical    Comment: Hysterectomy  Other Topics Concern   Not on file  Social History Narrative   Caffeine: 2 cups coffee/day   Lives with husband Dinah) and daughter, 3 dogs, 1 ferret   Occupation: geologist, engineering at daycare   From [place]   Activity: no regular exercise, walks some at work   Diet: good water , fruits/vegetables daily   Social Drivers of Health   Tobacco Use: Medium Risk (02/20/2024)   Patient History    Smoking Tobacco Use: Former    Smokeless Tobacco Use: Never    Passive Exposure: Not on Actuary Strain: Low Risk (03/05/2023)   Overall Financial Resource Strain (CARDIA)    Difficulty of Paying Living Expenses: Not hard at all  Food Insecurity: No Food Insecurity (03/05/2023)   Hunger Vital Sign    Worried About Running Out of Food in the Last Year: Never true    Ran Out of Food in the Last Year: Never true  Transportation Needs: No Transportation Needs (03/05/2023)   PRAPARE - Administrator, Civil Service (Medical): No    Lack of Transportation (Non-Medical): No  Physical Activity: Sufficiently Active (03/05/2023)   Exercise Vital Sign    Days of Exercise per Week: 5 days    Minutes of Exercise per Session: 40 min  Stress: No Stress Concern Present (03/05/2023)   [employer] of  Occupational Health - Occupational Stress Questionnaire    Feeling of Stress : Only a little  Social Connections: Moderately Isolated (03/05/2023)   Social Connection and Isolation Panel    Frequency of Communication with Friends and Family: More than three times a week    Frequency of Social Gatherings with Friends and Family: Once a week    Attends Religious Services: Never    Database Administrator or Organizations: No  Attends Banker Meetings: Not on file    Marital Status: Married  Intimate Partner Violence: Unknown (04/05/2021)   Humiliation, Afraid, Rape, and Kick questionnaire    Fear of Current or Ex-Partner: No    Emotionally Abused: No    Physically Abused: No    Sexually Abused: Not on file  Depression (PHQ2-9): Medium Risk (10/17/2023)   Depression (PHQ2-9)    PHQ-2 Score: 5  Alcohol Screen: Low Risk (03/05/2023)   Alcohol Screen    Last Alcohol Screening Score (AUDIT): 1  Housing: Unknown (03/05/2023)   Housing Stability Vital Sign    Unable to Pay for Housing in the Last Year: No    Number of Times Moved in the Last Year: Not on file    Homeless in the Last Year: No  Utilities: Not on file  Health Literacy: Not on file    Review of Systems: See HPI, otherwise negative ROS  Physical Exam: BP 126/87   Pulse 62   Temp (!) 96.5 F (35.8 C) (Temporal)   Resp 18   Ht 5' 3 (1.6 m)   Wt 75.7 kg   SpO2 99%   BMI 29.55 kg/m  General:   Alert,  pleasant and cooperative in NAD Head:  Normocephalic and atraumatic. Neck:  Supple; no masses or thyromegaly. Lungs:  Clear throughout to auscultation.    Heart:  Regular rate and rhythm. Abdomen:  Soft, nontender and nondistended. Normal bowel sounds, without guarding, and without rebound.   Neurologic:  Alert and  oriented x4;  grossly normal neurologically.  Impression/Plan: Melinda Hall is here for an endoscopy and colonoscopy to be performed for a history of adenomatous polyps on 2023 and  dysphagia   Risks, benefits, limitations, and alternatives regarding  endoscopy and colonoscopy have been reviewed with the patient.  Questions have been answered.  All parties agreeable.   Melinda Copping, MD  02/20/2024, 9:26 AM "

## 2024-02-20 NOTE — Op Note (Signed)
 [facility] Gastroenterology Patient Name: Melinda Hall Procedure Date: 02/20/2024 9:23 AM MRN: [id number] Account #: [id number] Date of Birth: 03/28/1960 Admit Type: Outpatient Age: 64 Room: [facility] ENDO ROOM 4 Gender: Female Note Status: Finalized Instrument Name: Colon Scope [id number] Procedure:             Colonoscopy Indications:           High risk colon cancer surveillance: Personal history                         of colonic polyps Providers:             Rogelia Copping MD, MD Referring MD:          Rogelia Copping MD, MD (Referring MD), Anton Blas                         (Referring MD) Medicines:             Propofol  per Anesthesia Complications:         No immediate complications. Procedure:             Pre-Anesthesia Assessment:                        - Prior to the procedure, a History and Physical was                         performed, and patient medications and allergies were                         reviewed. The patient's tolerance of previous                         anesthesia was also reviewed. The risks and benefits                         of the procedure and the sedation options and risks                         were discussed with the patient. All questions were                         answered, and informed consent was obtained. Prior                         Anticoagulants: The patient has taken no anticoagulant                         or antiplatelet agents. ASA Grade Assessment: II - A                         patient with mild systemic disease. After reviewing                         the risks and benefits, the patient was deemed in                         satisfactory condition to undergo the procedure.  After obtaining informed consent, the colonoscope was                         passed under direct vision. Throughout the procedure,                         the patient's blood pressure, pulse, and oxygen                          saturations were monitored continuously. The                         Colonoscope was introduced through the anus and                         advanced to the the cecum, identified by appendiceal                         orifice and ileocecal valve. The colonoscopy was                         performed without difficulty. The patient tolerated                         the procedure well. The quality of the bowel                         preparation was excellent. Findings:      The perianal and digital rectal examinations were normal.      A 4 mm polyp was found in the rectum. The polyp was sessile. The polyp       was removed with a cold snare. Resection and retrieval were complete.      Non-bleeding internal hemorrhoids were found during retroflexion. The       hemorrhoids were Grade I (internal hemorrhoids that do not prolapse). Impression:            - One 4 mm polyp in the rectum, removed with a cold                         snare. Resected and retrieved.                        - Non-bleeding internal hemorrhoids. Recommendation:        - Discharge patient to home.                        - Resume previous diet.                        - Continue present medications.                        - Await pathology results.                        - Repeat colonoscopy in 7 years for surveillance. Procedure Code(s):     --- Professional ---                        [phone], Colonoscopy,  flexible; with removal of                         tumor(s), polyp(s), or other lesion(s) by snare                         technique Diagnosis Code(s):     --- Professional ---                        Z86.010, Personal history of colonic polyps                        D12.8, Benign neoplasm of rectum CPT copyright 2022 American Medical Association. All rights reserved. The codes documented in this report are preliminary and upon coder review may  be revised to meet current compliance requirements. Rogelia Copping MD,  MD 02/20/2024 10:00:35 AM This report has been signed electronically. Number of Addenda: 0 Note Initiated On: 02/20/2024 9:23 AM Scope Withdrawal Time: 0 hours 11 minutes 17 seconds  Total Procedure Duration: 0 hours 13 minutes 48 seconds  Estimated Blood Loss:  Estimated blood loss: none.      [facility]

## 2024-02-20 NOTE — Op Note (Signed)
 [facility] Patient Name: Melinda Hall Procedure Date: 02/20/2024 9:24 AM MRN: [id number] Account #: [id number] Date of Birth: 1960-06-19 Admit Type: Outpatient Age: 64 Room: [facility] ENDO ROOM 4 Gender: Female Note Status: Finalized Instrument Name: Endoscope [id number] Procedure:             Upper GI endoscopy Indications:           Dysphagia Providers:             Rogelia Copping MD, MD Referring MD:          Rogelia Copping MD, MD (Referring MD), Anton Blas                         (Referring MD) Medicines:             Propofol  per Anesthesia Complications:         No immediate complications. Procedure:             Pre-Anesthesia Assessment:                        - Prior to the procedure, a History and Physical was                         performed, and patient medications and allergies were                         reviewed. The patient's tolerance of previous                         anesthesia was also reviewed. The risks and benefits                         of the procedure and the sedation options and risks                         were discussed with the patient. All questions were                         answered, and informed consent was obtained. Prior                         Anticoagulants: The patient has taken no anticoagulant                         or antiplatelet agents. ASA Grade Assessment: II - A                         patient with mild systemic disease. After reviewing                         the risks and benefits, the patient was deemed in                         satisfactory condition to undergo the procedure.                        After obtaining informed consent, the endoscope was  passed under direct vision. Throughout the procedure,                         the patient's blood pressure, pulse, and oxygen                         saturations were monitored continuously. The Endoscope                          was introduced through the mouth, and advanced to the                         jejunum. The upper GI endoscopy was accomplished                         without difficulty. The patient tolerated the                         procedure well. Findings:      The examined esophagus was normal. A TTS dilator was passed through the       scope. Dilation with a 15-16.5-18 mm balloon dilator was performed to 18       mm.      Evidence of a gastric bypass was found. A gastric pouch with a normal       size was found. The gastrojejunal anastomosis was characterized by       healthy appearing mucosa.      The examined jejunum was normal. Impression:            - Normal esophagus. Dilated.                        - Gastric bypass with a normal-sized pouch.                         Gastrojejunal anastomosis characterized by healthy                         appearing mucosa.                        - Normal examined jejunum.                        - No specimens collected. Recommendation:        - Discharge patient to home.                        - Resume previous diet.                        - Continue present medications.                        - Perform a colonoscopy today. Procedure Code(s):     --- Professional ---                        [phone], Esophagogastroduodenoscopy, flexible,                         transoral; with transendoscopic balloon dilation of  esophagus (less than 30 mm diameter) Diagnosis Code(s):     --- Professional ---                        R13.10, Dysphagia, unspecified CPT copyright 2022 American Medical Association. All rights reserved. The codes documented in this report are preliminary and upon coder review may  be revised to meet current compliance requirements. Rogelia Copping MD, MD 02/20/2024 9:43:21 AM This report has been signed electronically. Number of Addenda: 0 Note Initiated On: 02/20/2024 9:24 AM Estimated Blood Loss:  Estimated blood loss:  none.      [facility]

## 2024-02-21 LAB — SURGICAL PATHOLOGY

## 2024-02-22 ENCOUNTER — Ambulatory Visit: Payer: Self-pay | Admitting: Gastroenterology

## 2024-02-22 ENCOUNTER — Encounter: Payer: Self-pay | Admitting: Family Medicine

## 2024-03-01 ENCOUNTER — Encounter: Payer: Self-pay | Admitting: Family Medicine

## 2024-03-04 ENCOUNTER — Telehealth: Payer: Self-pay | Admitting: Family Medicine

## 2024-03-04 NOTE — Telephone Encounter (Signed)
 With improved latest DEXA readings, will hold on medication at this time, reassess at CPE this year, likely update DEXA first.

## 2024-03-04 NOTE — Addendum Note (Signed)
 Addended by: RILLA BALLER on: 03/04/2024 10:49 AM   Modules accepted: Orders

## 2024-03-05 NOTE — Telephone Encounter (Signed)
 See my previous message

## 2024-03-06 NOTE — Telephone Encounter (Signed)
 Called pt and pt daughter stated that she will have pt call office back

## 2024-03-07 NOTE — Telephone Encounter (Signed)
 Called and schedule pt for Cpe / labs

## 2024-04-12 ENCOUNTER — Other Ambulatory Visit

## 2024-04-19 ENCOUNTER — Encounter: Admitting: Family Medicine

## 2024-05-01 ENCOUNTER — Ambulatory Visit

## 2024-08-13 ENCOUNTER — Other Ambulatory Visit

## 2024-08-13 ENCOUNTER — Inpatient Hospital Stay

## 2024-08-20 ENCOUNTER — Inpatient Hospital Stay: Admitting: Oncology
